# Patient Record
Sex: Female | Born: 1950
Health system: Southern US, Community
[De-identification: ages and names within clinical notes are randomized; demographics above are authoritative.]

## PROBLEM LIST (undated history)

## (undated) DIAGNOSIS — M069 Rheumatoid arthritis, unspecified: Secondary | ICD-10-CM

## (undated) DIAGNOSIS — E7800 Pure hypercholesterolemia, unspecified: Secondary | ICD-10-CM

## (undated) DIAGNOSIS — I639 Cerebral infarction, unspecified: Secondary | ICD-10-CM

## (undated) DIAGNOSIS — H547 Unspecified visual loss: Secondary | ICD-10-CM

## (undated) DIAGNOSIS — I10 Essential (primary) hypertension: Secondary | ICD-10-CM

## (undated) DIAGNOSIS — M797 Fibromyalgia: Secondary | ICD-10-CM

## (undated) DIAGNOSIS — E039 Hypothyroidism, unspecified: Secondary | ICD-10-CM

## (undated) DIAGNOSIS — M353 Polymyalgia rheumatica: Secondary | ICD-10-CM

## (undated) DIAGNOSIS — K219 Gastro-esophageal reflux disease without esophagitis: Secondary | ICD-10-CM

## (undated) DIAGNOSIS — C801 Malignant (primary) neoplasm, unspecified: Secondary | ICD-10-CM

## (undated) DIAGNOSIS — E119 Type 2 diabetes mellitus without complications: Secondary | ICD-10-CM

## (undated) DIAGNOSIS — G459 Transient cerebral ischemic attack, unspecified: Secondary | ICD-10-CM

## (undated) DIAGNOSIS — B029 Zoster without complications: Secondary | ICD-10-CM

## (undated) HISTORY — PX: JOINT REPLACEMENT: SHX530

## (undated) HISTORY — PX: CATARACT EXTRACTION W/ INTRAOCULAR LENS  IMPLANT, BILATERAL: SHX1307

## (undated) HISTORY — PX: OPEN REDUCTION LE FORT I FRACTURE: SHX2103

## (undated) HISTORY — PX: RHINOPLASTY: SUR1284

## (undated) HISTORY — DX: Cerebral infarction, unspecified: I63.9

## (undated) HISTORY — PX: VAGINAL HYSTERECTOMY: SUR661

---

## 1999-05-11 ENCOUNTER — Ambulatory Visit (HOSPITAL_COMMUNITY): Admission: RE | Admit: 1999-05-11 | Discharge: 1999-05-11 | Payer: Self-pay | Admitting: Specialist

## 1999-06-10 ENCOUNTER — Ambulatory Visit (HOSPITAL_COMMUNITY): Admission: RE | Admit: 1999-06-10 | Discharge: 1999-06-10 | Payer: Self-pay | Admitting: Specialist

## 2006-11-16 ENCOUNTER — Encounter: Admission: RE | Admit: 2006-11-16 | Discharge: 2006-11-16 | Payer: Self-pay | Admitting: Family Medicine

## 2011-06-04 ENCOUNTER — Emergency Department (HOSPITAL_COMMUNITY)
Admission: EM | Admit: 2011-06-04 | Discharge: 2011-06-04 | Disposition: A | Discharge status: Home / Self Care | Payer: Self-pay | Attending: Emergency Medicine | Admitting: Emergency Medicine

## 2011-06-04 ENCOUNTER — Inpatient Hospital Stay (INDEPENDENT_AMBULATORY_CARE_PROVIDER_SITE_OTHER)
Admission: RE | Admit: 2011-06-04 | Discharge: 2011-06-04 | Disposition: A | Discharge status: Home / Self Care | Payer: Self-pay | Source: Ambulatory Visit | Attending: Family Medicine | Admitting: Family Medicine

## 2011-06-04 DIAGNOSIS — I10 Essential (primary) hypertension: Secondary | ICD-10-CM

## 2011-06-04 DIAGNOSIS — IMO0001 Reserved for inherently not codable concepts without codable children: Secondary | ICD-10-CM

## 2011-06-04 DIAGNOSIS — M19019 Primary osteoarthritis, unspecified shoulder: Secondary | ICD-10-CM | POA: Insufficient documentation

## 2011-06-04 DIAGNOSIS — M25519 Pain in unspecified shoulder: Secondary | ICD-10-CM | POA: Insufficient documentation

## 2011-06-04 DIAGNOSIS — M25559 Pain in unspecified hip: Secondary | ICD-10-CM | POA: Insufficient documentation

## 2011-06-04 LAB — POCT URINALYSIS DIP (DEVICE)
Glucose, UA: NEGATIVE mg/dL
Ketones, ur: NEGATIVE mg/dL
Specific Gravity, Urine: 1.02 (ref 1.005–1.030)
Urobilinogen, UA: 0.2 mg/dL (ref 0.0–1.0)

## 2011-06-04 LAB — POCT I-STAT, CHEM 8
BUN: 14 mg/dL (ref 6–23)
Chloride: 106 mEq/L (ref 96–112)
Creatinine, Ser: 0.7 mg/dL (ref 0.50–1.10)
Glucose, Bld: 146 mg/dL — ABNORMAL HIGH (ref 70–99)
Potassium: 3.5 mEq/L (ref 3.5–5.1)

## 2011-07-23 ENCOUNTER — Other Ambulatory Visit: Payer: Self-pay | Admitting: Specialist

## 2011-07-23 DIAGNOSIS — H539 Unspecified visual disturbance: Secondary | ICD-10-CM

## 2011-07-28 ENCOUNTER — Other Ambulatory Visit: Payer: Self-pay

## 2011-08-04 ENCOUNTER — Ambulatory Visit
Admission: RE | Admit: 2011-08-04 | Discharge: 2011-08-04 | Disposition: A | Discharge status: Home / Self Care | Payer: No Typology Code available for payment source | Source: Ambulatory Visit | Attending: Specialist | Admitting: Specialist

## 2011-08-04 DIAGNOSIS — H539 Unspecified visual disturbance: Secondary | ICD-10-CM

## 2012-01-12 ENCOUNTER — Emergency Department (HOSPITAL_COMMUNITY)
Admission: EM | Admit: 2012-01-12 | Discharge: 2012-01-13 | Disposition: A | Discharge status: Home / Self Care | Payer: Self-pay | Attending: Emergency Medicine | Admitting: Emergency Medicine

## 2012-01-12 ENCOUNTER — Encounter (HOSPITAL_COMMUNITY): Payer: Self-pay | Admitting: Emergency Medicine

## 2012-01-12 DIAGNOSIS — R0609 Other forms of dyspnea: Secondary | ICD-10-CM | POA: Insufficient documentation

## 2012-01-12 DIAGNOSIS — R0600 Dyspnea, unspecified: Secondary | ICD-10-CM

## 2012-01-12 DIAGNOSIS — I10 Essential (primary) hypertension: Secondary | ICD-10-CM | POA: Insufficient documentation

## 2012-01-12 DIAGNOSIS — R0989 Other specified symptoms and signs involving the circulatory and respiratory systems: Secondary | ICD-10-CM | POA: Insufficient documentation

## 2012-01-12 HISTORY — DX: Zoster without complications: B02.9

## 2012-01-12 HISTORY — DX: Essential (primary) hypertension: I10

## 2012-01-12 NOTE — ED Notes (Signed)
PT. REPORTS SOB THIS EVENING WITH HYPERTENSION = 254/153 , DENIES CHEST PAIN OR NAUSEA.

## 2012-01-13 ENCOUNTER — Emergency Department (HOSPITAL_COMMUNITY): Payer: Self-pay

## 2012-01-13 LAB — CBC
HCT: 44.1 % (ref 36.0–46.0)
Hemoglobin: 14.6 g/dL (ref 12.0–15.0)
MCH: 28.3 pg (ref 26.0–34.0)
MCV: 85.6 fL (ref 78.0–100.0)
RBC: 5.15 MIL/uL — ABNORMAL HIGH (ref 3.87–5.11)
WBC: 15.1 10*3/uL — ABNORMAL HIGH (ref 4.0–10.5)

## 2012-01-13 LAB — BASIC METABOLIC PANEL
BUN: 12 mg/dL (ref 6–23)
CO2: 25 mEq/L (ref 19–32)
Calcium: 9.5 mg/dL (ref 8.4–10.5)
GFR calc non Af Amer: 90 mL/min — ABNORMAL LOW (ref >90)
Glucose, Bld: 210 mg/dL — ABNORMAL HIGH (ref 70–99)

## 2012-01-13 LAB — DIFFERENTIAL
Eosinophils Absolute: 0.1 10*3/uL (ref 0.0–0.7)
Eosinophils Relative: 1 % (ref 0–5)
Lymphocytes Relative: 21 % (ref 12–46)
Lymphs Abs: 3.2 10*3/uL (ref 0.7–4.0)
Monocytes Relative: 5 % (ref 3–12)

## 2012-01-13 MED ORDER — POTASSIUM CHLORIDE CRYS ER 20 MEQ PO TBCR
40.0000 meq | EXTENDED_RELEASE_TABLET | Freq: Once | ORAL | Status: AC
  Administered 2012-01-13: 40 meq via ORAL
  Filled 2012-01-13: qty 2

## 2012-01-13 NOTE — ED Notes (Signed)
Pt. Alert and oriented, respirations even and regular, pt. Discharged to home, NAD noted

## 2012-01-13 NOTE — Discharge Instructions (Signed)
Arterial Hypertension Arterial hypertension (high blood pressure) is a condition of elevated pressure in your blood vessels. Hypertension over a long period of time is a risk factor for strokes, heart attacks, and heart failure. It is also the leading cause of kidney (renal) failure.  CAUSES   In Adults -- Over 90% of all hypertension has no known cause. This is called essential or primary hypertension. In the other 10% of people with hypertension, the increase in blood pressure is caused by another disorder. This is called secondary hypertension. Important causes of secondary hypertension are:   Heavy alcohol use.   Obstructive sleep apnea.   Hyperaldosterosim (Conn's syndrome).   Steroid use.   Chronic kidney failure.   Hyperparathyroidism.   Medications.   Renal artery stenosis.   Pheochromocytoma.   Cushing's disease.   Coarctation of the aorta.   Scleroderma renal crisis.   Licorice (in excessive amounts).   Drugs (cocaine, methamphetamine).  Your caregiver can explain any items above that apply to you.  In Children -- Secondary hypertension is more common and should always be considered.   Pregnancy -- Few women of childbearing age have high blood pressure. However, up to 10% of them develop hypertension of pregnancy. Generally, this will not harm the woman. It may be a sign of 3 complications of pregnancy: preeclampsia, HELLP syndrome, and eclampsia. Follow up and control with medication is necessary.  SYMPTOMS   This condition normally does not produce any noticeable symptoms. It is usually found during a routine exam.   Malignant hypertension is a late problem of high blood pressure. It may have the following symptoms:   Headaches.   Blurred vision.   End-organ damage (this means your kidneys, heart, lungs, and other organs are being damaged).   Stressful situations can increase the blood pressure. If a person with normal blood pressure has their blood  pressure go up while being seen by their caregiver, this is often termed "white coat hypertension." Its importance is not known. It may be related with eventually developing hypertension or complications of hypertension.   Hypertension is often confused with mental tension, stress, and anxiety.  DIAGNOSIS  The diagnosis is made by 3 separate blood pressure measurements. They are taken at least 1 week apart from each other. If there is organ damage from hypertension, the diagnosis may be made without repeat measurements. Hypertension is usually identified by having blood pressure readings:  Above 140/90 mmHg measured in both arms, at 3 separate times, over a couple weeks.   Over 130/80 mmHg should be considered a risk factor and may require treatment in patients with diabetes.  Blood pressure readings over 120/80 mmHg are called "pre-hypertension" even in non-diabetic patients. To get a true blood pressure measurement, use the following guidelines. Be aware of the factors that can alter blood pressure readings.  Take measurements at least 1 hour after caffeine.   Take measurements 30 minutes after smoking and without any stress. This is another reason to quit smoking - it raises your blood pressure.   Use a proper cuff size. Ask your caregiver if you are not sure about your cuff size.   Most home blood pressure cuffs are automatic. They will measure systolic and diastolic pressures. The systolic pressure is the pressure reading at the start of sounds. Diastolic pressure is the pressure at which the sounds disappear. If you are elderly, measure pressures in multiple postures. Try sitting, lying or standing.   Sit at rest for a minimum of   5 minutes before taking measurements.   You should not be on any medications like decongestants. These are found in many cold medications.   Record your blood pressure readings and review them with your caregiver.  If you have hypertension:  Your caregiver  may do tests to be sure you do not have secondary hypertension (see "causes" above).   Your caregiver may also look for signs of metabolic syndrome. This is also called Syndrome X or Insulin Resistance Syndrome. You may have this syndrome if you have type 2 diabetes, abdominal obesity, and abnormal blood lipids in addition to hypertension.   Your caregiver will take your medical and family history and perform a physical exam.   Diagnostic tests may include blood tests (for glucose, cholesterol, potassium, and kidney function), a urinalysis, or an EKG. Other tests may also be necessary depending on your condition.  PREVENTION  There are important lifestyle issues that you can adopt to reduce your chance of developing hypertension:  Maintain a normal weight.   Limit the amount of salt (sodium) in your diet.   Exercise often.   Limit alcohol intake.   Get enough potassium in your diet. Discuss specific advice with your caregiver.   Follow a DASH diet (dietary approaches to stop hypertension). This diet is rich in fruits, vegetables, and low-fat dairy products, and avoids certain fats.  PROGNOSIS  Essential hypertension cannot be cured. Lifestyle changes and medical treatment can lower blood pressure and reduce complications. The prognosis of secondary hypertension depends on the underlying cause. Many people whose hypertension is controlled with medicine or lifestyle changes can live a normal, healthy life.  RISKS AND COMPLICATIONS  While high blood pressure alone is not an illness, it often requires treatment due to its short- and long-term effects on many organs. Hypertension increases your risk for:  CVAs or strokes (cerebrovascular accident).   Heart failure due to chronically high blood pressure (hypertensive cardiomyopathy).   Heart attack (myocardial infarction).   Damage to the retina (hypertensive retinopathy).   Kidney failure (hypertensive nephropathy).  Your caregiver can  explain list items above that apply to you. Treatment of hypertension can significantly reduce the risk of complications. TREATMENT   For overweight patients, weight loss and regular exercise are recommended. Physical fitness lowers blood pressure.   Mild hypertension is usually treated with diet and exercise. A diet rich in fruits and vegetables, fat-free dairy products, and foods low in fat and salt (sodium) can help lower blood pressure. Decreasing salt intake decreases blood pressure in a 1/3 of people.   Stop smoking if you are a smoker.  The steps above are highly effective in reducing blood pressure. While these actions are easy to suggest, they are difficult to achieve. Most patients with moderate or severe hypertension end up requiring medications to bring their blood pressure down to a normal level. There are several classes of medications for treatment. Blood pressure pills (antihypertensives) will lower blood pressure by their different actions. Lowering the blood pressure by 10 mmHg may decrease the risk of complications by as much as 25%. The goal of treatment is effective blood pressure control. This will reduce your risk for complications. Your caregiver will help you determine the best treatment for you according to your lifestyle. What is excellent treatment for one person, may not be for you. HOME CARE INSTRUCTIONS   Do not smoke.   Follow the lifestyle changes outlined in the "Prevention" section.   If you are on medications, follow the directions   carefully. Blood pressure medications must be taken as prescribed. Skipping doses reduces their benefit. It also puts you at risk for problems.   Follow up with your caregiver, as directed.   If you are asked to monitor your blood pressure at home, follow the guidelines in the "Diagnosis" section above.  SEEK MEDICAL CARE IF:   You think you are having medication side effects.   You have recurrent headaches or lightheadedness.     You have swelling in your ankles.   You have trouble with your vision.  SEEK IMMEDIATE MEDICAL CARE IF:   You have sudden onset of chest pain or pressure, difficulty breathing, or other symptoms of a heart attack.   You have a severe headache.   You have symptoms of a stroke (such as sudden weakness, difficulty speaking, difficulty walking).  MAKE SURE YOU:   Understand these instructions.   Will watch your condition.   Will get help right away if you are not doing well or get worse.  Document Released: 07/26/2005 Document Revised: 07/15/2011 Document Reviewed: 02/23/2007 [employer] Patient Information 2012 [employer]. 

## 2012-01-13 NOTE — ED Provider Notes (Addendum)
History     CSN: [id number]  Arrival date & time 01/12/12  2341   First MD Initiated Contact with Patient 01/13/12 0124      Chief Complaint  Patient presents with  . Shortness of Breath    (Consider location/radiation/quality/duration/timing/severity/associated sxs/prior treatment) Patient is a 61 y.o. female presenting with shortness of breath. The history is provided by the patient.  Shortness of Breath  Associated symptoms include shortness of breath.   patient here with shortness of breath and high blood pressure is that this evening. She has been having allergic reaction to Vicodin with pruritus. She took her blood pressure at home at systolic was 250. As her blood pressure decreased she felt better. Denies any chest pain or chest pressure. No diaphoresis. No recent travel history or leg pain. No recent history of fevers. She feels back to her baseline at this time  Past Medical History  Diagnosis Date  . Hypertension   . Shingles     Past Surgical History  Procedure Date  . Abdominal hysterectomy   . Rhinoplasty     No family history on file.  History  Substance Use Topics  . Smoking status: Current Everyday Smoker  . Smokeless tobacco: Not on file  . Alcohol Use: No    OB History    Grav Para Term Preterm Abortions TAB SAB Ect Mult Living                  Review of Systems  Respiratory: Positive for shortness of breath.   All other systems reviewed and are negative.    Allergies  Review of patient's allergies indicates not on file.  Home Medications  No current outpatient prescriptions on file.  BP 194/97  Pulse 100  Temp(Src) 98.4 F (36.9 C) (Oral)  Resp 18  SpO2 97%  Physical Exam  Nursing note and vitals reviewed. Constitutional: She is oriented to person, place, and time. She appears well-developed and well-nourished.  Non-toxic appearance. No distress.  HENT:  Head: Normocephalic and atraumatic.  Eyes: Conjunctivae, EOM and lids are  normal. Pupils are equal, round, and reactive to light.  Neck: Normal range of motion. Neck supple. No tracheal deviation present. No mass present.  Cardiovascular: Normal rate, regular rhythm and normal heart sounds.  Exam reveals no gallop.   No murmur heard. Pulmonary/Chest: Effort normal and breath sounds normal. No stridor. No respiratory distress. She has no decreased breath sounds. She has no wheezes. She has no rhonchi. She has no rales.  Abdominal: Soft. Normal appearance and bowel sounds are normal. She exhibits no distension. There is no tenderness. There is no rebound and no CVA tenderness.  Musculoskeletal: Normal range of motion. She exhibits no edema and no tenderness.  Neurological: She is alert and oriented to person, place, and time. She has normal strength. No cranial nerve deficit or sensory deficit. GCS eye subscore is 4. GCS verbal subscore is 5. GCS motor subscore is 6.  Skin: Skin is warm and dry. No abrasion and no rash noted.  Psychiatric: She has a normal mood and affect. Her speech is normal and behavior is normal.    ED Course  Procedures (including critical care time)  Labs Reviewed  CBC - Abnormal; Notable for the following:    WBC 15.1 (*)    RBC 5.15 (*)    All other components within normal limits  DIFFERENTIAL - Abnormal; Notable for the following:    Neutro Abs 11.0 (*)    All  other components within normal limits  BASIC METABOLIC PANEL - Abnormal; Notable for the following:    Potassium 3.1 (*)    Glucose, Bld 210 (*)    GFR calc non Af Amer 90 (*)    All other components within normal limits  PRO B NATRIURETIC PEPTIDE   Dg Chest 2 View  01/13/2012  *RADIOLOGY REPORT*  Clinical Data: Shortness of breath.  CHEST - 2 VIEW  Comparison: No priors.  Findings: Lungs appear borderline hyperexpanded. No consolidative airspace disease.   Mild pulmonary venous congestion without frank pulmonary edema.  Heart size is within normal limits. Atherosclerotic  calcifications within the arch of the aorta.  No definite pleural effusions.  IMPRESSION: 1.  Mild pulmonary venous congestion without frank pulmonary edema. 2.  Atherosclerosis.  Original Report Authenticated By: Florencia Reasons, M.D.     No diagnosis found.    MDM  Suspect that patient had transient increase in her blood pressure and this was the cause of her shortness of breath which has since resolved. No concern for pulmonary embolism. She is stable for discharged    Date: 04/08/2012  Rate: 102  Rhythm: sinus tachycardia  QRS Axis: normal  Intervals: normal  ST/T Wave abnormalities: nonspecific ST changes  Conduction Disutrbances:none  Narrative Interpretation:   Old EKG Reviewed: unchanged       Toy Baker, MD 01/13/12 1610  Toy Baker, MD 04/08/12 2337

## 2015-08-13 NOTE — H&P (Signed)
TOTAL KNEE ADMISSION H&P  Patient is being admitted for right total knee arthroplasty.  Subjective:  Chief Complaint:   Right knee primary OA / pain  HPI: Misty Morris, 65 y.o. female, has a history of pain and functional disability in the right knee due to arthritis and has failed non-surgical conservative treatments for greater than 12 weeks to includeNSAID's and/or analgesics, corticosteriod injections, viscosupplementation injections, use of assistive devices and activity modification.  Onset of symptoms was gradual, starting 1.5+ years ago with gradually worsening course since that time. The patient noted no past surgery on the right knee(s).  Patient currently rates pain in the right knee(s) at 9 out of 10 with activity. Patient has night pain, worsening of pain with activity and weight bearing, pain that interferes with activities of daily living, pain with passive range of motion, crepitus and joint swelling.  Patient has evidence of periarticular osteophytes and joint space narrowing by imaging studies.  There is no active infection.   Risks, benefits and expectations were discussed with the patient.  Risks including but not limited to the risk of anesthesia, blood clots, nerve damage, blood vessel damage, failure of the prosthesis, infection and up to and including death.  Patient understand the risks, benefits and expectations and wishes to proceed with surgery.   PCP: Donnie Coffin, MD  D/C Plans:      Home with HHPT  (mother's house)  Post-op Meds:       No Rx given  Tranexamic Acid:      To be given - IV   Decadron:      Is to be given  FYI:     ASA  Norco  Dilaudid IV  (ok per the patient)    Past Medical History  Diagnosis Date  . Hypertension   . Shingles     Past Surgical History  Procedure Laterality Date  . Abdominal hysterectomy    . Rhinoplasty      No prescriptions prior to admission   Allergies  Allergen Reactions  . Latex Other (See Comments)   Blistering   . Codeine Hives  . Demerol [Meperidine] Hives  . Morphine And Related Hives  . Sulfa Antibiotics Hives  . Vicodin [Hydrocodone-Acetaminophen] Itching    Elevated blood pressure    Social History  Substance Use Topics  . Smoking status: Current Every Day Smoker  . Smokeless tobacco: Not on file  . Alcohol Use: No       Review of Systems  Constitutional: Positive for malaise/fatigue.  HENT: Negative.   Eyes: Negative.   Respiratory: Negative.   Cardiovascular: Negative.   Gastrointestinal: Negative.   Genitourinary: Negative.   Musculoskeletal: Positive for myalgias and joint pain.  Skin: Negative.   Neurological: Negative.   Endo/Heme/Allergies: Negative.   Psychiatric/Behavioral: Negative.     Objective:  Physical Exam  Constitutional: She is oriented to person, place, and time. She appears well-developed.  HENT:  Head: Normocephalic.  Mouth/Throat: She has dentures.  Eyes: Pupils are equal, round, and reactive to light.  Neck: Neck supple. No JVD present. No tracheal deviation present. No thyromegaly present.  Cardiovascular: Normal rate, regular rhythm, normal heart sounds and intact distal pulses.   Respiratory: Effort normal and breath sounds normal. No stridor. No respiratory distress. She has no wheezes.  GI: Soft. There is no tenderness. There is no guarding.  Musculoskeletal:       Right knee: She exhibits decreased range of motion, swelling and bony tenderness. She exhibits no  ecchymosis, no deformity, no laceration and no erythema. Tenderness found.  Lymphadenopathy:    She has no cervical adenopathy.  Neurological: She is alert and oriented to person, place, and time.  Skin: Skin is warm and dry.  Psychiatric: She has a normal mood and affect.      Imaging Review Plain radiographs demonstrate severe degenerative joint disease of the right knee(s). The overall alignment is mild varus. The bone quality appears to be good for age and  reported activity level.  Assessment/Plan:  End stage arthritis, right knee   The patient history, physical examination, clinical judgment of the provider and imaging studies are consistent with end stage degenerative joint disease of the right knee(s) and total knee arthroplasty is deemed medically necessary. The treatment options including medical management, injection therapy arthroscopy and arthroplasty were discussed at length. The risks and benefits of total knee arthroplasty were presented and reviewed. The risks due to aseptic loosening, infection, stiffness, patella tracking problems, thromboembolic complications and other imponderables were discussed. The patient acknowledged the explanation, agreed to proceed with the plan and consent was signed. Patient is being admitted for inpatient treatment for surgery, pain control, PT, OT, prophylactic antibiotics, VTE prophylaxis, progressive ambulation and ADL's and discharge planning. The patient is planning to be discharged home with home health services.      [place] P[place] Wali Reinheimer   PA-C  08/13/2015, 5:47 PM

## 2015-08-26 NOTE — Patient Instructions (Addendum)
Misty Morris  08/26/2015   Your procedure is scheduled on: Tuesday 09/02/2015  Report to [facility] Main  Entrance take [place]  elevators to 3rd floor to  [place] at  [phone]  AM.  Call this number if you have problems the morning of surgery [phone]   Remember: ONLY 1 PERSON MAY GO WITH YOU TO SHORT STAY TO GET  READY MORNING OF Misty Morris.   Do not eat food or drink liquids :After Midnight.     Take these medicines the morning of surgery with A SIP OF WATER: Prednisone, use eye drops                                 You may not have any metal on your body including hair pins and              piercings  Do not wear jewelry, make-up, lotions, powders or perfumes, deodorant             Do not wear nail polish.  Do not shave  48 hours prior to surgery.              Men may shave face and neck.   Do not bring valuables to the hospital. Misty Morris.  Contacts, dentures or bridgework may not be worn into surgery.  Leave suitcase in the car. After surgery it may be brought to your room.     Patients discharged the day of surgery will not be allowed to drive home.  Name and phone number of your driver:  Special Instructions: N/A              Please read over the following fact sheets you were given: _____________________________________________________________________             [facility] - Preparing for Surgery Before surgery, you can play an important role.  Because skin is not sterile, your skin needs to be as free of germs as possible.  You can reduce the number of germs on your skin by washing with CHG (chlorahexidine gluconate) soap before surgery.  CHG is an antiseptic cleaner which kills germs and bonds with the skin to continue killing germs even after washing. Please DO NOT use if you have an allergy to CHG or antibacterial soaps.  If your skin becomes reddened/irritated stop using the  CHG and inform your nurse when you arrive at Short Stay. Do not shave (including legs and underarms) for at least 48 hours prior to the first CHG shower.  You may shave your face/neck. Please follow these instructions carefully:  1.  Shower with CHG Soap the night before surgery and the  morning of Surgery.  2.  If you choose to wash your hair, wash your hair first as usual with your  normal  shampoo.  3.  After you shampoo, rinse your hair and body thoroughly to remove the  shampoo.                           4.  Use CHG as you would any other liquid soap.  You can apply chg directly  to the skin and wash  Gently with a scrungie or clean washcloth.  5.  Apply the CHG Soap to your body ONLY FROM THE NECK DOWN.   Do not use on face/ open                           Wound or open sores. Avoid contact with eyes, ears mouth and genitals (private parts).                       Wash face,  Genitals (private parts) with your normal soap.             6.  Wash thoroughly, paying special attention to the area where your surgery  will be performed.  7.  Thoroughly rinse your body with warm water from the neck down.  8.  DO NOT shower/wash with your normal soap after using and rinsing off  the CHG Soap.                9.  Pat yourself dry with a clean towel.            10.  Wear clean pajamas.            11.  Place clean sheets on your bed the night of your first shower and do not  sleep with pets. Day of Surgery : Do not apply any lotions/deodorants the morning of surgery.  Please wear clean clothes to the hospital/surgery center.  FAILURE TO FOLLOW THESE INSTRUCTIONS MAY RESULT IN THE CANCELLATION OF YOUR SURGERY PATIENT SIGNATURE_________________________________  NURSE SIGNATURE__________________________________  ________________________________________________________________________   Misty Morris  An incentive spirometer is a tool that can help keep your lungs clear  and active. This tool measures how well you are filling your lungs with each breath. Taking long deep breaths may help reverse or decrease the chance of developing breathing (pulmonary) problems (especially infection) following:  A long period of time when you are unable to move or be active. BEFORE THE PROCEDURE   If the spirometer includes an indicator to show your best effort, your nurse or respiratory therapist will set it to a desired goal.  If possible, sit up straight or lean slightly forward. Try not to slouch.  Hold the incentive spirometer in an upright position. INSTRUCTIONS FOR USE   Sit on the edge of your bed if possible, or sit up as far as you can in bed or on a chair.  Hold the incentive spirometer in an upright position.  Breathe out normally.  Place the mouthpiece in your mouth and seal your lips tightly around it.  Breathe in slowly and as deeply as possible, raising the piston or the ball toward the top of the column.  Hold your breath for 3-5 seconds or for as long as possible. Allow the piston or ball to fall to the bottom of the column.  Remove the mouthpiece from your mouth and breathe out normally.  Rest for a few seconds and repeat Steps 1 through 7 at least 10 times every 1-2 hours when you are awake. Take your time and take a few normal breaths between deep breaths.  The spirometer may include an indicator to show your best effort. Use the indicator as a goal to work toward during each repetition.  After each set of 10 deep breaths, practice coughing to be sure your lungs are clear. If you have an incision (the cut made at the time of surgery),  support your incision when coughing by placing a pillow or rolled up towels firmly against it. Once you are able to get out of bed, walk around indoors and cough well. You may stop using the incentive spirometer when instructed by your caregiver.  RISKS AND COMPLICATIONS  Take your time so you do not get dizzy or  light-headed.  If you are in pain, you may need to take or ask for pain medication before doing incentive spirometry. It is harder to take a deep breath if you are having pain. AFTER USE  Rest and breathe slowly and easily.  It can be helpful to keep track of a log of your progress. Your caregiver can provide you with a simple table to help with this. If you are using the spirometer at home, follow these instructions: [place] IF:   You are having difficultly using the spirometer.  You have trouble using the spirometer as often as instructed.  Your pain medication is not giving enough relief while using the spirometer.  You develop fever of 100.5 F (38.1 C) or higher. SEEK IMMEDIATE MEDICAL CARE IF:   You cough up bloody sputum that had not been present before.  You develop fever of 102 F (38.9 C) or greater.  You develop worsening pain at or near the incision site. MAKE SURE YOU:   Understand these instructions.  Will watch your condition.  Will get help right away if you are not doing well or get worse. Document Released: 12/06/2006 Document Revised: 10/18/2011 Document Reviewed: 02/06/2007 Exit[employer] Patient Information 2014 Exit[employer], [place].   ________________________________________________________________________  WHAT IS A BLOOD TRANSFUSION? Blood Transfusion Information  A transfusion is the replacement of blood or some of its parts. Blood is made up of multiple cells which provide different functions.  Red blood cells carry oxygen and are used for blood loss replacement.  White blood cells fight against infection.  Platelets control bleeding.  Plasma helps clot blood.  Other blood products are available for specialized needs, such as hemophilia or other clotting disorders. BEFORE THE TRANSFUSION  Who gives blood for transfusions?   Healthy volunteers who are fully evaluated to make sure their blood is safe. This is blood bank  blood. Transfusion therapy is the safest it has ever been in the practice of medicine. Before blood is taken from a donor, a complete history is taken to make sure that person has no history of diseases nor engages in risky social behavior (examples are intravenous drug use or sexual activity with multiple partners). The donor's travel history is screened to minimize risk of transmitting infections, such as malaria. The donated blood is tested for signs of infectious diseases, such as HIV and hepatitis. The blood is then tested to be sure it is compatible with you in order to minimize the chance of a transfusion reaction. If you or a relative donates blood, this is often done in anticipation of surgery and is not appropriate for emergency situations. It takes many days to process the donated blood. RISKS AND COMPLICATIONS Although transfusion therapy is very safe and saves many lives, the main dangers of transfusion include:   Getting an infectious disease.  Developing a transfusion reaction. This is an allergic reaction to something in the blood you were given. Every precaution is taken to prevent this. The decision to have a blood transfusion has been considered carefully by your caregiver before blood is given. Blood is not given unless the benefits outweigh the risks. AFTER THE TRANSFUSION  Right after receiving a blood transfusion, you will usually feel much better and more energetic. This is especially true if your red blood cells have gotten low (anemic). The transfusion raises the level of the red blood cells which carry oxygen, and this usually causes an energy increase.  The nurse administering the transfusion will monitor you carefully for complications. HOME CARE INSTRUCTIONS  No special instructions are needed after a transfusion. You may find your energy is better. Speak with your caregiver about any limitations on activity for underlying diseases you may have. SEEK MEDICAL CARE IF:    Your condition is not improving after your transfusion.  You develop redness or irritation at the intravenous (IV) site. SEEK IMMEDIATE MEDICAL CARE IF:  Any of the following symptoms occur over the next 12 hours:  Shaking chills.  You have a temperature by mouth above 102 F (38.9 C), not controlled by medicine.  Chest, back, or muscle pain.  People around you feel you are not acting correctly or are confused.  Shortness of breath or difficulty breathing.  Dizziness and fainting.  You get a rash or develop hives.  You have a decrease in urine output.  Your urine turns a dark color or changes to pink, red, or brown. Any of the following symptoms occur over the next 10 days:  You have a temperature by mouth above 102 F (38.9 C), not controlled by medicine.  Shortness of breath.  Weakness after normal activity.  The white part of the eye turns yellow (jaundice).  You have a decrease in the amount of urine or are urinating less often.  Your urine turns a dark color or changes to pink, red, or brown. Document Released: 07/23/2000 Document Revised: 10/18/2011 Document Reviewed: 03/11/2008 [facility] Patient Information 2014 [place], [place].  _______________________________________________________________________

## 2015-08-27 ENCOUNTER — Encounter (HOSPITAL_COMMUNITY)
Admission: RE | Admit: 2015-08-27 | Discharge: 2015-08-27 | Disposition: A | Discharge status: Home / Self Care | Payer: BLUE CROSS/BLUE SHIELD | Source: Ambulatory Visit | Attending: Orthopedic Surgery | Admitting: Orthopedic Surgery

## 2015-08-27 ENCOUNTER — Encounter (HOSPITAL_COMMUNITY): Payer: Self-pay

## 2015-08-27 DIAGNOSIS — Z0183 Encounter for blood typing: Secondary | ICD-10-CM | POA: Insufficient documentation

## 2015-08-27 DIAGNOSIS — M1711 Unilateral primary osteoarthritis, right knee: Secondary | ICD-10-CM | POA: Insufficient documentation

## 2015-08-27 DIAGNOSIS — Z01812 Encounter for preprocedural laboratory examination: Secondary | ICD-10-CM | POA: Insufficient documentation

## 2015-08-27 HISTORY — DX: Gastro-esophageal reflux disease without esophagitis: K21.9

## 2015-08-27 LAB — URINE MICROSCOPIC-ADD ON

## 2015-08-27 LAB — BASIC METABOLIC PANEL
Anion gap: 9 (ref 5–15)
BUN: 17 mg/dL (ref 6–20)
CO2: 29 mmol/L (ref 22–32)
Calcium: 9.9 mg/dL (ref 8.9–10.3)
Chloride: 106 mmol/L (ref 101–111)
Creatinine, Ser: 0.85 mg/dL (ref 0.44–1.00)
GFR calc Af Amer: >60 mL/min (ref >60)
GLUCOSE: 210 mg/dL — AB (ref 65–99)
POTASSIUM: 4.3 mmol/L (ref 3.5–5.1)
Sodium: 144 mmol/L (ref 135–145)

## 2015-08-27 LAB — CBC
HEMATOCRIT: 41.1 % (ref 36.0–46.0)
Hemoglobin: 13.2 g/dL (ref 12.0–15.0)
MCH: 27.8 pg (ref 26.0–34.0)
MCHC: 32.1 g/dL (ref 30.0–36.0)
MCV: 86.5 fL (ref 78.0–100.0)
Platelets: 269 10*3/uL (ref 150–400)
RBC: 4.75 MIL/uL (ref 3.87–5.11)
RDW: 14.8 % (ref 11.5–15.5)
WBC: 11.4 10*3/uL — ABNORMAL HIGH (ref 4.0–10.5)

## 2015-08-27 LAB — URINALYSIS, ROUTINE W REFLEX MICROSCOPIC
BILIRUBIN URINE: NEGATIVE
Glucose, UA: 100 mg/dL — AB
Hgb urine dipstick: NEGATIVE
Ketones, ur: NEGATIVE mg/dL
Leukocytes, UA: NEGATIVE
NITRITE: NEGATIVE
Protein, ur: 30 mg/dL — AB
SPECIFIC GRAVITY, URINE: 1.037 — AB (ref 1.005–1.030)
pH: 5.5 (ref 5.0–8.0)

## 2015-08-27 LAB — APTT: APTT: 25 s (ref 24–37)

## 2015-08-27 LAB — SURGICAL PCR SCREEN
MRSA, PCR: NEGATIVE
Staphylococcus aureus: NEGATIVE

## 2015-08-27 LAB — PROTIME-INR
INR: 1 (ref 0.00–1.49)
Prothrombin Time: 13.4 seconds (ref 11.6–15.2)

## 2015-08-27 LAB — ABO/RH: ABO/RH(D): O POS

## 2015-08-28 LAB — HEMOGLOBIN A1C
HEMOGLOBIN A1C: 7.7 % — AB (ref 4.8–5.6)
MEAN PLASMA GLUCOSE: 174 mg/dL

## 2015-08-28 NOTE — Progress Notes (Signed)
08-28-15 1225 Note Hgb A1C level viewable in Epic- pt is known Diabetic

## 2015-08-29 NOTE — Progress Notes (Signed)
Patient called and notified change for surgery 09/02/2015. Patient informed to arrive at Short Stay at [facility] on 09/02/2015 . Patient verbalized understanding.

## 2015-08-29 NOTE — Progress Notes (Signed)
Faxed note placed on chart from Dr. Alvan Dame from his office for patient to be on antibiotics  For UTI.

## 2015-09-02 ENCOUNTER — Encounter (HOSPITAL_COMMUNITY): Payer: Self-pay | Admitting: Anesthesiology

## 2015-09-02 ENCOUNTER — Inpatient Hospital Stay (HOSPITAL_COMMUNITY)
Admission: RE | Admit: 2015-09-02 | Payer: BLUE CROSS/BLUE SHIELD | Source: Ambulatory Visit | Admitting: Orthopedic Surgery

## 2015-09-02 ENCOUNTER — Encounter (HOSPITAL_COMMUNITY): Admission: RE | Payer: Self-pay | Source: Ambulatory Visit

## 2015-09-02 LAB — TYPE AND SCREEN
ABO/RH(D): O POS
Antibody Screen: NEGATIVE

## 2015-09-02 SURGERY — ARTHROPLASTY, KNEE, TOTAL
Anesthesia: Spinal | Site: Knee | Laterality: Right

## 2015-09-23 ENCOUNTER — Encounter (HOSPITAL_COMMUNITY): Payer: Self-pay

## 2015-09-23 ENCOUNTER — Encounter (HOSPITAL_COMMUNITY)
Admission: RE | Admit: 2015-09-23 | Discharge: 2015-09-23 | Disposition: A | Discharge status: Home / Self Care | Payer: Medicare Other | Source: Ambulatory Visit | Attending: Orthopedic Surgery | Admitting: Orthopedic Surgery

## 2015-09-23 DIAGNOSIS — Z01812 Encounter for preprocedural laboratory examination: Secondary | ICD-10-CM | POA: Diagnosis not present

## 2015-09-23 DIAGNOSIS — Z01818 Encounter for other preprocedural examination: Secondary | ICD-10-CM | POA: Insufficient documentation

## 2015-09-23 DIAGNOSIS — Z0183 Encounter for blood typing: Secondary | ICD-10-CM | POA: Diagnosis not present

## 2015-09-23 DIAGNOSIS — M1711 Unilateral primary osteoarthritis, right knee: Secondary | ICD-10-CM | POA: Insufficient documentation

## 2015-09-23 DIAGNOSIS — R9431 Abnormal electrocardiogram [ECG] [EKG]: Secondary | ICD-10-CM | POA: Diagnosis not present

## 2015-09-23 LAB — BASIC METABOLIC PANEL
ANION GAP: 10 (ref 5–15)
BUN: 15 mg/dL (ref 6–20)
CALCIUM: 9.6 mg/dL (ref 8.9–10.3)
CO2: 27 mmol/L (ref 22–32)
Chloride: 106 mmol/L (ref 101–111)
Creatinine, Ser: 0.67 mg/dL (ref 0.44–1.00)
GLUCOSE: 173 mg/dL — AB (ref 65–99)
POTASSIUM: 3.6 mmol/L (ref 3.5–5.1)
SODIUM: 143 mmol/L (ref 135–145)

## 2015-09-23 LAB — PROTIME-INR
INR: 0.99 (ref 0.00–1.49)
PROTHROMBIN TIME: 13.3 s (ref 11.6–15.2)

## 2015-09-23 LAB — URINALYSIS, ROUTINE W REFLEX MICROSCOPIC
Bilirubin Urine: NEGATIVE
Glucose, UA: 100 mg/dL — AB
Hgb urine dipstick: NEGATIVE
Ketones, ur: NEGATIVE mg/dL
LEUKOCYTES UA: NEGATIVE
NITRITE: NEGATIVE
Protein, ur: NEGATIVE mg/dL
SPECIFIC GRAVITY, URINE: 1.025 (ref 1.005–1.030)
pH: 6 (ref 5.0–8.0)

## 2015-09-23 LAB — SURGICAL PCR SCREEN
MRSA, PCR: NEGATIVE
STAPHYLOCOCCUS AUREUS: NEGATIVE

## 2015-09-23 LAB — CBC
HEMATOCRIT: 41.7 % (ref 36.0–46.0)
HEMOGLOBIN: 13.8 g/dL (ref 12.0–15.0)
MCH: 28.6 pg (ref 26.0–34.0)
MCHC: 33.1 g/dL (ref 30.0–36.0)
MCV: 86.3 fL (ref 78.0–100.0)
Platelets: 252 10*3/uL (ref 150–400)
RBC: 4.83 MIL/uL (ref 3.87–5.11)
RDW: 14.5 % (ref 11.5–15.5)
WBC: 12.3 10*3/uL — AB (ref 4.0–10.5)

## 2015-09-23 LAB — APTT: APTT: 24 s (ref 24–37)

## 2015-09-23 NOTE — Progress Notes (Addendum)
CBC results done 09/23/15 faxed via EPIC to Dr Alvan Dame. U/A results done 09/23/15 faxed via EPIC to Dr Alvan Dame.

## 2015-09-23 NOTE — Progress Notes (Signed)
Clearance- DR Svad-05/08/15 on chart  Clearance- DR Alroy Dust- 03/03/15- on chart

## 2015-09-23 NOTE — Progress Notes (Signed)
HgA1C done 08/27/15- EPIC

## 2015-09-23 NOTE — Patient Instructions (Signed)
MISHELLE HRONCICH  09/23/2015   Your procedure is scheduled on: 09/30/2015    Report to [facility] Main  Entrance take [place]  elevators to 3rd floor to  [place] at    0700 AM.  Call this number if you have problems the morning of surgery [phone]   Remember: ONLY 1 PERSON MAY GO WITH YOU TO SHORT STAY TO GET  READY MORNING OF [place].  Do not eat food or drink liquids :After Midnight.     Take these medicines the morning of surgery with A SIP OF WATER: Eye drops as usual  DO NOT TAKE ANY DIABETIC MEDICATIONS DAY OF YOUR SURGERY                               You may not have any metal on your body including hair pins and              piercings  Do not wear jewelry, make-up, lotions, powders or perfumes, deodorant             Do not wear nail polish.  Do not shave  48 hours prior to surgery.              Do not bring valuables to the hospital. [place].  Contacts, dentures or bridgework may not be worn into surgery.  Leave suitcase in the car. After surgery it may be brought to your room.        Special Instructions: coughing and deep breathing exercises, leg exercises               Please read over the following fact sheets you were given: _____________________________________________________________________             [facility] - Preparing for Surgery Before surgery, you can play an important role.  Because skin is not sterile, your skin needs to be as free of germs as possible.  You can reduce the number of germs on your skin by washing with CHG (chlorahexidine gluconate) soap before surgery.  CHG is an antiseptic cleaner which kills germs and bonds with the skin to continue killing germs even after washing. Please DO NOT use if you have an allergy to CHG or antibacterial soaps.  If your skin becomes reddened/irritated stop using the CHG and inform your nurse when you arrive at Short  Stay. Do not shave (including legs and underarms) for at least 48 hours prior to the first CHG shower.  You may shave your face/neck. Please follow these instructions carefully:  1.  Shower with CHG Soap the night before surgery and the  morning of Surgery.  2.  If you choose to wash your hair, wash your hair first as usual with your  normal  shampoo.  3.  After you shampoo, rinse your hair and body thoroughly to remove the  shampoo.                           4.  Use CHG as you would any other liquid soap.  You can apply chg directly  to the skin and wash  Gently with a scrungie or clean washcloth.  5.  Apply the CHG Soap to your body ONLY FROM THE NECK DOWN.   Do not use on face/ open                           Wound or open sores. Avoid contact with eyes, ears mouth and genitals (private parts).                       Wash face,  Genitals (private parts) with your normal soap.             6.  Wash thoroughly, paying special attention to the area where your surgery  will be performed.  7.  Thoroughly rinse your body with warm water from the neck down.  8.  DO NOT shower/wash with your normal soap after using and rinsing off  the CHG Soap.                9.  Pat yourself dry with a clean towel.            10.  Wear clean pajamas.            11.  Place clean sheets on your bed the night of your first shower and do not  sleep with pets. Day of Surgery : Do not apply any lotions/deodorants the morning of surgery.  Please wear clean clothes to the hospital/surgery center.  FAILURE TO FOLLOW THESE INSTRUCTIONS MAY RESULT IN THE CANCELLATION OF YOUR SURGERY PATIENT SIGNATURE_________________________________  NURSE SIGNATURE__________________________________  ________________________________________________________________________  WHAT IS A BLOOD TRANSFUSION? Blood Transfusion Information  A transfusion is the replacement of blood or some of its parts. Blood is made up of  multiple cells which provide different functions.  Red blood cells carry oxygen and are used for blood loss replacement.  White blood cells fight against infection.  Platelets control bleeding.  Plasma helps clot blood.  Other blood products are available for specialized needs, such as hemophilia or other clotting disorders. BEFORE THE TRANSFUSION  Who gives blood for transfusions?   Healthy volunteers who are fully evaluated to make sure their blood is safe. This is blood bank blood. Transfusion therapy is the safest it has ever been in the practice of medicine. Before blood is taken from a donor, a complete history is taken to make sure that person has no history of diseases nor engages in risky social behavior (examples are intravenous drug use or sexual activity with multiple partners). The donor's travel history is screened to minimize risk of transmitting infections, such as malaria. The donated blood is tested for signs of infectious diseases, such as HIV and hepatitis. The blood is then tested to be sure it is compatible with you in order to minimize the chance of a transfusion reaction. If you or a relative donates blood, this is often done in anticipation of surgery and is not appropriate for emergency situations. It takes many days to process the donated blood. RISKS AND COMPLICATIONS Although transfusion therapy is very safe and saves many lives, the main dangers of transfusion include:  1. Getting an infectious disease. 2. Developing a transfusion reaction. This is an allergic reaction to something in the blood you were given. Every precaution is taken to prevent this. The decision to have a blood transfusion has been considered carefully by your caregiver before blood is given. Blood is not given unless the benefits outweigh  the risks. AFTER THE TRANSFUSION  Right after receiving a blood transfusion, you will usually feel much better and more energetic. This is especially true if  your red blood cells have gotten low (anemic). The transfusion raises the level of the red blood cells which carry oxygen, and this usually causes an energy increase.  The nurse administering the transfusion will monitor you carefully for complications. HOME CARE INSTRUCTIONS  No special instructions are needed after a transfusion. You may find your energy is better. Speak with your caregiver about any limitations on activity for underlying diseases you may have. SEEK MEDICAL CARE IF:   Your condition is not improving after your transfusion.  You develop redness or irritation at the intravenous (IV) site. SEEK IMMEDIATE MEDICAL CARE IF:  Any of the following symptoms occur over the next 12 hours:  Shaking chills.  You have a temperature by mouth above 102 F (38.9 C), not controlled by medicine.  Chest, back, or muscle pain.  People around you feel you are not acting correctly or are confused.  Shortness of breath or difficulty breathing.  Dizziness and fainting.  You get a rash or develop hives.  You have a decrease in urine output.  Your urine turns a dark color or changes to pink, red, or brown. Any of the following symptoms occur over the next 10 days:  You have a temperature by mouth above 102 F (38.9 C), not controlled by medicine.  Shortness of breath.  Weakness after normal activity.  The white part of the eye turns yellow (jaundice).  You have a decrease in the amount of urine or are urinating less often.  Your urine turns a dark color or changes to pink, red, or brown. Document Released: 07/23/2000 Document Revised: 10/18/2011 Document Reviewed: 03/11/2008 Exit[employer] Patient Information 2014 [place].  _______________________________________________________________________  Incentive Spirometer  An incentive spirometer is a tool that can help keep your lungs clear and active. This tool measures how well you are filling your lungs with each breath.  Taking long deep breaths may help reverse or decrease the chance of developing breathing (pulmonary) problems (especially infection) following:  A long period of time when you are unable to move or be active. BEFORE THE PROCEDURE   If the spirometer includes an indicator to show your best effort, your nurse or respiratory therapist will set it to a desired goal.  If possible, sit up straight or lean slightly forward. Try not to slouch.  Hold the incentive spirometer in an upright position. INSTRUCTIONS FOR USE  3. Sit on the edge of your bed if possible, or sit up as far as you can in bed or on a chair. 4. Hold the incentive spirometer in an upright position. 5. Breathe out normally. 6. Place the mouthpiece in your mouth and seal your lips tightly around it. 7. Breathe in slowly and as deeply as possible, raising the piston or the ball toward the top of the column. 8. Hold your breath for 3-5 seconds or for as long as possible. Allow the piston or ball to fall to the bottom of the column. 9. Remove the mouthpiece from your mouth and breathe out normally. 10. Rest for a few seconds and repeat Steps 1 through 7 at least 10 times every 1-2 hours when you are awake. Take your time and take a few normal breaths between deep breaths. 11. The spirometer may include an indicator to show your best effort. Use the indicator as a goal to work  toward during each repetition. 12. After each set of 10 deep breaths, practice coughing to be sure your lungs are clear. If you have an incision (the cut made at the time of surgery), support your incision when coughing by placing a pillow or rolled up towels firmly against it. Once you are able to get out of bed, walk around indoors and cough well. You may stop using the incentive spirometer when instructed by your caregiver.  RISKS AND COMPLICATIONS  Take your time so you do not get dizzy or light-headed.  If you are in pain, you may need to take or ask for  pain medication before doing incentive spirometry. It is harder to take a deep breath if you are having pain. AFTER USE  Rest and breathe slowly and easily.  It can be helpful to keep track of a log of your progress. Your caregiver can provide you with a simple table to help with this. If you are using the spirometer at home, follow these instructions: [place] IF:   You are having difficultly using the spirometer.  You have trouble using the spirometer as often as instructed.  Your pain medication is not giving enough relief while using the spirometer.  You develop fever of 100.5 F (38.1 C) or higher. SEEK IMMEDIATE MEDICAL CARE IF:   You cough up bloody sputum that had not been present before.  You develop fever of 102 F (38.9 C) or greater.  You develop worsening pain at or near the incision site. MAKE SURE YOU:   Understand these instructions.  Will watch your condition.  Will get help right away if you are not doing well or get worse. Document Released: 12/06/2006 Document Revised: 10/18/2011 Document Reviewed: 02/06/2007 [facility] Patient Information 2014 [place], [place].   ________________________________________________________________________

## 2015-09-26 NOTE — H&P (Signed)
TOTAL KNEE ADMISSION H&P  Patient is being admitted for right total knee arthroplasty.  Subjective:  Chief Complaint:   Right knee primary OA / pain  HPI: Misty Morris, 65 y.o. female, has a history of pain and functional disability in the right knee due to arthritis and has failed non-surgical conservative treatments for greater than 12 weeks to include NSAID's and/or analgesics, corticosteriod injections, viscosupplementation injections, use of assistive devices and activity modification.  Onset of symptoms was gradual, starting 1.5+ years ago with gradually worsening course since that time. The patient noted no past surgery on the right knee(s).  Patient currently rates pain in the right knee(s) at 9 out of 10 with activity. Patient has worsening of pain with activity and weight bearing, pain that interferes with activities of daily living, pain with passive range of motion, crepitus and joint swelling.  Patient has evidence of periarticular osteophytes and joint space narrowing by imaging studies.  There is no active infection.  Risks, benefits and expectations were discussed with the patient.  Risks including but not limited to the risk of anesthesia, blood clots, nerve damage, blood vessel damage, failure of the prosthesis, infection and up to and including death.  Patient understand the risks, benefits and expectations and wishes to proceed with surgery.   PCP: Donnie Coffin, MD  D/C Plans:      Home with HHPT (mother's house)  Post-op Meds:       No Rx given   Tranexamic Acid:      To be given - IV   Decadron:      Is to be given  FYI:     ASA   Norco  Dilaudid  (ok per pt)    Past Medical History  Diagnosis Date  . Hypertension   . Shingles   . Polyarthritis rheumatica (HCC)     RA  . GERD (gastroesophageal reflux disease)   . Diabetes mellitus without complication ([place])     diet controlled    Past Surgical History  Procedure Laterality Date  . Abdominal hysterectomy     . Rhinoplasty    . [place] 1  1980's    No prescriptions prior to admission   Allergies  Allergen Reactions  . Latex Other (See Comments)    Blistering   . Codeine Hives  . Demerol [Meperidine] Hives  . Morphine And Related Hives  . Sulfa Antibiotics Hives  . Tramadol Hives  . Vicodin [Hydrocodone-Acetaminophen] Itching    Elevated blood pressure. Tolerates low doses.    Social History  Substance Use Topics  . Smoking status: Former Smoker    Types: Cigarettes    Quit date: 12/27/2011  . Smokeless tobacco: Never Used  . Alcohol Use: No       Review of Systems  Constitutional: Positive for malaise/fatigue.  HENT: Negative.   Eyes: Negative.   Respiratory: Negative.   Cardiovascular: Negative.   Gastrointestinal: Positive for heartburn.  Genitourinary: Negative.   Musculoskeletal: Positive for myalgias and joint pain.  Skin: Negative.   Neurological: Negative.   Endo/Heme/Allergies: Negative.   Psychiatric/Behavioral: Negative.     Objective:  Physical Exam  Constitutional: She is oriented to person, place, and time. She appears well-developed.  HENT:  Head: Normocephalic.  Eyes: Pupils are equal, round, and reactive to light.  Neck: Neck supple. No JVD present. No tracheal deviation present. No thyromegaly present.  Cardiovascular: Normal rate, regular rhythm, normal heart sounds and intact distal pulses.   Respiratory: Effort normal  and breath sounds normal. No stridor. No respiratory distress. She has no wheezes.  GI: Soft. There is no tenderness. There is no guarding.  Musculoskeletal:       Right knee: She exhibits decreased range of motion, swelling, abnormal alignment and bony tenderness. She exhibits no ecchymosis, no deformity, no laceration and no erythema. Tenderness found.  Lymphadenopathy:    She has no cervical adenopathy.  Neurological: She is alert and oriented to person, place, and time.  Skin: Skin is warm and dry.  Psychiatric: She has  a normal mood and affect.      Labs:  Estimated body mass index is 28.39 kg/(m^2) as calculated from the following:   Height as of 08/27/15: 5\' 3"  (1.6 m).   Weight as of 08/27/15: 72.666 kg (160 lb 3.2 oz).   Imaging Review Plain radiographs demonstrate severe degenerative joint disease of the right knee(s). The overall alignment is mild varus. The bone quality appears to be good for age and reported activity level.  Assessment/Plan:  End stage arthritis, right knee   The patient history, physical examination, clinical judgment of the provider and imaging studies are consistent with end stage degenerative joint disease of the right knee(s) and total knee arthroplasty is deemed medically necessary. The treatment options including medical management, injection therapy arthroscopy and arthroplasty were discussed at length. The risks and benefits of total knee arthroplasty were presented and reviewed. The risks due to aseptic loosening, infection, stiffness, patella tracking problems, thromboembolic complications and other imponderables were discussed. The patient acknowledged the explanation, agreed to proceed with the plan and consent was signed. Patient is being admitted for inpatient treatment for surgery, pain control, PT, OT, prophylactic antibiotics, VTE prophylaxis, progressive ambulation and ADL's and discharge planning. The patient is planning to be discharged home with home health services.     West Pugh Elin Seats   PA-C  09/26/2015, 10:45 AM

## 2015-09-30 ENCOUNTER — Encounter (HOSPITAL_COMMUNITY): Payer: Self-pay | Admitting: *Deleted

## 2015-09-30 ENCOUNTER — Inpatient Hospital Stay (HOSPITAL_COMMUNITY): Payer: Medicare Other | Admitting: Anesthesiology

## 2015-09-30 ENCOUNTER — Inpatient Hospital Stay (HOSPITAL_COMMUNITY)
Admission: RE | Admit: 2015-09-30 | Discharge: 2015-10-02 | DRG: 470 | Disposition: A | Discharge status: Home Health | Payer: Medicare Other | Source: Ambulatory Visit | Attending: Orthopedic Surgery | Admitting: Orthopedic Surgery

## 2015-09-30 ENCOUNTER — Encounter (HOSPITAL_COMMUNITY)
Admission: RE | Disposition: A | Discharge status: Home Health | Payer: Self-pay | Source: Ambulatory Visit | Attending: Orthopedic Surgery

## 2015-09-30 DIAGNOSIS — E119 Type 2 diabetes mellitus without complications: Secondary | ICD-10-CM | POA: Diagnosis present

## 2015-09-30 DIAGNOSIS — M25561 Pain in right knee: Secondary | ICD-10-CM | POA: Diagnosis present

## 2015-09-30 DIAGNOSIS — Z87891 Personal history of nicotine dependence: Secondary | ICD-10-CM

## 2015-09-30 DIAGNOSIS — Z6827 Body mass index (BMI) 27.0-27.9, adult: Secondary | ICD-10-CM | POA: Diagnosis not present

## 2015-09-30 DIAGNOSIS — M1711 Unilateral primary osteoarthritis, right knee: Principal | ICD-10-CM | POA: Diagnosis present

## 2015-09-30 DIAGNOSIS — I10 Essential (primary) hypertension: Secondary | ICD-10-CM | POA: Diagnosis present

## 2015-09-30 DIAGNOSIS — K219 Gastro-esophageal reflux disease without esophagitis: Secondary | ICD-10-CM | POA: Diagnosis present

## 2015-09-30 DIAGNOSIS — Z01812 Encounter for preprocedural laboratory examination: Secondary | ICD-10-CM

## 2015-09-30 DIAGNOSIS — E663 Overweight: Secondary | ICD-10-CM | POA: Diagnosis present

## 2015-09-30 DIAGNOSIS — Z96659 Presence of unspecified artificial knee joint: Secondary | ICD-10-CM

## 2015-09-30 DIAGNOSIS — M659 Synovitis and tenosynovitis, unspecified: Secondary | ICD-10-CM | POA: Diagnosis present

## 2015-09-30 DIAGNOSIS — Z96651 Presence of right artificial knee joint: Secondary | ICD-10-CM

## 2015-09-30 HISTORY — PX: TOTAL KNEE ARTHROPLASTY: SHX125

## 2015-09-30 LAB — TYPE AND SCREEN
ABO/RH(D): O POS
Antibody Screen: NEGATIVE

## 2015-09-30 LAB — GLUCOSE, CAPILLARY: Glucose-Capillary: 149 mg/dL — ABNORMAL HIGH (ref 65–99)

## 2015-09-30 SURGERY — ARTHROPLASTY, KNEE, TOTAL
Anesthesia: General | Site: Knee | Laterality: Right

## 2015-09-30 MED ORDER — MENTHOL 3 MG MT LOZG: 1.0000 | LOZENGE | OROMUCOSAL | Status: DC | PRN

## 2015-09-30 MED ORDER — PROPOFOL 10 MG/ML IV BOLUS
INTRAVENOUS | Status: AC
  Filled 2015-09-30: qty 60

## 2015-09-30 MED ORDER — HYDROMORPHONE HCL 1 MG/ML IJ SOLN
INTRAMUSCULAR | Status: AC
  Filled 2015-09-30: qty 1

## 2015-09-30 MED ORDER — HYDROMORPHONE HCL 1 MG/ML IJ SOLN
0.5000 mg | INTRAMUSCULAR | Status: DC | PRN
  Administered 2015-10-01: 1 mg via INTRAVENOUS
  Filled 2015-09-30: qty 2
  Filled 2015-09-30: qty 1

## 2015-09-30 MED ORDER — PREDNISONE 10 MG PO TABS
10.0000 mg | ORAL_TABLET | Freq: Every day | ORAL | Status: DC
  Administered 2015-09-30 – 2015-10-01 (×2): 10 mg via ORAL
  Filled 2015-09-30 (×3): qty 1

## 2015-09-30 MED ORDER — MIDAZOLAM HCL 2 MG/2ML IJ SOLN
INTRAMUSCULAR | Status: AC
  Filled 2015-09-30: qty 2

## 2015-09-30 MED ORDER — KETOROLAC TROMETHAMINE 30 MG/ML IJ SOLN
INTRAMUSCULAR | Status: AC
  Filled 2015-09-30: qty 1

## 2015-09-30 MED ORDER — PHENYLEPHRINE 40 MCG/ML (10ML) SYRINGE FOR IV PUSH (FOR BLOOD PRESSURE SUPPORT)
PREFILLED_SYRINGE | INTRAVENOUS | Status: AC
  Filled 2015-09-30: qty 10

## 2015-09-30 MED ORDER — BUPIVACAINE IN DEXTROSE 0.75-8.25 % IT SOLN
INTRATHECAL | Status: DC | PRN
  Administered 2015-09-30: 1.8 mL via INTRATHECAL

## 2015-09-30 MED ORDER — DEXTROSE 5 % IV SOLN
500.0000 mg | Freq: Four times a day (QID) | INTRAVENOUS | Status: DC | PRN
  Administered 2015-09-30: 500 mg via INTRAVENOUS
  Filled 2015-09-30 (×2): qty 5

## 2015-09-30 MED ORDER — DEXAMETHASONE SODIUM PHOSPHATE 10 MG/ML IJ SOLN: 10.0000 mg | Freq: Once | INTRAMUSCULAR | Status: DC

## 2015-09-30 MED ORDER — AMLODIPINE BESYLATE 5 MG PO TABS
5.0000 mg | ORAL_TABLET | Freq: Every day | ORAL | Status: DC
  Administered 2015-09-30 – 2015-10-02 (×3): 5 mg via ORAL
  Filled 2015-09-30 (×3): qty 1

## 2015-09-30 MED ORDER — PROPOFOL 500 MG/50ML IV EMUL
INTRAVENOUS | Status: DC | PRN
  Administered 2015-09-30: 75 ug/kg/min via INTRAVENOUS

## 2015-09-30 MED ORDER — BUPIVACAINE-EPINEPHRINE (PF) 0.25% -1:200000 IJ SOLN
INTRAMUSCULAR | Status: DC | PRN
  Administered 2015-09-30: 30 mL

## 2015-09-30 MED ORDER — ASPIRIN EC 325 MG PO TBEC
325.0000 mg | DELAYED_RELEASE_TABLET | Freq: Two times a day (BID) | ORAL | Status: DC
  Administered 2015-10-01 – 2015-10-02 (×3): 325 mg via ORAL
  Filled 2015-09-30 (×5): qty 1

## 2015-09-30 MED ORDER — PROPOFOL 10 MG/ML IV BOLUS
INTRAVENOUS | Status: DC | PRN
  Administered 2015-09-30: 30 mg via INTRAVENOUS

## 2015-09-30 MED ORDER — FENTANYL CITRATE (PF) 100 MCG/2ML IJ SOLN
INTRAMUSCULAR | Status: AC
  Filled 2015-09-30: qty 2

## 2015-09-30 MED ORDER — SODIUM CHLORIDE 0.9 % IV SOLN
INTRAVENOUS | Status: DC
  Administered 2015-09-30 – 2015-10-01 (×2): via INTRAVENOUS
  Filled 2015-09-30 (×4): qty 1000

## 2015-09-30 MED ORDER — DIPHENHYDRAMINE HCL 25 MG PO CAPS: 25.0000 mg | ORAL_CAPSULE | Freq: Four times a day (QID) | ORAL | Status: DC | PRN

## 2015-09-30 MED ORDER — KETOROLAC TROMETHAMINE 30 MG/ML IJ SOLN
INTRAMUSCULAR | Status: DC | PRN
  Administered 2015-09-30: 30 mg

## 2015-09-30 MED ORDER — PHENOL 1.4 % MT LIQD
1.0000 | OROMUCOSAL | Status: DC | PRN
  Filled 2015-09-30: qty 177

## 2015-09-30 MED ORDER — SODIUM CHLORIDE 0.9 % IJ SOLN
INTRAMUSCULAR | Status: DC | PRN
  Administered 2015-09-30: 30 mL

## 2015-09-30 MED ORDER — FERROUS SULFATE 325 (65 FE) MG PO TABS
325.0000 mg | ORAL_TABLET | Freq: Three times a day (TID) | ORAL | Status: DC
Start: 2015-09-30 — End: 2015-10-02
  Administered 2015-09-30 – 2015-10-02 (×5): 325 mg via ORAL
  Filled 2015-09-30 (×8): qty 1

## 2015-09-30 MED ORDER — CEFAZOLIN SODIUM-DEXTROSE 2-3 GM-% IV SOLR
INTRAVENOUS | Status: AC
  Filled 2015-09-30: qty 50

## 2015-09-30 MED ORDER — ONDANSETRON HCL 4 MG PO TABS: 4.0000 mg | ORAL_TABLET | Freq: Four times a day (QID) | ORAL | Status: DC | PRN

## 2015-09-30 MED ORDER — STERILE WATER FOR IRRIGATION IR SOLN
Status: DC | PRN
  Administered 2015-09-30: 2000 mL

## 2015-09-30 MED ORDER — SODIUM CHLORIDE 0.9 % IJ SOLN
INTRAMUSCULAR | Status: AC
  Filled 2015-09-30: qty 50

## 2015-09-30 MED ORDER — METHOCARBAMOL 500 MG PO TABS
500.0000 mg | ORAL_TABLET | Freq: Four times a day (QID) | ORAL | Status: DC | PRN
  Filled 2015-09-30 (×2): qty 1

## 2015-09-30 MED ORDER — HYDROCORTISONE NA SUCCINATE PF 1000 MG IJ SOLR
INTRAMUSCULAR | Status: DC | PRN
  Administered 2015-09-30: 100 mg via INTRAVENOUS

## 2015-09-30 MED ORDER — PROMETHAZINE HCL 25 MG/ML IJ SOLN: 6.2500 mg | INTRAMUSCULAR | Status: DC | PRN

## 2015-09-30 MED ORDER — ALUM & MAG HYDROXIDE-SIMETH 200-200-20 MG/5ML PO SUSP: 30.0000 mL | ORAL | Status: DC | PRN

## 2015-09-30 MED ORDER — DEXAMETHASONE SODIUM PHOSPHATE 10 MG/ML IJ SOLN
10.0000 mg | Freq: Once | INTRAMUSCULAR | Status: AC
  Administered 2015-10-01: 10 mg via INTRAVENOUS
  Filled 2015-09-30: qty 1

## 2015-09-30 MED ORDER — TRANEXAMIC ACID 1000 MG/10ML IV SOLN
1000.0000 mg | Freq: Once | INTRAVENOUS | Status: AC
  Administered 2015-09-30: 1000 mg via INTRAVENOUS
  Filled 2015-09-30: qty 10

## 2015-09-30 MED ORDER — BUPIVACAINE-EPINEPHRINE (PF) 0.25% -1:200000 IJ SOLN
INTRAMUSCULAR | Status: AC
  Filled 2015-09-30: qty 30

## 2015-09-30 MED ORDER — LATANOPROST 0.005 % OP SOLN
1.0000 [drp] | Freq: Every day | OPHTHALMIC | Status: DC
  Administered 2015-09-30: 1 [drp] via OPHTHALMIC
  Filled 2015-09-30: qty 2.5

## 2015-09-30 MED ORDER — MIDAZOLAM HCL 5 MG/5ML IJ SOLN
INTRAMUSCULAR | Status: DC | PRN
  Administered 2015-09-30: 2 mg via INTRAVENOUS

## 2015-09-30 MED ORDER — PROPOFOL 10 MG/ML IV BOLUS
INTRAVENOUS | Status: AC
  Filled 2015-09-30: qty 20

## 2015-09-30 MED ORDER — LACTATED RINGERS IV SOLN
INTRAVENOUS | Status: DC
  Administered 2015-09-30: 1000 mL via INTRAVENOUS
  Administered 2015-09-30 (×2): via INTRAVENOUS

## 2015-09-30 MED ORDER — METOCLOPRAMIDE HCL 5 MG/ML IJ SOLN: 5.0000 mg | Freq: Three times a day (TID) | INTRAMUSCULAR | Status: DC | PRN

## 2015-09-30 MED ORDER — 0.9 % SODIUM CHLORIDE (POUR BTL) OPTIME
TOPICAL | Status: DC | PRN
  Administered 2015-09-30: 1000 mL

## 2015-09-30 MED ORDER — MAGNESIUM CITRATE PO SOLN: 1.0000 | Freq: Once | ORAL | Status: DC | PRN

## 2015-09-30 MED ORDER — SODIUM CHLORIDE 0.9 % IR SOLN
Status: DC | PRN
  Administered 2015-09-30: 1000 mL

## 2015-09-30 MED ORDER — BISACODYL 10 MG RE SUPP: 10.0000 mg | Freq: Every day | RECTAL | Status: DC | PRN

## 2015-09-30 MED ORDER — DOCUSATE SODIUM 100 MG PO CAPS
100.0000 mg | ORAL_CAPSULE | Freq: Two times a day (BID) | ORAL | Status: DC
  Administered 2015-09-30 – 2015-10-02 (×4): 100 mg via ORAL

## 2015-09-30 MED ORDER — POLYETHYLENE GLYCOL 3350 17 G PO PACK
17.0000 g | PACK | Freq: Two times a day (BID) | ORAL | Status: DC
  Administered 2015-09-30 – 2015-10-02 (×4): 17 g via ORAL

## 2015-09-30 MED ORDER — HYDROCODONE-ACETAMINOPHEN 7.5-325 MG PO TABS
ORAL_TABLET | ORAL | Status: AC
  Filled 2015-09-30: qty 1

## 2015-09-30 MED ORDER — CHLORHEXIDINE GLUCONATE 4 % EX LIQD: 60.0000 mL | Freq: Once | CUTANEOUS | Status: DC

## 2015-09-30 MED ORDER — HYDROMORPHONE HCL 1 MG/ML IJ SOLN: 0.2500 mg | INTRAMUSCULAR | Status: DC | PRN

## 2015-09-30 MED ORDER — CEFAZOLIN SODIUM-DEXTROSE 2-3 GM-% IV SOLR
2.0000 g | INTRAVENOUS | Status: AC
  Administered 2015-09-30: 2 g via INTRAVENOUS

## 2015-09-30 MED ORDER — ONDANSETRON HCL 4 MG/2ML IJ SOLN: 4.0000 mg | Freq: Four times a day (QID) | INTRAMUSCULAR | Status: DC | PRN

## 2015-09-30 MED ORDER — HYDROCORTISONE NA SUCCINATE PF 100 MG IJ SOLR
INTRAMUSCULAR | Status: AC
  Filled 2015-09-30: qty 2

## 2015-09-30 MED ORDER — PHENYLEPHRINE HCL 10 MG/ML IJ SOLN
INTRAMUSCULAR | Status: DC | PRN
  Administered 2015-09-30: 80 ug via INTRAVENOUS
  Administered 2015-09-30: 40 ug via INTRAVENOUS

## 2015-09-30 MED ORDER — CEFAZOLIN SODIUM-DEXTROSE 2-3 GM-% IV SOLR
2.0000 g | Freq: Four times a day (QID) | INTRAVENOUS | Status: AC
  Administered 2015-09-30 (×2): 2 g via INTRAVENOUS
  Filled 2015-09-30 (×4): qty 50

## 2015-09-30 MED ORDER — TIMOLOL MALEATE 0.5 % OP SOLN
1.0000 [drp] | Freq: Every day | OPHTHALMIC | Status: DC
  Filled 2015-09-30: qty 5

## 2015-09-30 MED ORDER — PREDNISONE 5 MG PO TABS
7.5000 mg | ORAL_TABLET | Freq: Every day | ORAL | Status: DC
  Administered 2015-10-01 – 2015-10-02 (×2): 7.5 mg via ORAL
  Filled 2015-09-30 (×3): qty 1

## 2015-09-30 MED ORDER — HYDROCODONE-ACETAMINOPHEN 7.5-325 MG PO TABS
1.0000 | ORAL_TABLET | ORAL | Status: DC
  Administered 2015-09-30 – 2015-10-01 (×5): 1 via ORAL
  Filled 2015-09-30: qty 2
  Filled 2015-09-30: qty 1
  Filled 2015-09-30: qty 2
  Filled 2015-09-30 (×2): qty 1

## 2015-09-30 MED ORDER — METOCLOPRAMIDE HCL 10 MG PO TABS: 5.0000 mg | ORAL_TABLET | Freq: Three times a day (TID) | ORAL | Status: DC | PRN

## 2015-09-30 MED ORDER — FENTANYL CITRATE (PF) 100 MCG/2ML IJ SOLN
25.0000 ug | INTRAMUSCULAR | Status: DC | PRN
  Administered 2015-09-30 (×2): 25 ug via INTRAVENOUS
  Administered 2015-09-30 (×2): 50 ug via INTRAVENOUS

## 2015-09-30 SURGICAL SUPPLY — 43 items
BANDAGE ACE 6X5 VEL STRL LF (GAUZE/BANDAGES/DRESSINGS) ×3 IMPLANT
BLADE SAW SGTL 13.0X1.19X90.0M (BLADE) ×3 IMPLANT
BONE CEMENT GENTAMICIN (Cement) ×6 IMPLANT
BOWL SMART MIX CTS (DISPOSABLE) ×3 IMPLANT
CAPT KNEE TOTAL 3 ATTUNE ×2 IMPLANT
CEMENT BONE GENTAMICIN 40 (Cement) IMPLANT
CLOTH BEACON ORANGE TIMEOUT ST (SAFETY) ×3 IMPLANT
CUFF TOURN SGL QUICK 34 (TOURNIQUET CUFF) ×3
CUFF TRNQT CYL 34X4X40X1 (TOURNIQUET CUFF) ×1 IMPLANT
DECANTER SPIKE VIAL GLASS SM (MISCELLANEOUS) ×3 IMPLANT
DRAPE U-SHAPE 47X51 STRL (DRAPES) ×3 IMPLANT
DRSG AQUACEL AG ADV 3.5X10 (GAUZE/BANDAGES/DRESSINGS) ×3 IMPLANT
DURAPREP 26ML APPLICATOR (WOUND CARE) ×6 IMPLANT
ELECT REM PT RETURN 9FT ADLT (ELECTROSURGICAL) ×3
ELECTRODE REM PT RTRN 9FT ADLT (ELECTROSURGICAL) ×1 IMPLANT
GLOVE BIOGEL PI IND STRL 6.5 (GLOVE) IMPLANT
GLOVE BIOGEL PI IND STRL 7.5 (GLOVE) ×1 IMPLANT
GLOVE BIOGEL PI IND STRL 8.5 (GLOVE) ×1 IMPLANT
GLOVE BIOGEL PI INDICATOR 6.5 (GLOVE) ×4
GLOVE BIOGEL PI INDICATOR 7.5 (GLOVE) ×4
GLOVE BIOGEL PI INDICATOR 8.5 (GLOVE) ×2
GLOVE SURG SS PI 6.5 STRL IVOR (GLOVE) ×2 IMPLANT
GLOVE SURG SS PI 7.5 STRL IVOR (GLOVE) ×6 IMPLANT
GLOVE SURG SS PI 8.0 STRL IVOR (GLOVE) ×2 IMPLANT
GOWN STRL REUS W/TWL LRG LVL3 (GOWN DISPOSABLE) ×3 IMPLANT
GOWN STRL REUS W/TWL XL LVL3 (GOWN DISPOSABLE) ×7 IMPLANT
HANDPIECE INTERPULSE COAX TIP (DISPOSABLE) ×3
LIQUID BAND (GAUZE/BANDAGES/DRESSINGS) ×3 IMPLANT
MANIFOLD NEPTUNE II (INSTRUMENTS) ×3 IMPLANT
PACK TOTAL KNEE CUSTOM (KITS) ×3 IMPLANT
POSITIONER SURGICAL ARM (MISCELLANEOUS) ×3 IMPLANT
SET HNDPC FAN SPRY TIP SCT (DISPOSABLE) ×1 IMPLANT
SET PAD KNEE POSITIONER (MISCELLANEOUS) ×3 IMPLANT
SUCTION FRAZIER 12FR DISP (SUCTIONS) ×3 IMPLANT
SUT MNCRL AB 4-0 PS2 18 (SUTURE) ×3 IMPLANT
SUT VIC AB 1 CT1 36 (SUTURE) ×3 IMPLANT
SUT VIC AB 2-0 CT1 27 (SUTURE) ×9
SUT VIC AB 2-0 CT1 TAPERPNT 27 (SUTURE) ×3 IMPLANT
SUT VLOC 180 0 24IN GS25 (SUTURE) ×3 IMPLANT
SYR 50ML LL SCALE MARK (SYRINGE) ×3 IMPLANT
TRAY FOLEY CATH SILVER 16FR LF (SET/KITS/TRAYS/PACK) ×2 IMPLANT
WRAP KNEE MAXI GEL POST OP (GAUZE/BANDAGES/DRESSINGS) ×3 IMPLANT
YANKAUER SUCT BULB TIP 10FT TU (MISCELLANEOUS) ×3 IMPLANT

## 2015-09-30 NOTE — Op Note (Signed)
NAME:  Misty Morris RECORD NO.:  [id number]                             FACILITY:  [facility]      PHYSICIAN:  Pietro Cassis. Alvan Dame, M.D.  DATE OF BIRTH:  November 27, 1950      DATE OF PROCEDURE:  09/30/2015                                     OPERATIVE REPORT         PREOPERATIVE DIAGNOSIS:  Right knee osteoarthritis.      POSTOPERATIVE DIAGNOSIS:  Right knee osteoarthritis.      FINDINGS:  The patient was noted to have complete loss of cartilage and   bone-on-bone arthritis with associated osteophytes in the medial and patellofemoral compartments of   the knee with a significant synovitis and associated effusion.      PROCEDURE:  Right total knee replacement.      COMPONENTS USED:  DePuy Attune rotating platform posterior stabilized knee   system, a size 3 femur, 4 tibia, size 7 mm PS AOX insert, and 32 anatomic patellar   button.      SURGEON:  Pietro Cassis. Alvan Dame, M.D.      ASSISTANT:  Danae Orleans, PA-C.      ANESTHESIA:  Spinal.      SPECIMENS:  None.      COMPLICATION:  None.      DRAINS:  None  EBL: <50cc      TOURNIQUET TIME:   Total Tourniquet Time Documented: Thigh (Right) - 25 minutes Total: Thigh (Right) - 25 minutes  .      The patient was stable to the recovery room.      INDICATION FOR PROCEDURE:  Misty Morris is a 65 y.o. female patient of   mine.  The patient had been seen, evaluated, and treated conservatively in the   office with medication, activity modification, and injections.  The patient had   radiographic changes of bone-on-bone arthritis with endplate sclerosis and osteophytes noted.      The patient failed conservative measures including medication, injections, and activity modification, and at this point was ready for more definitive measures.   Based on the radiographic changes and failed conservative measures, the patient   decided to proceed with total knee replacement.  Risks of infection,   DVT, component  failure, need for revision surgery, postop course, and   expectations were all   discussed and reviewed.  Consent was obtained for benefit of pain   relief.      PROCEDURE IN DETAIL:  The patient was brought to the operative theater.   Once adequate anesthesia, preoperative antibiotics, 2 gm of Ancef, 1 gm of Tranexamic Acid and 10 mg of Decadron administered, the patient was positioned supine with the right thigh tourniquet placed.  The  right lower extremity was prepped and draped in sterile fashion.  A time-   out was performed identifying the patient, planned procedure, and   extremity.      The right lower extremity was placed in the [facility] leg holder.  The leg was   exsanguinated, tourniquet elevated to 250 mmHg.  A midline incision was   made  followed by median parapatellar arthrotomy.  Following initial   exposure, attention was first directed to the patella.  Precut   measurement was noted to be 23-24 mm.  I resected down to 14 mm and used a   32 patellar button to restore patellar height as well as cover the cut   surface.      The lug holes were drilled and a metal shim was placed to protect the   patella from retractors and saw blades.      At this point, attention was now directed to the femur.  The femoral   canal was opened with a drill, irrigated to try to prevent fat emboli.  An   intramedullary rod was passed at 3 degrees valgus, 8 mm of bone was   resected off the distal femur due to slight pre-operative hyperextension.  Following this resection, the tibia was   subluxated anteriorly.  Using the extramedullary guide, 2 mm of bone was resected off   the proximal medial tibia.  We confirmed the gap would be   stable medially and laterally with a size 5 mm insert as well as confirmed   the cut was perpendicular in the coronal plane, checking with an alignment rod.      Once this was done, I sized the femur to be a size 3 in the anterior-   posterior dimension, chose a  standard component based on medial and   lateral dimension.  The size 3 rotation block was then pinned in   position anterior referenced using the C-clamp to set rotation.  The   anterior, posterior, and  chamfer cuts were made without difficulty nor   notching making certain that I was along the anterior cortex to help   with flexion gap stability.      The final box cut was made off the lateral aspect of distal femur.      At this point, the tibia was sized to be a size 4, the size 4 tray was   then pinned in position through the medial third of the tubercle,   drilled, and keel punched.  Trial reduction was now carried with a 3 femur,  4 tibia, a size 6 then size 7 mm insert, and the 32 anatomic patella botton.  The knee was brought to   extension, full extension with good flexion stability with the patella   tracking through the trochlea without application of pressure.  Given   all these findings the femoral lug holes were drilled and then the trial components removed.  Final components were   opened and cement was mixed.  The knee was irrigated with normal saline   solution and pulse lavage.  The synovial lining was   then injected with 30 cc of 0.25% Marcaine with epinephrine and 1 cc of Toradol plus 30 cc of NS for a   total of 61 cc.      The knee was irrigated.  Final implants were then cemented onto clean and   dried cut surfaces of bone with the knee brought to extension with a size 7 mm trial insert.      Once the cement had fully cured, the excess cement was removed   throughout the knee.  I confirmed I was satisfied with the range of   motion and stability, and the final size 7 mm PS AOX insert was chosen.  It was   placed into the knee.      The  tourniquet had been let down at 25 minutes.  No significant   hemostasis required.  The   extensor mechanism was then reapproximated using #1 Vicryl and #0 V-lock sutures with the knee   in flexion.  The   remaining wound was  closed with 2-0 Vicryl and running 4-0 Monocryl.   The knee was cleaned, dried, dressed sterilely using Dermabond and   Aquacel dressing.  The patient was then   brought to recovery room in stable condition, tolerating the procedure   well.   Please note that Physician Assistant, Danae Orleans, PA-C, was present for the entirety of the case, and was utilized for pre-operative positioning, peri-operative retractor management, general facilitation of the procedure.  He was also utilized for primary wound closure at the end of the case.              Pietro Cassis Alvan Dame, M.D.    09/30/2015 11:20 AM

## 2015-09-30 NOTE — Transfer of Care (Signed)
Immediate Anesthesia Transfer of Care Note  Patient: Misty Morris  Procedure(s) Performed: Procedure(s): TOTAL RIGHT KNEE ARTHROPLASTY (Right)  Patient Location: PACU  Anesthesia Type:Spinal  Level of Consciousness:  sedated, patient cooperative and responds to stimulation  Airway & Oxygen Therapy:Patient Spontanous Breathing and Patient connected to face mask oxgen  Post-op Assessment:  Report given to PACU RN and Post -op Vital signs reviewed and stable  Post vital signs:  Reviewed and stable  Last Vitals:  Filed Vitals:   09/30/15 0655  BP: 181/94  Pulse: 94  Temp: 37.1 C  Resp: 18    Complications: No apparent anesthesia complications

## 2015-09-30 NOTE — Anesthesia Procedure Notes (Signed)
Spinal Patient location during procedure: OR Start time: 09/30/2015 9:59 AM End time: 09/30/2015 10:05 AM Reason for block: at surgeon's request Staffing Resident/CRNA: Anne Fu Performed by: resident/CRNA  Preanesthetic Checklist Completed: patient identified, site marked, surgical consent, pre-op evaluation, timeout performed, IV checked, risks and benefits discussed, monitors and equipment checked and at surgeon's request Spinal Block Patient position: sitting Prep: Betadine Approach: right paramedian Location: L3-4 Injection technique: single-shot Needle Needle type: Whitacre  Needle gauge: 25 G Needle length: 9 cm Assessment Sensory level: T6 Additional Notes Expiration date of kit checked and confirmed. Patient tolerated procedure well, without complications. X 1 attempt with noted clear CSF return. Loss of motor and sensory on exam post injection.

## 2015-09-30 NOTE — Anesthesia Postprocedure Evaluation (Signed)
Anesthesia Post Note  Patient: Misty Morris  Procedure(s) Performed: Procedure(s) (LRB): TOTAL RIGHT KNEE ARTHROPLASTY (Right)  Patient location during evaluation: PACU Anesthesia Type: Spinal Level of consciousness: awake Pain management: pain level controlled Respiratory status: spontaneous breathing Cardiovascular status: stable Postop Assessment: spinal receding Anesthetic complications: no    Last Vitals:  Filed Vitals:   09/30/15 1245 09/30/15 1300  BP: 176/92 173/92  Pulse: 66 76  Temp:    Resp: 15 19    Last Pain:  Filed Vitals:   09/30/15 1317  PainSc: 5                  EDWARDS,Abijah Roussel

## 2015-09-30 NOTE — Interval H&P Note (Signed)
History and Physical Interval Note:  09/30/2015 8:55 AM  Misty Morris  has presented today for surgery, with the diagnosis of RIGHT KNEE OSTEOARTHRTIS  The various methods of treatment have been discussed with the patient and family. After consideration of risks, benefits and other options for treatment, the patient has consented to  Procedure(s): TOTAL RIGHT KNEE ARTHROPLASTY (Right) as a surgical intervention .  The patient's history has been reviewed, patient examined, no change in status, stable for surgery.  I have reviewed the patient's chart and labs.  Questions were answered to the patient's satisfaction.     Mauri Pole

## 2015-09-30 NOTE — Discharge Instructions (Signed)

## 2015-09-30 NOTE — Progress Notes (Signed)
Pt had "shingles shot" Feb 2017.

## 2015-09-30 NOTE — Anesthesia Preprocedure Evaluation (Signed)
Anesthesia Evaluation  Patient identified by MRN, date of birth, ID band Patient awake    Reviewed: Allergy & Precautions, NPO status , Patient's Chart, lab work & pertinent test results  Airway Mallampati: II  TM Distance: >3 FB Neck ROM: Full    Dental   Pulmonary former smoker,    breath sounds clear to auscultation       Cardiovascular hypertension,  Rhythm:Regular Rate:Normal     Neuro/Psych    GI/Hepatic Neg liver ROS, GERD  ,  Endo/Other  diabetes  Renal/GU negative Renal ROS     Musculoskeletal   Abdominal   Peds  Hematology   Anesthesia Other Findings   Reproductive/Obstetrics                             Anesthesia Physical Anesthesia Plan  ASA: III  Anesthesia Plan: General and Spinal   Post-op Pain Management:    Induction: Intravenous  Airway Management Planned: Simple Face Mask  Additional Equipment:   Intra-op Plan:   Post-operative Plan:   Informed Consent: I have reviewed the patients History and Physical, chart, labs and discussed the procedure including the risks, benefits and alternatives for the proposed anesthesia with the patient or authorized representative who has indicated his/her understanding and acceptance.   Dental advisory given  Plan Discussed with: CRNA and Anesthesiologist  Anesthesia Plan Comments:         Anesthesia Quick Evaluation

## 2015-09-30 NOTE — Evaluation (Signed)
Physical Therapy Evaluation Patient Details Name: Misty Morris MRN: [id number] DOB: 04-Apr-1951 Today's Date: 09/30/2015   History of Present Illness  R TKR   Clinical Impression  Pt s/p R TKR presents with decreased R LE strength/ROM and post op pain limiting functional mobility.  Pt should progress to dc home with family/friends and HHPT follow up.    Follow Up Recommendations Home health PT    Equipment Recommendations  Rolling walker with 5" wheels    Recommendations for Other Services OT consult     Precautions / Restrictions Precautions Precautions: Knee;Fall Restrictions Weight Bearing Restrictions: No Other Position/Activity Restrictions: WBAT      Mobility  Bed Mobility Overal bed mobility: Needs Assistance Bed Mobility: Supine to Sit     Supine to sit: Min assist     General bed mobility comments: cues for sequence and use of L LE to self assist  Transfers Overall transfer level: Needs assistance Equipment used: Rolling walker (2 wheeled) Transfers: Sit to/from Stand Sit to Stand: Min assist         General transfer comment: cues for LE management and use of UEs to self assist  Ambulation/Gait Ambulation/Gait assistance: Min assist Ambulation Distance (Feet): 30 Feet Assistive device: Rolling walker (2 wheeled) Gait Pattern/deviations: Step-to pattern;Decreased step length - right;Decreased step length - left;Shuffle;Trunk flexed Gait velocity: decr Gait velocity interpretation: Below normal speed for age/gender General Gait Details: cues for sequence, posture and position from [employer]            Wheelchair Mobility    Modified Rankin (Stroke Patients Only)       Balance                                             Pertinent Vitals/Pain Pain Assessment: 0-10 Pain Score: 6  Pain Location: R knee Pain Descriptors / Indicators: Aching;Sore Pain Intervention(s): Limited activity within patient's  tolerance;Monitored during session;Premedicated before session;Ice applied    Home Living Family/patient expects to be discharged to:: Private residence Living Arrangements: Alone Available Help at Discharge: Family;Friend(s) Type of Home: House Home Access: Level entry     Home Layout: One level Home Equipment: Bedside commode Additional Comments: Pt will be staying at mother's house with assistance of friends    Prior Function Level of Independence: Independent               Hand Dominance        Extremity/Trunk Assessment   Upper Extremity Assessment: Overall WFL for tasks assessed           Lower Extremity Assessment: RLE deficits/detail      Cervical / Trunk Assessment: Normal  Communication   Communication: No difficulties  Cognition Arousal/Alertness: Awake/alert Behavior During Therapy: WFL for tasks assessed/performed Overall Cognitive Status: Within Functional Limits for tasks assessed                      General Comments      Exercises        Assessment/Plan    PT Assessment Patient needs continued PT services  PT Diagnosis Difficulty walking   PT Problem List Decreased strength;Decreased range of motion;Decreased activity tolerance;Decreased mobility;Decreased knowledge of use of DME;Pain  PT Treatment Interventions DME instruction;Gait training;Functional mobility training;Therapeutic activities;Therapeutic exercise;Patient/family education   PT Goals (Current goals can be found in the Care  Plan section) Acute Rehab PT Goals Patient Stated Goal: Resume previous lifestyle with decreased pain PT Goal Formulation: With patient Time For Goal Achievement: 10/04/15 Potential to Achieve Goals: Good    Frequency 7X/week   Barriers to discharge        Co-evaluation               End of Session Equipment Utilized During Treatment: Gait belt Activity Tolerance: Patient tolerated treatment well Patient left: in chair;with  call bell/phone within reach;with chair alarm set Nurse Communication: Mobility status         Time: 1706-1730 PT Time Calculation (min) (ACUTE ONLY): 24 min   Charges:   PT Evaluation $PT Eval Low Complexity: 1 Procedure PT Treatments $Gait Training: 8-22 mins   PT G Codes:        Salinda Snedeker 10-22-15, 6:12 PM

## 2015-10-01 ENCOUNTER — Encounter (HOSPITAL_COMMUNITY): Payer: Self-pay | Admitting: Orthopedic Surgery

## 2015-10-01 DIAGNOSIS — E663 Overweight: Secondary | ICD-10-CM | POA: Diagnosis present

## 2015-10-01 LAB — BASIC METABOLIC PANEL
Anion gap: 8 (ref 5–15)
BUN: 12 mg/dL (ref 6–20)
CHLORIDE: 102 mmol/L (ref 101–111)
CO2: 27 mmol/L (ref 22–32)
CREATININE: 0.58 mg/dL (ref 0.44–1.00)
Calcium: 8.3 mg/dL — ABNORMAL LOW (ref 8.9–10.3)
GFR calc non Af Amer: >60 mL/min (ref >60)
GLUCOSE: 193 mg/dL — AB (ref 65–99)
Potassium: 3.5 mmol/L (ref 3.5–5.1)
Sodium: 137 mmol/L (ref 135–145)

## 2015-10-01 LAB — CBC
HEMATOCRIT: 36 % (ref 36.0–46.0)
HEMOGLOBIN: 11.3 g/dL — AB (ref 12.0–15.0)
MCH: 28.4 pg (ref 26.0–34.0)
MCHC: 31.4 g/dL (ref 30.0–36.0)
MCV: 90.5 fL (ref 78.0–100.0)
Platelets: 240 10*3/uL (ref 150–400)
RBC: 3.98 MIL/uL (ref 3.87–5.11)
RDW: 15.2 % (ref 11.5–15.5)
WBC: 12.8 10*3/uL — ABNORMAL HIGH (ref 4.0–10.5)

## 2015-10-01 LAB — GLUCOSE, CAPILLARY: Glucose-Capillary: 217 mg/dL — ABNORMAL HIGH (ref 65–99)

## 2015-10-01 MED ORDER — KETOROLAC TROMETHAMINE 15 MG/ML IJ SOLN
15.0000 mg | Freq: Four times a day (QID) | INTRAMUSCULAR | Status: DC
  Administered 2015-10-01 – 2015-10-02 (×3): 15 mg via INTRAVENOUS
  Filled 2015-10-01 (×7): qty 1

## 2015-10-01 MED ORDER — KETOROLAC TROMETHAMINE 15 MG/ML IJ SOLN: 15.0000 mg | Freq: Four times a day (QID) | INTRAMUSCULAR | Status: DC

## 2015-10-01 MED ORDER — HYDROMORPHONE HCL 2 MG PO TABS
2.0000 mg | ORAL_TABLET | ORAL | Status: DC
  Administered 2015-10-01 – 2015-10-02 (×7): 2 mg via ORAL
  Filled 2015-10-01 (×2): qty 2
  Filled 2015-10-01 (×4): qty 1
  Filled 2015-10-01 (×2): qty 2

## 2015-10-01 MED ORDER — FERROUS SULFATE 325 (65 FE) MG PO TABS: 325.0000 mg | ORAL_TABLET | Freq: Three times a day (TID) | ORAL | Status: DC

## 2015-10-01 MED ORDER — DOCUSATE SODIUM 100 MG PO CAPS: 100.0000 mg | ORAL_CAPSULE | Freq: Two times a day (BID) | ORAL | Status: DC

## 2015-10-01 MED ORDER — ASPIRIN 325 MG PO TBEC: 325.0000 mg | DELAYED_RELEASE_TABLET | Freq: Two times a day (BID) | ORAL | Status: DC

## 2015-10-01 MED ORDER — POLYETHYLENE GLYCOL 3350 17 G PO PACK: 17.0000 g | PACK | Freq: Two times a day (BID) | ORAL | Status: DC

## 2015-10-01 MED ORDER — METHOCARBAMOL 500 MG PO TABS: 500.0000 mg | ORAL_TABLET | Freq: Four times a day (QID) | ORAL | Status: DC | PRN

## 2015-10-01 MED ORDER — HYDROCODONE-ACETAMINOPHEN 7.5-325 MG PO TABS: 1.0000 | ORAL_TABLET | ORAL | Status: DC | PRN

## 2015-10-01 MED ORDER — HYDROMORPHONE HCL 2 MG PO TABS: 2.0000 mg | ORAL_TABLET | ORAL | Status: DC | PRN

## 2015-10-01 NOTE — Progress Notes (Signed)
     Subjective: 1 Day Post-Op Procedure(s) (LRB): TOTAL RIGHT KNEE ARTHROPLASTY (Right)   Seen with Dr. Alvan Dame. Patient reports pain as moderate, pain controlled with medication.  States that she only took one pill this AM, but after they started to move her leg she has requested the second pain pill.  No events throughout the night.  Ready to be discharged home if she does well with PT and pain stays controlled.   Objective:   VITALS:   Filed Vitals:   10/01/15 0236 10/01/15 0626  BP: 149/91 192/89  Pulse: 74 84  Temp: 97.9 F (36.6 C) 98.2 F (36.8 C)  Resp: 16 16    Dorsiflexion/Plantar flexion intact Incision: dressing C/D/I No cellulitis present Compartment soft  LABS  Recent Labs  10/01/15 0428  HGB 11.3*  HCT 36.0  WBC 12.8*  PLT 240     Recent Labs  10/01/15 0428  NA 137  K 3.5  BUN 12  CREATININE 0.58  GLUCOSE 193*     Assessment/Plan: 1 Day Post-Op Procedure(s) (LRB): TOTAL RIGHT KNEE ARTHROPLASTY (Right) Foley cath d/c'ed Advance diet Up with therapy D/C IV fluids Discharge home with home health  Follow up in 2 weeks at [facility]. Follow up with OLIN,Tiarah Shisler D in 2 weeks.  Contact information:  [facility] [address], [place] [id number]    Overweight (BMI 25-29.9) Estimated body mass index is 27.11 kg/(m^2) as calculated from the following:   Height as of this encounter: 5\' 3"  (1.6 m).   Weight as of this encounter: 69.4 kg (153 lb). Patient also counseled that weight may inhibit the healing process Patient counseled that losing weight will help with future health issues        West Pugh. Bhavin Monjaraz   PAC  10/01/2015, 8:09 AM

## 2015-10-01 NOTE — Progress Notes (Signed)
Pt foley d/c per orders. Pt tolerated well. Pt sitting in chair. Due to void by 2:40pm. Pt aware, call light in reach, will continue to monitor.

## 2015-10-01 NOTE — Progress Notes (Signed)
Utilization review completed.  

## 2015-10-01 NOTE — Progress Notes (Signed)
[facility]    [facility] is providing the following services: RW (patient declined commode)  If patient discharges after hours, please call [phone].   Linward Headland 10/01/2015, 12:00 PM

## 2015-10-01 NOTE — Progress Notes (Signed)
PT Cancellation Note  Patient Details Name: Misty Morris MRN: [id number] DOB: 02-Feb-1951   Cancelled Treatment:     PT attempted x 3 but deferred 2* pt c/o increasing pain.  Will follow in pm.   [name] 10/01/2015, 12:24 PM

## 2015-10-01 NOTE — Evaluation (Addendum)
Occupational Therapy Evaluation AND Discharge  Patient Details Name: Misty Morris MRN: [id number] DOB: [birth year] Today's Date: 10/01/2015    History of Present Illness R TKR    Clinical Impression   Patient admitted with above. Patient independent PTA. Patient currently functioning at an overall supervision level.  No additional OT needs identified, D/C from acute OT services and no additional follow-up OT needs at this time. All appropriate education provided to patient. Please re-order OT if needed.      Follow Up Recommendations  No OT follow up;Supervision - Intermittent    Equipment Recommendations  None recommended by OT    Recommendations for Other Services  None at this time   Precautions / Restrictions Precautions Precautions: Knee;Fall Precaution Comments: reviewed knee precautions, including no pillow under knee Restrictions Weight Bearing Restrictions: Yes RLE Weight Bearing: Weight bearing as tolerated    Mobility Bed Mobility General bed mobility comments: Pt found seated in recliner upon OT entering/exiting room   Transfers Overall transfer level: Needs assistance Equipment used: Rolling walker (2 wheeled) Transfers: Sit to/from Stand Sit to Stand: Supervision General transfer comment: Supervision for safety. Cues needed for RLE management during transfers, but no cues needed for hand placement.     Balance Overall balance assessment: Needs assistance Sitting-balance support: No upper extremity supported;Feet supported Sitting balance-Leahy Scale: Good     Standing balance support: Bilateral upper extremity supported;During functional activity Standing balance-Leahy Scale: Good    ADL Overall ADL's : Needs assistance/impaired General ADL Comments: Pt overall supervision with ADLs and functional mobility. Pt states she has a tub transfer bench to use in her tub/shower unit. Pt also states she has a 3-n-1, educated pt on using 3-n-1 over toilet seat.  Pt able to reach BLEs for LB ADLs. Pt's sats greater than 90% entire session while patient on RA. Pt reports she has a reacher and LH sponge for use at home as well.     Pertinent Vitals/Pain Pain Assessment: 0-10 Pain Score: 8  Pain Location: right knee Pain Descriptors / Indicators: Aching;Sore ("just pain") Pain Intervention(s): Monitored during session;Repositioned;Ice applied;Patient requesting pain meds-RN notified;RN gave pain meds during session     Hand Dominance Right   Extremity/Trunk Assessment Upper Extremity Assessment Upper Extremity Assessment: Overall WFL for tasks assessed   Lower Extremity Assessment Lower Extremity Assessment: Defer to PT evaluation   Cervical / Trunk Assessment Cervical / Trunk Assessment: Normal   Communication Communication Communication: No difficulties   Cognition Arousal/Alertness: Awake/alert Behavior During Therapy: WFL for tasks assessed/performed Overall Cognitive Status: Within Functional Limits for tasks assessed              Home Living Family/patient expects to be discharged to:: Private residence Living Arrangements: Alone Available Help at Discharge: Family;Friend(s) Type of Home: House Home Access: Level entry     Home Layout: One level     Bathroom Shower/Tub: Tub/shower unit;Curtain   Biochemist, clinical: Standard     Home Equipment: Bedside commode;Tub bench   Additional Comments: Pt will be staying at mother's house with assistance of friends      Prior Functioning/Environment Level of Independence: Independent      OT Diagnosis: Generalized weakness;Acute pain   OT Problem List:   N/a, no acute OT needs identified at this time     OT Treatment/Interventions:   N/a, no acute OT needs identified at this time     OT Goals(Current goals can be found in the care plan section) Acute Rehab OT Goals  Patient Stated Goal: go home today OT Goal Formulation: All assessment and education complete, DC therapy   OT Frequency:   N/a, no acute OT needs identified at this time     Barriers to D/C:  none known at this time     End of Session Equipment Utilized During Treatment: Rolling walker Nurse Communication: Patient requests pain meds  Activity Tolerance: Patient tolerated treatment well Patient left: in chair;with call bell/phone within reach   Time: 0832-0852 OT Time Calculation (min): 20 min Charges:  OT General Charges $OT Visit: 1 Procedure OT Evaluation $OT Eval Low Complexity: 1 Procedure  [name] , MS, OTR/L, CLT Pager: [phone]  10/01/2015, 9:04 AM

## 2015-10-01 NOTE — Progress Notes (Signed)
Physical Therapy Treatment Patient Details Name: Misty Morris MRN: [id number] DOB: 1950/12/06 Today's Date: 10/01/2015    History of Present Illness R TKR     PT Comments    Pt progressing although increasing pain after PT session; ice to knee and pt reported feeling better, RN aware; HR up to 128 after amb, 110s after seated rest  Follow Up Recommendations  Home health PT     Equipment Recommendations  Rolling walker with 5" wheels    Recommendations for Other Services       Precautions / Restrictions Precautions Precautions: Knee;Fall Precaution Comments: reviewed knee precautions, including no pillow under knee Restrictions Weight Bearing Restrictions: No Other Position/Activity Restrictions: WBAT    Mobility  Bed Mobility               General bed mobility comments: pt in chair  Transfers Overall transfer level: Needs assistance Equipment used: Rolling walker (2 wheeled) Transfers: Sit to/from Stand Sit to Stand: Supervision         General transfer comment: Supervision for safety. Cues needed for RLE management during transfers, but no cues needed for hand placement.   Ambulation/Gait Ambulation/Gait assistance: Supervision;Min guard Ambulation Distance (Feet): 55 Feet (12' more) Assistive device: Rolling walker (2 wheeled) Gait Pattern/deviations: Step-to pattern;Antalgic Gait velocity: decr   General Gait Details: cues for sequence, posture and position from [employer]            Wheelchair Mobility    Modified Rankin (Stroke Patients Only)       Balance           Standing balance support: No upper extremity supported Standing balance-Leahy Scale: Fair                      Cognition Arousal/Alertness: Awake/alert Behavior During Therapy: WFL for tasks assessed/performed Overall Cognitive Status: Within Functional Limits for tasks assessed                      Exercises Total Joint Exercises Ankle  Circles/Pumps: AROM;Both;10 reps Quad Sets: Strengthening;10 reps;Right Heel Slides: AAROM;Right;10 reps Hip ABduction/ADduction: AROM;Strengthening;Right;10 reps Straight Leg Raises: Strengthening;Right;AAROM    General Comments        Pertinent Vitals/Pain Pain Assessment: 0-10 Pain Score: 6  Pain Location: righ tknee Pain Descriptors / Indicators: Sore Pain Intervention(s): Limited activity within patient's tolerance;Monitored during session;Premedicated before session;Repositioned;Ice applied    Home Living                      Prior Function            PT Goals (current goals can now be found in the care plan section) Acute Rehab PT Goals Patient Stated Goal: go home today PT Goal Formulation: With patient Time For Goal Achievement: 10/04/15 Potential to Achieve Goals: Good Progress towards PT goals: Progressing toward goals    Frequency  7X/week    PT Plan Current plan remains appropriate    Co-evaluation             End of Session Equipment Utilized During Treatment: Gait belt Activity Tolerance: Patient tolerated treatment well Patient left: in chair;with call bell/phone within reach;with chair alarm set     Time: [id number] PT Time Calculation (min) (ACUTE ONLY): 31 min  Charges:  $Gait Training: 8-22 mins $Therapeutic Exercise: 8-22 mins  G CodesKenyon Ana 10/01/2015, 2:51 PM

## 2015-10-01 NOTE — Care Management Note (Signed)
Case Management Note  Patient Details  Name: Misty Morris MRN: [id number] Date of Birth: Dec 05, 1950  Subjective/Objective:                  TOTAL RIGHT KNEE ARTHROPLASTY (Right)  Action/Plan: Discharge planning Expected Discharge Date:                  Expected Discharge Plan:  [place]  In-House Referral:     Discharge planning Services  CM Consult  Post Acute Care Choice:  Home Health Choice offered to:     DME Arranged:  Walker rolling DME Agency:  [place] Arranged:  PT [facility] Agency:  [place]  Status of Service:  Completed, signed off  Medicare Important Message Given:    Date Medicare IM Given:    Medicare IM give by:    Date Additional Medicare IM Given:    Additional Medicare Important Message give by:     If discussed at [place] of Stay Meetings, dates discussed:    Additional Comments: CM met with pt in room to offer choice of home health agency.  Pt chooses [facility] to render HHPT.  Referral called to [employer], Tim.  CM called [facility] DME rep, Lecretia to please deliver the rolling walker to room prior to discharge.  No other CM needs were communicated. Dellie Catholic, RN 10/01/2015, 12:09 PM

## 2015-10-02 LAB — CBC
HCT: 33.2 % — ABNORMAL LOW (ref 36.0–46.0)
Hemoglobin: 10.6 g/dL — ABNORMAL LOW (ref 12.0–15.0)
MCH: 28.3 pg (ref 26.0–34.0)
MCHC: 31.9 g/dL (ref 30.0–36.0)
MCV: 88.8 fL (ref 78.0–100.0)
PLATELETS: 246 10*3/uL (ref 150–400)
RBC: 3.74 MIL/uL — AB (ref 3.87–5.11)
RDW: 15.1 % (ref 11.5–15.5)
WBC: 12.6 10*3/uL — AB (ref 4.0–10.5)

## 2015-10-02 LAB — BASIC METABOLIC PANEL
ANION GAP: 10 (ref 5–15)
BUN: 15 mg/dL (ref 6–20)
CALCIUM: 8.8 mg/dL — AB (ref 8.9–10.3)
CO2: 25 mmol/L (ref 22–32)
Chloride: 101 mmol/L (ref 101–111)
Creatinine, Ser: 0.77 mg/dL (ref 0.44–1.00)
GLUCOSE: 283 mg/dL — AB (ref 65–99)
POTASSIUM: 3.7 mmol/L (ref 3.5–5.1)
Sodium: 136 mmol/L (ref 135–145)

## 2015-10-02 NOTE — Progress Notes (Signed)
RN reviewed discharge instructions with patient and friend. All questions answered.   Paperwork and prescriptions given.   NT rolled patient down in wheelchair to family car.

## 2015-10-02 NOTE — Progress Notes (Signed)
Physical Therapy Treatment Patient Details Name: Misty Morris MRN: [id number] DOB: 1951/04/28 Today's Date: October 17, 2015    History of Present Illness R TKR     PT Comments    Pt  Doing very well today, pain much better; pt ready for D/C from our standpoint  Follow Up Recommendations  Home health PT     Equipment Recommendations  Rolling walker with 5" wheels    Recommendations for Other Services       Precautions / Restrictions Precautions Precautions: Knee Precaution Comments: reviewed no pillow under knee Restrictions Weight Bearing Restrictions: No Other Position/Activity Restrictions: WBAT    Mobility  Bed Mobility               General bed mobility comments: pt in chair  Transfers Overall transfer level: Needs assistance Equipment used: Rolling walker (2 wheeled) Transfers: Sit to/from Stand Sit to Stand: Supervision;Modified independent (Device/Increase time)         General transfer comment: pt self corrrects for hand placement  Ambulation/Gait Ambulation/Gait assistance: Supervision Ambulation Distance (Feet): 90 Feet Assistive device: Rolling walker (2 wheeled) Gait Pattern/deviations: Step-to pattern Gait velocity: decr   General Gait Details: cues for sequence, posture and position from RW; pt self corrects end of walk   Stairs            Wheelchair Mobility    Modified Rankin (Stroke Patients Only)       Balance                                    Cognition Arousal/Alertness: Awake/alert Behavior During Therapy: WFL for tasks assessed/performed Overall Cognitive Status: Within Functional Limits for tasks assessed                      Exercises Total Joint Exercises Ankle Circles/Pumps: AROM;Both;10 reps Quad Sets: Strengthening;10 reps;Right Heel Slides: AAROM;Right;10 reps Hip ABduction/ADduction: AROM;Strengthening;Right;10 reps Straight Leg Raises: Strengthening;Right;AAROM;10  reps Goniometric ROM: ~10 to     General Comments        Pertinent Vitals/Pain Pain Assessment: 0-10 Pain Score: 3  Pain Location: right knee Pain Descriptors / Indicators: Tightness;Sore Pain Intervention(s): Limited activity within patient's tolerance;Monitored during session;Premedicated before session;Ice applied    Home Living                      Prior Function            PT Goals (current goals can now be found in the care plan section) Acute Rehab PT Goals PT Goal Formulation: With patient Time For Goal Achievement: 10/04/15 Potential to Achieve Goals: Good Progress towards PT goals: Progressing toward goals    Frequency  7X/week    PT Plan Current plan remains appropriate    Co-evaluation             End of Session Equipment Utilized During Treatment: Gait belt Activity Tolerance: Patient tolerated treatment well Patient left: in chair;with call bell/phone within reach     Time: [id number] PT Time Calculation (min) (ACUTE ONLY): 27 min  Charges:  $Gait Training: 8-22 mins $Therapeutic Exercise: 8-22 mins                    G Codes:      Zainab Crumrine 10-17-2015, 9:29 AM

## 2015-10-02 NOTE — Progress Notes (Signed)
     Subjective: 2 Days Post-Op Procedure(s) (LRB): TOTAL RIGHT KNEE ARTHROPLASTY (Right)   Patient reports pain as mild, pain much better controlled today.  States she notices that her BP is elevated soon after activity, which she says correlates with her increase in pain.  No events otherwise throughout the night.  She states she feels that she is ready to be discharged home.  Objective:   VITALS:   Filed Vitals:   10/02/15 0500 10/02/15 0528  BP: 207/102 154/84  Pulse: 93   Temp: 98.7 F (37.1 C)   Resp: 18     Dorsiflexion/Plantar flexion intact Incision: dressing C/D/I No cellulitis present Compartment soft  LABS  Recent Labs  10/01/15 0428 10/02/15 0418  HGB 11.3* 10.6*  HCT 36.0 33.2*  WBC 12.8* 12.6*  PLT 240 246     Recent Labs  10/01/15 0428 10/02/15 0418  NA 137 136  K 3.5 3.7  BUN 12 15  CREATININE 0.58 0.77  GLUCOSE 193* 283*     Assessment/Plan: 2 Days Post-Op Procedure(s) (LRB): TOTAL RIGHT KNEE ARTHROPLASTY (Right) Up with therapy Discharge home with home health  Follow up in 2 weeks at [facility]. Follow up with OLIN,Jenalee Trevizo D in 2 weeks.  Contact information:  [facility] [address], [place] [id number]    Overweight (BMI 25-29.9) Estimated body mass index is 27.11 kg/(m^2) as calculated from the following:   Height as of this encounter: 5\' 3"  (1.6 m).   Weight as of this encounter: 69.4 kg (153 lb). Patient also counseled that weight may inhibit the healing process Patient counseled that losing weight will help with future health issues         West Pugh. Frida Wahlstrom   PAC  10/02/2015, 8:02 AM

## 2015-10-06 NOTE — Discharge Summary (Signed)
Physician Discharge Summary  Patient ID: Misty Morris MRN: [id number] DOB/AGE: 1950/11/12 65 y.o.  Admit date: 09/30/2015 Discharge date: 10/02/2015   Procedures:  Procedure(s) (LRB): TOTAL RIGHT KNEE ARTHROPLASTY (Right)  Attending Physician:  Dr. Paralee Cancel   Admission Diagnoses:   Right knee primary OA / pain  Discharge Diagnoses:  Principal Problem:   S/P right TKA Active Problems:   Overweight (BMI 25.0-29.9)  Past Medical History  Diagnosis Date  . Hypertension   . Shingles   . Polyarthritis rheumatica (HCC)     RA  . GERD (gastroesophageal reflux disease)   . Diabetes mellitus without complication ([place])     diet controlled    HPI:    Misty Morris, 65 y.o. female, has a history of pain and functional disability in the right knee due to arthritis and has failed non-surgical conservative treatments for greater than 12 weeks to include NSAID's and/or analgesics, corticosteriod injections, viscosupplementation injections, use of assistive devices and activity modification. Onset of symptoms was gradual, starting 1.5+ years ago with gradually worsening course since that time. The patient noted no past surgery on the right knee(s). Patient currently rates pain in the right knee(s) at 9 out of 10 with activity. Patient has worsening of pain with activity and weight bearing, pain that interferes with activities of daily living, pain with passive range of motion, crepitus and joint swelling. Patient has evidence of periarticular osteophytes and joint space narrowing by imaging studies. There is no active infection. Risks, benefits and expectations were discussed with the patient. Risks including but not limited to the risk of anesthesia, blood clots, nerve damage, blood vessel damage, failure of the prosthesis, infection and up to and including death. Patient understand the risks, benefits and expectations and wishes to proceed with surgery.   PCP: Donnie Coffin, MD    Discharged Condition: good  Hospital Course:  Patient underwent the above stated procedure on 09/30/2015. Patient tolerated the procedure well and brought to the recovery room in good condition and subsequently to the floor.  POD #1 BP: 192/89 ; Pulse: 84 ; Temp: 98.2 F (36.8 C) ; Resp: 16 Patient reports pain as moderate, pain controlled with medication. States that she only took one pill this AM, but after they started to move her leg she has requested the second pain pill. No events throughout the night. Dorsiflexion/plantar flexion intact, incision: dressing C/D/I, no cellulitis present and compartment soft.   LABS  Basename    HGB     11.3  HCT     36.0   POD #2  BP: 154/84 ; Pulse: 93 ; Temp: 98.7 F (37.1 C) ; Resp: 18 Patient reports pain as mild, pain much better controlled today. States she notices that her BP is elevated soon after activity, which she says correlates with her increase in pain. No events otherwise throughout the night. She states she feels that she is ready to be discharged home. Dorsiflexion/plantar flexion intact, incision: dressing C/D/I, no cellulitis present and compartment soft.   LABS  Basename    HGB     10.6  HCT     33.2    Discharge Exam: General appearance: alert, cooperative and no distress Extremities: Homans sign is negative, no sign of DVT, no edema, redness or tenderness in the calves or thighs and no ulcers, gangrene or trophic changes  Disposition: Home with follow up in 2 weeks   Follow-up Information    Schedule an appointment as soon as possible  for a visit with Mauri Pole, MD.   Specialty:  Orthopedic Surgery   Contact information:   [address] [place] [address] [phone]       Follow up with [facility].   Why:  home health physical therapy   Contact information:   [place] [address] [phone]       Follow up with [place].   Why:  rolling walker   Contact information:   [address] [place] [address] [phone]       Discharge Instructions    Call MD / Call 911    Complete by:  As directed   If you experience chest pain or shortness of breath, CALL 911 and be transported to the hospital emergency room.  If you develope a fever above 101 F, pus (white drainage) or increased drainage or redness at the wound, or calf pain, call your surgeon's office.     Change dressing    Complete by:  As directed   Maintain surgical dressing until follow up in the clinic. If the edges start to pull up, may reinforce with tape. If the dressing is no longer working, may remove and cover with gauze and tape, but must keep the area dry and clean.  Call with any questions or concerns.     Constipation Prevention    Complete by:  As directed   Drink plenty of fluids.  Prune juice may be helpful.  You may use a stool softener, such as Colace (over the counter) 100 mg twice a day.  Use MiraLax (over the counter) for constipation as needed.     Diet - low sodium heart healthy    Complete by:  As directed      Discharge instructions    Complete by:  As directed   Maintain surgical dressing until follow up in the clinic. If the edges start to pull up, may reinforce with tape. If the dressing is no longer working, may remove and cover with gauze and tape, but must keep the area dry and clean.  Follow up in 2 weeks at [facility]. Call with any questions or concerns.     Increase activity slowly as tolerated    Complete by:  As directed   Weight bearing as tolerated with assist device (walker, cane, etc) as directed, use it as long as suggested by your surgeon or therapist, typically at least 4-6 weeks.     TED hose    Complete by:  As directed   Use stockings (TED hose) for 2 weeks on both leg(s).  You may remove them at night for sleeping.             Medication List    STOP taking these  medications        acetaminophen 500 MG tablet  Commonly known as:  TYLENOL     HYDROcodone-acetaminophen 5-325 MG tablet  Commonly known as:  NORCO/VICODIN     leflunomide 10 MG tablet  Commonly known as:  ARAVA      TAKE these medications        Alpha-Lipoic Acid 200 MG Caps  Take 200 mg by mouth daily.     amLODipine 5 MG tablet  Commonly known as:  NORVASC  Take 5 mg by mouth daily. Patient takes in the pm     aspirin 325 MG EC tablet  Take 1 tablet (325  mg total) by mouth 2 (two) times daily.     B-complex with vitamin C tablet  Take 1 tablet by mouth daily.     BIOTIN PO  Take 1,000 mg by mouth daily.     CALCIUM 500 1250 (500 Ca) MG tablet  Generic drug:  calcium carbonate  Take 1 tablet by mouth daily.     cholecalciferol 1000 units tablet  Commonly known as:  VITAMIN D  Take 1,000 Units by mouth daily.     Chromium Picolinate 200 MCG Caps  Take 200 mcg by mouth daily.     docusate sodium 100 MG capsule  Commonly known as:  COLACE  Take 1 capsule (100 mg total) by mouth 2 (two) times daily.     ferrous sulfate 325 (65 FE) MG tablet  Take 1 tablet (325 mg total) by mouth 3 (three) times daily after meals.     FISH OIL PO  Take 1 capsule by mouth daily.     HYDROmorphone 2 MG tablet  Commonly known as:  DILAUDID  Take 1-2 tablets (2-4 mg total) by mouth every 4 (four) hours as needed for severe pain.     methocarbamol 500 MG tablet  Commonly known as:  ROBAXIN  Take 1 tablet (500 mg total) by mouth every 6 (six) hours as needed for muscle spasms.     polyethylene glycol packet  Commonly known as:  MIRALAX / GLYCOLAX  Take 17 g by mouth 2 (two) times daily.     predniSONE 5 MG tablet  Commonly known as:  DELTASONE  Take 7.5-10 mg by mouth 2 (two) times daily with a meal. Takes 7.5 mg in the AM and 10mg  in the PM.     timolol 0.5 % ophthalmic solution  Commonly known as:  TIMOPTIC  Place 1 drop into both eyes daily.     ZIOPTAN 0.0015 % Soln    Generic drug:  Tafluprost  Place 1 drop into both eyes at bedtime.         Signed: West Pugh. Erland Vivas   PA-C  10/06/2015, 10:13 AM

## 2015-10-16 ENCOUNTER — Ambulatory Visit (HOSPITAL_COMMUNITY)
Admission: RE | Admit: 2015-10-16 | Discharge: 2015-10-16 | Disposition: A | Discharge status: Home / Self Care | Payer: PPO | Source: Ambulatory Visit | Attending: Cardiology | Admitting: Cardiology

## 2015-10-16 ENCOUNTER — Other Ambulatory Visit (HOSPITAL_COMMUNITY): Payer: Self-pay | Admitting: Physician Assistant

## 2015-10-16 DIAGNOSIS — E119 Type 2 diabetes mellitus without complications: Secondary | ICD-10-CM | POA: Diagnosis not present

## 2015-10-16 DIAGNOSIS — M79604 Pain in right leg: Secondary | ICD-10-CM | POA: Insufficient documentation

## 2015-10-16 DIAGNOSIS — I10 Essential (primary) hypertension: Secondary | ICD-10-CM | POA: Insufficient documentation

## 2015-10-22 DIAGNOSIS — M1711 Unilateral primary osteoarthritis, right knee: Secondary | ICD-10-CM | POA: Diagnosis not present

## 2015-10-24 DIAGNOSIS — M1711 Unilateral primary osteoarthritis, right knee: Secondary | ICD-10-CM | POA: Diagnosis not present

## 2015-10-28 DIAGNOSIS — M1711 Unilateral primary osteoarthritis, right knee: Secondary | ICD-10-CM | POA: Diagnosis not present

## 2015-10-30 DIAGNOSIS — M1711 Unilateral primary osteoarthritis, right knee: Secondary | ICD-10-CM | POA: Diagnosis not present

## 2015-11-04 DIAGNOSIS — M1711 Unilateral primary osteoarthritis, right knee: Secondary | ICD-10-CM | POA: Diagnosis not present

## 2015-11-06 DIAGNOSIS — M1711 Unilateral primary osteoarthritis, right knee: Secondary | ICD-10-CM | POA: Diagnosis not present

## 2015-11-11 DIAGNOSIS — M1711 Unilateral primary osteoarthritis, right knee: Secondary | ICD-10-CM | POA: Diagnosis not present

## 2015-11-13 DIAGNOSIS — Z471 Aftercare following joint replacement surgery: Secondary | ICD-10-CM | POA: Diagnosis not present

## 2015-11-13 DIAGNOSIS — M1711 Unilateral primary osteoarthritis, right knee: Secondary | ICD-10-CM | POA: Diagnosis not present

## 2015-11-13 DIAGNOSIS — Z96651 Presence of right artificial knee joint: Secondary | ICD-10-CM | POA: Diagnosis not present

## 2015-11-18 DIAGNOSIS — M1711 Unilateral primary osteoarthritis, right knee: Secondary | ICD-10-CM | POA: Diagnosis not present

## 2015-11-20 DIAGNOSIS — M1711 Unilateral primary osteoarthritis, right knee: Secondary | ICD-10-CM | POA: Diagnosis not present

## 2015-12-16 DIAGNOSIS — I10 Essential (primary) hypertension: Secondary | ICD-10-CM | POA: Diagnosis not present

## 2015-12-16 DIAGNOSIS — R609 Edema, unspecified: Secondary | ICD-10-CM | POA: Diagnosis not present

## 2015-12-17 DIAGNOSIS — M1990 Unspecified osteoarthritis, unspecified site: Secondary | ICD-10-CM | POA: Diagnosis not present

## 2015-12-17 DIAGNOSIS — Z79899 Other long term (current) drug therapy: Secondary | ICD-10-CM | POA: Diagnosis not present

## 2015-12-17 DIAGNOSIS — M25561 Pain in right knee: Secondary | ICD-10-CM | POA: Diagnosis not present

## 2015-12-17 DIAGNOSIS — M353 Polymyalgia rheumatica: Secondary | ICD-10-CM | POA: Diagnosis not present

## 2015-12-23 DIAGNOSIS — D72829 Elevated white blood cell count, unspecified: Secondary | ICD-10-CM | POA: Diagnosis not present

## 2015-12-23 DIAGNOSIS — I10 Essential (primary) hypertension: Secondary | ICD-10-CM | POA: Diagnosis not present

## 2016-01-15 DIAGNOSIS — M1990 Unspecified osteoarthritis, unspecified site: Secondary | ICD-10-CM | POA: Diagnosis not present

## 2016-01-15 DIAGNOSIS — M353 Polymyalgia rheumatica: Secondary | ICD-10-CM | POA: Diagnosis not present

## 2016-01-15 DIAGNOSIS — Z79899 Other long term (current) drug therapy: Secondary | ICD-10-CM | POA: Diagnosis not present

## 2016-01-15 DIAGNOSIS — M25561 Pain in right knee: Secondary | ICD-10-CM | POA: Diagnosis not present

## 2016-02-12 DIAGNOSIS — M353 Polymyalgia rheumatica: Secondary | ICD-10-CM | POA: Diagnosis not present

## 2016-02-12 DIAGNOSIS — M1990 Unspecified osteoarthritis, unspecified site: Secondary | ICD-10-CM | POA: Diagnosis not present

## 2016-02-12 DIAGNOSIS — M25561 Pain in right knee: Secondary | ICD-10-CM | POA: Diagnosis not present

## 2016-02-12 DIAGNOSIS — Z79899 Other long term (current) drug therapy: Secondary | ICD-10-CM | POA: Diagnosis not present

## 2016-03-11 DIAGNOSIS — M25561 Pain in right knee: Secondary | ICD-10-CM | POA: Diagnosis not present

## 2016-03-11 DIAGNOSIS — Z79899 Other long term (current) drug therapy: Secondary | ICD-10-CM | POA: Diagnosis not present

## 2016-03-11 DIAGNOSIS — M1990 Unspecified osteoarthritis, unspecified site: Secondary | ICD-10-CM | POA: Diagnosis not present

## 2016-03-11 DIAGNOSIS — M353 Polymyalgia rheumatica: Secondary | ICD-10-CM | POA: Diagnosis not present

## 2016-03-23 DIAGNOSIS — Z23 Encounter for immunization: Secondary | ICD-10-CM | POA: Diagnosis not present

## 2016-03-23 DIAGNOSIS — E119 Type 2 diabetes mellitus without complications: Secondary | ICD-10-CM | POA: Diagnosis not present

## 2016-03-23 DIAGNOSIS — E782 Mixed hyperlipidemia: Secondary | ICD-10-CM | POA: Diagnosis not present

## 2016-03-23 DIAGNOSIS — M353 Polymyalgia rheumatica: Secondary | ICD-10-CM | POA: Diagnosis not present

## 2016-03-23 DIAGNOSIS — F430 Acute stress reaction: Secondary | ICD-10-CM | POA: Diagnosis not present

## 2016-03-23 DIAGNOSIS — I10 Essential (primary) hypertension: Secondary | ICD-10-CM | POA: Diagnosis not present

## 2016-04-13 DIAGNOSIS — M353 Polymyalgia rheumatica: Secondary | ICD-10-CM | POA: Diagnosis not present

## 2016-04-13 DIAGNOSIS — M25561 Pain in right knee: Secondary | ICD-10-CM | POA: Diagnosis not present

## 2016-04-13 DIAGNOSIS — M1990 Unspecified osteoarthritis, unspecified site: Secondary | ICD-10-CM | POA: Diagnosis not present

## 2016-04-13 DIAGNOSIS — M069 Rheumatoid arthritis, unspecified: Secondary | ICD-10-CM | POA: Diagnosis not present

## 2016-05-11 DIAGNOSIS — Z79899 Other long term (current) drug therapy: Secondary | ICD-10-CM | POA: Diagnosis not present

## 2016-05-11 DIAGNOSIS — M069 Rheumatoid arthritis, unspecified: Secondary | ICD-10-CM | POA: Diagnosis not present

## 2016-05-11 DIAGNOSIS — M353 Polymyalgia rheumatica: Secondary | ICD-10-CM | POA: Diagnosis not present

## 2016-05-11 DIAGNOSIS — M1990 Unspecified osteoarthritis, unspecified site: Secondary | ICD-10-CM | POA: Diagnosis not present

## 2016-06-03 DIAGNOSIS — Z961 Presence of intraocular lens: Secondary | ICD-10-CM | POA: Diagnosis not present

## 2016-06-03 DIAGNOSIS — H524 Presbyopia: Secondary | ICD-10-CM | POA: Diagnosis not present

## 2016-06-03 DIAGNOSIS — E119 Type 2 diabetes mellitus without complications: Secondary | ICD-10-CM | POA: Diagnosis not present

## 2016-06-22 DIAGNOSIS — M069 Rheumatoid arthritis, unspecified: Secondary | ICD-10-CM | POA: Diagnosis not present

## 2016-06-22 DIAGNOSIS — M353 Polymyalgia rheumatica: Secondary | ICD-10-CM | POA: Diagnosis not present

## 2016-06-22 DIAGNOSIS — M1990 Unspecified osteoarthritis, unspecified site: Secondary | ICD-10-CM | POA: Diagnosis not present

## 2016-06-22 DIAGNOSIS — Z79899 Other long term (current) drug therapy: Secondary | ICD-10-CM | POA: Diagnosis not present

## 2016-06-24 DIAGNOSIS — E119 Type 2 diabetes mellitus without complications: Secondary | ICD-10-CM | POA: Diagnosis not present

## 2016-06-24 DIAGNOSIS — I10 Essential (primary) hypertension: Secondary | ICD-10-CM | POA: Diagnosis not present

## 2016-06-24 DIAGNOSIS — M353 Polymyalgia rheumatica: Secondary | ICD-10-CM | POA: Diagnosis not present

## 2016-07-13 DIAGNOSIS — M797 Fibromyalgia: Secondary | ICD-10-CM | POA: Diagnosis not present

## 2016-08-10 DIAGNOSIS — M8580 Other specified disorders of bone density and structure, unspecified site: Secondary | ICD-10-CM | POA: Diagnosis not present

## 2016-08-10 DIAGNOSIS — Z79899 Other long term (current) drug therapy: Secondary | ICD-10-CM | POA: Diagnosis not present

## 2016-08-10 DIAGNOSIS — M353 Polymyalgia rheumatica: Secondary | ICD-10-CM | POA: Diagnosis not present

## 2016-08-10 DIAGNOSIS — M1990 Unspecified osteoarthritis, unspecified site: Secondary | ICD-10-CM | POA: Diagnosis not present

## 2016-08-10 DIAGNOSIS — M797 Fibromyalgia: Secondary | ICD-10-CM | POA: Diagnosis not present

## 2016-08-10 DIAGNOSIS — M79644 Pain in right finger(s): Secondary | ICD-10-CM | POA: Diagnosis not present

## 2016-08-27 DIAGNOSIS — M1712 Unilateral primary osteoarthritis, left knee: Secondary | ICD-10-CM | POA: Diagnosis not present

## 2016-10-27 DIAGNOSIS — M25562 Pain in left knee: Secondary | ICD-10-CM | POA: Diagnosis not present

## 2016-10-27 DIAGNOSIS — Z471 Aftercare following joint replacement surgery: Secondary | ICD-10-CM | POA: Diagnosis not present

## 2016-10-27 DIAGNOSIS — Z96651 Presence of right artificial knee joint: Secondary | ICD-10-CM | POA: Diagnosis not present

## 2016-11-02 DIAGNOSIS — B351 Tinea unguium: Secondary | ICD-10-CM | POA: Diagnosis not present

## 2016-11-02 DIAGNOSIS — E782 Mixed hyperlipidemia: Secondary | ICD-10-CM | POA: Diagnosis not present

## 2016-11-02 DIAGNOSIS — E119 Type 2 diabetes mellitus without complications: Secondary | ICD-10-CM | POA: Diagnosis not present

## 2016-11-02 DIAGNOSIS — M353 Polymyalgia rheumatica: Secondary | ICD-10-CM | POA: Diagnosis not present

## 2016-11-02 DIAGNOSIS — I10 Essential (primary) hypertension: Secondary | ICD-10-CM | POA: Diagnosis not present

## 2016-11-08 DIAGNOSIS — M353 Polymyalgia rheumatica: Secondary | ICD-10-CM | POA: Diagnosis not present

## 2016-11-08 DIAGNOSIS — M1990 Unspecified osteoarthritis, unspecified site: Secondary | ICD-10-CM | POA: Diagnosis not present

## 2016-11-08 DIAGNOSIS — Z79899 Other long term (current) drug therapy: Secondary | ICD-10-CM | POA: Diagnosis not present

## 2016-11-08 DIAGNOSIS — M797 Fibromyalgia: Secondary | ICD-10-CM | POA: Diagnosis not present

## 2016-11-26 DIAGNOSIS — M1811 Unilateral primary osteoarthritis of first carpometacarpal joint, right hand: Secondary | ICD-10-CM | POA: Diagnosis not present

## 2016-11-26 DIAGNOSIS — M1812 Unilateral primary osteoarthritis of first carpometacarpal joint, left hand: Secondary | ICD-10-CM | POA: Diagnosis not present

## 2016-12-02 DIAGNOSIS — H401131 Primary open-angle glaucoma, bilateral, mild stage: Secondary | ICD-10-CM | POA: Diagnosis not present

## 2016-12-24 DIAGNOSIS — M1812 Unilateral primary osteoarthritis of first carpometacarpal joint, left hand: Secondary | ICD-10-CM | POA: Diagnosis not present

## 2016-12-24 DIAGNOSIS — M1811 Unilateral primary osteoarthritis of first carpometacarpal joint, right hand: Secondary | ICD-10-CM | POA: Diagnosis not present

## 2016-12-29 NOTE — H&P (Signed)
UNICOMPARTMENTAL KNEE ADMISSION H&P  Patient is being admitted for left medial unicompartmental knee arthroplasty.  Subjective:  Chief Complaint:  Left knee medial compartmental primary OA /pain    HPI: Misty Morris, 66 y.o. female female, has a history of pain and functional disability in the left and has failed non-surgical conservative treatments for greater than 12 weeks to include NSAID's and/or analgesics, corticosteriod injections and activity modification.  Onset of symptoms was abrupt, starting January 2018 years ago with rapidlly worsening course since that time. The patient noted prior procedures on the knee to include  arthroplasty on the right knee(s).  Patient currently rates pain in the left knee(s) at 10 out of 10 with activity. Patient has worsening of pain with activity and weight bearing, pain that interferes with activities of daily living, pain with passive range of motion, crepitus and joint swelling.  Patient has evidence of periarticular osteophytes and joint space narrowing of the medial compartment by imaging studies.  There is no active infection.  Risks, benefits and expectations were discussed with the patient.  Risks including but not limited to the risk of anesthesia, blood clots, nerve damage, blood vessel damage, failure of the prosthesis, infection and up to and including death.  Patient understand the risks, benefits and expectations and wishes to proceed with surgery.   PCP: Alroy Dust, L.Marlou Sa, MD  D/C Plans:      Home with HHPT  Post-op Meds:       No Rx given  Tranexamic Acid:      To be given - IV   Decadron:    It is to be given  FYI:     ASA  Dilaudid PO & IV  DME: Already has equipment  PT:   No PT   Past Medical History:  Diagnosis Date  . Diabetes mellitus without complication (HCC)    diet controlled  . GERD (gastroesophageal reflux disease)   . Hypertension   . Polyarthritis rheumatica (HCC)    RA  . Shingles      Past  Surgical History:  Procedure Laterality Date  . ABDOMINAL HYSTERECTOMY    . [place] 1  1980's  . RHINOPLASTY    . TOTAL KNEE ARTHROPLASTY Right 09/30/2015   Procedure: TOTAL RIGHT KNEE ARTHROPLASTY;  Surgeon: Paralee Cancel, MD;  Location: [facility];  Service: Orthopedics;  Laterality: Right;    Allergies  Allergen Reactions  . Latex Other (See Comments)    Blistering   . Codeine Hives  . Demerol [Meperidine] Hives  . Morphine And Related Hives  . Sulfa Antibiotics Hives  . Tramadol Hives  . Vicodin [Hydrocodone-Acetaminophen] Itching    Elevated blood pressure. Tolerates low doses.    Social History  Substance Use Topics  . Smoking status: Former Smoker    Types: Cigarettes    Quit date: 12/27/2011  . Smokeless tobacco: Never Used  . Alcohol use No       Review of Systems  Constitutional: Positive for malaise/fatigue.  HENT: Negative.   Eyes: Negative.   Respiratory: Negative.   Cardiovascular: Negative.   Gastrointestinal: Positive for heartburn.  Genitourinary: Negative.   Musculoskeletal: Positive for joint pain and myalgias.  Skin: Negative.   Neurological: Negative.   Endo/Heme/Allergies: Negative.   Psychiatric/Behavioral: Negative.      Objective:   Physical Exam  Constitutional: She is well-developed, well-nourished, and in no distress.  Eyes: Pupils are equal, round, and reactive to light.  Neck: Neck supple. No JVD present. No tracheal deviation  present. No thyromegaly present.  Cardiovascular: Normal rate, regular rhythm and intact distal pulses.   Pulmonary/Chest: Effort normal and breath sounds normal. No respiratory distress. She has no wheezes.  Abdominal: Soft. There is no tenderness. There is no guarding.  Musculoskeletal:       Left knee: She exhibits decreased range of motion, swelling and bony tenderness. She exhibits no ecchymosis, no deformity, no laceration and no erythema. Tenderness found. Medial joint line tenderness noted. No lateral  joint line tenderness noted.  Lymphadenopathy:    She has no cervical adenopathy.  Neurological: She is alert.  Skin: Skin is warm and dry.       Labs:  Estimated body mass index is 27.1 kg/m as calculated from the following:   Height as of 09/30/15: 5\' 3"  (1.6 m).   Weight as of 09/30/15: 69.4 kg (153 lb).   Imaging Review Plain radiographs demonstrate severe degenerative joint disease of the left knee(s) medial compartment. The overall alignment is neutral. The bone quality appears to be good for age and reported activity level.  Assessment/Plan:  End stage arthritis, left knee medial compartment  The patient history, physical examination, clinical judgment of the provider and imaging studies are consistent with end stage degenerative joint disease of the left knee(s) and medial unicompartmental knee arthroplasty is deemed medically necessary. The treatment options including medical management, injection therapy arthroscopy and arthroplasty were discussed at length. The risks and benefits of total knee arthroplasty were presented and reviewed. The risks due to aseptic loosening, infection, stiffness, patella tracking problems, thromboembolic complications and other imponderables were discussed. The patient acknowledged the explanation, agreed to proceed with the plan and consent was signed. Patient is being admitted for outpatient / observation treatment for surgery, pain control, PT, OT, prophylactic antibiotics, VTE prophylaxis, progressive ambulation and ADL's and discharge planning. The patient is planning to be discharged home with home health services.    [place] Pugh Kessler Kopinski   PA-C  12/29/2016, 2:21 PM

## 2017-01-11 NOTE — Patient Instructions (Addendum)
Misty Morris  01/11/2017   Your procedure is scheduled on: 01-17-17  Report to [facility] Main  Entrance Follow Signs to Short Stay on First floor at 5:15 AM    Call this number if you have problems the morning of surgery [phone]   Remember: ONLY 1 PERSON MAY GO WITH YOU TO SHORT STAY TO GET  READY MORNING OF [place].  Do not eat food or drink liquids :After Midnight.     Take these medicines the morning of surgery with A SIP OF WATER: None                                You may not have any metal on your body including hair pins and              piercings  Do not wear jewelry, make-up, lotions, powders or perfumes, deodorant             Do not wear nail polish.  Do not shave  48 hours prior to surgery.                Do not bring valuables to the hospital. [name].  Contacts, dentures or bridgework may not be worn into surgery.  Leave suitcase in the car. After surgery it may be brought to your room.                 Please read over the following fact sheets you were given: _____________________________________________________________________  [facility] - Preparing for Surgery Before surgery, you can play an important role.  Because skin is not sterile, your skin needs to be as free of germs as possible.  You can reduce the number of germs on your skin by washing with CHG (chlorahexidine gluconate) soap before surgery.  CHG is an antiseptic cleaner which kills germs and bonds with the skin to continue killing germs even after washing. Please DO NOT use if you have an allergy to CHG or antibacterial soaps.  If your skin becomes reddened/irritated stop using the CHG and inform your nurse when you arrive at Short Stay. Do not shave (including legs and underarms) for at least 48 hours prior to the first CHG shower.  You may shave your face/neck. Please follow these instructions carefully:  1.   Shower with CHG Soap the night before surgery and the  morning of Surgery.  2.  If you choose to wash your hair, wash your hair first as usual with your  normal  shampoo.  3.  After you shampoo, rinse your hair and body thoroughly to remove the  shampoo.                           4.  Use CHG as you would any other liquid soap.  You can apply chg directly  to the skin and wash                       Gently with a scrungie or clean washcloth.  5.  Apply the CHG Soap to your body ONLY FROM THE NECK DOWN.   Do not use on face/ open  Wound or open sores. Avoid contact with eyes, ears mouth and genitals (private parts).                       Wash face,  Genitals (private parts) with your normal soap.             6.  Wash thoroughly, paying special attention to the area where your surgery  will be performed.  7.  Thoroughly rinse your body with warm water from the neck down.  8.  DO NOT shower/wash with your normal soap after using and rinsing off  the CHG Soap.                9.  Pat yourself dry with a clean towel.            10.  Wear clean pajamas.            11.  Place clean sheets on your bed the night of your first shower and do not  sleep with pets. Day of Surgery : Do not apply any lotions/deodorants the morning of surgery.  Please wear clean clothes to the hospital/surgery center.  FAILURE TO FOLLOW THESE INSTRUCTIONS MAY RESULT IN THE CANCELLATION OF YOUR SURGERY PATIENT SIGNATURE_________________________________  NURSE SIGNATURE__________________________________  ________________________________________________________________________   Misty Morris  An incentive spirometer is a tool that can help keep your lungs clear and active. This tool measures how well you are filling your lungs with each breath. Taking long deep breaths may help reverse or decrease the chance of developing breathing (pulmonary) problems (especially infection) following:  A long  period of time when you are unable to move or be active. BEFORE THE PROCEDURE   If the spirometer includes an indicator to show your best effort, your nurse or respiratory therapist will set it to a desired goal.  If possible, sit up straight or lean slightly forward. Try not to slouch.  Hold the incentive spirometer in an upright position. INSTRUCTIONS FOR USE  1. Sit on the edge of your bed if possible, or sit up as far as you can in bed or on a chair. 2. Hold the incentive spirometer in an upright position. 3. Breathe out normally. 4. Place the mouthpiece in your mouth and seal your lips tightly around it. 5. Breathe in slowly and as deeply as possible, raising the piston or the ball toward the top of the column. 6. Hold your breath for 3-5 seconds or for as long as possible. Allow the piston or ball to fall to the bottom of the column. 7. Remove the mouthpiece from your mouth and breathe out normally. 8. Rest for a few seconds and repeat Steps 1 through 7 at least 10 times every 1-2 hours when you are awake. Take your time and take a few normal breaths between deep breaths. 9. The spirometer may include an indicator to show your best effort. Use the indicator as a goal to work toward during each repetition. 10. After each set of 10 deep breaths, practice coughing to be sure your lungs are clear. If you have an incision (the cut made at the time of surgery), support your incision when coughing by placing a pillow or rolled up towels firmly against it. Once you are able to get out of bed, walk around indoors and cough well. You may stop using the incentive spirometer when instructed by your caregiver.  RISKS AND COMPLICATIONS  Take your time so you do not get  dizzy or light-headed.  If you are in pain, you may need to take or ask for pain medication before doing incentive spirometry. It is harder to take a deep breath if you are having pain. AFTER USE  Rest and breathe slowly and  easily.  It can be helpful to keep track of a log of your progress. Your caregiver can provide you with a simple table to help with this. If you are using the spirometer at home, follow these instructions: [place] IF:   You are having difficultly using the spirometer.  You have trouble using the spirometer as often as instructed.  Your pain medication is not giving enough relief while using the spirometer.  You develop fever of 100.5 F (38.1 C) or higher. SEEK IMMEDIATE MEDICAL CARE IF:   You cough up bloody sputum that had not been present before.  You develop fever of 102 F (38.9 C) or greater.  You develop worsening pain at or near the incision site. MAKE SURE YOU:   Understand these instructions.  Will watch your condition.  Will get help right away if you are not doing well or get worse. Document Released: 12/06/2006 Document Revised: 10/18/2011 Document Reviewed: 02/06/2007 Exit[employer] Patient Information 2014 Exit[employer], [place].   ________________________________________________________________________  WHAT IS A BLOOD TRANSFUSION? Blood Transfusion Information  A transfusion is the replacement of blood or some of its parts. Blood is made up of multiple cells which provide different functions.  Red blood cells carry oxygen and are used for blood loss replacement.  White blood cells fight against infection.  Platelets control bleeding.  Plasma helps clot blood.  Other blood products are available for specialized needs, such as hemophilia or other clotting disorders. BEFORE THE TRANSFUSION  Who gives blood for transfusions?   Healthy volunteers who are fully evaluated to make sure their blood is safe. This is blood bank blood. Transfusion therapy is the safest it has ever been in the practice of medicine. Before blood is taken from a donor, a complete history is taken to make sure that person has no history of diseases nor engages in risky social  behavior (examples are intravenous drug use or sexual activity with multiple partners). The donor's travel history is screened to minimize risk of transmitting infections, such as malaria. The donated blood is tested for signs of infectious diseases, such as HIV and hepatitis. The blood is then tested to be sure it is compatible with you in order to minimize the chance of a transfusion reaction. If you or a relative donates blood, this is often done in anticipation of surgery and is not appropriate for emergency situations. It takes many days to process the donated blood. RISKS AND COMPLICATIONS Although transfusion therapy is very safe and saves many lives, the main dangers of transfusion include:   Getting an infectious disease.  Developing a transfusion reaction. This is an allergic reaction to something in the blood you were given. Every precaution is taken to prevent this. The decision to have a blood transfusion has been considered carefully by your caregiver before blood is given. Blood is not given unless the benefits outweigh the risks. AFTER THE TRANSFUSION  Right after receiving a blood transfusion, you will usually feel much better and more energetic. This is especially true if your red blood cells have gotten low (anemic). The transfusion raises the level of the red blood cells which carry oxygen, and this usually causes an energy increase.  The nurse administering the transfusion will  monitor you carefully for complications. HOME CARE INSTRUCTIONS  No special instructions are needed after a transfusion. You may find your energy is better. Speak with your caregiver about any limitations on activity for underlying diseases you may have. SEEK MEDICAL CARE IF:   Your condition is not improving after your transfusion.  You develop redness or irritation at the intravenous (IV) site. SEEK IMMEDIATE MEDICAL CARE IF:  Any of the following symptoms occur over the next 12 hours:  Shaking  chills.  You have a temperature by mouth above 102 F (38.9 C), not controlled by medicine.  Chest, back, or muscle pain.  People around you feel you are not acting correctly or are confused.  Shortness of breath or difficulty breathing.  Dizziness and fainting.  You get a rash or develop hives.  You have a decrease in urine output.  Your urine turns a dark color or changes to pink, red, or brown. Any of the following symptoms occur over the next 10 days:  You have a temperature by mouth above 102 F (38.9 C), not controlled by medicine.  Shortness of breath.  Weakness after normal activity.  The white part of the eye turns yellow (jaundice).  You have a decrease in the amount of urine or are urinating less often.  Your urine turns a dark color or changes to pink, red, or brown. Document Released: 07/23/2000 Document Revised: 10/18/2011 Document Reviewed: 03/11/2008 [facility] Patient Information 2014 [place], [place].  _______________________________________________________________________

## 2017-01-12 ENCOUNTER — Encounter (HOSPITAL_COMMUNITY)
Admission: RE | Admit: 2017-01-12 | Discharge: 2017-01-12 | Disposition: A | Discharge status: Home / Self Care | Payer: PPO | Source: Ambulatory Visit | Attending: Orthopedic Surgery | Admitting: Orthopedic Surgery

## 2017-01-12 ENCOUNTER — Encounter (HOSPITAL_COMMUNITY): Payer: Self-pay

## 2017-01-12 DIAGNOSIS — I10 Essential (primary) hypertension: Secondary | ICD-10-CM | POA: Diagnosis not present

## 2017-01-12 DIAGNOSIS — E119 Type 2 diabetes mellitus without complications: Secondary | ICD-10-CM | POA: Insufficient documentation

## 2017-01-12 DIAGNOSIS — Z01812 Encounter for preprocedural laboratory examination: Secondary | ICD-10-CM | POA: Diagnosis not present

## 2017-01-12 DIAGNOSIS — Z01810 Encounter for preprocedural cardiovascular examination: Secondary | ICD-10-CM | POA: Insufficient documentation

## 2017-01-12 LAB — SURGICAL PCR SCREEN
MRSA, PCR: NEGATIVE
STAPHYLOCOCCUS AUREUS: NEGATIVE

## 2017-01-12 LAB — BASIC METABOLIC PANEL
Anion gap: 8 (ref 5–15)
BUN: 16 mg/dL (ref 6–20)
CO2: 27 mmol/L (ref 22–32)
CREATININE: 0.74 mg/dL (ref 0.44–1.00)
Calcium: 9.5 mg/dL (ref 8.9–10.3)
Chloride: 106 mmol/L (ref 101–111)
Glucose, Bld: 125 mg/dL — ABNORMAL HIGH (ref 65–99)
POTASSIUM: 3.6 mmol/L (ref 3.5–5.1)
SODIUM: 141 mmol/L (ref 135–145)

## 2017-01-12 LAB — CBC
HCT: 41.5 % (ref 36.0–46.0)
Hemoglobin: 13.7 g/dL (ref 12.0–15.0)
MCH: 27.3 pg (ref 26.0–34.0)
MCHC: 33 g/dL (ref 30.0–36.0)
MCV: 82.8 fL (ref 78.0–100.0)
PLATELETS: 304 10*3/uL (ref 150–400)
RBC: 5.01 MIL/uL (ref 3.87–5.11)
RDW: 15.2 % (ref 11.5–15.5)
WBC: 10.8 10*3/uL — AB (ref 4.0–10.5)

## 2017-01-13 LAB — HEMOGLOBIN A1C
Hgb A1c MFr Bld: 7.9 % — ABNORMAL HIGH (ref 4.8–5.6)
Mean Plasma Glucose: 180 mg/dL

## 2017-01-17 ENCOUNTER — Encounter (HOSPITAL_COMMUNITY)
Admission: RE | Disposition: A | Discharge status: Home / Self Care | Payer: Self-pay | Source: Ambulatory Visit | Attending: Orthopedic Surgery

## 2017-01-17 ENCOUNTER — Ambulatory Visit (HOSPITAL_COMMUNITY): Payer: PPO | Admitting: Certified Registered Nurse Anesthetist

## 2017-01-17 ENCOUNTER — Encounter (HOSPITAL_COMMUNITY): Payer: Self-pay

## 2017-01-17 ENCOUNTER — Observation Stay (HOSPITAL_COMMUNITY)
Admission: RE | Admit: 2017-01-17 | Discharge: 2017-01-18 | Disposition: A | Discharge status: Home / Self Care | Payer: PPO | Source: Ambulatory Visit | Attending: Orthopedic Surgery | Admitting: Orthopedic Surgery

## 2017-01-17 DIAGNOSIS — Z87891 Personal history of nicotine dependence: Secondary | ICD-10-CM | POA: Diagnosis not present

## 2017-01-17 DIAGNOSIS — M25762 Osteophyte, left knee: Secondary | ICD-10-CM | POA: Diagnosis not present

## 2017-01-17 DIAGNOSIS — I10 Essential (primary) hypertension: Secondary | ICD-10-CM | POA: Diagnosis not present

## 2017-01-17 DIAGNOSIS — Z96651 Presence of right artificial knee joint: Secondary | ICD-10-CM | POA: Insufficient documentation

## 2017-01-17 DIAGNOSIS — E119 Type 2 diabetes mellitus without complications: Secondary | ICD-10-CM | POA: Insufficient documentation

## 2017-01-17 DIAGNOSIS — J449 Chronic obstructive pulmonary disease, unspecified: Secondary | ICD-10-CM | POA: Diagnosis not present

## 2017-01-17 DIAGNOSIS — M1712 Unilateral primary osteoarthritis, left knee: Principal | ICD-10-CM | POA: Insufficient documentation

## 2017-01-17 DIAGNOSIS — G8918 Other acute postprocedural pain: Secondary | ICD-10-CM | POA: Diagnosis not present

## 2017-01-17 DIAGNOSIS — Z96652 Presence of left artificial knee joint: Secondary | ICD-10-CM

## 2017-01-17 DIAGNOSIS — K219 Gastro-esophageal reflux disease without esophagitis: Secondary | ICD-10-CM | POA: Diagnosis not present

## 2017-01-17 HISTORY — PX: PARTIAL KNEE ARTHROPLASTY: SHX2174

## 2017-01-17 HISTORY — DX: Fibromyalgia: M79.7

## 2017-01-17 LAB — TYPE AND SCREEN
ABO/RH(D): O POS
ANTIBODY SCREEN: NEGATIVE

## 2017-01-17 LAB — GLUCOSE, CAPILLARY
GLUCOSE-CAPILLARY: 206 mg/dL — AB (ref 65–99)
GLUCOSE-CAPILLARY: 204 mg/dL — AB (ref 65–99)
GLUCOSE-CAPILLARY: 243 mg/dL — AB (ref 65–99)
Glucose-Capillary: 135 mg/dL — ABNORMAL HIGH (ref 65–99)

## 2017-01-17 SURGERY — ARTHROPLASTY, KNEE, UNICOMPARTMENTAL
Anesthesia: Spinal | Site: Knee | Laterality: Left

## 2017-01-17 MED ORDER — PREDNISOLONE 5 MG PO TABS
5.0000 mg | ORAL_TABLET | Freq: Every evening | ORAL | Status: DC
  Administered 2017-01-17: 5 mg via ORAL
  Filled 2017-01-17: qty 1

## 2017-01-17 MED ORDER — SODIUM CHLORIDE 0.9 % IR SOLN
Status: DC | PRN
  Administered 2017-01-17: 1000 mL

## 2017-01-17 MED ORDER — PROPOFOL 500 MG/50ML IV EMUL
INTRAVENOUS | Status: DC | PRN
  Administered 2017-01-17: 75 ug/kg/min via INTRAVENOUS

## 2017-01-17 MED ORDER — CEFAZOLIN SODIUM-DEXTROSE 2-4 GM/100ML-% IV SOLN
2.0000 g | Freq: Four times a day (QID) | INTRAVENOUS | Status: AC
  Administered 2017-01-17 (×2): 2 g via INTRAVENOUS
  Filled 2017-01-17 (×2): qty 100

## 2017-01-17 MED ORDER — HYDRALAZINE HCL 20 MG/ML IJ SOLN
10.0000 mg | Freq: Four times a day (QID) | INTRAMUSCULAR | Status: DC | PRN
  Filled 2017-01-17: qty 0.5

## 2017-01-17 MED ORDER — DIPHENHYDRAMINE HCL 25 MG PO CAPS: 25.0000 mg | ORAL_CAPSULE | Freq: Four times a day (QID) | ORAL | Status: DC | PRN

## 2017-01-17 MED ORDER — HYDROMORPHONE HCL 2 MG PO TABS: 2.0000 mg | ORAL_TABLET | Freq: Four times a day (QID) | ORAL | 0 refills | Status: DC | PRN

## 2017-01-17 MED ORDER — MAGNESIUM CITRATE PO SOLN: 1.0000 | Freq: Once | ORAL | Status: DC | PRN

## 2017-01-17 MED ORDER — ROPIVACAINE HCL 5 MG/ML IJ SOLN
INTRAMUSCULAR | Status: DC | PRN
  Administered 2017-01-17: 30 mL via PERINEURAL

## 2017-01-17 MED ORDER — LACTATED RINGERS IV SOLN
INTRAVENOUS | Status: DC
  Administered 2017-01-17: 09:00:00 via INTRAVENOUS
  Administered 2017-01-17: 1000 mL via INTRAVENOUS

## 2017-01-17 MED ORDER — BUPIVACAINE-EPINEPHRINE 0.25% -1:200000 IJ SOLN
INTRAMUSCULAR | Status: DC | PRN
  Administered 2017-01-17: 30 mL

## 2017-01-17 MED ORDER — FENTANYL CITRATE (PF) 100 MCG/2ML IJ SOLN
25.0000 ug | INTRAMUSCULAR | Status: DC | PRN
  Filled 2017-01-17: qty 2

## 2017-01-17 MED ORDER — METOCLOPRAMIDE HCL 5 MG PO TABS: 5.0000 mg | ORAL_TABLET | Freq: Three times a day (TID) | ORAL | Status: DC | PRN

## 2017-01-17 MED ORDER — TRANEXAMIC ACID 1000 MG/10ML IV SOLN
1000.0000 mg | Freq: Once | INTRAVENOUS | Status: AC
  Administered 2017-01-17: 1000 mg via INTRAVENOUS
  Filled 2017-01-17: qty 1100

## 2017-01-17 MED ORDER — METHOCARBAMOL 500 MG PO TABS: 500.0000 mg | ORAL_TABLET | Freq: Four times a day (QID) | ORAL | 0 refills | Status: DC | PRN

## 2017-01-17 MED ORDER — MIDAZOLAM HCL 2 MG/2ML IJ SOLN
INTRAMUSCULAR | Status: AC
  Filled 2017-01-17: qty 2

## 2017-01-17 MED ORDER — BISACODYL 10 MG RE SUPP: 10.0000 mg | Freq: Every day | RECTAL | Status: DC | PRN

## 2017-01-17 MED ORDER — ONDANSETRON HCL 4 MG/2ML IJ SOLN
INTRAMUSCULAR | Status: DC | PRN
  Administered 2017-01-17: 4 mg via INTRAVENOUS

## 2017-01-17 MED ORDER — SODIUM CHLORIDE 0.9 % IJ SOLN
INTRAMUSCULAR | Status: DC | PRN
  Administered 2017-01-17: 30 mL

## 2017-01-17 MED ORDER — ASPIRIN 81 MG PO CHEW: 81.0000 mg | CHEWABLE_TABLET | Freq: Two times a day (BID) | ORAL | 0 refills | Status: AC

## 2017-01-17 MED ORDER — ASPIRIN EC 325 MG PO TBEC
325.0000 mg | DELAYED_RELEASE_TABLET | Freq: Two times a day (BID) | ORAL | Status: DC
  Administered 2017-01-18: 08:00:00 325 mg via ORAL
  Filled 2017-01-17: qty 1

## 2017-01-17 MED ORDER — PROMETHAZINE HCL 25 MG/ML IJ SOLN: 6.2500 mg | INTRAMUSCULAR | Status: DC | PRN

## 2017-01-17 MED ORDER — POLYETHYLENE GLYCOL 3350 17 G PO PACK
17.0000 g | PACK | Freq: Two times a day (BID) | ORAL | Status: DC
  Administered 2017-01-17: 22:00:00 17 g via ORAL
  Filled 2017-01-17 (×2): qty 1

## 2017-01-17 MED ORDER — DEXAMETHASONE SODIUM PHOSPHATE 10 MG/ML IJ SOLN
10.0000 mg | Freq: Once | INTRAMUSCULAR | Status: AC
  Administered 2017-01-18: 07:00:00 10 mg via INTRAVENOUS
  Filled 2017-01-17: qty 1

## 2017-01-17 MED ORDER — FERROUS SULFATE 325 (65 FE) MG PO TABS: 325.0000 mg | ORAL_TABLET | Freq: Three times a day (TID) | ORAL | Status: DC

## 2017-01-17 MED ORDER — MIDAZOLAM HCL 5 MG/5ML IJ SOLN
INTRAMUSCULAR | Status: DC | PRN
  Administered 2017-01-17: 2 mg via INTRAVENOUS

## 2017-01-17 MED ORDER — FENTANYL CITRATE (PF) 100 MCG/2ML IJ SOLN
INTRAMUSCULAR | Status: AC
  Filled 2017-01-17: qty 2

## 2017-01-17 MED ORDER — ACETAMINOPHEN 10 MG/ML IV SOLN
INTRAVENOUS | Status: AC
  Filled 2017-01-17: qty 100

## 2017-01-17 MED ORDER — CEFAZOLIN SODIUM-DEXTROSE 2-4 GM/100ML-% IV SOLN
2.0000 g | INTRAVENOUS | Status: AC
  Administered 2017-01-17: 2 g via INTRAVENOUS

## 2017-01-17 MED ORDER — ONDANSETRON HCL 4 MG/2ML IJ SOLN: 4.0000 mg | Freq: Four times a day (QID) | INTRAMUSCULAR | Status: DC | PRN

## 2017-01-17 MED ORDER — HYDROMORPHONE HCL 2 MG PO TABS
2.0000 mg | ORAL_TABLET | ORAL | Status: DC
  Administered 2017-01-17: 1 mg via ORAL
  Filled 2017-01-17: qty 1

## 2017-01-17 MED ORDER — DOCUSATE SODIUM 100 MG PO CAPS
100.0000 mg | ORAL_CAPSULE | Freq: Two times a day (BID) | ORAL | Status: DC
  Administered 2017-01-17 – 2017-01-18 (×2): 100 mg via ORAL
  Filled 2017-01-17 (×3): qty 1

## 2017-01-17 MED ORDER — DEXAMETHASONE SODIUM PHOSPHATE 10 MG/ML IJ SOLN
10.0000 mg | Freq: Once | INTRAMUSCULAR | Status: AC
  Administered 2017-01-17: 10 mg via INTRAVENOUS

## 2017-01-17 MED ORDER — BUPIVACAINE-EPINEPHRINE (PF) 0.25% -1:200000 IJ SOLN
INTRAMUSCULAR | Status: AC
  Filled 2017-01-17: qty 30

## 2017-01-17 MED ORDER — POLYETHYLENE GLYCOL 3350 17 G PO PACK: 17.0000 g | PACK | Freq: Two times a day (BID) | ORAL | 0 refills | Status: DC

## 2017-01-17 MED ORDER — PHENOL 1.4 % MT LIQD: 1.0000 | OROMUCOSAL | Status: DC | PRN

## 2017-01-17 MED ORDER — SODIUM CHLORIDE 0.9 % IV SOLN
INTRAVENOUS | Status: DC
  Administered 2017-01-17 – 2017-01-18 (×2): via INTRAVENOUS

## 2017-01-17 MED ORDER — PREDNISONE 1 MG PO TABS
4.0000 mg | ORAL_TABLET | Freq: Every evening | ORAL | Status: DC
  Administered 2017-01-17: 4 mg via ORAL
  Filled 2017-01-17: qty 4

## 2017-01-17 MED ORDER — METOCLOPRAMIDE HCL 5 MG/ML IJ SOLN: 5.0000 mg | Freq: Three times a day (TID) | INTRAMUSCULAR | Status: DC | PRN

## 2017-01-17 MED ORDER — HYDROMORPHONE HCL 1 MG/ML IJ SOLN
INTRAMUSCULAR | Status: AC
  Filled 2017-01-17: qty 1

## 2017-01-17 MED ORDER — METHOCARBAMOL 1000 MG/10ML IJ SOLN
500.0000 mg | Freq: Four times a day (QID) | INTRAVENOUS | Status: DC | PRN
  Filled 2017-01-17: qty 5

## 2017-01-17 MED ORDER — HYDRALAZINE HCL 20 MG/ML IJ SOLN
10.0000 mg | INTRAMUSCULAR | Status: DC
  Filled 2017-01-17: qty 0.5

## 2017-01-17 MED ORDER — AMLODIPINE BESYLATE 5 MG PO TABS
5.0000 mg | ORAL_TABLET | Freq: Every evening | ORAL | Status: DC
Start: 2017-01-17 — End: 2017-01-18
  Administered 2017-01-17: 18:00:00 5 mg via ORAL
  Filled 2017-01-17: qty 1

## 2017-01-17 MED ORDER — MIDAZOLAM HCL 2 MG/2ML IJ SOLN: 0.5000 mg | Freq: Once | INTRAMUSCULAR | Status: DC | PRN

## 2017-01-17 MED ORDER — ONDANSETRON HCL 4 MG PO TABS: 4.0000 mg | ORAL_TABLET | Freq: Four times a day (QID) | ORAL | Status: DC | PRN

## 2017-01-17 MED ORDER — FENTANYL CITRATE (PF) 100 MCG/2ML IJ SOLN
INTRAMUSCULAR | Status: DC | PRN
  Administered 2017-01-17: 100 ug via INTRAVENOUS

## 2017-01-17 MED ORDER — KETOROLAC TROMETHAMINE 30 MG/ML IJ SOLN
INTRAMUSCULAR | Status: DC | PRN
  Administered 2017-01-17: 30 mg

## 2017-01-17 MED ORDER — TRANEXAMIC ACID 1000 MG/10ML IV SOLN
1000.0000 mg | INTRAVENOUS | Status: AC
  Administered 2017-01-17: 1000 mg via INTRAVENOUS
  Filled 2017-01-17: qty 1100

## 2017-01-17 MED ORDER — DOCUSATE SODIUM 100 MG PO CAPS: 100.0000 mg | ORAL_CAPSULE | Freq: Two times a day (BID) | ORAL | 0 refills | Status: DC

## 2017-01-17 MED ORDER — CHLORHEXIDINE GLUCONATE 4 % EX LIQD: 60.0000 mL | Freq: Once | CUTANEOUS | Status: DC

## 2017-01-17 MED ORDER — HYDROMORPHONE HCL 2 MG PO TABS
1.0000 mg | ORAL_TABLET | ORAL | Status: DC
  Administered 2017-01-17 – 2017-01-18 (×6): 1 mg via ORAL
  Filled 2017-01-17 (×7): qty 1

## 2017-01-17 MED ORDER — FERROUS SULFATE 325 (65 FE) MG PO TABS
325.0000 mg | ORAL_TABLET | Freq: Three times a day (TID) | ORAL | Status: DC
  Administered 2017-01-17 – 2017-01-18 (×2): 325 mg via ORAL
  Filled 2017-01-17 (×2): qty 1

## 2017-01-17 MED ORDER — LATANOPROST 0.005 % OP SOLN
1.0000 [drp] | Freq: Every day | OPHTHALMIC | Status: DC
  Filled 2017-01-17: qty 2.5

## 2017-01-17 MED ORDER — HYDROMORPHONE HCL 1 MG/ML IJ SOLN
0.2500 mg | INTRAMUSCULAR | Status: DC | PRN
  Administered 2017-01-17 (×2): 0.5 mg via INTRAVENOUS

## 2017-01-17 MED ORDER — PROPOFOL 10 MG/ML IV BOLUS
INTRAVENOUS | Status: AC
  Filled 2017-01-17: qty 40

## 2017-01-17 MED ORDER — METHOCARBAMOL 500 MG PO TABS: 500.0000 mg | ORAL_TABLET | Freq: Four times a day (QID) | ORAL | Status: DC | PRN

## 2017-01-17 MED ORDER — CEFAZOLIN SODIUM-DEXTROSE 2-4 GM/100ML-% IV SOLN
INTRAVENOUS | Status: AC
  Filled 2017-01-17: qty 100

## 2017-01-17 MED ORDER — ALUM & MAG HYDROXIDE-SIMETH 200-200-20 MG/5ML PO SUSP: 30.0000 mL | ORAL | Status: DC | PRN

## 2017-01-17 MED ORDER — MENTHOL 3 MG MT LOZG: 1.0000 | LOZENGE | OROMUCOSAL | Status: DC | PRN

## 2017-01-17 MED ORDER — KETOROLAC TROMETHAMINE 30 MG/ML IJ SOLN
INTRAMUSCULAR | Status: AC
  Filled 2017-01-17: qty 1

## 2017-01-17 MED ORDER — SODIUM CHLORIDE 0.9 % IJ SOLN
INTRAMUSCULAR | Status: AC
  Filled 2017-01-17: qty 50

## 2017-01-17 SURGICAL SUPPLY — 41 items
ADH SKN CLS APL DERMABOND .7 (GAUZE/BANDAGES/DRESSINGS) ×1
BAG DECANTER FOR FLEXI CONT (MISCELLANEOUS) IMPLANT
BAG SPEC THK2 15X12 ZIP CLS (MISCELLANEOUS)
BAG ZIPLOCK 12X15 (MISCELLANEOUS) IMPLANT
BANDAGE ACE 6X5 VEL STRL LF (GAUZE/BANDAGES/DRESSINGS) ×3 IMPLANT
BLADE SAW RECIPROCATING 77.5 (BLADE) ×3 IMPLANT
BLADE SAW SGTL 11.0X1.19X90.0M (BLADE) ×2 IMPLANT
BLADE SAW SGTL 13.0X1.19X90.0M (BLADE) ×3 IMPLANT
BONE CEMENT GENTAMICIN (Cement) ×3 IMPLANT
BOWL SMART MIX CTS (DISPOSABLE) ×3 IMPLANT
CAPT KNEE PARTIAL 2 ×2 IMPLANT
CEMENT BONE GENTAMICIN 40 (Cement) IMPLANT
CLOTH BEACON ORANGE TIMEOUT ST (SAFETY) ×3 IMPLANT
COVER SURGICAL LIGHT HANDLE (MISCELLANEOUS) ×3 IMPLANT
CUFF TOURN SGL QUICK 34 (TOURNIQUET CUFF) ×3
CUFF TRNQT CYL 34X4X40X1 (TOURNIQUET CUFF) ×1 IMPLANT
DERMABOND ADVANCED (GAUZE/BANDAGES/DRESSINGS) ×2
DERMABOND ADVANCED .7 DNX12 (GAUZE/BANDAGES/DRESSINGS) ×1 IMPLANT
DRAPE U-SHAPE 47X51 STRL (DRAPES) ×3 IMPLANT
DRESSING AQUACEL AG SP 3.5X10 (GAUZE/BANDAGES/DRESSINGS) ×1 IMPLANT
DRSG AQUACEL AG SP 3.5X10 (GAUZE/BANDAGES/DRESSINGS) ×3
DURAPREP 26ML APPLICATOR (WOUND CARE) ×6 IMPLANT
ELECT REM PT RETURN 15FT ADLT (MISCELLANEOUS) ×3 IMPLANT
GLOVE BIOGEL PI IND STRL 7.5 (GLOVE) ×1 IMPLANT
GLOVE BIOGEL PI IND STRL 8.5 (GLOVE) IMPLANT
GLOVE BIOGEL PI INDICATOR 7.5 (GLOVE) ×14
GLOVE BIOGEL PI INDICATOR 8.5 (GLOVE) ×6
GOWN STRL REUS W/TWL LRG LVL3 (GOWN DISPOSABLE) ×3 IMPLANT
GOWN STRL REUS W/TWL XL LVL3 (GOWN DISPOSABLE) ×7 IMPLANT
LEGGING LITHOTOMY PAIR STRL (DRAPES) ×3 IMPLANT
MANIFOLD NEPTUNE II (INSTRUMENTS) ×3 IMPLANT
PACK TOTAL KNEE CUSTOM (KITS) ×3 IMPLANT
SUT MNCRL AB 4-0 PS2 18 (SUTURE) ×3 IMPLANT
SUT STRATAFIX 0 PDS 27 VIOLET (SUTURE) ×3
SUT VIC AB 1 CT1 36 (SUTURE) ×3 IMPLANT
SUT VIC AB 2-0 CT1 27 (SUTURE) ×6
SUT VIC AB 2-0 CT1 TAPERPNT 27 (SUTURE) ×2 IMPLANT
SUTURE STRATFX 0 PDS 27 VIOLET (SUTURE) ×1 IMPLANT
SYR 50ML LL SCALE MARK (SYRINGE) ×1 IMPLANT
TRAY FOLEY BAG SILVER LF 16FR (CATHETERS) ×2 IMPLANT
WRAP KNEE MAXI GEL POST OP (GAUZE/BANDAGES/DRESSINGS) ×2 IMPLANT

## 2017-01-17 NOTE — Transfer of Care (Signed)
Immediate Anesthesia Transfer of Care Note  Patient: Misty Morris  Procedure(s) Performed: Procedure(s): UNICOMPARTMENTAL LEFT KNEE (Left)  Patient Location: PACU  Anesthesia Type:Spinal  Level of Consciousness: sedated  Airway & Oxygen Therapy: Patient Spontanous Breathing and Patient connected to face mask oxygen  Post-op Assessment: Report given to RN and Post -op Vital signs reviewed and stable  Post vital signs: Reviewed and stable  Last Vitals:  Vitals:   01/17/17 0702 01/17/17 0704  BP:    Pulse: 94 95  Resp: 11 17  Temp:      Last Pain:  Vitals:   01/17/17 0546  TempSrc:   PainSc: 3          Complications: No apparent anesthesia complications

## 2017-01-17 NOTE — Evaluation (Signed)
Physical Therapy Evaluation Patient Details Name: Misty Morris MRN: [id number] DOB: [birth year] Today's Date: 01/17/2017   History of Present Illness  L  Uni knee  Clinical Impression  The patient is mobilizing very well. No pain this visit. Plans DC to home with HHPT. Pt admitted with above diagnosis. Pt currently with functional limitations due to the deficits listed below (see PT Problem List).  Pt will benefit from skilled PT to increase their independence and safety with mobility to allow discharge to the venue listed below.         Follow Up Recommendations Home health PT;Supervision - Intermittent    Equipment Recommendations  None recommended by PT    Recommendations for Other Services       Precautions / Restrictions Precautions Precautions: Fall;Knee      Mobility  Bed Mobility Overal bed mobility: Needs Assistance Bed Mobility: Supine to Sit     Supine to sit: Supervision        Transfers Overall transfer level: Needs assistance Equipment used: Rolling walker (2 wheeled) Transfers: Sit to/from Stand Sit to Stand: Min guard         General transfer comment: cues for hand and lt leg position  Ambulation/Gait Ambulation/Gait assistance: Min assist Ambulation Distance (Feet): 150 Feet Assistive device: Rolling walker (2 wheeled) Gait Pattern/deviations: Step-to pattern;Step-through pattern     General Gait Details: cues for safety and sequence  Stairs            Wheelchair Mobility    Modified Rankin (Stroke Patients Only)       Balance                                             Pertinent Vitals/Pain Pain Assessment: 0-10 Pain Score: 2  Pain Location: lt knee Pain Descriptors / Indicators: Discomfort Pain Intervention(s): Premedicated before session;Repositioned;Ice applied    Home Living Family/patient expects to be discharged to:: Private residence Living Arrangements: Alone Available Help at  Discharge: Friend(s);Available PRN/intermittently Type of Home: House Home Access: Stairs to enter Entrance Stairs-Rails: None Entrance Stairs-Number of Steps: 2 Home Layout: One level Home Equipment: Walker - 2 wheels;Cane - single point;Crutches;Bedside commode      Prior Function Level of Independence: Independent               Hand Dominance        Extremity/Trunk Assessment   Upper Extremity Assessment Upper Extremity Assessment: Overall WFL for tasks assessed    Lower Extremity Assessment Lower Extremity Assessment: LLE deficits/detail LLE Deficits / Details: SLR, knee flexion to 50    Cervical / Trunk Assessment Cervical / Trunk Assessment: Normal  Communication   Communication: No difficulties  Cognition Arousal/Alertness: Awake/alert Behavior During Therapy: Impulsive;WFL for tasks assessed/performed Overall Cognitive Status: Within Functional Limits for tasks assessed                                        General Comments      Exercises Total Joint Exercises Ankle Circles/Pumps: AROM;Left;5 reps Quad Sets: AROM;Left;5 reps Straight Leg Raises: AROM;Left;5 reps   Assessment/Plan    PT Assessment Patient needs continued PT services  PT Problem List Decreased strength;Decreased range of motion;Decreased activity tolerance;Decreased mobility;Decreased knowledge of precautions;Decreased safety awareness;Decreased knowledge of use of DME;Pain  PT Treatment Interventions DME instruction;Gait training;Stair training;Functional mobility training;Therapeutic activities;Therapeutic exercise;Patient/family education    PT Goals (Current goals can be found in the Care Plan section)  Acute Rehab PT Goals Patient Stated Goal: to go home PT Goal Formulation: With patient Time For Goal Achievement: 01/19/17 Potential to Achieve Goals: Good    Frequency 7X/week   Barriers to discharge        Co-evaluation                AM-PAC PT "6 Clicks" Daily Activity  Outcome Measure Difficulty turning over in bed (including adjusting bedclothes, sheets and blankets)?: None Difficulty moving from lying on back to sitting on the side of the bed? : None Difficulty sitting down on and standing up from a chair with arms (e.g., wheelchair, bedside commode, etc,.)?: A Little Help needed moving to and from a bed to chair (including a wheelchair)?: A Little Help needed walking in hospital room?: A Little Help needed climbing 3-5 steps with a railing? : A Little 6 Click Score: 20    End of Session   Activity Tolerance: Patient tolerated treatment well Patient left: in chair;with call bell/phone within reach Nurse Communication: Mobility status PT Visit Diagnosis: Other abnormalities of gait and mobility (R26.89) Pain - Right/Left: Left Pain - part of body: Knee    Time: 6203-5597 PT Time Calculation (min) (ACUTE ONLY): 17 min   Charges:   PT Evaluation $PT Eval Low Complexity: 1 Procedure     PT G Codes:   PT G-Codes **NOT FOR INPATIENT CLASS** Functional Assessment Tool Used: Clinical judgement;AM-PAC 6 Clicks Basic Mobility Functional Limitation: Mobility: Walking and moving around Mobility: Walking and Moving Around Current Status (C1638): At least 20 percent but less than 40 percent impaired, limited or restricted Mobility: Walking and Moving Around Goal Status [phone]): At least 1 percent but less than 20 percent impaired, limited or restricted    [facility] PT [phone]   Claretha Cooper 01/17/2017, 4:44 PM

## 2017-01-17 NOTE — Anesthesia Postprocedure Evaluation (Signed)
Anesthesia Post Note  Patient: Misty Morris  Procedure(s) Performed: Procedure(s) (LRB): UNICOMPARTMENTAL LEFT KNEE (Left)     Patient location during evaluation: PACU Anesthesia Type: Spinal Level of consciousness: awake and alert, oriented and patient cooperative Pain management: pain level controlled Vital Signs Assessment: post-procedure vital signs reviewed and stable Respiratory status: spontaneous breathing, nonlabored ventilation, respiratory function stable and patient connected to nasal cannula oxygen Cardiovascular status: blood pressure returned to baseline and stable Postop Assessment: spinal receding and no signs of nausea or vomiting Anesthetic complications: no    Last Vitals:  Vitals:   01/17/17 1010 01/17/17 1013  BP: (!) 178/87   Pulse: 87 85  Resp: 16 14  Temp:      Last Pain:  Vitals:   01/17/17 0950  TempSrc:   PainSc: 2                  [name],E. Jona Zappone

## 2017-01-17 NOTE — Op Note (Addendum)
NAME: Misty Morris    MEDICAL RECORD NO.: [id number]   FACILITY: [facility] BIRTH: 03/10/1951  PHYSICIAN: Pietro Cassis. Alvan Dame, M.D.    DATE OF PROCEDURE: 01/17/2017    OPERATIVE REPORT   PREOPERATIVE DIAGNOSIS: Left knee medial compartment osteoarthritis.   POSTOPERATIVE DIAGNOSIS: Left knee medial compartment osteoarthritis.  PROCEDURE: Left partial knee replacement utilizing Biomet Oxford knee  component, size X-small femur, a left medial size AA tibial tray with a size 3 insert.   SURGEON: Pietro Cassis. Alvan Dame, M.D.   ASSISTANT: Danae Orleans, PAC.  Please note that Mr. Misty Morris was present for the entirety of the case,  utilized for preoperative positioning, perioperative retractor  management, general facilitation of the case and primary wound closure.   ANESTHESIA: Spinal.   SPECIMENS: None.   COMPLICATIONS: None.  DRAINS: None   TOURNIQUET TIME: 34 minutes at 250 mmHg.   INDICATIONS FOR PROCEDURE: The patient is a 66 y.o. patient of mine who presented for evaluation of left knee pain.  They presented with primary complaints of pain on the medial side of their knee. Radiographs revealed advanced medial compartment arthritis with specifically an antero-medial wear pattern.  There was bone on bone changes noted with subchondral sclerosis and osteophytes present. The patient has had progressive problems failing to respond to conservative measures of medications, injections and activity modification. Risks of infection, DVT, component failure, need for future revision surgery were all discussed and reviewed.  Consent was obtained for benefit of pain relief.   PROCEDURE IN DETAIL: The patient was brought to the operative theater.  Once adequate anesthesia, preoperative antibiotics, 2 gm Ancef, 1 gm of Tranexamic Acid, and 10 mg of Decadron administered, the patient was positioned in supine position with a left thigh tourniquet  placed. The left lower extremity was prepped and  draped in sterile  fashion with the leg on the Oxford leg holder.  The leg was allowed to flex to 120 degrees. A time-out  was performed identifying the patient, planned procedure, and extremity.  The leg was exsanguinated, tourniquet elevated to 250 mmHg. A midline  incision was made from the proximal pole of the patella to the tibial tubercle. A  soft tissue plane was created and partial median arthrotomy was then  made to allow for subluxation of the patella. Following initial synovectomy and  debridement, the osteophytes were removed off the medial aspect of the  knee.   Attention was first directed to the tibia. The tibial  extramedullary guide was positioned over the anterior crest of the tibia  and pinned into position, and using a measured resection guide from the  [place] system, a 4 mm resection was made off the proximal tibia. First  the reciprocating saw along the medial aspect of the tibial spines, then the oscillating saw.    At this point, I sized this cut surface seem to be best fit for a size AA tibial tray.  With the retractors out of the wound and the knee held at 90 degrees the 3 feeler gauge had appropriate tension on the medial ligament.   At this point, the femoral canal was opened with a drill and the  intramedullary rod passed. Then using the X-small guide for the posterior resection off  the posterior aspect of the femur was positioned over the mid portion of the medial femoral condyle.  The orientation was set using the guide that mates the femoral guide to the intramedullary rod.  The 2 drill holes were  made into the distal femur.  The posterior guide was then impacted into place and the posterior  femoral cut made.  At this point, I milled the distal femur with a size 4 spigot in place. At this point, we did a trial reduction of the X-small femur, size AA tibial tray and size 3 feeler gauge insert. At 90 degrees of  flexion and at 20 degrees of flexion the knee  had symmetric tension on  the ligaments.   Given these findings, the trial femoral component was removed. Final preparation of tibia was carried out by pinning it in position. Then  using a reciprocating saw I removed bone for the keel. Further bone was  removed with an osteotome.  Trial reduction was now carried out with the X-small femur, the left medial keeled AA tibia, and a size 3 lollipop insert. The balance of the  ligaments appeared to be symmetric at 20 degrees and 90 degrees. Given  all these findings, the trial components were removed.   Cement was mixed. The final components were opened. The knee was irrigated with  normal saline solution. Then final debridements of the  soft tissue was carried out, I also drilled the sclerotic bone with a drill.  The final components were cemented with a single batch of cement in a  two-stage technique with the tibial component cemented first. The knee  was then brought  to 45 degrees of flexion with a size 3 feeler gauge, held with pressure for a minute and half.  After this the femoral component was cemented in place.  The knee was again held at 45 degrees of flexion while the cement fully cured.  Excess cement was removed throughout the knee. Tourniquet was let down  after 34 minutes. After the cement had fully cured and excessive cement  was removed throughout the knee there was no visualized cement present.   The final size 3 left medial tibial tray to match the X-small femur was chosen and snapped into position. We re-irrigated  the knee. The extensor mechanism  was then reapproximated using a #1 Vicryl and #0 Stratafix sutures with the knee in flexion. The  remaining wound was closed with 2-0 Vicryl and a running 4-0 Monocryl.  The knee was cleaned, dried, and dressed sterilely using Dermabond and  Aquacel dressing. The patient  was brought to the recovery room, Ace wrap in place, tolerating the  procedure well. She will be in the  hospital for overnight observation.  We will initiate physical therapy and progress to ambulate.     Pietro Cassis Alvan Dame, M.D.

## 2017-01-17 NOTE — Anesthesia Procedure Notes (Signed)
Spinal  Start time: 01/17/2017 7:33 AM End time: 01/17/2017 7:38 AM Staffing Resident/CRNA: Gean Maidens Performed: resident/CRNA  Preanesthetic Checklist Completed: patient identified, site marked, surgical consent, pre-op evaluation, timeout performed, IV checked, risks and benefits discussed and monitors and equipment checked Spinal Block Patient position: sitting Prep: Betadine Patient monitoring: heart rate, continuous pulse ox and blood pressure Approach: midline Location: L4-5 Injection technique: single-shot Needle Needle type: Quincke  Needle gauge: 22 G Needle length: 9 cm Needle insertion depth: 6 cm Additional Notes Pt sitting position, sterile prep and drape, negative paresthesia, negative heme.

## 2017-01-17 NOTE — Anesthesia Procedure Notes (Signed)
Anesthesia Regional Block: Adductor canal block   Pre-Anesthetic Checklist: ,, timeout performed, Correct Patient, Correct Site, Correct Laterality, Correct Procedure, Correct Position, site marked, Risks and benefits discussed,  Surgical consent,  Pre-op evaluation,  At surgeon's request and post-op pain management  Laterality: Left and Lower  Prep: chloraprep       Needles:   Needle Type: Echogenic Needle     Needle Length: 9cm  Needle Gauge: 21     Additional Needles:   Procedures: ultrasound guided,,,,,,,,  Narrative:  Start time: 01/17/2017 6:51 AM End time: 01/17/2017 7:00 AM Injection made incrementally with aspirations every 5 mL.  Performed by: Personally  Anesthesiologist: Glennon Mac, Janeese Mcgloin  Additional Notes: Pt identified in Holding room.  Monitors applied. Working IV access confirmed. Sterile prep L thigh.  #21 ga ECHOgenic needle into adductor canal with US guidance.  30cc 0.5% Ropivacaine injected incrementally after negative test dose.  Patient asymptomatic, VSS, no heme aspirated, tolerated well.  Jenita Seashore, MD

## 2017-01-17 NOTE — Interval H&P Note (Signed)
History and Physical Interval Note:  01/17/2017 7:16 AM  Misty Morris  has presented today for surgery, with the diagnosis of left knee medial compartment OA  The various methods of treatment have been discussed with the patient and family. After consideration of risks, benefits and other options for treatment, the patient has consented to  Procedure(s): UNICOMPARTMENTAL LEFT KNEE (Left) as a surgical intervention .  The patient's history has been reviewed, patient examined, no change in status, stable for surgery.  I have reviewed the patient's chart and labs.  Questions were answered to the patient's satisfaction.     Mauri Pole

## 2017-01-17 NOTE — Progress Notes (Signed)
Hydralazine 5-10 mg ordered per Dr Alvan Dame for elevated BP on arrival to room after discharge from PACU

## 2017-01-17 NOTE — Progress Notes (Signed)
Assisted Dr. Carswell Jackson with left, ultrasound guided, adductor canal block. Side rails up, monitors on throughout procedure. See vital signs in flow sheet. Tolerated Procedure well.  

## 2017-01-17 NOTE — Progress Notes (Signed)
Dr Glennon Mac aware of BP.  States ok to take to room

## 2017-01-17 NOTE — Discharge Instructions (Signed)

## 2017-01-17 NOTE — Anesthesia Preprocedure Evaluation (Signed)
Anesthesia Evaluation  Patient identified by MRN, date of birth, ID band Patient awake    Reviewed: Allergy & Precautions, NPO status , Patient's Chart, lab work & pertinent test results  History of Anesthesia Complications Negative for: history of anesthetic complications  Airway Mallampati: II  TM Distance: >3 FB Neck ROM: Full    Dental  (+) Missing, Dental Advisory Given, Chipped   Pulmonary COPD, former smoker (quit 2013),    breath sounds clear to auscultation       Cardiovascular hypertension, Pt. on medications (-) angina Rhythm:Regular Rate:Normal     Neuro/Psych negative neurological ROS     GI/Hepatic Neg liver ROS, GERD  Controlled,  Endo/Other  diabetes (diet control, glu 206)  Renal/GU negative Renal ROS     Musculoskeletal  (+) Arthritis  (polyarthritis rheumatica: steroids),   Abdominal   Peds  Hematology negative hematology ROS (+)   Anesthesia Other Findings   Reproductive/Obstetrics                             Anesthesia Physical Anesthesia Plan  ASA: III  Anesthesia Plan: Spinal   Post-op Pain Management:  Regional for Post-op pain   Induction:   PONV Risk Score and Plan: 3 and Ondansetron, Dexamethasone, Propofol and Midazolam  Airway Management Planned: Natural Airway and Simple Face Mask  Additional Equipment:   Intra-op Plan:   Post-operative Plan:   Informed Consent: I have reviewed the patients History and Physical, chart, labs and discussed the procedure including the risks, benefits and alternatives for the proposed anesthesia with the patient or authorized representative who has indicated his/her understanding and acceptance.   Dental advisory given  Plan Discussed with: CRNA and Surgeon  Anesthesia Plan Comments: (Plan routine monitors, SAB with adductor canal block for post op analgesia)        Anesthesia Quick Evaluation

## 2017-01-18 DIAGNOSIS — M1712 Unilateral primary osteoarthritis, left knee: Secondary | ICD-10-CM | POA: Diagnosis not present

## 2017-01-18 LAB — CBC
HCT: 39.1 % (ref 36.0–46.0)
Hemoglobin: 12.7 g/dL (ref 12.0–15.0)
MCH: 27.4 pg (ref 26.0–34.0)
MCHC: 32.5 g/dL (ref 30.0–36.0)
MCV: 84.3 fL (ref 78.0–100.0)
PLATELETS: 290 10*3/uL (ref 150–400)
RBC: 4.64 MIL/uL (ref 3.87–5.11)
RDW: 15.4 % (ref 11.5–15.5)
WBC: 17.1 10*3/uL — AB (ref 4.0–10.5)

## 2017-01-18 LAB — BASIC METABOLIC PANEL
ANION GAP: 9 (ref 5–15)
BUN: 15 mg/dL (ref 6–20)
CALCIUM: 9.2 mg/dL (ref 8.9–10.3)
CO2: 25 mmol/L (ref 22–32)
Chloride: 102 mmol/L (ref 101–111)
Creatinine, Ser: 0.7 mg/dL (ref 0.44–1.00)
GFR calc non Af Amer: >60 mL/min (ref >60)
Glucose, Bld: 230 mg/dL — ABNORMAL HIGH (ref 65–99)
Potassium: 4 mmol/L (ref 3.5–5.1)
Sodium: 136 mmol/L (ref 135–145)

## 2017-01-18 NOTE — Progress Notes (Signed)
Discharge plan:  Pt states MD wants her to have HHPT. [facility] chosen when choice offered for home health services. [facility] rep aware of referral. Pt states she has RW, 3in1 and shower chair at home. Marney Doctor RN,BSN,NCM [phone]

## 2017-01-18 NOTE — Evaluation (Signed)
Occupational Therapy Evaluation Patient Details Name: Misty Morris MRN: [id number] DOB: 1951/04/08 Today's Date: 01/18/2017    History of Present Illness L tka   Clinical Impression   This 66 year old female was admitted for the above sx. She will be returning home alone with intermittent assistance from neighbors. All education reviewed and pt verbalizes understanding. No further OT is needed at this time    Follow Up Recommendations  Supervision - Intermittent    Equipment Recommendations  None recommended by OT    Recommendations for Other Services       Precautions / Restrictions Precautions Precautions: Fall;Knee Restrictions Weight Bearing Restrictions: No      Mobility Bed Mobility               General bed mobility comments: oob  Transfers Overall transfer level: Needs assistance Equipment used: Rolling walker (2 wheeled)   Sit to Stand: Supervision         General transfer comment: for safety    Balance                                           ADL either performed or assessed with clinical judgement   ADL Overall ADL's : Needs assistance/impaired     Grooming: Set up;Standing   Upper Body Bathing: Set up;Sitting   Lower Body Bathing: Sit to/from stand;Set up   Upper Body Dressing : Set up;Sitting   Lower Body Dressing: Minimal assistance;Sit to/from stand (for ted hose)   Toilet Transfer: Supervision/safety;Ambulation;Comfort height toilet;RW   Toileting- Clothing Manipulation and Hygiene: Sit to/from stand;Modified independent         General ADL Comments: reviewed general safety and keeping cell phone with her when she is alone. Pt wazs safe and deliberate when ambulating to bathroom. Reviewe knee precautions     Vision         Perception     Praxis      Pertinent Vitals/Pain Pain Assessment: 0-10 Pain Score: 2  Pain Location: L knee Pain Descriptors / Indicators: Sore Pain Intervention(s):  Limited activity within patient's tolerance;Monitored during session;Premedicated before session;Repositioned (ice just removed)     Hand Dominance     Extremity/Trunk Assessment Upper Extremity Assessment Upper Extremity Assessment: Overall WFL for tasks assessed           Communication Communication Communication: No difficulties   Cognition Arousal/Alertness: Awake/alert Behavior During Therapy: WFL for tasks assessed/performed Overall Cognitive Status: Within Functional Limits for tasks assessed                                     General Comments       Exercises     Shoulder Instructions      Home Living Family/patient expects to be discharged to:: Private residence Living Arrangements: Alone Available Help at Discharge: Friend(s);Available PRN/intermittently               Bathroom Shower/Tub: Teacher, early years/pre: Standard     Home Equipment: Bedside commode;Tub bench (reacher and long sponge)          Prior Functioning/Environment Level of Independence: Independent                 OT Problem List:        OT Treatment/Interventions:  OT Goals(Current goals can be found in the care plan section) Acute Rehab OT Goals Patient Stated Goal: to go home OT Goal Formulation: All assessment and education complete, DC therapy  OT Frequency:     Barriers to D/C:            Co-evaluation              AM-PAC PT "6 Clicks" Daily Activity     Outcome Measure Help from another person eating meals?: None Help from another person taking care of personal grooming?: A Little Help from another person toileting, which includes using toliet, bedpan, or urinal?: A Little Help from another person bathing (including washing, rinsing, drying)?: A Little Help from another person to put on and taking off regular upper body clothing?: A Little Help from another person to put on and taking off regular lower body  clothing?: A Little 6 Click Score: 19   End of Session    Activity Tolerance: Patient tolerated treatment well Patient left: in chair;with call bell/phone within reach  OT Visit Diagnosis: Pain Pain - Right/Left: Left Pain - part of body: Knee                Time: 8115-7262 OT Time Calculation (min): 14 min Charges:  OT General Charges $OT Visit: 1 Procedure OT Evaluation $OT Eval Low Complexity: 1 Procedure G-Codes: OT G-codes **NOT FOR INPATIENT CLASS** Functional Assessment Tool Used: Clinical judgement Functional Limitation: Self care Self Care Current Status (M3559): At least 1 percent but less than 20 percent impaired, limited or restricted Self Care Goal Status (R4163): At least 1 percent but less than 20 percent impaired, limited or restricted Self Care Discharge Status [phone]): At least 1 percent but less than 20 percent impaired, limited or restricted   Misty Morris, OTR/L [phone] 01/18/2017  Misty Morris 01/18/2017, 8:48 AM

## 2017-01-18 NOTE — Progress Notes (Signed)
Nursing Discharge Summary  Patient ID: BLOSSIE RAFFEL MRN: [id number] DOB/AGE: 66-Mar-1952 66 y.o.  Admit date: 01/17/2017 Discharge date: 01/18/2017  Discharged Condition: good  Disposition: 06-Home-Health Care Svc  Follow-up Information    Paralee Cancel, MD. Schedule an appointment as soon as possible for a visit in 2 week(s).   Specialty:  Orthopedic Surgery Contact information: [address] [place] [address] [phone]           Prescriptions Given: Prescriptions for Robaxin, Dilaudid, and aspirin given.  Patient follow up appointments and medications discussed.  Patient verbalized understanding without further questions.    Means of Discharge: patient will be taken downstairs via wheelchair to be discharge home via private vehicle.    Signed: Buel Ream 01/18/2017, 10:33 AM

## 2017-01-18 NOTE — Progress Notes (Signed)
Inpatient Diabetes Program Recommendations  AACE/ADA: New Consensus Statement on Inpatient Glycemic Control (2015)  Target Ranges:  Prepandial:   less than 140 mg/dL      Peak postprandial:   less than 180 mg/dL (1-2 hours)      Critically ill patients:  140 - 180 mg/dL   Results for Misty Morris, Misty Morris (MRN [id number]) as of 01/18/2017 11:00  Ref. Range 01/17/2017 05:38 01/17/2017 09:40 01/17/2017 12:56 01/17/2017 16:46  Glucose-Capillary Latest Ref Range: 65 - 99 mg/dL 206 (H) 135 (H) 204 (H) 243 (H)   Results for Misty Morris, Misty Morris (MRN [id number]) as of 01/18/2017 11:00  Ref. Range 01/18/2017 05:19  Glucose Latest Ref Range: 65 - 99 mg/dL 230 (H)   Review of Glycemic Control  Diabetes history: DM 2 Outpatient Diabetes medications: Diet controlled Current orders for Inpatient glycemic control: None  Inpatient Diabetes Program Recommendations:    Glucose in the 200's patient taking prednisone this admission. Please consider Novolog Sensitive Correction tid + HS scale while inpatient.  Thanks,  Tama Headings RN, MSN, [facility] Inpatient Diabetes Coordinator Team Pager [phone] (8a-5p)

## 2017-01-18 NOTE — Progress Notes (Signed)
Patient ID: Misty Morris, female   DOB: [birth year], 66 y.o.   MRN: [id number] Subjective: 1 Day Post-Op Procedure(s) (LRB): UNICOMPARTMENTAL LEFT KNEE (Left)    Patient reports pain as mild.  Recognizes improvement thus far as compared to total knee on right  Objective:   VITALS:   Vitals:   01/18/17 0525 01/18/17 0530  BP: (!) 176/88 (!) 170/90  Pulse: 65 67  Resp: 16   Temp: 97.8 F (36.6 C)     Neurovascular intact Incision: dressing C/D/I  LABS  Recent Labs  01/18/17 0519  HGB 12.7  HCT 39.1  WBC 17.1*  PLT 290     Recent Labs  01/18/17 0519  NA 136  K 4.0  BUN 15  CREATININE 0.70  GLUCOSE 230*    No results for input(s): LABPT, INR in the last 72 hours.   Assessment/Plan: 1 Day Post-Op Procedure(s) (LRB): UNICOMPARTMENTAL LEFT KNEE (Left)   Advance diet Up with therapy  Home today after therapy PT plans already established RTC in 2 weeks wound check Reviewed goals

## 2017-01-18 NOTE — Progress Notes (Signed)
Physical Therapy Treatment Patient Details Name: Misty Morris MRN: [id number] DOB: April 17, 1951 Today's Date: 01/18/2017    History of Present Illness L uka    PT Comments    Pt progressing well with mobility and has met PT goals, she's ready to DC home from PT standpoint. Stair training completed, pt demonstrates good understanding of HEP, she ambulated 200' with RW. L knee AAROM 5-85*, she is independent with SLR.    Follow Up Recommendations  Home health PT;Supervision - Intermittent     Equipment Recommendations       Recommendations for Other Services       Precautions / Restrictions Precautions Precautions: Fall;Knee Precaution Comments: reviewed no pillow under knee Restrictions Weight Bearing Restrictions: No    Mobility  Bed Mobility               General bed mobility comments: oob  Transfers Overall transfer level: Needs assistance Equipment used: Rolling walker (2 wheeled) Transfers: Sit to/from Stand Sit to Stand: Supervision         General transfer comment: VCs hand placement  Ambulation/Gait Ambulation/Gait assistance: Supervision Ambulation Distance (Feet): 200 Feet Assistive device: Rolling walker (2 wheeled) Gait Pattern/deviations: Step-to pattern;Decreased stride length   Gait velocity interpretation: Below normal speed for age/gender General Gait Details: steady, no LOB, good sequencing   Stairs Stairs: Yes   Stair Management: No rails;One rail Left;Forwards;Backwards;Step to pattern Number of Stairs: 4 General stair comments: pt has a pole on L side she can hold so practiced stairs backwards with RW no rails, then forwards with L rail and no AD, pt prefers backwards with RW. She lives alone so will not have assistance available for stairs.   Wheelchair Mobility    Modified Rankin (Stroke Patients Only)       Balance Overall balance assessment: Modified Independent                                           Cognition Arousal/Alertness: Awake/alert Behavior During Therapy: WFL for tasks assessed/performed Overall Cognitive Status: Within Functional Limits for tasks assessed                                        Exercises Total Joint Exercises Ankle Circles/Pumps: AROM;Left;10 reps;Supine Quad Sets: AROM;Left;5 reps;Supine Short Arc Quad: AROM;Left;10 reps;Supine Heel Slides: AAROM;Left;10 reps;Supine Straight Leg Raises: Left;10 reps;Supine Long Arc Quad: AROM;Left;Seated;10 reps Knee Flexion: AAROM;AROM;Left;10 reps;Seated Goniometric ROM: 5-85* AAROM L knee    General Comments        Pertinent Vitals/Pain Pain Assessment: 0-10 Pain Score: 9  Pain Location: L knee Pain Descriptors / Indicators: Sore Pain Intervention(s): Limited activity within patient's tolerance;Monitored during session;Premedicated before session;Ice applied    Home Living Family/patient expects to be discharged to:: Private residence Living Arrangements: Alone Available Help at Discharge: Friend(s);Available PRN/intermittently         Home Equipment: Bedside commode;Tub bench (reacher and long sponge)      Prior Function Level of Independence: Independent          PT Goals (current goals can now be found in the care plan section) Acute Rehab PT Goals Patient Stated Goal: to go home, work out at gym PT Goal Formulation: With patient Time For Goal Achievement: 01/19/17 Potential to Achieve Goals: Good Progress  towards PT goals: Goals met/education completed, patient discharged from PT    Frequency    7X/week      PT Plan Current plan remains appropriate    Co-evaluation              AM-PAC PT "6 Clicks" Daily Activity  Outcome Measure  Difficulty turning over in bed (including adjusting bedclothes, sheets and blankets)?: None Difficulty moving from lying on back to sitting on the side of the bed? : None Difficulty sitting down on and standing up from  a chair with arms (e.g., wheelchair, bedside commode, etc,.)?: None Help needed moving to and from a bed to chair (including a wheelchair)?: None Help needed walking in hospital room?: None Help needed climbing 3-5 steps with a railing? : None 6 Click Score: 24    End of Session Equipment Utilized During Treatment: Gait belt Activity Tolerance: Patient limited by pain;Patient tolerated treatment well Patient left: in chair;with call bell/phone within reach Nurse Communication: Mobility status PT Visit Diagnosis: Other abnormalities of gait and mobility (R26.89);Pain Pain - Right/Left: Left Pain - part of body: Knee     Time: 5686-1683 PT Time Calculation (min) (ACUTE ONLY): 33 min  Charges:  $Gait Training: 8-22 mins $Therapeutic Exercise: 8-22 mins                    G Codes:          Philomena Doheny 01/18/2017, 9:26 AM [phone]

## 2017-01-20 DIAGNOSIS — Z7982 Long term (current) use of aspirin: Secondary | ICD-10-CM | POA: Diagnosis not present

## 2017-01-20 DIAGNOSIS — Z7952 Long term (current) use of systemic steroids: Secondary | ICD-10-CM | POA: Diagnosis not present

## 2017-01-20 DIAGNOSIS — E119 Type 2 diabetes mellitus without complications: Secondary | ICD-10-CM | POA: Diagnosis not present

## 2017-01-20 DIAGNOSIS — Z471 Aftercare following joint replacement surgery: Secondary | ICD-10-CM | POA: Diagnosis not present

## 2017-01-20 DIAGNOSIS — Z96653 Presence of artificial knee joint, bilateral: Secondary | ICD-10-CM | POA: Diagnosis not present

## 2017-01-20 DIAGNOSIS — Z87891 Personal history of nicotine dependence: Secondary | ICD-10-CM | POA: Diagnosis not present

## 2017-01-20 DIAGNOSIS — I10 Essential (primary) hypertension: Secondary | ICD-10-CM | POA: Diagnosis not present

## 2017-01-20 DIAGNOSIS — E663 Overweight: Secondary | ICD-10-CM | POA: Diagnosis not present

## 2017-01-20 DIAGNOSIS — Z79891 Long term (current) use of opiate analgesic: Secondary | ICD-10-CM | POA: Diagnosis not present

## 2017-01-20 DIAGNOSIS — M069 Rheumatoid arthritis, unspecified: Secondary | ICD-10-CM | POA: Diagnosis not present

## 2017-01-20 DIAGNOSIS — Z6827 Body mass index (BMI) 27.0-27.9, adult: Secondary | ICD-10-CM | POA: Diagnosis not present

## 2017-01-23 NOTE — Discharge Summary (Signed)
Physician Discharge Summary  Patient ID: Misty Morris MRN: [id number] DOB/AGE: [birth year] 66 y.o.  Admit date: 01/17/2017 Discharge date: 01/18/2017   Procedures:  Procedure(s) (LRB): UNICOMPARTMENTAL LEFT KNEE (Left)  Attending Physician:  Dr. Paralee Cancel   Admission Diagnoses:   Left knee medial compartmental primary OA /pain  Discharge Diagnoses:  Principal Problem:   S/P left UKR  Past Medical History:  Diagnosis Date  . Diabetes mellitus without complication (HCC)    diet controlled  . Fibromyalgia   . GERD (gastroesophageal reflux disease)   . Hypertension   . Polyarthritis rheumatica (HCC)    RA  . Shingles     HPI:    Misty Morris, 66 y.o. female female, has a history of pain and functional disability in the left and has failed non-surgical conservative treatments for greater than 12 weeks to include NSAID's and/or analgesics, corticosteriod injections and activity modification.  Onset of symptoms was abrupt, starting January 2018 years ago with rapidlly worsening course since that time. The patient noted prior procedures on the knee to include  arthroplasty on the right knee(s).  Patient currently rates pain in the left knee(s) at 10 out of 10 with activity. Patient has worsening of pain with activity and weight bearing, pain that interferes with activities of daily living, pain with passive range of motion, crepitus and joint swelling.  Patient has evidence of periarticular osteophytes and joint space narrowing of the medial compartment by imaging studies.  There is no active infection.  Risks, benefits and expectations were discussed with the patient.  Risks including but not limited to the risk of anesthesia, blood clots, nerve damage, blood vessel damage, failure of the prosthesis, infection and up to and including death.  Patient understand the risks, benefits and expectations and wishes to proceed with surgery.   PCP: Alroy Dust, L.Marlou Sa, MD   Discharged  Condition: good  Hospital Course:  Patient underwent the above stated procedure on 01/17/2017. Patient tolerated the procedure well and brought to the recovery room in good condition and subsequently to the floor.  POD #1 BP: 170/90 ; Pulse: 67 ; Temp: 97.8 F (36.6 C) ; Resp: 16 Patient reports pain as mild.  Recognizes improvement thus far as compared to total knee on right. Neurovascular intact and incision: dressing C/D/I.  LABS  Basename    HGB     12.7  HCT     39.1    Discharge Exam: General appearance: alert, cooperative and no distress Extremities: Homans sign is negative, no sign of DVT, no edema, redness or tenderness in the calves or thighs and no ulcers, gangrene or trophic changes  Disposition: Home with follow up in 2 weeks   Follow-up Information    Paralee Cancel, MD. Schedule an appointment as soon as possible for a visit in 2 week(s).   Specialty:  Orthopedic Surgery Contact information: [address] [place] [address] [phone]        [facility] up.   Specialty:  [facility] Contact information: [place] [address] [phone]           Discharge Instructions    Call MD / Call 911    Complete by:  As directed    If you experience chest pain or shortness of breath, CALL 911 and be transported to the hospital emergency room.  If you develope a fever above 101 F, pus (white drainage) or increased drainage or redness  at the wound, or calf pain, call your surgeon's office.   Change dressing    Complete by:  As directed    Maintain surgical dressing until follow up in the clinic. If the edges start to pull up, may reinforce with tape. If the dressing is no longer working, may remove and cover with gauze and tape, but must keep the area dry and clean.  Call with any questions or concerns.   Constipation Prevention    Complete by:  As directed    Drink plenty of fluids.  Prune  juice may be helpful.  You may use a stool softener, such as Colace (over the counter) 100 mg twice a day.  Use MiraLax (over the counter) for constipation as needed.   Diet - low sodium heart healthy    Complete by:  As directed    Discharge instructions    Complete by:  As directed    Maintain surgical dressing until follow up in the clinic. If the edges start to pull up, may reinforce with tape. If the dressing is no longer working, may remove and cover with gauze and tape, but must keep the area dry and clean.  Follow up in 2 weeks at [facility]. Call with any questions or concerns.   Increase activity slowly as tolerated    Complete by:  As directed    Weight bearing as tolerated with assist device (walker, cane, etc) as directed, use it as long as suggested by your surgeon or therapist, typically at least 4-6 weeks.   TED hose    Complete by:  As directed    Use stockings (TED hose) for 2 weeks on both leg(s).  You may remove them at night for sleeping.      Allergies as of 01/18/2017      Reactions   Latex Other (See Comments)   Blistering   Codeine Hives   Demerol [meperidine] Hives   Morphine And Related Hives   Sulfa Antibiotics Hives   Tramadol Hives   Vicodin [hydrocodone-acetaminophen] Itching   Elevated blood pressure. Tolerates low doses.      Medication List    STOP taking these medications   traMADol 50 MG tablet Commonly known as:  ULTRAM     TAKE these medications   acetaminophen 650 MG CR tablet Commonly known as:  TYLENOL Take 650 mg by mouth daily as needed for pain. With tramadol   Acetylcarn-Alpha Lipoic Acid 400-200 MG Caps Take 1 capsule by mouth 2 (two) times daily.   amLODipine 5 MG tablet Commonly known as:  NORVASC Take 5 mg by mouth every evening.   aspirin 81 MG chewable tablet Chew 1 tablet (81 mg total) by mouth 2 (two) times daily. Take for 4 weeks.   Biotin 1000 MCG tablet Take 1,000 mcg by mouth every evening.     CALCIUM 1000 + D PO Take 1 tablet by mouth every evening.   Chromium Picolinate 200 MCG Caps Take 200 mcg by mouth daily.   CoQ-10 100 MG Caps Take 100 mg by mouth daily.   diphenhydrAMINE 25 MG tablet Commonly known as:  BENADRYL Take 25 mg by mouth at bedtime.   docusate sodium 100 MG capsule Commonly known as:  COLACE Take 1 capsule (100 mg total) by mouth 2 (two) times daily.   ferrous sulfate 325 (65 FE) MG tablet Commonly known as:  FERROUSUL Take 1 tablet (325 mg total) by mouth 3 (three) times daily with meals.  FISH OIL PO Take 1 capsule by mouth 2 (two) times daily.   HYDROmorphone 2 MG tablet Commonly known as:  DILAUDID Take 1-2 tablets (2-4 mg total) by mouth every 6 (six) hours as needed for severe pain (breakthrough pain).   Lysine 500 MG Caps Take 500 mg by mouth every evening.   methocarbamol 500 MG tablet Commonly known as:  ROBAXIN Take 1 tablet (500 mg total) by mouth every 6 (six) hours as needed for muscle spasms.   polyethylene glycol packet Commonly known as:  MIRALAX / GLYCOLAX Take 17 g by mouth 2 (two) times daily.   prednisoLONE 5 MG Tabs tablet Take 5 mg by mouth every evening. Take with 4 mg prednisone to add up to 9mg s daily   predniSONE 1 MG tablet Commonly known as:  DELTASONE Take 4 mg by mouth every evening. Take with 5mg s, add up to 9mg s daily   Vitamin D 2000 units Caps Take 4,000 Units by mouth daily.   ZIOPTAN 0.0015 % Soln Generic drug:  Tafluprost Place 1 drop into both eyes at bedtime.        Signed: West Pugh. Kohlton Gilpatrick   PA-C  01/23/2017, 12:02 PM

## 2017-01-24 DIAGNOSIS — E119 Type 2 diabetes mellitus without complications: Secondary | ICD-10-CM | POA: Diagnosis not present

## 2017-01-24 DIAGNOSIS — Z7982 Long term (current) use of aspirin: Secondary | ICD-10-CM | POA: Diagnosis not present

## 2017-01-24 DIAGNOSIS — Z96653 Presence of artificial knee joint, bilateral: Secondary | ICD-10-CM | POA: Diagnosis not present

## 2017-01-24 DIAGNOSIS — Z87891 Personal history of nicotine dependence: Secondary | ICD-10-CM | POA: Diagnosis not present

## 2017-01-24 DIAGNOSIS — Z79891 Long term (current) use of opiate analgesic: Secondary | ICD-10-CM | POA: Diagnosis not present

## 2017-01-24 DIAGNOSIS — Z471 Aftercare following joint replacement surgery: Secondary | ICD-10-CM | POA: Diagnosis not present

## 2017-01-24 DIAGNOSIS — M069 Rheumatoid arthritis, unspecified: Secondary | ICD-10-CM | POA: Diagnosis not present

## 2017-01-24 DIAGNOSIS — I10 Essential (primary) hypertension: Secondary | ICD-10-CM | POA: Diagnosis not present

## 2017-01-24 DIAGNOSIS — Z7952 Long term (current) use of systemic steroids: Secondary | ICD-10-CM | POA: Diagnosis not present

## 2017-01-24 DIAGNOSIS — E663 Overweight: Secondary | ICD-10-CM | POA: Diagnosis not present

## 2017-01-24 DIAGNOSIS — Z6827 Body mass index (BMI) 27.0-27.9, adult: Secondary | ICD-10-CM | POA: Diagnosis not present

## 2017-01-31 DIAGNOSIS — E119 Type 2 diabetes mellitus without complications: Secondary | ICD-10-CM | POA: Diagnosis not present

## 2017-01-31 DIAGNOSIS — Z6827 Body mass index (BMI) 27.0-27.9, adult: Secondary | ICD-10-CM | POA: Diagnosis not present

## 2017-01-31 DIAGNOSIS — M069 Rheumatoid arthritis, unspecified: Secondary | ICD-10-CM | POA: Diagnosis not present

## 2017-01-31 DIAGNOSIS — I10 Essential (primary) hypertension: Secondary | ICD-10-CM | POA: Diagnosis not present

## 2017-01-31 DIAGNOSIS — Z7982 Long term (current) use of aspirin: Secondary | ICD-10-CM | POA: Diagnosis not present

## 2017-01-31 DIAGNOSIS — Z79891 Long term (current) use of opiate analgesic: Secondary | ICD-10-CM | POA: Diagnosis not present

## 2017-01-31 DIAGNOSIS — Z87891 Personal history of nicotine dependence: Secondary | ICD-10-CM | POA: Diagnosis not present

## 2017-01-31 DIAGNOSIS — Z471 Aftercare following joint replacement surgery: Secondary | ICD-10-CM | POA: Diagnosis not present

## 2017-01-31 DIAGNOSIS — Z7952 Long term (current) use of systemic steroids: Secondary | ICD-10-CM | POA: Diagnosis not present

## 2017-01-31 DIAGNOSIS — E663 Overweight: Secondary | ICD-10-CM | POA: Diagnosis not present

## 2017-01-31 DIAGNOSIS — Z96653 Presence of artificial knee joint, bilateral: Secondary | ICD-10-CM | POA: Diagnosis not present

## 2017-02-07 DIAGNOSIS — M25662 Stiffness of left knee, not elsewhere classified: Secondary | ICD-10-CM | POA: Diagnosis not present

## 2017-02-10 DIAGNOSIS — M25662 Stiffness of left knee, not elsewhere classified: Secondary | ICD-10-CM | POA: Diagnosis not present

## 2017-02-14 DIAGNOSIS — M25662 Stiffness of left knee, not elsewhere classified: Secondary | ICD-10-CM | POA: Diagnosis not present

## 2017-02-17 DIAGNOSIS — M25662 Stiffness of left knee, not elsewhere classified: Secondary | ICD-10-CM | POA: Diagnosis not present

## 2017-02-21 DIAGNOSIS — M25662 Stiffness of left knee, not elsewhere classified: Secondary | ICD-10-CM | POA: Diagnosis not present

## 2017-02-22 DIAGNOSIS — M797 Fibromyalgia: Secondary | ICD-10-CM | POA: Diagnosis not present

## 2017-02-22 DIAGNOSIS — M8580 Other specified disorders of bone density and structure, unspecified site: Secondary | ICD-10-CM | POA: Diagnosis not present

## 2017-02-22 DIAGNOSIS — M064 Inflammatory polyarthropathy: Secondary | ICD-10-CM | POA: Diagnosis not present

## 2017-02-22 DIAGNOSIS — M79644 Pain in right finger(s): Secondary | ICD-10-CM | POA: Diagnosis not present

## 2017-02-22 DIAGNOSIS — M1990 Unspecified osteoarthritis, unspecified site: Secondary | ICD-10-CM | POA: Diagnosis not present

## 2017-02-22 DIAGNOSIS — Z79899 Other long term (current) drug therapy: Secondary | ICD-10-CM | POA: Diagnosis not present

## 2017-02-22 DIAGNOSIS — M353 Polymyalgia rheumatica: Secondary | ICD-10-CM | POA: Diagnosis not present

## 2017-02-24 DIAGNOSIS — M25662 Stiffness of left knee, not elsewhere classified: Secondary | ICD-10-CM | POA: Diagnosis not present

## 2017-02-28 DIAGNOSIS — M25662 Stiffness of left knee, not elsewhere classified: Secondary | ICD-10-CM | POA: Diagnosis not present

## 2017-03-02 DIAGNOSIS — Z471 Aftercare following joint replacement surgery: Secondary | ICD-10-CM | POA: Diagnosis not present

## 2017-03-02 DIAGNOSIS — Z96652 Presence of left artificial knee joint: Secondary | ICD-10-CM | POA: Diagnosis not present

## 2017-05-06 DIAGNOSIS — I10 Essential (primary) hypertension: Secondary | ICD-10-CM | POA: Diagnosis not present

## 2017-05-06 DIAGNOSIS — Z23 Encounter for immunization: Secondary | ICD-10-CM | POA: Diagnosis not present

## 2017-05-06 DIAGNOSIS — M353 Polymyalgia rheumatica: Secondary | ICD-10-CM | POA: Diagnosis not present

## 2017-05-06 DIAGNOSIS — E1165 Type 2 diabetes mellitus with hyperglycemia: Secondary | ICD-10-CM | POA: Diagnosis not present

## 2017-05-06 DIAGNOSIS — E782 Mixed hyperlipidemia: Secondary | ICD-10-CM | POA: Diagnosis not present

## 2017-05-24 DIAGNOSIS — M797 Fibromyalgia: Secondary | ICD-10-CM | POA: Diagnosis not present

## 2017-05-24 DIAGNOSIS — M1990 Unspecified osteoarthritis, unspecified site: Secondary | ICD-10-CM | POA: Diagnosis not present

## 2017-05-24 DIAGNOSIS — M353 Polymyalgia rheumatica: Secondary | ICD-10-CM | POA: Diagnosis not present

## 2017-05-24 DIAGNOSIS — Z79899 Other long term (current) drug therapy: Secondary | ICD-10-CM | POA: Diagnosis not present

## 2017-05-24 DIAGNOSIS — M8580 Other specified disorders of bone density and structure, unspecified site: Secondary | ICD-10-CM | POA: Diagnosis not present

## 2017-05-24 DIAGNOSIS — M79644 Pain in right finger(s): Secondary | ICD-10-CM | POA: Diagnosis not present

## 2017-06-02 DIAGNOSIS — H4710 Unspecified papilledema: Secondary | ICD-10-CM | POA: Diagnosis not present

## 2017-06-03 DIAGNOSIS — H4710 Unspecified papilledema: Secondary | ICD-10-CM | POA: Diagnosis not present

## 2017-06-06 ENCOUNTER — Other Ambulatory Visit (HOSPITAL_COMMUNITY): Payer: Self-pay | Admitting: Ophthalmology

## 2017-06-06 DIAGNOSIS — H4710 Unspecified papilledema: Secondary | ICD-10-CM

## 2017-06-20 DIAGNOSIS — H4710 Unspecified papilledema: Secondary | ICD-10-CM | POA: Diagnosis not present

## 2017-06-22 ENCOUNTER — Encounter (HOSPITAL_COMMUNITY): Payer: Self-pay | Admitting: *Deleted

## 2017-06-22 ENCOUNTER — Other Ambulatory Visit: Payer: Self-pay

## 2017-06-22 NOTE — Progress Notes (Signed)
H&P okay per Jeani Hawking, Director.

## 2017-06-22 NOTE — Progress Notes (Signed)
Pt denies SOB, chest pain, and being under the care of a cardiologist. Pt stated that an echo was performed > 20 years ago but denies having a stress test and cardiac cath. Pt denies having a chest x ray within the last year. Pt made aware to stop all vitamins an herbal medications such as  Acetylcarn-Alpha Lipoic Acid, Biotin, CO Q 10, Lysine and  Chromium. Pt stated that her diabetes is diet controlled. Pt verbalized understanding of all pre-op instructions.

## 2017-06-23 ENCOUNTER — Ambulatory Visit (HOSPITAL_COMMUNITY): Payer: PPO

## 2017-07-14 NOTE — Pre-Procedure Instructions (Addendum)
    Misty Morris  07/14/2017      [facility] - [address], [place] [address] Phone: [phone] Fax: [phone]    Your procedure is scheduled on December 11  Report to [place] at 0600 A.M.  Call this number if you have problems the morning of surgery:  [phone]   Remember:  Do not eat food or drink liquids after midnight.  Continue all medications as directed by your physician except follow these medication instructions before surgery below   Take these medicines the morning of surgery with A SIP OF WATER  Eye drops   7 days prior to surgery STOP taking any Aspirin(unless otherwise instructed by your surgeon), Aleve, Naproxen, Ibuprofen, Motrin, Advil, Goody's, BC's, all herbal medications, fish oil, and all vitamins    Do not wear jewelry, make-up or nail polish.  Do not wear lotions, powders, or perfumes, or deoderant.  Do not shave 48 hours prior to surgery.    Do not bring valuables to the hospital.  [facility] is not responsible for any belongings or valuables.  Contacts, dentures or bridgework may not be worn into surgery.  Leave your suitcase in the car.  After surgery it may be brought to your room.  For patients admitted to the hospital, discharge time will be determined by your treatment team.  Patients discharged the day of surgery will not be allowed to drive home.    Special instructions:   [place]- Preparing For Surgery  Before surgery, you can play an important role. Because skin is not sterile, your skin needs to be as free of germs as possible. You can reduce the number of germs on your skin by washing with CHG (chlorahexidine gluconate) Soap before surgery.  CHG is an antiseptic cleaner which kills germs and bonds with the skin to continue killing germs even after washing.  Please do not use if you have an  allergy to CHG or antibacterial soaps. If your skin becomes reddened/irritated stop using the CHG.  Do not shave (including legs and underarms) for at least 48 hours prior to first CHG shower. It is OK to shave your face.  Please follow these instructions carefully    Day of Surgery: Do not apply any deodorants/lotions. Please wear clean clothes to the hospital/surgery center.      Please read over the following fact sheets that you were given.

## 2017-07-15 ENCOUNTER — Inpatient Hospital Stay (HOSPITAL_COMMUNITY)
Admission: RE | Admit: 2017-07-15 | Discharge: 2017-07-15 | Disposition: A | Discharge status: Home / Self Care | Payer: PPO | Source: Ambulatory Visit

## 2017-07-19 ENCOUNTER — Ambulatory Visit (HOSPITAL_COMMUNITY): Payer: PPO

## 2017-08-09 DIAGNOSIS — I639 Cerebral infarction, unspecified: Secondary | ICD-10-CM

## 2017-08-09 HISTORY — DX: Cerebral infarction, unspecified: I63.9

## 2017-08-10 ENCOUNTER — Other Ambulatory Visit: Payer: Self-pay

## 2017-08-10 ENCOUNTER — Encounter (HOSPITAL_COMMUNITY): Payer: Self-pay | Admitting: *Deleted

## 2017-08-10 NOTE — Progress Notes (Addendum)
Instructions given to pt. reports her fasting CBG's run 110-190's  Pt instructed to arrive at 530 tomorrow for MRI  Spoke with Dr Donavan Burnet office and pt was seen there Sept 28, 2018. Message left with Dr Prudencio Burly office for updated H&P ( last one in epic is from 06-23-17) informed the office we will need an H&P within 30 days for the MRI to be done. Dr Prudencio Burly office number [phone]

## 2017-08-10 NOTE — Anesthesia Preprocedure Evaluation (Signed)
Anesthesia Evaluation  Patient identified by MRN, date of birth, ID band Patient awake    Reviewed: Allergy & Precautions, NPO status , Patient's Chart, lab work & pertinent test results  History of Anesthesia Complications Negative for: history of anesthetic complications  Airway Mallampati: II  TM Distance: >3 FB Neck ROM: Full    Dental  (+) Missing, Dental Advisory Given, Chipped   Pulmonary COPD, former smoker,    breath sounds clear to auscultation       Cardiovascular hypertension, Pt. on medications (-) angina Rhythm:Regular Rate:Normal     Neuro/Psych  Neuromuscular disease    GI/Hepatic Neg liver ROS, GERD  Controlled,  Endo/Other  diabetes  Renal/GU negative Renal ROS     Musculoskeletal  (+) Arthritis  (polyarthritis rheumatica: steroids), Fibromyalgia -  Abdominal   Peds  Hematology negative hematology ROS (+)   Anesthesia Other Findings   Reproductive/Obstetrics                             Anesthesia Physical  Anesthesia Plan  ASA: III  Anesthesia Plan: General   Post-op Pain Management:    Induction:   PONV Risk Score and Plan: 3 and Ondansetron, Dexamethasone, Midazolam and Treatment may vary due to age or medical condition  Airway Management Planned: Oral ETT  Additional Equipment:   Intra-op Plan:   Post-operative Plan: Extubation in OR  Informed Consent: I have reviewed the patients History and Physical, chart, labs and discussed the procedure including the risks, benefits and alternatives for the proposed anesthesia with the patient or authorized representative who has indicated his/her understanding and acceptance.   Dental advisory given  Plan Discussed with: CRNA  Anesthesia Plan Comments:         Anesthesia Quick Evaluation

## 2017-08-10 NOTE — Progress Notes (Signed)
H&P copy place on chart updated 07-20-17

## 2017-08-11 ENCOUNTER — Ambulatory Visit (HOSPITAL_COMMUNITY)
Admission: RE | Admit: 2017-08-11 | Discharge: 2017-08-11 | Disposition: A | Discharge status: Home / Self Care | Payer: PPO | Source: Ambulatory Visit | Attending: Ophthalmology | Admitting: Ophthalmology

## 2017-08-11 ENCOUNTER — Ambulatory Visit (HOSPITAL_COMMUNITY): Payer: PPO

## 2017-08-11 ENCOUNTER — Ambulatory Visit (HOSPITAL_COMMUNITY): Payer: PPO | Admitting: Anesthesiology

## 2017-08-11 ENCOUNTER — Ambulatory Visit (HOSPITAL_COMMUNITY)
Admission: RE | Admit: 2017-08-11 | Discharge: 2017-08-11 | Disposition: A | Discharge status: Home / Self Care | Payer: PPO | Source: Ambulatory Visit | Attending: Radiology | Admitting: Radiology

## 2017-08-11 ENCOUNTER — Encounter (HOSPITAL_COMMUNITY): Payer: Self-pay | Admitting: Certified Registered"

## 2017-08-11 ENCOUNTER — Encounter (HOSPITAL_COMMUNITY): Admission: RE | Disposition: A | Discharge status: Home / Self Care | Payer: Self-pay | Source: Ambulatory Visit

## 2017-08-11 DIAGNOSIS — H4710 Unspecified papilledema: Secondary | ICD-10-CM

## 2017-08-11 DIAGNOSIS — Z79899 Other long term (current) drug therapy: Secondary | ICD-10-CM | POA: Insufficient documentation

## 2017-08-11 DIAGNOSIS — E785 Hyperlipidemia, unspecified: Secondary | ICD-10-CM | POA: Diagnosis not present

## 2017-08-11 DIAGNOSIS — Z91040 Latex allergy status: Secondary | ICD-10-CM | POA: Diagnosis not present

## 2017-08-11 DIAGNOSIS — Z888 Allergy status to other drugs, medicaments and biological substances status: Secondary | ICD-10-CM | POA: Insufficient documentation

## 2017-08-11 DIAGNOSIS — I10 Essential (primary) hypertension: Secondary | ICD-10-CM | POA: Insufficient documentation

## 2017-08-11 DIAGNOSIS — E119 Type 2 diabetes mellitus without complications: Secondary | ICD-10-CM | POA: Insufficient documentation

## 2017-08-11 HISTORY — PX: RADIOLOGY WITH ANESTHESIA: SHX6223

## 2017-08-11 HISTORY — DX: Unspecified visual loss: H54.7

## 2017-08-11 HISTORY — DX: Pure hypercholesterolemia, unspecified: E78.00

## 2017-08-11 LAB — HEMOGLOBIN A1C
HEMOGLOBIN A1C: 8.7 % — AB (ref 4.8–5.6)
Mean Plasma Glucose: 202.99 mg/dL

## 2017-08-11 LAB — GLUCOSE, CAPILLARY
GLUCOSE-CAPILLARY: 219 mg/dL — AB (ref 65–99)
GLUCOSE-CAPILLARY: 156 mg/dL — AB (ref 65–99)

## 2017-08-11 LAB — BASIC METABOLIC PANEL
Anion gap: 9 (ref 5–15)
BUN: 11 mg/dL (ref 6–20)
CALCIUM: 9.2 mg/dL (ref 8.9–10.3)
CHLORIDE: 103 mmol/L (ref 101–111)
CO2: 24 mmol/L (ref 22–32)
CREATININE: 0.69 mg/dL (ref 0.44–1.00)
GFR calc Af Amer: >60 mL/min (ref >60)
GFR calc non Af Amer: >60 mL/min (ref >60)
Glucose, Bld: 219 mg/dL — ABNORMAL HIGH (ref 65–99)
Potassium: 3.4 mmol/L — ABNORMAL LOW (ref 3.5–5.1)
SODIUM: 136 mmol/L (ref 135–145)

## 2017-08-11 LAB — CBC
HCT: 40.2 % (ref 36.0–46.0)
HEMOGLOBIN: 13.1 g/dL (ref 12.0–15.0)
MCH: 27.3 pg (ref 26.0–34.0)
MCHC: 32.6 g/dL (ref 30.0–36.0)
MCV: 83.8 fL (ref 78.0–100.0)
PLATELETS: 277 10*3/uL (ref 150–400)
RBC: 4.8 MIL/uL (ref 3.87–5.11)
RDW: 16.3 % — ABNORMAL HIGH (ref 11.5–15.5)
WBC: 10.2 10*3/uL (ref 4.0–10.5)

## 2017-08-11 SURGERY — MRI WITH ANESTHESIA
Anesthesia: General

## 2017-08-11 MED ORDER — ONDANSETRON HCL 4 MG/2ML IJ SOLN
INTRAMUSCULAR | Status: DC | PRN
  Administered 2017-08-11: 4 mg via INTRAVENOUS

## 2017-08-11 MED ORDER — LACTATED RINGERS IV SOLN
INTRAVENOUS | Status: DC
  Administered 2017-08-11: 07:00:00 via INTRAVENOUS

## 2017-08-11 MED ORDER — FENTANYL CITRATE (PF) 100 MCG/2ML IJ SOLN
INTRAMUSCULAR | Status: DC | PRN
  Administered 2017-08-11 (×2): 50 ug via INTRAVENOUS

## 2017-08-11 MED ORDER — PROPOFOL 10 MG/ML IV BOLUS
INTRAVENOUS | Status: DC | PRN
  Administered 2017-08-11: 180 mg via INTRAVENOUS

## 2017-08-11 MED ORDER — DEXAMETHASONE SODIUM PHOSPHATE 4 MG/ML IJ SOLN
INTRAMUSCULAR | Status: DC | PRN
  Administered 2017-08-11: 4 mg via INTRAVENOUS

## 2017-08-11 MED ORDER — GADOBENATE DIMEGLUMINE 529 MG/ML IV SOLN
15.0000 mL | Freq: Once | INTRAVENOUS | Status: AC
  Administered 2017-08-11: 15 mL via INTRAVENOUS

## 2017-08-11 MED ORDER — MIDAZOLAM HCL 5 MG/5ML IJ SOLN
INTRAMUSCULAR | Status: DC | PRN
  Administered 2017-08-11: 2 mg via INTRAVENOUS

## 2017-08-11 MED ORDER — LIDOCAINE 2% (20 MG/ML) 5 ML SYRINGE
INTRAMUSCULAR | Status: DC | PRN
  Administered 2017-08-11: 100 mg via INTRAVENOUS

## 2017-08-11 MED ORDER — LACTATED RINGERS IV SOLN
INTRAVENOUS | Status: DC | PRN
  Administered 2017-08-11: 07:00:00 via INTRAVENOUS

## 2017-08-11 NOTE — Transfer of Care (Addendum)
Immediate Anesthesia Transfer of Care Note  Patient: Misty Morris  Procedure(s) Performed: MRI OF BRAIN WITH AND WITHOUT CONSTRAST, AND OF THE ORBIT WITH AND WITHOUT CONTRAST (N/A )  Patient Location: PACU  Anesthesia Type:General  Level of Consciousness: awake and patient cooperative  Airway & Oxygen Therapy: Patient Spontanous Breathing and Patient connected to face mask oxygen  Post-op Assessment: Report given to RN and Post -op Vital signs reviewed and stable  Post vital signs: Reviewed and stable  Last Vitals:  Vitals:   08/11/17 0712 08/11/17 0929  BP: (!) 176/78 (!) 157/76  Pulse: 66 89  Resp:  15  Temp:  36.5 C  SpO2:  99%    Last Pain:  Vitals:   08/11/17 0609  TempSrc: Oral      Patients Stated Pain Goal: 3 (16/83/72 9021)  Complications: No apparent anesthesia complications

## 2017-08-12 ENCOUNTER — Encounter (HOSPITAL_COMMUNITY): Payer: Self-pay | Admitting: Radiology

## 2017-08-12 DIAGNOSIS — H4710 Unspecified papilledema: Secondary | ICD-10-CM | POA: Diagnosis not present

## 2017-08-12 NOTE — Progress Notes (Signed)
H&P Updated No major changes in history. Proceed with MRI. Cadience Bradfield

## 2017-08-12 NOTE — Addendum Note (Signed)
Addendum  created 08/12/17 1053 by Nolon Nations, MD   Sign clinical note

## 2017-08-15 DIAGNOSIS — H4710 Unspecified papilledema: Secondary | ICD-10-CM | POA: Diagnosis not present

## 2017-08-15 NOTE — H&P (Signed)
Anesthesia H&P Update: History and Physical Exam reviewed; patient is OK for planned anesthetic and procedure. ? ?

## 2017-09-04 ENCOUNTER — Inpatient Hospital Stay (HOSPITAL_COMMUNITY)
Admission: EM | Admit: 2017-09-04 | Discharge: 2017-09-07 | DRG: 065 | Disposition: A | Discharge status: Home / Self Care | Payer: PPO | Attending: Internal Medicine | Admitting: Internal Medicine

## 2017-09-04 ENCOUNTER — Encounter (HOSPITAL_COMMUNITY): Payer: Self-pay

## 2017-09-04 ENCOUNTER — Other Ambulatory Visit: Payer: Self-pay

## 2017-09-04 ENCOUNTER — Emergency Department (HOSPITAL_COMMUNITY): Payer: PPO

## 2017-09-04 DIAGNOSIS — R200 Anesthesia of skin: Secondary | ICD-10-CM

## 2017-09-04 DIAGNOSIS — E1139 Type 2 diabetes mellitus with other diabetic ophthalmic complication: Secondary | ICD-10-CM | POA: Diagnosis not present

## 2017-09-04 DIAGNOSIS — H5461 Unqualified visual loss, right eye, normal vision left eye: Secondary | ICD-10-CM | POA: Diagnosis not present

## 2017-09-04 DIAGNOSIS — Z885 Allergy status to narcotic agent status: Secondary | ICD-10-CM

## 2017-09-04 DIAGNOSIS — G8191 Hemiplegia, unspecified affecting right dominant side: Secondary | ICD-10-CM | POA: Diagnosis not present

## 2017-09-04 DIAGNOSIS — I361 Nonrheumatic tricuspid (valve) insufficiency: Secondary | ICD-10-CM | POA: Diagnosis not present

## 2017-09-04 DIAGNOSIS — R402414 Glasgow coma scale score 13-15, 24 hours or more after hospital admission: Secondary | ICD-10-CM | POA: Diagnosis not present

## 2017-09-04 DIAGNOSIS — Z79899 Other long term (current) drug therapy: Secondary | ICD-10-CM | POA: Diagnosis not present

## 2017-09-04 DIAGNOSIS — M353 Polymyalgia rheumatica: Secondary | ICD-10-CM

## 2017-09-04 DIAGNOSIS — I672 Cerebral atherosclerosis: Secondary | ICD-10-CM | POA: Diagnosis not present

## 2017-09-04 DIAGNOSIS — E785 Hyperlipidemia, unspecified: Secondary | ICD-10-CM

## 2017-09-04 DIAGNOSIS — H9209 Otalgia, unspecified ear: Secondary | ICD-10-CM | POA: Diagnosis present

## 2017-09-04 DIAGNOSIS — I63412 Cerebral infarction due to embolism of left middle cerebral artery: Secondary | ICD-10-CM | POA: Diagnosis not present

## 2017-09-04 DIAGNOSIS — Z87891 Personal history of nicotine dependence: Secondary | ICD-10-CM

## 2017-09-04 DIAGNOSIS — H9190 Unspecified hearing loss, unspecified ear: Secondary | ICD-10-CM | POA: Diagnosis not present

## 2017-09-04 DIAGNOSIS — M6289 Other specified disorders of muscle: Secondary | ICD-10-CM | POA: Diagnosis not present

## 2017-09-04 DIAGNOSIS — I639 Cerebral infarction, unspecified: Secondary | ICD-10-CM | POA: Diagnosis not present

## 2017-09-04 DIAGNOSIS — I10 Essential (primary) hypertension: Secondary | ICD-10-CM | POA: Diagnosis not present

## 2017-09-04 DIAGNOSIS — I160 Hypertensive urgency: Secondary | ICD-10-CM | POA: Diagnosis present

## 2017-09-04 DIAGNOSIS — F40240 Claustrophobia: Secondary | ICD-10-CM | POA: Diagnosis present

## 2017-09-04 DIAGNOSIS — R531 Weakness: Secondary | ICD-10-CM | POA: Diagnosis not present

## 2017-09-04 DIAGNOSIS — Z96653 Presence of artificial knee joint, bilateral: Secondary | ICD-10-CM | POA: Diagnosis not present

## 2017-09-04 DIAGNOSIS — Z91040 Latex allergy status: Secondary | ICD-10-CM

## 2017-09-04 DIAGNOSIS — Z7952 Long term (current) use of systemic steroids: Secondary | ICD-10-CM

## 2017-09-04 DIAGNOSIS — M797 Fibromyalgia: Secondary | ICD-10-CM

## 2017-09-04 DIAGNOSIS — J449 Chronic obstructive pulmonary disease, unspecified: Secondary | ICD-10-CM | POA: Diagnosis not present

## 2017-09-04 DIAGNOSIS — R29810 Facial weakness: Secondary | ICD-10-CM | POA: Diagnosis not present

## 2017-09-04 DIAGNOSIS — I63432 Cerebral infarction due to embolism of left posterior cerebral artery: Secondary | ICD-10-CM | POA: Diagnosis not present

## 2017-09-04 DIAGNOSIS — E1165 Type 2 diabetes mellitus with hyperglycemia: Secondary | ICD-10-CM | POA: Diagnosis not present

## 2017-09-04 DIAGNOSIS — E1159 Type 2 diabetes mellitus with other circulatory complications: Secondary | ICD-10-CM

## 2017-09-04 DIAGNOSIS — E876 Hypokalemia: Secondary | ICD-10-CM | POA: Diagnosis not present

## 2017-09-04 DIAGNOSIS — H47011 Ischemic optic neuropathy, right eye: Secondary | ICD-10-CM | POA: Diagnosis not present

## 2017-09-04 DIAGNOSIS — I4510 Unspecified right bundle-branch block: Secondary | ICD-10-CM | POA: Diagnosis not present

## 2017-09-04 DIAGNOSIS — I63532 Cerebral infarction due to unspecified occlusion or stenosis of left posterior cerebral artery: Principal | ICD-10-CM | POA: Diagnosis present

## 2017-09-04 DIAGNOSIS — R29702 NIHSS score 2: Secondary | ICD-10-CM | POA: Diagnosis present

## 2017-09-04 DIAGNOSIS — H538 Other visual disturbances: Secondary | ICD-10-CM | POA: Diagnosis not present

## 2017-09-04 DIAGNOSIS — Z882 Allergy status to sulfonamides status: Secondary | ICD-10-CM

## 2017-09-04 DIAGNOSIS — E119 Type 2 diabetes mellitus without complications: Secondary | ICD-10-CM

## 2017-09-04 DIAGNOSIS — I071 Rheumatic tricuspid insufficiency: Secondary | ICD-10-CM | POA: Diagnosis not present

## 2017-09-04 LAB — APTT: APTT: 28 s (ref 24–36)

## 2017-09-04 LAB — DIFFERENTIAL
BASOS ABS: 0 10*3/uL (ref 0.0–0.1)
Basophils Relative: 0 %
Eosinophils Absolute: 0.1 10*3/uL (ref 0.0–0.7)
Eosinophils Relative: 1 %
LYMPHS ABS: 3 10*3/uL (ref 0.7–4.0)
Lymphocytes Relative: 29 %
MONOS PCT: 6 %
Monocytes Absolute: 0.6 10*3/uL (ref 0.1–1.0)
NEUTROS ABS: 6.6 10*3/uL (ref 1.7–7.7)
NEUTROS PCT: 64 %

## 2017-09-04 LAB — COMPREHENSIVE METABOLIC PANEL
ALBUMIN: 4.2 g/dL (ref 3.5–5.0)
ALT: 19 U/L (ref 14–54)
AST: 18 U/L (ref 15–41)
Alkaline Phosphatase: 84 U/L (ref 38–126)
Anion gap: 14 (ref 5–15)
BILIRUBIN TOTAL: 0.7 mg/dL (ref 0.3–1.2)
BUN: 11 mg/dL (ref 6–20)
CHLORIDE: 102 mmol/L (ref 101–111)
CO2: 22 mmol/L (ref 22–32)
CREATININE: 0.83 mg/dL (ref 0.44–1.00)
Calcium: 9.3 mg/dL (ref 8.9–10.3)
GFR calc Af Amer: >60 mL/min (ref >60)
GLUCOSE: 236 mg/dL — AB (ref 65–99)
POTASSIUM: 3.3 mmol/L — AB (ref 3.5–5.1)
Sodium: 138 mmol/L (ref 135–145)
Total Protein: 7 g/dL (ref 6.5–8.1)

## 2017-09-04 LAB — I-STAT TROPONIN, ED: Troponin i, poc: 0 ng/mL (ref 0.00–0.08)

## 2017-09-04 LAB — CBC
HEMATOCRIT: 44 % (ref 36.0–46.0)
HEMOGLOBIN: 14.5 g/dL (ref 12.0–15.0)
MCH: 27.7 pg (ref 26.0–34.0)
MCHC: 33 g/dL (ref 30.0–36.0)
MCV: 84 fL (ref 78.0–100.0)
Platelets: 308 10*3/uL (ref 150–400)
RBC: 5.24 MIL/uL — AB (ref 3.87–5.11)
RDW: 15.7 % — ABNORMAL HIGH (ref 11.5–15.5)
WBC: 10.3 10*3/uL (ref 4.0–10.5)

## 2017-09-04 LAB — I-STAT CHEM 8, ED
BUN: 12 mg/dL (ref 6–20)
CREATININE: 0.7 mg/dL (ref 0.44–1.00)
Calcium, Ion: 1.14 mmol/L — ABNORMAL LOW (ref 1.15–1.40)
Chloride: 102 mmol/L (ref 101–111)
GLUCOSE: 234 mg/dL — AB (ref 65–99)
HEMATOCRIT: 44 % (ref 36.0–46.0)
Hemoglobin: 15 g/dL (ref 12.0–15.0)
POTASSIUM: 3.3 mmol/L — AB (ref 3.5–5.1)
Sodium: 141 mmol/L (ref 135–145)
TCO2: 25 mmol/L (ref 22–32)

## 2017-09-04 LAB — PROTIME-INR
INR: 0.93
Prothrombin Time: 12.4 seconds (ref 11.4–15.2)

## 2017-09-04 LAB — GLUCOSE, CAPILLARY: Glucose-Capillary: 201 mg/dL — ABNORMAL HIGH (ref 65–99)

## 2017-09-04 MED ORDER — TIMOLOL MALEATE PF 0.5 % OP SOLN: 1.0000 [drp] | Freq: Every day | OPHTHALMIC | Status: DC

## 2017-09-04 MED ORDER — PREDNISONE 1 MG PO TABS: 4.0000 mg | ORAL_TABLET | Freq: Every evening | ORAL | Status: DC

## 2017-09-04 MED ORDER — PREDNISONE 1 MG PO TABS
4.0000 mg | ORAL_TABLET | Freq: Every day | ORAL | Status: DC
  Filled 2017-09-04: qty 4

## 2017-09-04 MED ORDER — STROKE: EARLY STAGES OF RECOVERY BOOK
Freq: Once | Status: AC
  Administered 2017-09-04: 22:00:00
  Filled 2017-09-04: qty 1

## 2017-09-04 MED ORDER — PREDNISOLONE 5 MG PO TABS: 5.0000 mg | ORAL_TABLET | Freq: Every evening | ORAL | Status: DC

## 2017-09-04 MED ORDER — ATORVASTATIN CALCIUM 40 MG PO TABS: 40.0000 mg | ORAL_TABLET | Freq: Every day | ORAL | Status: DC

## 2017-09-04 MED ORDER — DIPHENHYDRAMINE HCL 25 MG PO CAPS
25.0000 mg | ORAL_CAPSULE | Freq: Every day | ORAL | Status: DC
  Administered 2017-09-04 – 2017-09-06 (×3): 25 mg via ORAL
  Filled 2017-09-04 (×3): qty 1

## 2017-09-04 MED ORDER — PREDNISOLONE 5 MG PO TABS
5.0000 mg | ORAL_TABLET | Freq: Every day | ORAL | Status: DC
  Administered 2017-09-04 – 2017-09-05 (×2): 5 mg via ORAL
  Filled 2017-09-04 (×2): qty 1

## 2017-09-04 MED ORDER — TIMOLOL MALEATE 0.5 % OP SOLN
1.0000 [drp] | Freq: Every day | OPHTHALMIC | Status: DC
  Filled 2017-09-04: qty 5

## 2017-09-04 MED ORDER — PREDNISONE 1 MG PO TABS
4.0000 mg | ORAL_TABLET | Freq: Every day | ORAL | Status: DC
  Administered 2017-09-04: 4 mg via ORAL
  Filled 2017-09-04: qty 4

## 2017-09-04 MED ORDER — PREDNISOLONE 5 MG PO TABS
5.0000 mg | ORAL_TABLET | Freq: Every day | ORAL | Status: DC
  Filled 2017-09-04: qty 1

## 2017-09-04 MED ORDER — PREDNISOLONE 5 MG PO TABS
5.0000 mg | ORAL_TABLET | Freq: Every day | ORAL | Status: DC
  Administered 2017-09-04: 5 mg via ORAL
  Filled 2017-09-04: qty 1

## 2017-09-04 MED ORDER — ASPIRIN 325 MG PO TABS
325.0000 mg | ORAL_TABLET | Freq: Every day | ORAL | Status: DC
  Administered 2017-09-04 – 2017-09-07 (×3): 325 mg via ORAL
  Filled 2017-09-04 (×3): qty 1

## 2017-09-04 MED ORDER — ATORVASTATIN CALCIUM 80 MG PO TABS
80.0000 mg | ORAL_TABLET | Freq: Every day | ORAL | Status: DC
  Filled 2017-09-04 (×2): qty 1

## 2017-09-04 MED ORDER — PREDNISONE 1 MG PO TABS
4.0000 mg | ORAL_TABLET | Freq: Every day | ORAL | Status: DC
Start: 2017-09-04 — End: 2017-09-04
  Filled 2017-09-04: qty 4

## 2017-09-04 MED ORDER — LATANOPROST 0.005 % OP SOLN
1.0000 [drp] | Freq: Every day | OPHTHALMIC | Status: DC
  Filled 2017-09-04: qty 2.5

## 2017-09-04 NOTE — ED Provider Notes (Signed)
[facility] EMERGENCY DEPARTMENT Provider Note   CSN: [id number] Arrival date & time: 09/04/17  1046     History   Chief Complaint Chief Complaint  Patient presents with  . Numbness    HPI Misty Morris is a 67 y.o. female.  Lagi.Manning F w/ PMH including T2DM, HTN, HLD, fibromyalgia, RA who p/w R sided numbness.  Patient felt normal when she went to bed last night.  She woke up at 4 AM to use the restroom and states that she did not notice anything abnormal when she walked to the bathroom.  Upon waking later in the morning, she noticed R sided numbness involving R arm and leg that seemed to get worse throughout the morning. She initially denied weakness at triage but notes to me that she has had problems using R hand and walking due to R leg weakness.  She has never had these symptoms before.  She denies any vision changes, fevers, vomiting, or recent illness.  No personal or family history of stroke.   The history is provided by the patient.    Past Medical History:  Diagnosis Date  . Diabetes mellitus without complication (HCC)    diet controlled  . Fibromyalgia   . GERD (gastroesophageal reflux disease)   . Hypercholesterolemia   . Hypertension   . Polyarthritis rheumatica (HCC)    RA  . Shingles   . Vision impairment    right eye    Patient Active Problem List   Diagnosis Date Noted  . S/P left UKR 01/17/2017  . Overweight (BMI 25.0-29.9) 10/01/2015  . S/P right TKA 09/30/2015    Past Surgical History:  Procedure Laterality Date  . ABDOMINAL HYSTERECTOMY    . [place] 1  1980's  . PARTIAL KNEE ARTHROPLASTY Left 01/17/2017   Procedure: UNICOMPARTMENTAL LEFT KNEE;  Surgeon: Paralee Cancel, MD;  Location: [facility];  Service: Orthopedics;  Laterality: Left;  . RADIOLOGY WITH ANESTHESIA N/A 08/11/2017   Procedure: MRI OF BRAIN WITH AND WITHOUT CONSTRAST, AND OF THE ORBIT WITH AND WITHOUT CONTRAST;  Surgeon: Radiologist, Medication, MD;  Location: [facility];   Service: Radiology;  Laterality: N/A;  . RHINOPLASTY    . TOTAL KNEE ARTHROPLASTY Right 09/30/2015   Procedure: TOTAL RIGHT KNEE ARTHROPLASTY;  Surgeon: Paralee Cancel, MD;  Location: [facility];  Service: Orthopedics;  Laterality: Right;    OB History    No data available       Home Medications    Prior to Admission medications   Medication Sig Start Date End Date Taking? Authorizing Provider  Acetylcarn-Alpha Lipoic Acid 400-200 MG CAPS Take 1 capsule by mouth 2 (two) times daily.   Yes [provider]  amLODipine (NORVASC) 5 MG tablet Take 5 mg by mouth every evening.  08/20/15  Yes [provider]  Biotin 1000 MCG tablet Take 1,000 mcg by mouth every evening.   Yes [provider]  Calcium Carb-Cholecalciferol (CALCIUM 1000 + D PO) Take 1 tablet by mouth every evening.   Yes [provider]  Cholecalciferol (VITAMIN D) 2000 units CAPS Take 4,000 Units by mouth daily.   Yes [provider]  Chromium Picolinate 200 MCG CAPS Take 200 mcg by mouth daily.   Yes [provider]  Coenzyme Q10 (COQ-10) 100 MG CAPS Take 100 mg by mouth daily.   Yes [provider]  diphenhydrAMINE (BENADRYL) 25 MG tablet Take 25 mg by mouth at bedtime.   Yes [provider]  Lysine 500 MG CAPS Take 500 mg by mouth every evening.   Yes [provider]  Omega-3 Fatty Acids (FISH OIL) 1200 MG CAPS Take 1,200 mg 2 (two) times daily by mouth.   Yes [provider]  prednisoLONE 5 MG TABS tablet Take 5 mg by mouth every evening. Take with 4 mg prednisone to add up to 9mg s daily   Yes [provider]  predniSONE (DELTASONE) 1 MG tablet Take 4 mg by mouth every evening. Take with 5mg s, add up to 9mg s daily   Yes [provider]  TIMOPTIC OCUDOSE 0.5 % SOLN Place 1 drop into both eyes daily. Preservative free only 08/18/17  Yes [provider]  ZIOPTAN 0.0015 % SOLN Place 1 drop into both eyes at bedtime.  07/23/15  Yes [provider]  docusate sodium (COLACE) 100 MG capsule Take 1 capsule (100 mg total) by mouth 2 (two) times daily. Patient not taking: Reported on 06/16/2017 01/17/17   Danae Orleans, PA-C  ferrous sulfate (FERROUSUL) 325 (65 FE) MG tablet Take 1 tablet (325 mg total) by mouth 3 (three) times daily with meals. Patient not taking: Reported on 06/16/2017 01/17/17   Danae Orleans, PA-C  HYDROmorphone (DILAUDID) 2 MG tablet Take 1-2 tablets (2-4 mg total) by mouth every 6 (six) hours as needed for severe pain (breakthrough pain). Patient not taking: Reported on 06/16/2017 01/17/17   Danae Orleans, PA-C  methocarbamol (ROBAXIN) 500 MG tablet Take 1 tablet (500 mg total) by mouth every 6 (six) hours as needed for muscle spasms. Patient not taking: Reported on 06/16/2017 01/17/17   Danae Orleans, PA-C  polyethylene glycol (MIRALAX / Floria Raveling) packet Take 17 g by mouth 2 (two) times daily. Patient not taking: Reported on 06/16/2017 01/17/17   Danae Orleans, PA-C    Family History Family History  Problem Relation Age of Onset  . Hypertension Mother   . Seizures Mother   . Hypertension Father   . Hypertension Sister   . Pancreatic cancer Other     Social History Social History   Tobacco Use  . Smoking status: Former Smoker    Types: Cigarettes    Last attempt to quit: 12/27/2011    Years since quitting: 5.6  . Smokeless tobacco: Never Used  Substance Use Topics  . Alcohol use: No  . Drug use: No     Allergies   Codeine; Demerol [meperidine]; Latex; Morphine and related; Sulfa antibiotics; Tramadol; and Vicodin [hydrocodone-acetaminophen]   Review of Systems Review of Systems All other systems reviewed and are negative except that which was mentioned in HPI  Physical Exam Updated Vital Signs BP (!) 153/88   Pulse 81   Temp 98.5 F (36.9 C) (Oral)   Resp 16   SpO2 92%   Physical Exam  Constitutional: She is oriented to person, place, and time. She  appears well-developed and well-nourished. No distress.  Awake, alert  HENT:  Head: Normocephalic and atraumatic.  Eyes: Conjunctivae and EOM are normal. Pupils are equal, round, and reactive to light.  Neck: Neck supple.  Cardiovascular: Normal rate, regular rhythm and normal heart sounds.  No murmur heard. Pulmonary/Chest: Effort normal and breath sounds normal. No respiratory distress.  Abdominal: Soft. Bowel sounds are normal. She exhibits no distension. There is no tenderness.  Musculoskeletal: She exhibits no edema.  Neurological: She is alert and oriented to person, place, and time. She has normal reflexes.  Fluent speech, dysmetria R hand finger-to-nose testing, + mild right pronator drift, no clonus  5/5 strength and normal sensation LUE, LLE 4/5 grip strength RUE, 3/5 strength RLE straight leg raise, 4/5 plantar and dorsiflexion Very mild weakness at left corner of mouth compared to right, remainder of CN intact  Skin: Skin is warm and dry.  Psychiatric: She has a normal mood and affect. Judgment and thought content normal.  Nursing note and vitals reviewed.    ED Treatments / Results  Labs (all labs ordered are listed, but only abnormal results are displayed) Labs Reviewed  CBC - Abnormal; Notable for the following components:      Result Value   RBC 5.24 (*)    RDW 15.7 (*)    All other components within normal limits  COMPREHENSIVE METABOLIC PANEL - Abnormal; Notable for the following components:   Potassium 3.3 (*)    Glucose, Bld 236 (*)    All other components within normal limits  I-STAT CHEM 8, ED - Abnormal; Notable for the following components:   Potassium 3.3 (*)    Glucose, Bld 234 (*)    Calcium, Ion 1.14 (*)    All other components within normal limits  PROTIME-INR  APTT  DIFFERENTIAL  I-STAT TROPONIN, ED  CBG MONITORING, ED    EKG  EKG Interpretation  Date/Time:  Sunday September 04 2017 10:48:35 EST Ventricular Rate:  96 PR  Interval:  126 QRS Duration: 114 QT Interval:  362 QTC Calculation: 457 R Axis:   59 Text Interpretation:  Normal sinus rhythm Incomplete right bundle branch block Cannot rule out Inferior infarct , age undetermined Abnormal ECG incomplete RBBB new from previous tracing Confirmed by Theotis Burrow [phone]) on 09/04/2017 11:31:29 AM       Radiology Ct Head Wo Contrast  Result Date: 09/04/2017 CLINICAL DATA:  Numbness of the right arm and leg beginning last night. EXAM: CT HEAD WITHOUT CONTRAST TECHNIQUE: Contiguous axial images were obtained from the base of the skull through the vertex without intravenous contrast. COMPARISON:  MRI 08/11/2017 FINDINGS: Brain: The there chronic small-vessel ischemic changes of the hemispheric white matter. No sign of acute infarction. No cortical or large vessel territory infarction. No mass lesion, hemorrhage, hydrocephalus or extra-axial collection. Vascular: There is atherosclerotic calcification of the major vessels at the base of the brain. Skull: Calvarium is normal. Sinuses/Orbits: Clear/normal. Other: Previous maxillary surgery. IMPRESSION: No acute finding by CT. Chronic small-vessel ischemic changes of the hemispheric white matter. Electronically Signed   By: [name] M.D.   On: 09/04/2017 12:27    Procedures Procedures (including critical care time)  Medications Ordered in ED Medications - No data to display   Initial Impression / Assessment and Plan / ED Course  I have reviewed the triage vital signs and the nursing notes.  Pertinent labs & imaging results that were available during my care of the patient were reviewed by me and considered in my medical decision making (see chart for details).    At time of arrival to ED, pt was last feeling normal 6 hours previously thus outside of tPA window. She had mild R sided weakness leg greater than arm and right sided numbness. Concern for acute stroke esp given risk factors. Labs notable only for  glucose 234. Head CT negative acute. Contacted neurology, discussed w/ Dr. Rory Percy, who will see the patient in consultation and agrees with admission to evaluate for stroke.  Discussed admission with [facility] hospitalist, Dr. Evangeline Gula, appreciate assistance.  Patient admitted for further care.  Final Clinical Impressions(s) / ED Diagnoses   Final  diagnoses:  Acute right-sided weakness  Right sided numbness    ED Discharge Orders    None       Jahni Paul, Wenda Overland, MD 09/04/17 1337

## 2017-09-04 NOTE — ED Triage Notes (Signed)
Pt presents for evaluation of numbness to R leg and arm. Pt LKW around 1700 last night. Pt has no focal weakness. Denies use of blood thinners or hx of CVA. Pt AxO x4.

## 2017-09-04 NOTE — ED Notes (Signed)
Neuro at bedside.

## 2017-09-04 NOTE — ED Notes (Signed)
Medications not verified by pharmacy

## 2017-09-04 NOTE — H&P (Signed)
History and Physical    Misty Morris SAY:[id number] DOB: 1951/03/29 DOA: 09/04/2017  PCP: Alroy Dust, L.Marlou Sa, MD  Patient coming from: Home  I have personally briefly reviewed patient's old medical records in [place]  Chief Complaint: Numbness of right leg and arm  HPI: Misty Morris is a 67 y.o. female with medical history significant of diabetes type 2 diet-controlled, hypertension, hyperlipidemia, fibromyalgia, and polymyalgia rheumatica who presents with complaints of right-sided numbness.  Patient awoke at 4 AM and was normal as best she could tell but she was somewhat drowsy.  When she awoke later in the morning she had some numbness and weakness of her right arm and hand and some right leg weakness.  She had a slight left facial droop on presentation to the emergency department which appears to have resolved.  She denies any noncompliance with medications.  She took her medications this morning.  She is followed primarily by Dr. Donnie Coffin for her diabetes and hypertension and by Dr. Jenetta Downer from rheumatology for her polymyalgia rheumatica and fibromyalgia.  She has not had any changes in her medications.  Presentation to the emergency department her blood pressure is elevated.  Patient reports never having had symptoms like this before.  She denies any vision changes.  Had no fever, vomiting, diarrhea, or recent illness.  She has no personal or family history of stroke.  She does have several risk factors as noted above.  NIH stroke scale based on evaluation is 2.  She is a very good historian.  The patient was seen in the emergency department by Dr. Malen Gauze from neurology who feels that she should be admitted and she will need an MRI with full sedation.  She had an MRI earlier this month and needed to have anesthesia sedation was unable to undergo the MRI even with benzodiazepine treatment.  This was done for visual blindness appears to be improving.  Review of Systems: Review of  Systems  Constitutional: Negative for chills, fever and malaise/fatigue.  HENT: Positive for ear pain and hearing loss. Negative for tinnitus.   Eyes: Positive for blurred vision. Negative for double vision and photophobia.  Respiratory: Negative for cough, hemoptysis and sputum production.   Cardiovascular: Negative for chest pain, palpitations and orthopnea.  Gastrointestinal: Negative for diarrhea, heartburn, nausea and vomiting.  Genitourinary: Negative for dysuria and urgency.  Musculoskeletal: Negative for myalgias and neck pain.  Skin: Negative for itching and rash.  Neurological: Positive for focal weakness and weakness. Negative for dizziness, tingling, sensory change, loss of consciousness and headaches.  Endo/Heme/Allergies: Negative for environmental allergies. Does not bruise/bleed easily.  Psychiatric/Behavioral: Negative for depression and suicidal ideas.    Past Medical History:  Diagnosis Date  . Diabetes mellitus without complication (HCC)    diet controlled  . Fibromyalgia   . GERD (gastroesophageal reflux disease)   . Hypercholesterolemia   . Hypertension   . Polyarthritis rheumatica (HCC)    RA  . Shingles   . Vision impairment    right eye    Past Surgical History:  Procedure Laterality Date  . ABDOMINAL HYSTERECTOMY    . [place] 1  1980's  . PARTIAL KNEE ARTHROPLASTY Left 01/17/2017   Procedure: UNICOMPARTMENTAL LEFT KNEE;  Surgeon: Paralee Cancel, MD;  Location: [facility];  Service: Orthopedics;  Laterality: Left;  . RADIOLOGY WITH ANESTHESIA N/A 08/11/2017   Procedure: MRI OF BRAIN WITH AND WITHOUT CONSTRAST, AND OF THE ORBIT WITH AND WITHOUT CONTRAST;  Surgeon: Radiologist, Medication, MD;  Location: [facility];  Service: Radiology;  Laterality: N/A;  . RHINOPLASTY    . TOTAL KNEE ARTHROPLASTY Right 09/30/2015   Procedure: TOTAL RIGHT KNEE ARTHROPLASTY;  Surgeon: Paralee Cancel, MD;  Location: [facility];  Service: Orthopedics;  Laterality: Right;     reports that  she quit smoking about 5 years ago. Her smoking use included cigarettes. she has never used smokeless tobacco. She reports that she does not drink alcohol or use drugs.  Allergies  Allergen Reactions  . Codeine Hives  . Demerol [Meperidine] Hives  . Latex Dermatitis and Other (See Comments)    Blistering   . Morphine And Related Hives  . Sulfa Antibiotics Hives  . Tramadol Hives  . Vicodin [Hydrocodone-Acetaminophen] Itching    Elevated blood pressure. Tolerates low doses.    Family History  Problem Relation Age of Onset  . Hypertension Mother   . Seizures Mother   . Hypertension Father   . Hypertension Sister   . Pancreatic cancer Other     Prior to Admission medications   Medication Sig Start Date End Date Taking? Authorizing Provider  Acetylcarn-Alpha Lipoic Acid 400-200 MG CAPS Take 1 capsule by mouth 2 (two) times daily.   Yes [provider]  amLODipine (NORVASC) 5 MG tablet Take 5 mg by mouth every evening.  08/20/15  Yes [provider]  Biotin 1000 MCG tablet Take 1,000 mcg by mouth every evening.   Yes [provider]  Calcium Carb-Cholecalciferol (CALCIUM 1000 + D PO) Take 1 tablet by mouth every evening.   Yes [provider]  Cholecalciferol (VITAMIN D) 2000 units CAPS Take 4,000 Units by mouth daily.   Yes [provider]  Chromium Picolinate 200 MCG CAPS Take 200 mcg by mouth daily.   Yes [provider]  Coenzyme Q10 (COQ-10) 100 MG CAPS Take 100 mg by mouth daily.   Yes [provider]  diphenhydrAMINE (BENADRYL) 25 MG tablet Take 25 mg by mouth at bedtime.   Yes [provider]  Lysine 500 MG CAPS Take 500 mg by mouth every evening.   Yes [provider]  Omega-3 Fatty Acids (FISH OIL) 1200 MG CAPS Take 1,200 mg 2 (two) times daily by mouth.   Yes [provider]  prednisoLONE 5 MG TABS tablet Take 5 mg by mouth every evening. Take with 4 mg prednisone to add up to 9mg s  daily   Yes [provider]  predniSONE (DELTASONE) 1 MG tablet Take 4 mg by mouth every evening. Take with 5mg s, add up to 9mg s daily   Yes [provider]  TIMOPTIC OCUDOSE 0.5 % SOLN Place 1 drop into both eyes daily. Preservative free only 08/18/17  Yes [provider]  ZIOPTAN 0.0015 % SOLN Place 1 drop into both eyes at bedtime. 07/23/15  Yes [provider]    Physical Exam: Vitals:   09/04/17 1230 09/04/17 1245 09/04/17 1300 09/04/17 1315  BP: (!) 148/82 (!) 162/88 (!) 150/83 (!) 153/88  Pulse: 76 76 78 81  Resp: 16 14 15 16   Temp:      TempSrc:      SpO2: 100% 96% 93% 92%   .TCS Constitutional: NAD, calm, comfortable Vitals:   09/04/17 1230 09/04/17 1245 09/04/17 1300 09/04/17 1315  BP: (!) 148/82 (!) 162/88 (!) 150/83 (!) 153/88  Pulse: 76 76 78 81  Resp: 16 14 15 16   Temp:      TempSrc:      SpO2: 100% 96% 93%  92%   Eyes: PERRL, lids and conjunctivae normal ENMT: Mucous membranes are moist. Posterior pharynx clear of any exudate or lesions.Normal dentition.  Neck: normal, supple, no masses, no thyromegaly Respiratory: clear to auscultation bilaterally, no wheezing, no crackles. Normal respiratory effort. No accessory muscle use.  Cardiovascular: Regular rate and rhythm, no murmurs / rubs / gallops. No extremity edema. 2+ pedal pulses. No carotid bruits.  Abdomen: no tenderness, no masses palpated. No hepatosplenomegaly. Bowel sounds positive.  Musculoskeletal: no clubbing / cyanosis. No joint deformity upper and lower extremities. Good ROM, no contractures. Normal muscle tone.  Skin: no rashes, lesions, ulcers. No induration Neurologic: CN 2-12 grossly intact. Sensation lightly decreased sensory in the right upper extremity., DTR +2 in the right upper extremity and right lower extremity normal otherwise. Strength 3+/5 in the right upper extremity and right lower extremity with 5 out of 5 on the left side. Psychiatric: Normal judgment  and insight. Alert and oriented x 3. Normal mood.    Labs on Admission: I have personally reviewed following labs and imaging studies  CBC: Recent Labs  Lab 09/04/17 1048 09/04/17 1102  WBC 10.3  --   NEUTROABS 6.6  --   HGB 14.5 15.0  HCT 44.0 44.0  MCV 84.0  --   PLT 308  --    Basic Metabolic Panel: Recent Labs  Lab 09/04/17 1048 09/04/17 1102  NA 138 141  K 3.3* 3.3*  CL 102 102  CO2 22  --   GLUCOSE 236* 234*  BUN 11 12  CREATININE 0.83 0.70  CALCIUM 9.3  --    GFR: CrCl cannot be calculated (Unknown ideal weight.). Liver Function Tests: Recent Labs  Lab 09/04/17 1048  AST 18  ALT 19  ALKPHOS 84  BILITOT 0.7  PROT 7.0  ALBUMIN 4.2   No results for input(s): LIPASE, AMYLASE in the last 168 hours. No results for input(s): AMMONIA in the last 168 hours. Coagulation Profile: Recent Labs  Lab 09/04/17 1048  INR 0.93   Cardiac Enzymes: No results for input(s): CKTOTAL, CKMB, CKMBINDEX, TROPONINI in the last 168 hours. BNP (last 3 results) No results for input(s): PROBNP in the last 8760 hours. HbA1C: No results for input(s): HGBA1C in the last 72 hours. CBG: No results for input(s): GLUCAP in the last 168 hours. Lipid Profile: No results for input(s): CHOL, HDL, LDLCALC, TRIG, CHOLHDL, LDLDIRECT in the last 72 hours. Thyroid Function Tests: No results for input(s): TSH, T4TOTAL, FREET4, T3FREE, THYROIDAB in the last 72 hours. Anemia Panel: No results for input(s): VITAMINB12, FOLATE, FERRITIN, TIBC, IRON, RETICCTPCT in the last 72 hours. Urine analysis:    Component Value Date/Time   COLORURINE YELLOW 09/23/2015 1110   APPEARANCEUR CLEAR 09/23/2015 1110   LABSPEC 1.025 09/23/2015 1110   PHURINE 6.0 09/23/2015 1110   GLUCOSEU 100 (A) 09/23/2015 1110   HGBUR NEGATIVE 09/23/2015 1110   BILIRUBINUR NEGATIVE 09/23/2015 1110   KETONESUR NEGATIVE 09/23/2015 1110   PROTEINUR NEGATIVE 09/23/2015 1110   UROBILINOGEN 0.2 06/04/2011 1034   NITRITE  NEGATIVE 09/23/2015 1110   LEUKOCYTESUR NEGATIVE 09/23/2015 1110    Radiological Exams on Admission: Ct Head Wo Contrast  Result Date: 09/04/2017 CLINICAL DATA:  Numbness of the right arm and leg beginning last night. EXAM: CT HEAD WITHOUT CONTRAST TECHNIQUE: Contiguous axial images were obtained from the base of the skull through the vertex without intravenous contrast. COMPARISON:  MRI 08/11/2017 FINDINGS: Brain: The there chronic small-vessel ischemic changes of the hemispheric white matter. No  sign of acute infarction. No cortical or large vessel territory infarction. No mass lesion, hemorrhage, hydrocephalus or extra-axial collection. Vascular: There is atherosclerotic calcification of the major vessels at the base of the brain. Skull: Calvarium is normal. Sinuses/Orbits: Clear/normal. Other: Previous maxillary surgery. IMPRESSION: No acute finding by CT. Chronic small-vessel ischemic changes of the hemispheric white matter. Electronically Signed   By: [name] M.D.   On: 09/04/2017 12:27    EKG: Independently reviewed.  Normal sinus rhythm with an incomplete right bundle branch block  Assessment/Plan Principal Problem:   Stroke [facility]) Active Problems:   Hypertension complicating diabetes ([place])   Type 2 diabetes mellitus with ophthalmic complication (HCC)   Polymyalgia rheumatica (HCC)   Fibromyalgia   1.  Stroke: Patient will be admitted under the stroke protocol.  We will place her on telemetry.  We will obtain an MRI of the head with full sedation given her difficulty with MRIs in the past.  Appears to be a middle cerebral artery stroke given her signs and symptoms.  Will start aspirin.  As well as a statin.  Appreciate consultation by Dr. Malen Gauze.  2.  Hypertension complicating diabetes: Hold amlodipine permissive hypertension for now.  3.  Type 2 diabetes mellitus with ophthalmologic complication: Patient with elevated blood glucoses did not appear to be on any medication.  We  will start her on sliding scale coverage with some mealtime insulin.  We will give her some low-dose long-acting insulin as well.  Check a hemoglobin A1c patient may require insulin at discharge.  4.  Polymyalgia rheumatica: Continue home medication regimen.  Patient sees Dr. Sharol Given.  5.  Fibromyalgia: Continue home treatment regimen.  Follow-up with Dr. Jenetta Downer.    DVT prophylaxis: Lovenox Code Status: Full Family Communication: Patient's sister present at the time of evaluation Disposition Plan: Likely home in 24 hours Consults called: Dr. Malen Gauze from neurology Admission status: Observation   Lady Deutscher MD [place] Pager [phone]  If 7PM-7AM, please contact night-coverage [email or web address] Password [email or web address]  09/04/2017, 2:09 PM

## 2017-09-04 NOTE — Progress Notes (Signed)
Patient received to room 3W29 from ED with cc of right sided numbness and right facial droop, which has since dissipated.  Patient states has "slight" residual numbness in RUE and RLE laterally, but that it is "much better".  She was introduced to staff and unit routine.  Bed placed in low position and fall precautions re-iterated with patient.  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  She utilizes homeopathic medication regimens for her diabetes (which is poorly controlled) and her HTN.  Patient is refusing Lipitor, stating that she must first speak with the physician before taking any of "those" medications.  Evidenced Based Practise explained to her as a rationale for certain regimens, but she discounts this as 'hear-say'.  Does not adhere to a diabetic dietary regimen.  Has had visual difficulties, especially over the past two or three years.  Refusing to use our eye gtts as they are not preservative free.  Encouraged to bring her own gtts from home.

## 2017-09-04 NOTE — Consult Note (Addendum)
Referring Physician: Sharlett Iles, MD    Chief Complaint: right sided weakness  HPI: Misty Morris is an 67 y.o. female with a past medical history of type 2 diabetes mellitus hypertension, dyslipidemia, fibromyalgia rheumatica who presented to the ER this morning with right arm numbness.  Patient was in her usual state of health yesterday and went to bed around 1030 bulk of all 4 AM this morning went to the bathroom, did not notice any abnormalities when she went to the bathroom.  Upon waking up this morning she noticed right arm and right leg numbness that seemed to get worse throughout the morning with some weakness in the right arm as well as unsteady gait.  She denies ever having such symptoms, personal history of stroke or family history of stroke. Of note patient had what she describes as right eye blindness about 3 weeks ago, per records she had an MRI done under complete anesthesia on January 3, MRI revealed unremarkable orbital imaging but does show mild to moderate chronic small vessel ischemic changes.  Vision is still improving but not back to baseline per patient on assessment patient is awake alert oriented and moves all extremities equally denies impaired sensation in all extremities.  Patient denies any recent headaches, dizziness, syncope, nausea, vomiting or recent fall with head or arm trauma.  LSN: 09/03/2017 around 1030 tPA Given: No: The timeframe  Premorbid modified Rankin scale (mRS): 0   Past Medical History:  Diagnosis Date  . Diabetes mellitus without complication (HCC)    diet controlled  . Fibromyalgia   . GERD (gastroesophageal reflux disease)   . Hypercholesterolemia   . Hypertension   . Polyarthritis rheumatica (HCC)    RA  . Shingles   . Vision impairment    right eye    Past Surgical History:  Procedure Laterality Date  . ABDOMINAL HYSTERECTOMY    . [place] 1  1980's  . PARTIAL KNEE ARTHROPLASTY Left 01/17/2017   Procedure:  UNICOMPARTMENTAL LEFT KNEE;  Surgeon: Paralee Cancel, MD;  Location: [facility];  Service: Orthopedics;  Laterality: Left;  . RADIOLOGY WITH ANESTHESIA N/A 08/11/2017   Procedure: MRI OF BRAIN WITH AND WITHOUT CONSTRAST, AND OF THE ORBIT WITH AND WITHOUT CONTRAST;  Surgeon: Radiologist, Medication, MD;  Location: [facility];  Service: Radiology;  Laterality: N/A;  . RHINOPLASTY    . TOTAL KNEE ARTHROPLASTY Right 09/30/2015   Procedure: TOTAL RIGHT KNEE ARTHROPLASTY;  Surgeon: Paralee Cancel, MD;  Location: [facility];  Service: Orthopedics;  Laterality: Right;    Family History  Problem Relation Age of Onset  . Hypertension Mother   . Seizures Mother   . Hypertension Father   . Hypertension Sister   . Pancreatic cancer Other    Social History:  reports that she quit smoking about 5 years ago. Her smoking use included cigarettes. she has never used smokeless tobacco. She reports that she does not drink alcohol or use drugs.  Allergies:  Allergies  Allergen Reactions  . Codeine Hives  . Demerol [Meperidine] Hives  . Latex Dermatitis and Other (See Comments)    Blistering   . Morphine And Related Hives  . Sulfa Antibiotics Hives  . Tramadol Hives  . Vicodin [Hydrocodone-Acetaminophen] Itching    Elevated blood pressure. Tolerates low doses.    Medications:  Current Facility-Administered Medications:  .   stroke: mapping our early stages of recovery book, , Does not apply, Once, Lady Deutscher, MD .  diphenhydrAMINE (BENADRYL) tablet 25 mg,  25 mg, Oral, QHS, Sheehan, Wyatt Haste, MD .  latanoprost (XALATAN) 0.005 % ophthalmic solution 1 drop, 1 drop, Both Eyes, QHS, Sheehan, Wyatt Haste, MD .  prednisoLONE tablet 5 mg, 5 mg, Oral, QPM, Evangeline Gula, Wyatt Haste, MD .  predniSONE (DELTASONE) tablet 4 mg, 4 mg, Oral, QPM, Evangeline Gula, Wyatt Haste, MD .  Timolol Maleate PF 0.5 % SOLN 1 drop, 1 drop, Both Eyes, Daily, Evangeline Gula, Wyatt Haste, MD  Current Outpatient Medications:  .  Acetylcarn-Alpha Lipoic Acid 400-200  MG CAPS, Take 1 capsule by mouth 2 (two) times daily., Disp: , Rfl:  .  amLODipine (NORVASC) 5 MG tablet, Take 5 mg by mouth every evening. , Disp: , Rfl: 2 .  Biotin 1000 MCG tablet, Take 1,000 mcg by mouth every evening., Disp: , Rfl:  .  Calcium Carb-Cholecalciferol (CALCIUM 1000 + D PO), Take 1 tablet by mouth every evening., Disp: , Rfl:  .  Cholecalciferol (VITAMIN D) 2000 units CAPS, Take 4,000 Units by mouth daily., Disp: , Rfl:  .  Chromium Picolinate 200 MCG CAPS, Take 200 mcg by mouth daily., Disp: , Rfl:  .  Coenzyme Q10 (COQ-10) 100 MG CAPS, Take 100 mg by mouth daily., Disp: , Rfl:  .  diphenhydrAMINE (BENADRYL) 25 MG tablet, Take 25 mg by mouth at bedtime., Disp: , Rfl:  .  Lysine 500 MG CAPS, Take 500 mg by mouth every evening., Disp: , Rfl:  .  Omega-3 Fatty Acids (FISH OIL) 1200 MG CAPS, Take 1,200 mg 2 (two) times daily by mouth., Disp: , Rfl:  .  prednisoLONE 5 MG TABS tablet, Take 5 mg by mouth every evening. Take with 4 mg prednisone to add up to 27ms daily, Disp: , Rfl:  .  predniSONE (DELTASONE) 1 MG tablet, Take 4 mg by mouth every evening. Take with 516m, add up to 61m73mdaily, Disp: , Rfl:  .  TIMOPTIC OCUDOSE 0.5 % SOLN, Place 1 drop into both eyes daily. Preservative free only, Disp: , Rfl: 0 .  ZIOPTAN 0.0015 % SOLN, Place 1 drop into both eyes at bedtime., Disp: , Rfl: 3  ROS: As per history of present illness otherwise 13 point systems reviewed and are negative   Physical Examination: Blood pressure (!) 153/88, pulse 81, temperature 98.5 F (36.9 C), temperature source Oral, resp. rate 16, SpO2 92 %. HEENT-  Normocephalic, no lesions, without obvious abnormality.  Normal external eye and conjunctiva.   Cardiovascular- S1-S2 audible, pulses palpable throughout , noted to be very hypertensive Lungs-no increased work of breathing, saturations within normal limits Abdomen- All 4 quadrants palpated and nontender Extremities- Warm, dry and intact Musculoskeletal-no  joint tenderness, deformity or swelling Skin-warm and dry, no hyperpigmentation, vitiligo, or suspicious lesions  Neurological Examination Mental Status: Alert, oriented, thought content appropriate.  Speech fluent without evidence of aphasia.  Able to follow 3 step commands without difficulty. Cranial Nerves: II: Discs flat bilaterally; Visual fields grossly normal,  III,IV, VI: ptosis not present, extra-ocular motions intact bilaterally pupils equal, round, reactive to light and accommodation V,VII: smile symmetric, facial light touch sensation normal bilaterally VIII: Hearing intact to voice IX,X: uvula rises symmetrically XI: bilateral shoulder shrug XII: midline tongue extension Motor: No arm drift or leg drift Right : Upper extremity   4/5    Left:     Upper extremity   5/5  Lower extremity   5/5     Lower extremity   5/5 Tone and bulk:normal tone throughout; no atrophy noted Sensory: Pinprick  and light touch intact throughout, bilaterally Deep Tendon Reflexes: +1 reflex in left lower extremities and throughout Plantars: Right: downgoing   Left: downgoing Cerebellar: normal finger-to-nose, Gait: Deferred  NIHSS: 1  Results for orders placed or performed during the hospital encounter of 09/04/17 (from the past 48 hour(s))  Protime-INR     Status: None   Collection Time: 09/04/17 10:48 AM  Result Value Ref Range   Prothrombin Time 12.4 11.4 - 15.2 seconds   INR 0.93   APTT     Status: None   Collection Time: 09/04/17 10:48 AM  Result Value Ref Range   aPTT 28 24 - 36 seconds  CBC     Status: Abnormal   Collection Time: 09/04/17 10:48 AM  Result Value Ref Range   WBC 10.3 4.0 - 10.5 K/uL   RBC 5.24 (H) 3.87 - 5.11 MIL/uL   Hemoglobin 14.5 12.0 - 15.0 g/dL   HCT 44.0 36.0 - 46.0 %   MCV 84.0 78.0 - 100.0 fL   MCH 27.7 26.0 - 34.0 pg   MCHC 33.0 30.0 - 36.0 g/dL   RDW 15.7 (H) 11.5 - 15.5 %   Platelets 308 150 - 400 K/uL  Differential     Status: None   Collection  Time: 09/04/17 10:48 AM  Result Value Ref Range   Neutrophils Relative % 64 %   Neutro Abs 6.6 1.7 - 7.7 K/uL   Lymphocytes Relative 29 %   Lymphs Abs 3.0 0.7 - 4.0 K/uL   Monocytes Relative 6 %   Monocytes Absolute 0.6 0.1 - 1.0 K/uL   Eosinophils Relative 1 %   Eosinophils Absolute 0.1 0.0 - 0.7 K/uL   Basophils Relative 0 %   Basophils Absolute 0.0 0.0 - 0.1 K/uL  Comprehensive metabolic panel     Status: Abnormal   Collection Time: 09/04/17 10:48 AM  Result Value Ref Range   Sodium 138 135 - 145 mmol/L   Potassium 3.3 (L) 3.5 - 5.1 mmol/L   Chloride 102 101 - 111 mmol/L   CO2 22 22 - 32 mmol/L   Glucose, Bld 236 (H) 65 - 99 mg/dL   BUN 11 6 - 20 mg/dL   Creatinine, Ser 0.83 0.44 - 1.00 mg/dL   Calcium 9.3 8.9 - 10.3 mg/dL   Total Protein 7.0 6.5 - 8.1 g/dL   Albumin 4.2 3.5 - 5.0 g/dL   AST 18 15 - 41 U/L   ALT 19 14 - 54 U/L   Alkaline Phosphatase 84 38 - 126 U/L   Total Bilirubin 0.7 0.3 - 1.2 mg/dL   GFR calc non Af Amer >60 >60 mL/min   GFR calc Af Amer >60 >60 mL/min    Comment: (NOTE) The eGFR has been calculated using the CKD EPI equation. This calculation has not been validated in all clinical situations. eGFR's persistently <60 mL/min signify possible Chronic Kidney Disease.    Anion gap 14 5 - 15  I-stat troponin, ED     Status: None   Collection Time: 09/04/17 11:01 AM  Result Value Ref Range   Troponin i, poc 0.00 0.00 - 0.08 ng/mL   Comment 3            Comment: Due to the release kinetics of cTnI, a negative result within the first hours of the onset of symptoms does not rule out myocardial infarction with certainty. If myocardial infarction is still suspected, repeat the test at appropriate intervals.   I-Stat Chem 8, ED  Status: Abnormal   Collection Time: 09/04/17 11:02 AM  Result Value Ref Range   Sodium 141 135 - 145 mmol/L   Potassium 3.3 (L) 3.5 - 5.1 mmol/L   Chloride 102 101 - 111 mmol/L   BUN 12 6 - 20 mg/dL   Creatinine, Ser  0.70 0.44 - 1.00 mg/dL   Glucose, Bld 234 (H) 65 - 99 mg/dL   Calcium, Ion 1.14 (L) 1.15 - 1.40 mmol/L   TCO2 25 22 - 32 mmol/L   Hemoglobin 15.0 12.0 - 15.0 g/dL   HCT 44.0 36.0 - 46.0 %   Ct Head Wo Contrast 09/04/2017 IMPRESSION:  No acute finding by CT. Chronic small-vessel ischemic changes of the hemispheric white matter.    Assessment: 67 y.o. female with a past medical history of hypertension, dyslipidemia, diabetes, blood pressure who presents to the ED with complaints of right-sided numbness and weakness. 1. Eval for Stroke/TIA: Patient symptoms are currently improving, but will still continue to pursue a full stroke workup.  Initial CT head done on admission was negative, currently pending MRI, MRA of the head and neck which will have to occur under general anesthesia due to patient being severely claustrophobic.  And if unable to perform MRI/ MRA of the head and neck consider repeat CT of the head without contrast within 24-48 hours to evaluate for resolution and a CT of the head and neck for further evaluation.  Stroke Risk Factors - diabetes mellitus, hyperlipidemia and hypertension 2. Fibromyalgia 3. Hypokalemia 4. Hypertensive Urgency 5. Uncontrolled Diabetes Mellitus  Not a candidate for tPA - OSW Not a candidate for endovascular - exam not suggestive of LVO.  Plan: - HgbA1c, fasting lipid panel pending - Pending MRI, MRA head and neck under anesthesia.  If unable to perform MRI/MRA, consider repeat CT of the head without contrast within 24-48 hours and a CTA of the head and neck for further evaluation  - PT consult, OT consult, Speech consult - Echocardiogram - Prophylactic therapy-Antiplatelet med: Aspirin 325 - Risk factor modification - Telemetry monitoring - Frequent neuro checks -Allow permissive hypertension in the next 24-48 hours, patient's blood pressure currently in the low 200s. -Aggressive glycemic control -Replace electrolytes -Continue current  fibromyalgia med regimen  Patient seen and evaluated with Dr. Alver Fisher DNP Neuro-hospitalist Team [phone] 09/04/2017, 1:58 PM  Attending Neurohospitalist Addendum Patient seen and examined with APP/Resident. Agree with the history and physical as documented above. Agree with the plan as documented, which I helped formulate. I have independently reviewed the chart, obtained history, review of systems and examined the patient.I have personally reviewed pertinent head/neck/spine imaging (CT/MRI). CTH unremarkable for acute process MRI from 1/3 also unremarkable for acute process. Please feel free to call with any questions. --- Amie Portland, MD [facility] Pager: [phone]  If 7pm to 7am, please call on call as listed on AMION.

## 2017-09-05 ENCOUNTER — Other Ambulatory Visit (HOSPITAL_COMMUNITY): Payer: PPO

## 2017-09-05 ENCOUNTER — Other Ambulatory Visit: Payer: Self-pay

## 2017-09-05 DIAGNOSIS — I361 Nonrheumatic tricuspid (valve) insufficiency: Secondary | ICD-10-CM | POA: Diagnosis not present

## 2017-09-05 DIAGNOSIS — R200 Anesthesia of skin: Secondary | ICD-10-CM | POA: Diagnosis present

## 2017-09-05 DIAGNOSIS — R29810 Facial weakness: Secondary | ICD-10-CM | POA: Diagnosis present

## 2017-09-05 DIAGNOSIS — M353 Polymyalgia rheumatica: Secondary | ICD-10-CM | POA: Diagnosis present

## 2017-09-05 DIAGNOSIS — M6289 Other specified disorders of muscle: Secondary | ICD-10-CM

## 2017-09-05 DIAGNOSIS — E1165 Type 2 diabetes mellitus with hyperglycemia: Secondary | ICD-10-CM | POA: Diagnosis present

## 2017-09-05 DIAGNOSIS — H9209 Otalgia, unspecified ear: Secondary | ICD-10-CM | POA: Diagnosis present

## 2017-09-05 DIAGNOSIS — M797 Fibromyalgia: Secondary | ICD-10-CM

## 2017-09-05 DIAGNOSIS — I10 Essential (primary) hypertension: Secondary | ICD-10-CM | POA: Diagnosis present

## 2017-09-05 DIAGNOSIS — Z96653 Presence of artificial knee joint, bilateral: Secondary | ICD-10-CM | POA: Diagnosis present

## 2017-09-05 DIAGNOSIS — I63432 Cerebral infarction due to embolism of left posterior cerebral artery: Secondary | ICD-10-CM | POA: Diagnosis not present

## 2017-09-05 DIAGNOSIS — H47011 Ischemic optic neuropathy, right eye: Secondary | ICD-10-CM | POA: Diagnosis present

## 2017-09-05 DIAGNOSIS — R402414 Glasgow coma scale score 13-15, 24 hours or more after hospital admission: Secondary | ICD-10-CM | POA: Diagnosis not present

## 2017-09-05 DIAGNOSIS — F40240 Claustrophobia: Secondary | ICD-10-CM | POA: Diagnosis present

## 2017-09-05 DIAGNOSIS — E1159 Type 2 diabetes mellitus with other circulatory complications: Secondary | ICD-10-CM | POA: Diagnosis not present

## 2017-09-05 DIAGNOSIS — E1139 Type 2 diabetes mellitus with other diabetic ophthalmic complication: Secondary | ICD-10-CM | POA: Diagnosis present

## 2017-09-05 DIAGNOSIS — I071 Rheumatic tricuspid insufficiency: Secondary | ICD-10-CM | POA: Diagnosis present

## 2017-09-05 DIAGNOSIS — H538 Other visual disturbances: Secondary | ICD-10-CM | POA: Diagnosis present

## 2017-09-05 DIAGNOSIS — H5461 Unqualified visual loss, right eye, normal vision left eye: Secondary | ICD-10-CM | POA: Diagnosis present

## 2017-09-05 DIAGNOSIS — H9190 Unspecified hearing loss, unspecified ear: Secondary | ICD-10-CM | POA: Diagnosis present

## 2017-09-05 DIAGNOSIS — I160 Hypertensive urgency: Secondary | ICD-10-CM | POA: Diagnosis present

## 2017-09-05 DIAGNOSIS — I63532 Cerebral infarction due to unspecified occlusion or stenosis of left posterior cerebral artery: Secondary | ICD-10-CM | POA: Diagnosis present

## 2017-09-05 DIAGNOSIS — E876 Hypokalemia: Secondary | ICD-10-CM | POA: Diagnosis present

## 2017-09-05 DIAGNOSIS — I672 Cerebral atherosclerosis: Secondary | ICD-10-CM | POA: Diagnosis present

## 2017-09-05 DIAGNOSIS — R29702 NIHSS score 2: Secondary | ICD-10-CM | POA: Diagnosis present

## 2017-09-05 DIAGNOSIS — Z79899 Other long term (current) drug therapy: Secondary | ICD-10-CM | POA: Diagnosis not present

## 2017-09-05 DIAGNOSIS — E785 Hyperlipidemia, unspecified: Secondary | ICD-10-CM | POA: Diagnosis present

## 2017-09-05 DIAGNOSIS — G8191 Hemiplegia, unspecified affecting right dominant side: Secondary | ICD-10-CM | POA: Diagnosis present

## 2017-09-05 LAB — RAPID URINE DRUG SCREEN, HOSP PERFORMED
AMPHETAMINES: NOT DETECTED
Barbiturates: NOT DETECTED
Benzodiazepines: NOT DETECTED
Cocaine: NOT DETECTED
OPIATES: NOT DETECTED
Tetrahydrocannabinol: POSITIVE — AB

## 2017-09-05 LAB — GLUCOSE, CAPILLARY
GLUCOSE-CAPILLARY: 210 mg/dL — AB (ref 65–99)
GLUCOSE-CAPILLARY: 161 mg/dL — AB (ref 65–99)
GLUCOSE-CAPILLARY: 232 mg/dL — AB (ref 65–99)
Glucose-Capillary: 122 mg/dL — ABNORMAL HIGH (ref 65–99)

## 2017-09-05 LAB — LIPID PANEL
CHOL/HDL RATIO: 6 ratio
Cholesterol: 251 mg/dL — ABNORMAL HIGH (ref 0–200)
HDL: 42 mg/dL (ref >40)
LDL CALC: 143 mg/dL — AB (ref 0–99)
Triglycerides: 331 mg/dL — ABNORMAL HIGH (ref <150)
VLDL: 66 mg/dL — AB (ref 0–40)

## 2017-09-05 LAB — TSH: TSH: 0.649 u[IU]/mL (ref 0.350–4.500)

## 2017-09-05 MED ORDER — INSULIN ASPART 100 UNIT/ML ~~LOC~~ SOLN: 0.0000 [IU] | Freq: Three times a day (TID) | SUBCUTANEOUS | Status: DC

## 2017-09-05 MED ORDER — PREDNISOLONE 5 MG PO TABS
5.0000 mg | ORAL_TABLET | Freq: Every day | ORAL | Status: DC
  Administered 2017-09-06: 5 mg via ORAL
  Filled 2017-09-05 (×3): qty 1

## 2017-09-05 MED ORDER — PREDNISONE 1 MG PO TABS
4.0000 mg | ORAL_TABLET | Freq: Every day | ORAL | Status: DC
  Administered 2017-09-05 – 2017-09-06 (×2): 4 mg via ORAL
  Filled 2017-09-05 (×3): qty 4

## 2017-09-05 MED ORDER — INSULIN ASPART 100 UNIT/ML ~~LOC~~ SOLN: 0.0000 [IU] | Freq: Every day | SUBCUTANEOUS | Status: DC

## 2017-09-05 NOTE — Evaluation (Signed)
Occupational Therapy Evaluation Patient Details Name: Misty Morris MRN: [id number] DOB: August 19, 1950 Today's Date: 09/05/2017    History of Present Illness 67 y.o.femalewith medical history significant ofdiabetes type 2 diet-controlled, hypertension, hyperlipidemia, fibromyalgia, and polymyalgia rheumatica who presents with complaints of right-sided numbness. CT negative.   Clinical Impression   PTA, pt was living with alone and was independent. Pt currently presenting near baseline function and performing ADLs and functional mobility at supervision level. Pt with right visual field cut (accuring ~3 weeks ago) which impacts her balance and safety. Pt demonstrating poor visual scanning and bumping into objects on R side. Provided pt with vision handout and reviewed education on compensatory techniques including scanning and anchors. Pt would benefit form further acute OT to address visual deficits and safety during ADLs and IADLs. Recommend dc home with follow up to Neuro OP OT to address visual deficits and optimize safety and independence with ADLs, IADLs, and functional mobility.     Follow Up Recommendations  Supervision - Intermittent;Outpatient OT(Continue to follow up with eye MD)    Equipment Recommendations  None recommended by OT    Recommendations for Other Services PT consult     Precautions / Restrictions Precautions Precautions: Fall;Other (comment) Precaution Comments: R vision field cut      Mobility Bed Mobility Overal bed mobility: Independent                Transfers Overall transfer level: Independent                    Balance Overall balance assessment: Needs assistance Sitting-balance support: No upper extremity supported;Feet supported Sitting balance-Leahy Scale: Good     Standing balance support: No upper extremity supported;During functional activity Standing balance-Leahy Scale: Fair Standing balance comment: Pt will LOB after  head turn to R for visual scranning                           ADL either performed or assessed with clinical judgement   ADL Overall ADL's : Needs assistance/impaired                                       General ADL Comments: Pt performing near baseline level. Pt with right visual field cut which impacts her safety and occuaptionla performance. Pt running into objects and walls on right side. Also, loosing balance when performing head turns to R. Provided handout on vision deficits.     Vision Baseline Vision/History: Wears glasses Wears Glasses: Distance only Patient Visual Report: Other (comment)(R visual field cut started 3 weeks ago) Vision Assessment?: Yes Eye Alignment: Within Functional Limits Ocular Range of Motion: Within Functional Limits Alignment/Gaze Preference: Within Defined Limits Additional Comments: Pt reporting R visual field cut begining 3 weeks ago and has followed up with eye MD     Perception     Praxis      Pertinent Vitals/Pain Pain Assessment: No/denies pain     Hand Dominance Right   Extremity/Trunk Assessment Upper Extremity Assessment Upper Extremity Assessment: Overall WFL for tasks assessed;RUE deficits/detail RUE Deficits / Details: Numbness at R shuolder and chest   Lower Extremity Assessment Lower Extremity Assessment: Overall WFL for tasks assessed   Cervical / Trunk Assessment Cervical / Trunk Assessment: Normal   Communication Communication Communication: No difficulties   Cognition Arousal/Alertness: Awake/alert Behavior During Therapy: WFL for tasks  assessed/performed Overall Cognitive Status: Within Functional Limits for tasks assessed                                     General Comments  Provided handout on visual field deficits and safety with ADLs.     Exercises     Shoulder Instructions      Home Living Family/patient expects to be discharged to:: Private  residence Living Arrangements: Alone Available Help at Discharge: Friend(s);Available PRN/intermittently Type of Home: House Home Access: Stairs to enter [employer] of Steps: 2 Entrance Stairs-Rails: Left Home Layout: One level     Bathroom Shower/Tub: Teacher, early years/pre: Standard     Home Equipment: Environmental consultant - 2 wheels;Bedside commode;Cane - single point;Tub bench;Grab bars - tub/shower;Shower seat          Prior Functioning/Environment Level of Independence: Independent        Comments: ADLs, IADLs, and driving        OT Problem List: Impaired balance (sitting and/or standing);Impaired vision/perception;Decreased safety awareness      OT Treatment/Interventions: Self-care/ADL training;Therapeutic exercise;Energy conservation;DME and/or AE instruction;Therapeutic activities;Patient/family education    OT Goals(Current goals can be found in the care plan section) Acute Rehab OT Goals Patient Stated Goal: Go home today OT Goal Formulation: With patient Time For Goal Achievement: 09/19/17 Potential to Achieve Goals: Good  OT Frequency: Min 2X/week   Barriers to D/C: Decreased caregiver support  Lives alone       Co-evaluation              AM-PAC PT "6 Clicks" Daily Activity     Outcome Measure Help from another person eating meals?: None Help from another person taking care of personal grooming?: None Help from another person toileting, which includes using toliet, bedpan, or urinal?: None Help from another person bathing (including washing, rinsing, drying)?: None Help from another person to put on and taking off regular upper body clothing?: None Help from another person to put on and taking off regular lower body clothing?: None 6 Click Score: 24   End of Session Equipment Utilized During Treatment: Gait belt Nurse Communication: Mobility status  Activity Tolerance: Patient tolerated treatment well Patient left: in bed;with  call bell/phone within reach;with family/visitor present  OT Visit Diagnosis: Unsteadiness on feet (R26.81);Low vision, both eyes (H54.2)                Time: 6294-7654 OT Time Calculation (min): 22 min Charges:  OT General Charges $OT Visit: 1 Visit OT Evaluation $OT Eval Moderate Complexity: 1 Mod G-Codes:     [place] MSOT, OTR/L Acute Rehab Pager: [phone] Office: [place] 09/05/2017, 12:41 PM

## 2017-09-05 NOTE — Progress Notes (Signed)
PROGRESS NOTE    Misty Morris  ONG:[id number] DOB: [birth year] DOA: 09/04/2017 PCP: Misty Morris, Misty Sa, MD   Outpatient Specialists:    Brief Narrative:  Misty Morris is a 67 y.o. female with medical history significant of diabetes type 2 diet-controlled, hypertension, hyperlipidemia, fibromyalgia, and polymyalgia rheumatica who presents with complaints of right-sided numbness.  Patient awoke at 4 AM and was normal as best she could tell but she was somewhat drowsy.  When she awoke later in the morning she had some numbness and weakness of her right arm and hand and some right leg weakness.  She had a slight left facial droop on presentation to the emergency department which appears to have resolved.  She denies any noncompliance with medications.  She took her medications this morning.  She is followed primarily by Misty Morris for her diabetes and hypertension and by Misty Morris from rheumatology for her polymyalgia rheumatica and fibromyalgia.  She has not had any changes in her medications.  Presentation to the emergency department her blood pressure is elevated.  Patient reports never having had symptoms like this before.  She denies any vision changes.  Had no fever, vomiting, diarrhea, or recent illness.  She has no personal or family history of stroke.  She does have several risk factors as noted above.  NIH stroke scale based on evaluation is 2.  She is a very good historian.  The patient was seen in the emergency department by Misty Morris from neurology who feels that she should be admitted and she will need an MRI with full sedation.  She had an MRI earlier this month and needed to have anesthesia sedation was unable to undergo the MRI even with benzodiazepine treatment.  This was done for visual blindness appears to be improving.     Assessment & Plan:   Principal Problem:   Stroke [facility]) Active Problems:   Hypertension complicating diabetes ([place])   Type 2 diabetes mellitus  with ophthalmic complication (HCC)   Polymyalgia rheumatica (HCC)   Fibromyalgia   Stroke like symptoms -resolving per patient -telemetry -MRI of the head with full sedation Morris her difficulty with MRIs in the past- unfortunately not made NPO after midnihgt -aspirin/statin started but suspect patient will refuse LDL: 143  Hypertension complicating diabetes -Hold amlodipine permissive hypertension for now.  Type 2 diabetes mellitus with ophthalmologic complication:  -refusing all insulin/PO medications-- states she controls with diet and herbs -long discussion with patient regarding her risk factors -will discuss with PCP metformin -last HgbA1c: 8.7  Polymyalgia rheumatica:  -Continue home medication regimen (combo prednisone/prednisolone?) -Patient sees Misty Morris.  Fibromyalgia:  -Continue home treatment regimen.  Follow-up with Misty Morris.  Hypokalemia -replete and recheck     DVT prophylaxis:  Lovenox   Code Status: Full Code   Family Communication:   Disposition Plan:     Consultants:   neuro     Subjective: Refusing SSI or medications for LDL and Blood sugars  Objective: Vitals:   09/04/17 2218 09/05/17 0143 09/05/17 0500 09/05/17 0958  BP: (!) 161/83 (!) 151/89 (!) 147/87 (!) 148/112  Pulse: 92 88 82 94  Resp: 18 18 17 16   Temp: 98.6 F (37 C) 98.2 F (36.8 C) 98.5 F (36.9 C) 99.2 F (37.3 C)  TempSrc: Oral Oral Oral Oral  SpO2: 97% 98% 98% 96%    Intake/Output Summary (Last 24 hours) at 09/05/2017 1035 Last data filed at 09/04/2017 1800 Gross per 24 hour  Intake 240 ml  Output -  Net 240 ml   There were no vitals filed for this visit.  Examination:  General exam: Appears calm and comfortable  Respiratory system: Clear to auscultation. Respiratory effort normal. Cardiovascular system: S1 & S2 heard, RRR. No JVD, murmurs, rubs, gallops or clicks. No pedal edema. Gastrointestinal system: Abdomen is nondistended, soft  and nontender. No organomegaly or masses felt. Normal bowel sounds heard. Central nervous system: Alert and oriented. No focal neurological deficits. Extremities: Symmetric 5 x 5 power. Skin: No rashes, lesions or ulcers Psychiatry: Judgement and insight appear normal. Mood & affect appropriate.     Data Reviewed: I have personally reviewed following labs and imaging studies  CBC: Recent Labs  Lab 09/04/17 1048 09/04/17 1102  WBC 10.3  --   NEUTROABS 6.6  --   HGB 14.5 15.0  HCT 44.0 44.0  MCV 84.0  --   PLT 308  --    Basic Metabolic Panel: Recent Labs  Lab 09/04/17 1048 09/04/17 1102  NA 138 141  K 3.3* 3.3*  CL 102 102  CO2 22  --   GLUCOSE 236* 234*  BUN 11 12  CREATININE 0.83 0.70  CALCIUM 9.3  --    GFR: CrCl cannot be calculated (Unknown ideal weight.). Liver Function Tests: Recent Labs  Lab 09/04/17 1048  AST 18  ALT 19  ALKPHOS 84  BILITOT 0.7  PROT 7.0  ALBUMIN 4.2   No results for input(s): LIPASE, AMYLASE in the last 168 hours. No results for input(s): AMMONIA in the last 168 hours. Coagulation Profile: Recent Labs  Lab 09/04/17 1048  INR 0.93   Cardiac Enzymes: No results for input(s): CKTOTAL, CKMB, CKMBINDEX, TROPONINI in the last 168 hours. BNP (last 3 results) No results for input(s): PROBNP in the last 8760 hours. HbA1C: No results for input(s): HGBA1C in the last 72 hours. CBG: Recent Labs  Lab 09/04/17 2150 09/05/17 0602  GLUCAP 201* 210*   Lipid Profile: Recent Labs    09/05/17 0458  CHOL 251*  HDL 42  LDLCALC 143*  TRIG 331*  CHOLHDL 6.0   Thyroid Function Tests: No results for input(s): TSH, T4TOTAL, FREET4, T3FREE, THYROIDAB in the last 72 hours. Anemia Panel: No results for input(s): VITAMINB12, FOLATE, FERRITIN, TIBC, IRON, RETICCTPCT in the last 72 hours. Urine analysis:    Component Value Date/Time   COLORURINE YELLOW 09/23/2015 1110   APPEARANCEUR CLEAR 09/23/2015 1110   LABSPEC 1.025 09/23/2015 1110    PHURINE 6.0 09/23/2015 1110   GLUCOSEU 100 (A) 09/23/2015 1110   HGBUR NEGATIVE 09/23/2015 1110   BILIRUBINUR NEGATIVE 09/23/2015 1110   KETONESUR NEGATIVE 09/23/2015 1110   PROTEINUR NEGATIVE 09/23/2015 1110   UROBILINOGEN 0.2 06/04/2011 1034   NITRITE NEGATIVE 09/23/2015 1110   LEUKOCYTESUR NEGATIVE 09/23/2015 1110     )No results found for this or any previous visit (from the past 240 hour(s)).    Anti-infectives (From admission, onward)   None       Radiology Studies: Ct Head Wo Contrast  Result Date: 09/04/2017 CLINICAL DATA:  Numbness of the right arm and leg beginning last night. EXAM: CT HEAD WITHOUT CONTRAST TECHNIQUE: Contiguous axial images were obtained from the base of the skull through the vertex without intravenous contrast. COMPARISON:  MRI 08/11/2017 FINDINGS: Brain: The there chronic small-vessel ischemic changes of the hemispheric white matter. No sign of acute infarction. No cortical or large vessel territory infarction. No mass lesion, hemorrhage, hydrocephalus or extra-axial collection. Vascular: There is atherosclerotic calcification of  the major vessels at the base of the brain. Skull: Calvarium is normal. Sinuses/Orbits: Clear/normal. Other: Previous maxillary surgery. IMPRESSION: No acute finding by CT. Chronic small-vessel ischemic changes of the hemispheric white matter. Electronically Signed   By: [name] M.D.   On: 09/04/2017 12:27        Scheduled Meds: . aspirin  325 mg Oral Daily  . atorvastatin  80 mg Oral q1800  . diphenhydrAMINE  25 mg Oral QHS  . latanoprost  1 drop Both Eyes QHS  . prednisoLONE  5 mg Oral QPC supper  . predniSONE  4 mg Oral Q breakfast  . timolol  1 drop Both Eyes Daily   Continuous Infusions:   LOS: 0 days    Time spent: 35 min    Geradine Girt, DO [facility] Pager [phone]  If 7PM-7AM, please contact night-coverage [email or web address] Password [facility] 09/05/2017, 10:35 AM

## 2017-09-05 NOTE — Progress Notes (Signed)
NEUROHOSPITALISTS STROKE TEAM - DAILY PROGRESS NOTE   ADMISSION HISTORY:  Misty Morris is an 67 y.o. female with a past medical history of type 2 diabetes mellitus hypertension, dyslipidemia, fibromyalgia rheumatica who presented to the ER this morning with right arm numbness.  Patient was in her usual state of health yesterday and went to bed around 1030 bulk of all 4 AM this morning went to the bathroom, did not notice any abnormalities when she went to the bathroom.  Upon waking up this morning she noticed right arm and right leg numbness that seemed to get worse throughout the morning with some weakness in the right arm as well as unsteady gait.  She denies ever having such symptoms, personal history of stroke or family history of stroke.  Of note patient had what she describes as right eye blindness about 3 weeks ago, per records she had an MRI done under complete anesthesia on January 3, MRI revealed unremarkable orbital imaging but does show mild to moderate chronic small vessel ischemic changes.  Vision is still improving but not back to baseline per patient on assessment patient is awake alert oriented and moves all extremities equally denies impaired sensation in all extremities.   Patient denies any recent headaches, dizziness, syncope, nausea, vomiting or recent fall with head or arm trauma.  LSN: 09/03/2017 around 1030 tPA Given: No: The timeframe Premorbid modified Rankin scale (mRS): 0 NIHSS: 1  SUBJECTIVE (INTERVAL HISTORY) No family is at the bedside. Patient is found laying in bed in NAD. Overall she feels her condition is gradually improving. Voices no new complaints. No new events reported overnight. Patient states she had her first episode of Optic Neuritis in her late twenties and has not reoccurred until now.    OBJECTIVE Lab Results: CBC:  Recent Labs  Lab 09/04/17 1048 09/04/17 1102  WBC 10.3  --   HGB 14.5 15.0    HCT 44.0 44.0  MCV 84.0  --   PLT 308  --    BMP: Recent Labs  Lab 09/04/17 1048 09/04/17 1102  NA 138 141  K 3.3* 3.3*  CL 102 102  CO2 22  --   GLUCOSE 236* 234*  BUN 11 12  CREATININE 0.83 0.70  CALCIUM 9.3  --    Liver Function Tests:  Recent Labs  Lab 09/04/17 1048  AST 18  ALT 19  ALKPHOS 84  BILITOT 0.7  PROT 7.0  ALBUMIN 4.2   Thyroid Function Studies:  Recent Labs    09/05/17 0927  TSH 0.649   Coagulation Studies:  Recent Labs    09/04/17 1048  APTT 28  INR 0.93   PHYSICAL EXAM Temp:  [98.2 F (36.8 C)-99.2 F (37.3 C)] 98.9 F (37.2 C) (01/28 1426) Pulse Rate:  [82-109] 88 (01/28 1426) Resp:  [14-18] 18 (01/28 1426) BP: (136-161)/(76-112) 152/77 (01/28 1426) SpO2:  [94 %-99 %] 94 % (01/28 1426) General - Well nourished, well developed, in no apparent distress HEENT-  Normocephalic,  Cardiovascular - Regular rate and rhythm  Respiratory - Lungs clear bilaterally. No wheezing. Abdomen - soft and non-tender, BS normal Extremities- no edema or cyanosis Mental Status: Alert, oriented, thought content appropriate.  Speech fluent without evidence of aphasia.  Able to follow 3 step commands without difficulty. Cranial Nerves: II: Visual field cut on Right  III,IV, VI: ptosis not present, extra-ocular motions intact bilaterally pupils equal, round, reactive to light and accommodation V,VII: smile symmetric, facial light touch sensation normal bilaterally  VIII: Hearing intact to voice IX,X: uvula rises symmetrically XI: bilateral shoulder shrug XII: midline tongue extension Motor: No arm drift or leg drift Right : Upper extremity   4+/5    Left:     Upper extremity   5/5  Lower extremity   5/5     Lower extremity   5/5 Tone and bulk:normal tone throughout; no atrophy noted Sensory: Pinprick and light touch intact throughout, bilaterally Deep Tendon Reflexes: +1 reflex in left lower extremities and throughout Plantars: Right:  downgoing   Left: downgoing Cerebellar: normal finger-to-nose, Gait: Deferred  IMAGING: I have personally reviewed the radiological images below and agree with the radiology interpretations. Ct Head Wo Contrast Result Date: 09/04/2017 IMPRESSION: No acute finding by CT. Chronic small-vessel ischemic changes of the hemispheric white matter. Electronically Signed   By: [name] M.D.   On: 09/04/2017 12:27   Echocardiogram:                                              PENDING MRI/MRA Head                                                  PENDING     IMPRESSION: [name] is a 67 y.o. female with PMH of hypertension, dyslipidemia, diabetes, blood pressure who presents to the ED with complaints of right-sided numbness and weakness. Initial CT head done on admission was negative, pending MRI, MRA of the head and neck which will occur under general anesthesia on Tuesday morning.  Not a candidate for tPA - OSW Not a candidate for endovascular - exam not suggestive of LVO.  Suspected Etiology:  athero vs cardioembolic source  Resultant Symptoms: right-sided numbness and weakness Stroke Risk Factors: diabetes mellitus, hyperlipidemia and hypertension Other Stroke Risk Factors: Advanced age  Outstanding Stroke Work-up Studies:     Echocardiogram:                                                     MRI/MRA Head  09/05/2017 ASSESSMENT:   Neuro exam stable. Continues to complain of Right sided numbness but much improved from yesterday's exam. Continues to complain of Right sided vision loss. MRI/MRA pending for tomorrow morning. Imaging/Labs and POC reviewed with patient  PLAN  09/05/2017: Continue Aspirin/ Statin Frequent neuro checks Telemetry monitoring PT/OT/SLP Consult Case Management /MSW Ongoing aggressive stroke risk factor management Patient counseled to be compliant with her antithrombotic medications Patient counseled on Lifestyle modifications including, Diet,  Exercise, and Stress Follow up with Dr [facility] in 6 weeks   Fibromyalgia Continue current fibromyalgia med regimen  HYPERTENSION: Stable Permissive hypertension (OK if <220/120) for 24-48 hours post stroke and then gradually normalized within 5-7 days. Long term BP goal normotensive. May slowly restart home B/P medications after 48 hours Home Meds: Norvasc  HYPERLIPIDEMIA:    Component Value Date/Time   CHOL 251 (H) 09/05/2017 0458   TRIG 331 (H) 09/05/2017 0458   HDL 42 09/05/2017 0458   CHOLHDL 6.0 09/05/2017 0458   VLDL 66 (  H) 09/05/2017 0458   LDLCALC 143 (H) 09/05/2017 0458  Home Meds:  NONE LDL  goal < 70 Started on  Lipitor to 80 mg daily Continue statin at discharge  DIABETES: Lab Results  Component Value Date   HGBA1C 8.7 (H) 08/11/2017  HgbA1c goal < 7.0 Continue CBG monitoring and SSI to maintain glucose 140-180 mg/dl DM education   Other Active Problems: Principal Problem:   Stroke [facility]) Active Problems:   Hypertension complicating diabetes ([place])   Type 2 diabetes mellitus with ophthalmic complication (HCC)   Polymyalgia rheumatica ([place])   Fibromyalgia    Hospital day # 0 VTE prophylaxis: SCD's Diet : Fall precautions Fall precautions Diet Carb Modified Fluid consistency: Thin; Room service appropriate? Yes Diet NPO time specified   FAMILY UPDATES: No family at bedside  TEAM UPDATES: Geradine Girt, DO STATUS:  DNR  Palliative care DNI/DNR or comfort measures   Prior Home Stroke Medications:  No antithrombotic  Discharge Stroke Meds:  Please discharge patient on aspirin 325 mg daily   Disposition: 01-Home or Self Care Therapy Recs:               PENDING Follow Up:  Follow-up Information    Sater, Nanine Means, MD. Schedule an appointment as soon as possible for a visit in 4 week(s).   Specialty:  Neurology Contact information: [address] [place] [address] [phone]          Alroy Dust, L.Marlou Sa, MD  -PCP Follow up in 1-2 weeks      Assessment & plan discussed with with attending physician and they are in agreement.    Renie Ora Stroke Neurology Team 09/05/2017 2:36 PM  ATTENDING NOTE: I reviewed above note and agree with the assessment and plan. I have made any additions or clarifications directly to the above note. Pt was seen and examined.   [age]-year-old female with history of diabetes, hypertension, hyperlipidemia, polyarthritis rheumatica admitted for right arm and leg numbness as well as mild weakness.    She stated that at her mid 23s she had right sided vision difficulty with patchy vision loss, concerning for multiple sclerosis.  However, over the years, right vision getting improved some and no more recurrence.  About 3 weeks ago, patient had right side vision worsening to near blind, went to see her ophthalmologist, and a fundi exam concerning for papilledema on the right, had MRI brain and orbital under general anesthesia which were both negative.  She was diagnosed with right ischemic optic neuropathy.  This time, MRI/MRA head and neck pending.  2D echo pending, A1c 8.7, and LDL 143.  Put patient on aspirin Lipitor for stroke prevention.  Patient does have several stroke risk factors including uncontrolled diabetes, hyperlipidemia, hypertension.  However, patient right vision decrease in mid 20s and 3 weeks ago, as well as right arm leg numbness along with bilateral white matter T2 hyperintensities, putting multiple sclerosis on differential diagnosis.  Recommend patient follow-up with Dr. Felecia Shelling on discharge for MS evaluation.  Rosalin Hawking, MD PhD Stroke Neurology 09/05/2017 5:49 PM   To contact Stroke Continuity provider, please refer to [email or web address] After hours, contact General Neurology

## 2017-09-05 NOTE — Evaluation (Signed)
Physical Therapy Evaluation and Discharge Patient Details Name: Misty Morris MRN: [id number] DOB: [birth year] Today's Date: 09/05/2017   History of Present Illness  67 y.o.femalewith medical history significant ofdiabetes type 2 diet-controlled, hypertension, hyperlipidemia, fibromyalgia, and polymyalgia rheumatica who presents with complaints of right-sided numbness. CT negative. MRI planned for tomorrow 1/29    Clinical Impression  Pt admitted with above diagnosis. Pt currently with functional limitations due to the deficits listed below (see PT Problem List). PTA, pt was living alone in 1 story home with 2 stairs to enter and independent with all mobility, ADLs and IADLs. Upon evaluation pt presents with very mild weakness that she reports is close to baseline, as well as visual field deficits that have changed over last several weeks that impact her balance during dynamic mobility. Currently supervision level for OOB mobility. Session focused on balance training with walking, pt safely ambulating halls with RW for increased safety. Pt reports no further questions or concerns for PT is agreeable for following up with OP neuro PT once medically cleared for d/c.  Pt will benefit from skilled PT to increase their independence and safety with mobility to allow discharge to the venue listed below.      Follow Up Recommendations Outpatient PT(Neuro PT)    Equipment Recommendations  None recommended by PT    Recommendations for Other Services       Precautions / Restrictions Precautions Precautions: Fall;Other (comment) Precaution Comments: R vision field cut      Mobility  Bed Mobility Overal bed mobility: Independent                Transfers Overall transfer level: Independent                  Ambulation/Gait Ambulation/Gait assistance: Supervision;Min guard Ambulation Distance (Feet): 500 Feet Assistive device: None;Rolling walker (2 wheeled) Gait  Pattern/deviations: WFL(Within Functional Limits);Staggering left Gait velocity: decreased   General Gait Details: patient ambulates with normal mechanics however w/ path deviation to the left at times without LOB. pt more independent with RW, education for sacnning room and identifying objects before hitting in them due to visual field deficts. pt progresed to mod I by end of session with RW.   Stairs            Wheelchair Mobility    Modified Rankin (Stroke Patients Only)       Balance Overall balance assessment: Needs assistance Sitting-balance support: No upper extremity supported;Feet supported Sitting balance-Leahy Scale: Good     Standing balance support: No upper extremity supported;During functional activity Standing balance-Leahy Scale: Fair Standing balance comment: Pt will LOB after head turn to R for visual scranning                             Pertinent Vitals/Pain Pain Assessment: No/denies pain    Home Living Family/patient expects to be discharged to:: Private residence Living Arrangements: Alone Available Help at Discharge: Friend(s);Available PRN/intermittently Type of Home: House Home Access: Stairs to enter Entrance Stairs-Rails: Left Entrance Stairs-Number of Steps: 2 Home Layout: One level Home Equipment: Walker - 2 wheels;Bedside commode;Cane - single point;Tub bench;Grab bars - tub/shower;Shower seat      Prior Function Level of Independence: Independent         Comments: ADLs, IADLs, and driving     Hand Dominance   Dominant Hand: Right    Extremity/Trunk Assessment   Upper Extremity Assessment Upper Extremity Assessment: Overall  WFL for tasks assessed RUE Deficits / Details: Numbness at R shuolder and chest    Lower Extremity Assessment Lower Extremity Assessment: Overall WFL for tasks assessed(strength 4/5 BLE. intact to light touch )    Cervical / Trunk Assessment Cervical / Trunk Assessment: Normal   Communication   Communication: No difficulties  Cognition Arousal/Alertness: Awake/alert Behavior During Therapy: WFL for tasks assessed/performed Overall Cognitive Status: Within Functional Limits for tasks assessed                                        General Comments General comments (skin integrity, edema, etc.): Discussed safety concerns with driving, pt reports she does not feel safe and will refrain for time being.     Exercises     Assessment/Plan    PT Assessment All further PT needs can be met in the next venue of care  PT Problem List Decreased strength;Decreased balance;Impaired sensation;Decreased mobility       PT Treatment Interventions      PT Goals (Current goals can be found in the Care Plan section)  Acute Rehab PT Goals Patient Stated Goal: go home with OP PT PT Goal Formulation: With patient Time For Goal Achievement: 09/08/17 Potential to Achieve Goals: Good    Frequency     Barriers to discharge        Co-evaluation               AM-PAC PT "6 Clicks" Daily Activity  Outcome Measure Difficulty turning over in bed (including adjusting bedclothes, sheets and blankets)?: None Difficulty moving from lying on back to sitting on the side of the bed? : None Difficulty sitting down on and standing up from a chair with arms (e.g., wheelchair, bedside commode, etc,.)?: A Little Help needed moving to and from a bed to chair (including a wheelchair)?: A Little Help needed walking in hospital room?: A Little Help needed climbing 3-5 steps with a railing? : A Little 6 Click Score: 20    End of Session Equipment Utilized During Treatment: Gait belt Activity Tolerance: Patient tolerated treatment well Patient left: in bed Nurse Communication: Mobility status PT Visit Diagnosis: Unsteadiness on feet (R26.81);Other abnormalities of gait and mobility (R26.89);Other symptoms and signs involving the nervous system (R29.898)    Time:  1834-3735 PT Time Calculation (min) (ACUTE ONLY): 32 min   Charges:   PT Evaluation $PT Eval Moderate Complexity: 1 Mod PT Treatments $Gait Training: 8-22 mins   PT G Codes:        Reinaldo Berber, PT, DPT Acute Rehab Services Pager: [phone]    Reinaldo Berber 09/05/2017, 1:26 PM

## 2017-09-05 NOTE — Progress Notes (Signed)
Late entry for 09/04/17 @22 :20. Patient upset because she takes her prednisone and prednisolone at 6pm and as she states, "It is late" While administering medication I had to call and speak to Rhodhiss in pharmacy because the medications were not scanning properly. He made some changes and she took her medication. She refused the eye drops ordered for 22:00, stating, "I'm not taking those until I talk to the dr." As we were talking, she informed me she was going to be  "in bad shape, because I haven't had my medications." While speaking with here, she informed me she doesn't take many prescriptions, but vitamins and homeopathic medications. She told me "I got them when I was at [facility] [employer]." I inquired of the patient if she had informed the pharmacy tech who spoke to her earlier in the day of these medications.. She replied, "I really can't remember"  I asked her if she had a list of medications, and she did not. I gave her paper to write down what she takes. She was frustrated, stating"I should go home" "I will get my own medicine and take it myself" I then explained the policy of home medications. She verbalized understanding. She denied any pain, just a numbness in her right arm between her elbow and shoulder. She stated, "It's much better." Stroke book given to her, along with emotional support. She is steady on her feet. Safety maintained.

## 2017-09-05 NOTE — Care Management Obs Status (Signed)
[address] NOTIFICATION   Patient Details  Name: Misty Morris MRN: [id number] Date of Birth: 17-Jun-1951   Medicare Observation Status Notification Given:  Yes    Pollie Friar, RN 09/05/2017, 4:41 PM

## 2017-09-05 NOTE — Progress Notes (Signed)
SLP Cancellation Note  Patient Details Name: Misty Morris MRN: [id number] DOB: [birth year]   Cancelled treatment:       Reason Eval/Treat Not Completed: SLP screened, no needs identified, will sign off.  Celia B. Quentin Ore [facility], CCC-SLP Speech Language Pathologist [phone]  Misty Morris 09/05/2017, 9:48 AM

## 2017-09-05 NOTE — Progress Notes (Signed)
Received report from [place]. Pt alert and oriented X 4, here with complaints of numbness to right arm and shoulder. 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 She was observed and documented as independent. RN reports to me that patient has been noncompliant with medications, and has been treating her own self with homeopathic medications.. Currently patient sitting in chair. Will monitor.

## 2017-09-05 NOTE — Progress Notes (Signed)
Patient requesting her blood pressure medication. States she takes norvasc 5 mg Q evening. B/P is 151/89. Paged NP Blount and gave her this information.

## 2017-09-05 NOTE — Progress Notes (Signed)
Addendum to progress note 09/04/17 @19 :30. RN giving me report said she "was through with Q 2hr VS and neuro checks, and that she was on a carb modified diet.

## 2017-09-06 ENCOUNTER — Encounter (HOSPITAL_COMMUNITY)
Admission: EM | Disposition: A | Discharge status: Home / Self Care | Payer: Self-pay | Source: Home / Self Care | Attending: Internal Medicine

## 2017-09-06 ENCOUNTER — Inpatient Hospital Stay (HOSPITAL_COMMUNITY): Payer: PPO | Admitting: Certified Registered Nurse Anesthetist

## 2017-09-06 ENCOUNTER — Inpatient Hospital Stay (HOSPITAL_COMMUNITY): Payer: PPO

## 2017-09-06 ENCOUNTER — Other Ambulatory Visit (HOSPITAL_COMMUNITY): Payer: PPO

## 2017-09-06 DIAGNOSIS — E785 Hyperlipidemia, unspecified: Secondary | ICD-10-CM

## 2017-09-06 DIAGNOSIS — H47011 Ischemic optic neuropathy, right eye: Secondary | ICD-10-CM

## 2017-09-06 HISTORY — PX: RADIOLOGY WITH ANESTHESIA: SHX6223

## 2017-09-06 LAB — CBC
HCT: 39.5 % (ref 36.0–46.0)
Hemoglobin: 12.8 g/dL (ref 12.0–15.0)
MCH: 27.3 pg (ref 26.0–34.0)
MCHC: 32.4 g/dL (ref 30.0–36.0)
MCV: 84.2 fL (ref 78.0–100.0)
PLATELETS: 293 10*3/uL (ref 150–400)
RBC: 4.69 MIL/uL (ref 3.87–5.11)
RDW: 15.8 % — AB (ref 11.5–15.5)
WBC: 9.1 10*3/uL (ref 4.0–10.5)

## 2017-09-06 LAB — BASIC METABOLIC PANEL
ANION GAP: 10 (ref 5–15)
BUN: 11 mg/dL (ref 6–20)
CALCIUM: 9.1 mg/dL (ref 8.9–10.3)
CO2: 24 mmol/L (ref 22–32)
Chloride: 106 mmol/L (ref 101–111)
Creatinine, Ser: 0.74 mg/dL (ref 0.44–1.00)
GFR calc Af Amer: >60 mL/min (ref >60)
GLUCOSE: 204 mg/dL — AB (ref 65–99)
POTASSIUM: 3.6 mmol/L (ref 3.5–5.1)
SODIUM: 140 mmol/L (ref 135–145)

## 2017-09-06 LAB — GLUCOSE, CAPILLARY
GLUCOSE-CAPILLARY: 160 mg/dL — AB (ref 65–99)
GLUCOSE-CAPILLARY: 100 mg/dL — AB (ref 65–99)
GLUCOSE-CAPILLARY: 191 mg/dL — AB (ref 65–99)
GLUCOSE-CAPILLARY: 390 mg/dL — AB (ref 65–99)
Glucose-Capillary: 141 mg/dL — ABNORMAL HIGH (ref 65–99)

## 2017-09-06 SURGERY — MRI WITH ANESTHESIA
Anesthesia: General

## 2017-09-06 MED ORDER — EPHEDRINE SULFATE-NACL 50-0.9 MG/10ML-% IV SOSY
PREFILLED_SYRINGE | INTRAVENOUS | Status: DC | PRN
  Administered 2017-09-06 (×2): 5 mg via INTRAVENOUS

## 2017-09-06 MED ORDER — OXYCODONE HCL 5 MG/5ML PO SOLN: 5.0000 mg | Freq: Once | ORAL | Status: DC | PRN

## 2017-09-06 MED ORDER — DEXTROSE 5 % IV SOLN
INTRAVENOUS | Status: DC | PRN
  Administered 2017-09-06: 25 ug/min via INTRAVENOUS

## 2017-09-06 MED ORDER — OXYCODONE HCL 5 MG PO TABS: 5.0000 mg | ORAL_TABLET | Freq: Once | ORAL | Status: DC | PRN

## 2017-09-06 MED ORDER — PROPOFOL 10 MG/ML IV BOLUS
INTRAVENOUS | Status: DC | PRN
  Administered 2017-09-06: 100 mg via INTRAVENOUS
  Administered 2017-09-06: 80 mg via INTRAVENOUS
  Administered 2017-09-06: 120 mg via INTRAVENOUS

## 2017-09-06 MED ORDER — ACETAMINOPHEN 160 MG/5ML PO SOLN: 325.0000 mg | ORAL | Status: DC | PRN

## 2017-09-06 MED ORDER — CLOPIDOGREL BISULFATE 75 MG PO TABS
75.0000 mg | ORAL_TABLET | Freq: Every day | ORAL | Status: DC
  Filled 2017-09-06: qty 1

## 2017-09-06 MED ORDER — PHENYLEPHRINE 40 MCG/ML (10ML) SYRINGE FOR IV PUSH (FOR BLOOD PRESSURE SUPPORT)
PREFILLED_SYRINGE | INTRAVENOUS | Status: DC | PRN
  Administered 2017-09-06 (×2): 80 ug via INTRAVENOUS

## 2017-09-06 MED ORDER — ONDANSETRON HCL 4 MG/2ML IJ SOLN: 4.0000 mg | Freq: Once | INTRAMUSCULAR | Status: DC | PRN

## 2017-09-06 MED ORDER — LIDOCAINE 2% (20 MG/ML) 5 ML SYRINGE
INTRAMUSCULAR | Status: DC | PRN
  Administered 2017-09-06: 100 mg via INTRAVENOUS

## 2017-09-06 MED ORDER — DEXAMETHASONE SODIUM PHOSPHATE 10 MG/ML IJ SOLN
INTRAMUSCULAR | Status: DC | PRN
  Administered 2017-09-06: 5 mg via INTRAVENOUS

## 2017-09-06 MED ORDER — SUCCINYLCHOLINE CHLORIDE 200 MG/10ML IV SOSY
PREFILLED_SYRINGE | INTRAVENOUS | Status: DC | PRN
  Administered 2017-09-06: 100 mg via INTRAVENOUS

## 2017-09-06 MED ORDER — KETOROLAC TROMETHAMINE 30 MG/ML IJ SOLN: 30.0000 mg | Freq: Once | INTRAMUSCULAR | Status: DC | PRN

## 2017-09-06 MED ORDER — GADOBENATE DIMEGLUMINE 529 MG/ML IV SOLN
15.0000 mL | Freq: Once | INTRAVENOUS | Status: AC
  Administered 2017-09-06: 15 mL via INTRAVENOUS

## 2017-09-06 MED ORDER — FENTANYL CITRATE (PF) 100 MCG/2ML IJ SOLN: 25.0000 ug | INTRAMUSCULAR | Status: DC | PRN

## 2017-09-06 MED ORDER — MIDAZOLAM HCL 2 MG/2ML IJ SOLN
INTRAMUSCULAR | Status: DC | PRN
  Administered 2017-09-06: 1 mg via INTRAVENOUS

## 2017-09-06 MED ORDER — LACTATED RINGERS IV SOLN
INTRAVENOUS | Status: DC
  Administered 2017-09-06: 11:00:00 via INTRAVENOUS

## 2017-09-06 MED ORDER — MEPERIDINE HCL 25 MG/ML IJ SOLN: 6.2500 mg | INTRAMUSCULAR | Status: DC | PRN

## 2017-09-06 MED ORDER — ONDANSETRON HCL 4 MG/2ML IJ SOLN
INTRAMUSCULAR | Status: DC | PRN
  Administered 2017-09-06: 4 mg via INTRAVENOUS

## 2017-09-06 MED ORDER — ACETAMINOPHEN 325 MG PO TABS: 325.0000 mg | ORAL_TABLET | ORAL | Status: DC | PRN

## 2017-09-06 NOTE — Progress Notes (Signed)
  Echocardiogram 2D Echocardiogram was attempted but patient was in the OR. Will try again tomorrow.   Jennette Dubin 09/06/2017, 1:12 PM

## 2017-09-06 NOTE — Progress Notes (Signed)
OT Cancellation    09/06/17 1200  OT Visit Information  Last OT Received On 09/06/17  Reason Eval/Treat Not Completed Patient at procedure or test/ unavailable (Will return as schedule allows.)   [place], OTR/L Acute Rehab Pager: [phone] [phone]

## 2017-09-06 NOTE — Progress Notes (Signed)
[facility]UROHOSPITAL[facility] STROKE TEAM - DAILY PROGRESS NOTE   ADMISSION HISTORY:  Misty Morris is an 67 y.o. female with a past medical history of type 2 diabetes mellitus hypertension, dyslipidemia, fibromyalgia rheumatica who presented to the ER this morning with right arm numbness.  Patient was in her usual state of health yesterday and went to bed around 1030 bulk of all 4 AM this morning went to the bathroom, did not notice any abnormalities when she went to the bathroom.  Upon waking up this morning she noticed right arm and right leg numbness that seemed to get worse throughout the morning with some weakness in the right arm as well as unsteady gait.  She denies ever having such symptoms, personal history of stroke or family history of stroke.  Of note patient had what she describes as right eye blindness about 3 weeks ago, per records she had an MRI done under complete anesthesia on January 3, MRI revealed unremarkable orbital imaging but does show mild to moderate chronic small vessel ischemic changes.  Vision is still improving but not back to baseline per patient on assessment patient is awake alert oriented and moves all extremities equally denies impaired sensation in all extremities.   Patient denies any recent headaches, dizziness, syncope, nausea, vomiting or recent fall with head or arm trauma.  LSN: 09/03/2017 around 1030 tPA Given: No: The timeframe Premorbid modified Rankin scale (mRS): 0 NIHSS: 1  SUBJECTIVE (INTERVAL HISTORY) Pt best friend is at the bedside. She is eating dinner at time of rounding. Patient had MRI under general anesthesia. MRI showed left PCA infarcts involving left occipital and left thalamus. Pt dose have right sided numbness and right lower quadrantanopia. MRI also showed small occipital hemosiderin, recommend to repeat CT in am.     OBJECTIVE Lab Results: CBC:  Recent Labs  Lab 09/04/17 1048  09/04/17 1102 09/06/17 0531  WBC 10.3  --  9.1  HGB 14.5 15.0 12.8  HCT 44.0 44.0 39.5  MCV 84.0  --  84.2  PLT 308  --  293   BMP: Recent Labs  Lab 09/04/17 1048 09/04/17 1102 09/06/17 0531  NA 138 141 140  K 3.3* 3.3* 3.6  CL 102 102 106  CO2 22  --  24  GLUCOSE 236* 234* 204*  BUN 11 12 11   CREATININE 0.83 0.70 0.74  CALCIUM 9.3  --  9.1   Liver Function Tests:  Recent Labs  Lab 09/04/17 1048  AST 18  ALT 19  ALKPHOS 84  BILITOT 0.7  PROT 7.0  ALBUMIN 4.2   Thyroid Function Studies:  Recent Labs    09/05/17 0927  TSH 0.649   Coagulation Studies:  Recent Labs    09/04/17 1048  APTT 28  INR 0.93   PHYSICAL EXAM Temp:  [97.9 F (36.6 C)-98.6 F (37 C)] 98.2 F (36.8 C) (01/29 1720) Pulse Rate:  [71-92] 86 (01/29 1720) Resp:  [13-18] 18 (01/29 1720) BP: (140-188)/(78-96) 144/80 (01/29 1720) SpO2:  [94 %-100 %] 98 % (01/29 1720) Weight:  [156 lb (70.8 kg)] 156 lb (70.8 kg) (01/28 2010) General - Well nourished, well developed, in no apparent distress HEENT-  Normocephalic,  Cardiovascular - Regular rate and rhythm  Respiratory - Lungs clear bilaterally. No wheezing. Abdomen - soft and non-tender, BS normal Extremities- no edema or cyanosis Mental Status: Alert, oriented, thought content appropriate.  Speech fluent without evidence of aphasia.  Able to follow 3 step commands without difficulty. Cranial Nerves: II:  Visual field cut on Right  III,IV, VI: ptosis not present, extra-ocular motions intact bilaterally pupils equal, round, reactive to light and accommodation V,VII: smile symmetric, facial light touch sensation normal bilaterally VIII: Hearing intact to voice IX,X: uvula rises symmetrically XI: bilateral shoulder shrug XII: midline tongue extension Motor: No arm drift or leg drift Right : Upper extremity   4+/5    Left:     Upper extremity   5/5  Lower extremity   5/5     Lower extremity   5/5 Tone and bulk:normal tone throughout; no  atrophy noted Sensory: Pinprick and light touch intact throughout, bilaterally Deep Tendon Reflexes: +1 reflex in left lower extremities and throughout Plantars: Right: downgoing   Left: downgoing Cerebellar: normal finger-to-nose, Gait: Deferred  IMAGING: I have personally reviewed the radiological images below and agree with the radiology interpretations. Ct Head Wo Contrast Result Date: 09/04/2017 IMPRESSION: No acute finding by CT. Chronic small-vessel ischemic changes of the hemispheric white matter. Electronically Signed   By: [name] M.D.   On: 09/04/2017 12:27   Mr [name] Neck W Wo Contrast  Addendum Date: 09/06/2017   ADDENDUM REPORT: 09/06/2017 15:40 ADDENDUM: Study discussed by telephone with Dr. [name] on 09/06/2017 at 1528 hours. I advised her that the thin rim of FLAIR hyperintensity along the posterior left occipital lobe (corresponding to the questionable small SDH in #2) might be artifact due to hyper-oxygenation of the patient in the setting of general anesthesia under which this study was performed. Electronically Signed   By: Genevie Ann M.D.   On: 09/06/2017 15:40   Result Date: 09/06/2017 CLINICAL DATA:  67 year old female who presented with acute onset right extremity numbness on 09/04/2017. Recent loss of vision in the right eye, improving since onset, for which MRI evaluation was performed earlier this month. EXAM: MRI HEAD WITHOUT AND WITH CONTRAST MRA HEAD WITHOUT CONTRAST MRA NECK WITHOUT AND WITH CONTRAST TECHNIQUE: Multiplanar, multiecho pulse sequences of the brain and surrounding structures were obtained without and with intravenous contrast. Angiographic images of the Circle of Willis were obtained using MRA technique without intravenous contrast. Angiographic images of the neck were obtained using MRA technique without and with intravenous contrast. Carotid stenosis measurements (when applicable) are obtained utilizing NASCET criteria, using the distal internal carotid  diameter as the denominator. CONTRAST:  6mL MULTIHANCE GADOBENATE DIMEGLUMINE 529 MG/ML IV SOLN COMPARISON:  Brain and orbit MRI without and with contrast 08/11/2017. FINDINGS: MRI HEAD FINDINGS Brain: New since the 08/11/2017 comparison there are scattered mostly small areas of restricted diffusion in the left PCA territory, including the occipital pole, about the calcarine sulcus, in the left thalamus, the left splenium, and also affecting the tail of the left hippocampus (series 3, image 24). Associated T2 and FLAIR hyperintensity in the areas of abnormal diffusion. There is associated petechial hemorrhage in the left occipital lobe. No associated mass effect. Trace associated post ischemic enhancement, primarily at the left occipital pole. Furthermore, there is a small cortically based area of mixed appearing diffusion signal at the junction of the superior left occipital and parietal lobes along the posterior parieto-occipital sulcus as seen on series 3, image 32. This has T2 and FLAIR hyperintensity with new cortical hemosiderin from the recent MRI (series 9, image 67), and questionable trace posterior convexity left subdural blood (series 8, image 17). No definite extra-axial hemorrhage elsewhere. No intraventricular hemorrhage or ventriculomegaly. No contralateral right hemisphere or posterior fossa restricted diffusion. Stable gray and white matter signal elsewhere with  patchy bilateral cerebral white matter T2 and FLAIR hyperintensity. No ventriculomegaly. Normal basilar cisterns. Negative pituitary and cervicomedullary junction. No dural thickening. No other abnormal intracranial enhancement; small developmental venous anomaly in the inferior right basal ganglia redemonstrated (normal variant series 17, image 19). Vascular: Major intracranial vascular flow voids are stable. Mild intracranial artery tortuosity. The distal right vertebral artery appears dominant. Skull and upper cervical spine: Negative  visualized cervical spine. Normal bone marrow signal. Sinuses/Orbits: Stable and negative. Other: Fluid layering in the pharynx. The patient appears to be intubated currently. Visible internal auditory structures appear normal. Mastoids remain clear. Negative scalp soft tissues. MRA NECK FINDINGS Precontrast time-of-flight images reveal antegrade flow in both carotid and vertebral arteries throughout the neck and to the skull base. Post-contrast neck MRA images reveal a 3 vessel arch configuration. No great vessel origin stenosis. Normal right CCA aside from mild tortuosity. There is mild irregularity at the right ICA origin and bulb compatible with atherosclerosis, but no associated stenosis. There is tortuosity of the cervical right ICA just distal to the bulb with a mildly kinked appearance, but otherwise no cervical right ICA stenosis. Negative left CCA aside from tortuosity. There is moderate irregularity at the left carotid bifurcation in keeping with atherosclerosis. Incidental stenosis of the left ECA origin is noted. No significant proximal left ICA stenosis. Tortuosity of the ICA just distal to the bulb with a kinked appearance similar to that on the left. No other cervical left ICA stenosis. Tortuous bilateral vertebral arteries in the neck. The right is mildly dominant. No stenosis at the vertebral artery origins or elsewhere proximal to the skull base. MRA HEAD FINDINGS Tortuous distal vertebral arteries, the right is dominant. Both PICA origins are patent. There is mild to moderate irregularity and stenosis in the right V4 segment, better demonstrated on the post-contrast MRA neck images above (series 160151, image 8). No distal left vertebral artery stenosis. Patent vertebrobasilar junction. Tortuous basilar artery without stenosis. Normal SCA and PCA origins. Posterior communicating arteries are diminutive or absent. There is multifocal severe irregularity and stenosis in the left PCA P1 and P2  segments. However, there is preserved distal left PCA flow signal. There is moderate to severe stenosis in the distal right PCA, P3 segments. Antegrade flow in both ICA siphons. No siphon stenosis. Normal ophthalmic artery origins. Patent carotid termini. Normal MCA and ACA origins. Visible bilateral ACA branches are within normal limits. Bilateral MCA M1 segments and MCA bifurcations appear patent without stenosis. Visible bilateral MCA branches are within normal limits. IMPRESSION: 1. Positive for scattered acute infarcts in the Left PCA territory with petechial hemorrhage. 2. Superimposed small subacute appearing infarct with hemosiderin in the superior left occipital lobe (series 8, image 17) with questionable associated trace left posterior convexity Subdural Hematoma. Recommend repeat noncontrast Head CT with attention to this area. 3. MRA positive for multifocal severe stenosis in the left PCA, but no large vessel occlusion. 4. Neck MRA reveals tortuous cervical carotid and vertebral arteries with atherosclerosis but no stenosis. 5. Head MRA reveals intracranial atherosclerosis with up to moderate stenosis of the dominant distal right vertebral artery, and severe stenosis the right PCA P3 division. 6. Aside from #1 and #2 stable MRI appearance of the brain since 08/11/2017. Electronically Signed: By: Genevie Ann M.D. On: 09/06/2017 15:23   Mr Jeri Cos YF Contrast  Addendum Date: 09/06/2017   ADDENDUM REPORT: 09/06/2017 15:40 ADDENDUM: Study discussed by telephone with Dr. [name] on 09/06/2017 at 1528 hours. I advised her  that the thin rim of FLAIR hyperintensity along the posterior left occipital lobe (corresponding to the questionable small SDH in #2) might be artifact due to hyper-oxygenation of the patient in the setting of general anesthesia under which this study was performed. Electronically Signed   By: Genevie Ann M.D.   On: 09/06/2017 15:40   Result Date: 09/06/2017 CLINICAL DATA:  67 year old female who  presented with acute onset right extremity numbness on 09/04/2017. Recent loss of vision in the right eye, improving since onset, for which MRI evaluation was performed earlier this month. EXAM: MRI HEAD WITHOUT AND WITH CONTRAST MRA HEAD WITHOUT CONTRAST MRA NECK WITHOUT AND WITH CONTRAST TECHNIQUE: Multiplanar, multiecho pulse sequences of the brain and surrounding structures were obtained without and with intravenous contrast. Angiographic images of the Circle of Willis were obtained using MRA technique without intravenous contrast. Angiographic images of the neck were obtained using MRA technique without and with intravenous contrast. Carotid stenosis measurements (when applicable) are obtained utilizing NASCET criteria, using the distal internal carotid diameter as the denominator. CONTRAST:  24mL MULTIHANCE GADOBENATE DIMEGLUMINE 529 MG/ML IV SOLN COMPARISON:  Brain and orbit MRI without and with contrast 08/11/2017. FINDINGS: MRI HEAD FINDINGS Brain: New since the 08/11/2017 comparison there are scattered mostly small areas of restricted diffusion in the left PCA territory, including the occipital pole, about the calcarine sulcus, in the left thalamus, the left splenium, and also affecting the tail of the left hippocampus (series 3, image 24). Associated T2 and FLAIR hyperintensity in the areas of abnormal diffusion. There is associated petechial hemorrhage in the left occipital lobe. No associated mass effect. Trace associated post ischemic enhancement, primarily at the left occipital pole. Furthermore, there is a small cortically based area of mixed appearing diffusion signal at the junction of the superior left occipital and parietal lobes along the posterior parieto-occipital sulcus as seen on series 3, image 32. This has T2 and FLAIR hyperintensity with new cortical hemosiderin from the recent MRI (series 9, image 67), and questionable trace posterior convexity left subdural blood (series 8, image 17).  No definite extra-axial hemorrhage elsewhere. No intraventricular hemorrhage or ventriculomegaly. No contralateral right hemisphere or posterior fossa restricted diffusion. Stable gray and white matter signal elsewhere with patchy bilateral cerebral white matter T2 and FLAIR hyperintensity. No ventriculomegaly. Normal basilar cisterns. Negative pituitary and cervicomedullary junction. No dural thickening. No other abnormal intracranial enhancement; small developmental venous anomaly in the inferior right basal ganglia redemonstrated (normal variant series 17, image 19). Vascular: Major intracranial vascular flow voids are stable. Mild intracranial artery tortuosity. The distal right vertebral artery appears dominant. Skull and upper cervical spine: Negative visualized cervical spine. Normal bone marrow signal. Sinuses/Orbits: Stable and negative. Other: Fluid layering in the pharynx. The patient appears to be intubated currently. Visible internal auditory structures appear normal. Mastoids remain clear. Negative scalp soft tissues. MRA NECK FINDINGS Precontrast time-of-flight images reveal antegrade flow in both carotid and vertebral arteries throughout the neck and to the skull base. Post-contrast neck MRA images reveal a 3 vessel arch configuration. No great vessel origin stenosis. Normal right CCA aside from mild tortuosity. There is mild irregularity at the right ICA origin and bulb compatible with atherosclerosis, but no associated stenosis. There is tortuosity of the cervical right ICA just distal to the bulb with a mildly kinked appearance, but otherwise no cervical right ICA stenosis. Negative left CCA aside from tortuosity. There is moderate irregularity at the left carotid bifurcation in keeping with atherosclerosis. Incidental stenosis of  the left ECA origin is noted. No significant proximal left ICA stenosis. Tortuosity of the ICA just distal to the bulb with a kinked appearance similar to that on the  left. No other cervical left ICA stenosis. Tortuous bilateral vertebral arteries in the neck. The right is mildly dominant. No stenosis at the vertebral artery origins or elsewhere proximal to the skull base. MRA HEAD FINDINGS Tortuous distal vertebral arteries, the right is dominant. Both PICA origins are patent. There is mild to moderate irregularity and stenosis in the right V4 segment, better demonstrated on the post-contrast MRA neck images above (series 160151, image 8). No distal left vertebral artery stenosis. Patent vertebrobasilar junction. Tortuous basilar artery without stenosis. Normal SCA and PCA origins. Posterior communicating arteries are diminutive or absent. There is multifocal severe irregularity and stenosis in the left PCA P1 and P2 segments. However, there is preserved distal left PCA flow signal. There is moderate to severe stenosis in the distal right PCA, P3 segments. Antegrade flow in both ICA siphons. No siphon stenosis. Normal ophthalmic artery origins. Patent carotid termini. Normal MCA and ACA origins. Visible bilateral ACA branches are within normal limits. Bilateral MCA M1 segments and MCA bifurcations appear patent without stenosis. Visible bilateral MCA branches are within normal limits. IMPRESSION: 1. Positive for scattered acute infarcts in the Left PCA territory with petechial hemorrhage. 2. Superimposed small subacute appearing infarct with hemosiderin in the superior left occipital lobe (series 8, image 17) with questionable associated trace left posterior convexity Subdural Hematoma. Recommend repeat noncontrast Head CT with attention to this area. 3. MRA positive for multifocal severe stenosis in the left PCA, but no large vessel occlusion. 4. Neck MRA reveals tortuous cervical carotid and vertebral arteries with atherosclerosis but no stenosis. 5. Head MRA reveals intracranial atherosclerosis with up to moderate stenosis of the dominant distal right vertebral artery, and  severe stenosis the right PCA P3 division. 6. Aside from #1 and #2 stable MRI appearance of the brain since 08/11/2017. Electronically Signed: By: Genevie Ann M.D. On: 09/06/2017 15:23   Mr [name] Head Wo Contrast  Addendum Date: 09/06/2017   ADDENDUM REPORT: 09/06/2017 15:40 ADDENDUM: Study discussed by telephone with Dr. [name] on 09/06/2017 at 1528 hours. I advised her that the thin rim of FLAIR hyperintensity along the posterior left occipital lobe (corresponding to the questionable small SDH in #2) might be artifact due to hyper-oxygenation of the patient in the setting of general anesthesia under which this study was performed. Electronically Signed   By: Genevie Ann M.D.   On: 09/06/2017 15:40   Result Date: 09/06/2017 CLINICAL DATA:  67 year old female who presented with acute onset right extremity numbness on 09/04/2017. Recent loss of vision in the right eye, improving since onset, for which MRI evaluation was performed earlier this month. EXAM: MRI HEAD WITHOUT AND WITH CONTRAST MRA HEAD WITHOUT CONTRAST MRA NECK WITHOUT AND WITH CONTRAST TECHNIQUE: Multiplanar, multiecho pulse sequences of the brain and surrounding structures were obtained without and with intravenous contrast. Angiographic images of the Circle of Willis were obtained using MRA technique without intravenous contrast. Angiographic images of the neck were obtained using MRA technique without and with intravenous contrast. Carotid stenosis measurements (when applicable) are obtained utilizing NASCET criteria, using the distal internal carotid diameter as the denominator. CONTRAST:  88mL MULTIHANCE GADOBENATE DIMEGLUMINE 529 MG/ML IV SOLN COMPARISON:  Brain and orbit MRI without and with contrast 08/11/2017. FINDINGS: MRI HEAD FINDINGS Brain: New since the 08/11/2017 comparison there are scattered mostly small  areas of restricted diffusion in the left PCA territory, including the occipital pole, about the calcarine sulcus, in the left thalamus, the  left splenium, and also affecting the tail of the left hippocampus (series 3, image 24). Associated T2 and FLAIR hyperintensity in the areas of abnormal diffusion. There is associated petechial hemorrhage in the left occipital lobe. No associated mass effect. Trace associated post ischemic enhancement, primarily at the left occipital pole. Furthermore, there is a small cortically based area of mixed appearing diffusion signal at the junction of the superior left occipital and parietal lobes along the posterior parieto-occipital sulcus as seen on series 3, image 32. This has T2 and FLAIR hyperintensity with new cortical hemosiderin from the recent MRI (series 9, image 67), and questionable trace posterior convexity left subdural blood (series 8, image 17). No definite extra-axial hemorrhage elsewhere. No intraventricular hemorrhage or ventriculomegaly. No contralateral right hemisphere or posterior fossa restricted diffusion. Stable gray and white matter signal elsewhere with patchy bilateral cerebral white matter T2 and FLAIR hyperintensity. No ventriculomegaly. Normal basilar cisterns. Negative pituitary and cervicomedullary junction. No dural thickening. No other abnormal intracranial enhancement; small developmental venous anomaly in the inferior right basal ganglia redemonstrated (normal variant series 17, image 19). Vascular: Major intracranial vascular flow voids are stable. Mild intracranial artery tortuosity. The distal right vertebral artery appears dominant. Skull and upper cervical spine: Negative visualized cervical spine. Normal bone marrow signal. Sinuses/Orbits: Stable and negative. Other: Fluid layering in the pharynx. The patient appears to be intubated currently. Visible internal auditory structures appear normal. Mastoids remain clear. Negative scalp soft tissues. MRA NECK FINDINGS Precontrast time-of-flight images reveal antegrade flow in both carotid and vertebral arteries throughout the neck  and to the skull base. Post-contrast neck MRA images reveal a 3 vessel arch configuration. No great vessel origin stenosis. Normal right CCA aside from mild tortuosity. There is mild irregularity at the right ICA origin and bulb compatible with atherosclerosis, but no associated stenosis. There is tortuosity of the cervical right ICA just distal to the bulb with a mildly kinked appearance, but otherwise no cervical right ICA stenosis. Negative left CCA aside from tortuosity. There is moderate irregularity at the left carotid bifurcation in keeping with atherosclerosis. Incidental stenosis of the left ECA origin is noted. No significant proximal left ICA stenosis. Tortuosity of the ICA just distal to the bulb with a kinked appearance similar to that on the left. No other cervical left ICA stenosis. Tortuous bilateral vertebral arteries in the neck. The right is mildly dominant. No stenosis at the vertebral artery origins or elsewhere proximal to the skull base. MRA HEAD FINDINGS Tortuous distal vertebral arteries, the right is dominant. Both PICA origins are patent. There is mild to moderate irregularity and stenosis in the right V4 segment, better demonstrated on the post-contrast MRA neck images above (series 160151, image 8). No distal left vertebral artery stenosis. Patent vertebrobasilar junction. Tortuous basilar artery without stenosis. Normal SCA and PCA origins. Posterior communicating arteries are diminutive or absent. There is multifocal severe irregularity and stenosis in the left PCA P1 and P2 segments. However, there is preserved distal left PCA flow signal. There is moderate to severe stenosis in the distal right PCA, P3 segments. Antegrade flow in both ICA siphons. No siphon stenosis. Normal ophthalmic artery origins. Patent carotid termini. Normal MCA and ACA origins. Visible bilateral ACA branches are within normal limits. Bilateral MCA M1 segments and MCA bifurcations appear patent without  stenosis. Visible bilateral MCA branches are within normal  limits. IMPRESSION: 1. Positive for scattered acute infarcts in the Left PCA territory with petechial hemorrhage. 2. Superimposed small subacute appearing infarct with hemosiderin in the superior left occipital lobe (series 8, image 17) with questionable associated trace left posterior convexity Subdural Hematoma. Recommend repeat noncontrast Head CT with attention to this area. 3. MRA positive for multifocal severe stenosis in the left PCA, but no large vessel occlusion. 4. Neck MRA reveals tortuous cervical carotid and vertebral arteries with atherosclerosis but no stenosis. 5. Head MRA reveals intracranial atherosclerosis with up to moderate stenosis of the dominant distal right vertebral artery, and severe stenosis the right PCA P3 division. 6. Aside from #1 and #2 stable MRI appearance of the brain since 08/11/2017. Electronically Signed: By: Genevie Ann M.D. On: 09/06/2017 15:23    Echocardiogram:                                              PENDING     IMPRESSION: [name] is a 67 y.o. female with PMH of hypertension, dyslipidemia, diabetes, blood pressure who presents to the ED with complaints of right-sided numbness and weakness. Initial CT head done on admission was negative, pending MRI, MRA of the head and neck which will occur under general anesthesia on Tuesday morning.  Not a candidate for tPA - OSW Not a candidate for endovascular - exam not suggestive of LVO.  Suspected Etiology:  athero vs cardioembolic source  Resultant Symptoms: right-sided numbness and weakness Stroke Risk Factors: diabetes mellitus, hyperlipidemia and hypertension Other Stroke Risk Factors: Advanced age  Outstanding Stroke Work-up Studies:     Echocardiogram:                                                     MRI/MRA Head  09/06/2017 ASSESSMENT:   Neuro exam stable. Continues to complain of Right sided numbness but much improved from  yesterday's exam. Continues to complain of Right sided vision loss. MRI/MRA pending for tomorrow morning. Imaging/Labs and POC reviewed with patient  PLAN  09/06/2017: Continue Aspirin/ Statin Frequent neuro checks Telemetry monitoring PT/OT/SLP Consult Case Management /MSW Ongoing aggressive stroke risk factor management Patient counseled to be compliant with her antithrombotic medications Patient counseled on Lifestyle modifications including, Diet, Exercise, and Stress Follow up with Dr [facility] in 6 weeks   Fibromyalgia Continue current fibromyalgia med regimen  HYPERTENSION: Stable Permissive hypertension (OK if <220/120) for 24-48 hours post stroke and then gradually normalized within 5-7 days. Long term BP goal normotensive. May slowly restart home B/P medications after 48 hours Home Meds: Norvasc  HYPERLIPIDEMIA:    Component Value Date/Time   CHOL 251 (H) 09/05/2017 0458   TRIG 331 (H) 09/05/2017 0458   HDL 42 09/05/2017 0458   CHOLHDL 6.0 09/05/2017 0458   VLDL 66 (H) 09/05/2017 0458   LDLCALC 143 (H) 09/05/2017 0458  Home Meds:  NONE LDL  goal < 70 Started on  Lipitor to 80 mg daily Continue statin at discharge  DIABETES: Lab Results  Component Value Date   HGBA1C 8.7 (H) 08/11/2017  HgbA1c goal < 7.0 Continue CBG monitoring and SSI to maintain glucose 140-180 mg/dl DM education   Other Active Problems:  Principal Problem:   Stroke [facility]) Active Problems:   Hypertension complicating diabetes ([place])   Type 2 diabetes mellitus with ophthalmic complication ([facility])   Polymyalgia rheumatica ([place])   Fibromyalgia    Hospital day # 1 VTE prophylaxis: SCD's Diet : Fall precautions Fall precautions Diet heart healthy/carb modified Room service appropriate? Yes; Fluid consistency: Thin   FAMILY UPDATES: No family at bedside  TEAM UPDATES: Geradine Girt, DO STATUS:  DNR  Palliative care DNI/DNR or comfort measures   Prior Home Stroke  Medications:  No antithrombotic  Discharge Stroke Meds:  Please discharge patient on aspirin 325 mg daily   Disposition: 01-Home or Self Care Therapy Recs:               PENDING Follow Up:  Follow-up Information    Sater, Nanine Means, MD. Schedule an appointment as soon as possible for a visit in 4 week(s).   Specialty:  Neurology Contact information: [address] [place] [address] [phone]          Alroy Dust, L.Marlou Sa, MD -PCP Follow up in 1-2 weeks      Assessment & plan discussed with with attending physician and they are in agreement.    Renie Ora Stroke Neurology Team 09/06/2017 7:02 PM  ATTENDING NOTE: I reviewed above note and agree with the assessment and plan. I have made any additions or clarifications directly to the above note. Pt was seen and examined.   67 year old female with history of diabetes, hypertension, hyperlipidemia, polyarthritis rheumatica admitted for right arm and leg numbness as well as mild weakness.    She stated that at her mid 55s she had right sided vision difficulty with patchy vision loss, concerning for multiple sclerosis.  However, over the years, right vision getting improved some and no more recurrence.  About 3 weeks ago, patient had right side vision worsening to near blind, went to see her ophthalmologist, and a fundi exam concerning for papilledema on the right, had MRI brain and orbital under general anesthesia which were both negative.  She was diagnosed with right ischemic optic neuropathy.    This admission, patient had right-sided paresthesia and right lower quadrantanopia. MRI showed left PCA infarcts involving left occipital and left thalamus. MRA showed left PCA multifocal stenosis and right P3 stenosis and right distal VA V gnosis.  2D echo pending, A1c 8.7, and LDL 143.  Patient current stroke and recent ischemic optic neuropathy likely due to uncontrolled risk factors of diabetes and hyperlipidemia as well as  hypertension.  Due to intracranial stenosis, recommend aspirin and Plavix for 3 months and then Plavix alone.  Continue Lipitor for stroke prevention.  Patient will also follow-up with ophthalmology for visual field monitoring.  Neurology will sign off. Please call with questions. Pt will follow up with Cecille Rubin, NP, at [facility] in about 6 weeks. Thanks for the consult.   Rosalin Hawking, MD PhD Stroke Neurology 09/06/2017 7:02 PM   To contact Stroke Continuity provider, please refer to [email or web address] After hours, contact General Neurology

## 2017-09-06 NOTE — Progress Notes (Signed)
PROGRESS NOTE    Misty Morris  UTM:[id number] DOB: Apr 30, 1951 DOA: 09/04/2017 PCP: Alroy Dust, L.Marlou Sa, MD   Outpatient Specialists:    Brief Narrative:  Misty Morris is a 67 y.o. female with medical history significant of diabetes type 2 diet-controlled, hypertension, hyperlipidemia, fibromyalgia, and polymyalgia rheumatica who presents with complaints of right-sided numbness.  Patient awoke at 4 AM and was normal as best she could tell but she was somewhat drowsy.  When she awoke later in the morning she had some numbness and weakness of her right arm and hand and some right leg weakness.  She had a slight left facial droop on presentation to the emergency department which appears to have resolved.  She denies any noncompliance with medications.  She took her medications this morning.  She is followed primarily by Dr. Donnie Coffin for her diabetes and hypertension and by Dr. Jenetta Downer from rheumatology for her polymyalgia rheumatica and fibromyalgia.  She has not had any changes in her medications.  Presentation to the emergency department her blood pressure is elevated.  Patient reports never having had symptoms like this before.  She denies any vision changes.  Had no fever, vomiting, diarrhea, or recent illness.  She has no personal or family history of stroke.  She does have several risk factors as noted above.  NIH stroke scale based on evaluation is 2.  She is a very good historian.  The patient was seen in the emergency department by Dr. Malen Gauze from neurology who feels that she should be admitted and she will need an MRI with full sedation.  She had an MRI earlier this month and needed to have anesthesia sedation was unable to undergo the MRI even with benzodiazepine treatment.  This was done for visual blindness appears to be improving.     Assessment & Plan:   Principal Problem:   Stroke [facility]) Active Problems:   Hypertension complicating diabetes ([place])   Type 2 diabetes mellitus  with ophthalmic complication (H[facility])   Polymyalgia rheumatica (HCC)   Fibromyalgia   Stroke like symptoms -resolving per patient -telemetry -MRI of the head with full sedation:1. Positive for scattered acute infarcts in the Left PCA territory with petechial hemorrhage. 2. Superimposed small subacute appearing infarct with hemosiderin in the superior left occipital lobe (series 8, image 17) with questionable associated trace left posterior convexity Subdural Hematoma. Recommend repeat noncontrast Head CT with attention to this area. 3. MRA positive for multifocal severe stenosis in the left PCA, but no large vessel occlusion. 4. Neck MRA reveals tortuous cervical carotid and vertebral arteries with atherosclerosis but no stenosis. 5. Head MRA reveals intracranial atherosclerosis with up to moderate stenosis of the dominant distal right vertebral artery, and severe stenosis the right PCA P3 division. 6. Aside from #1 and #2 stable MRI appearance of the brain since 08/11/2017. -echo pending -aspirin/statin started but suspect patient will refuse LDL: 143  -HgbA1C: 8.7 (refusing medications) -CT scan in the AM -await evaluation by Dr. Erlinda Hong  Hypertension complicating diabetes -Hold amlodipine permissive hypertension for now.  Type 2 diabetes mellitus with ophthalmologic complication:  -refusing all insulin/PO medications-- states she controls with diet and herbs -long discussion with patient regarding her risk factors -will discuss with PCP metformin -last HgbA1c: 8.7  Polymyalgia rheumatica:  -Continue home medication regimen (combo prednisone/prednisolone?) -Patient sees Dr. Sharol Given.  Fibromyalgia:  -Continue home treatment regimen.  Follow-up with Dr. Jenetta Downer.  Hypokalemia -replete and recheck     DVT prophylaxis:  Lovenox  Code Status: Full Code   Family Communication:   Disposition Plan:     Consultants:   neuro     Subjective: Refusing SSI  or medications for LDL and Blood sugars  Objective: Vitals:   09/06/17 1453 09/06/17 1508 09/06/17 1518 09/06/17 1521  BP: (!) 151/91 (!) 146/87  (!) 145/85  Pulse: 92 83  83  Resp: 13 17  15   Temp: 98.3 F (36.8 C)  98.1 F (36.7 C)   TempSrc:      SpO2: 97% 100%  100%  Weight:      Height:        Intake/Output Summary (Last 24 hours) at 09/06/2017 1612 Last data filed at 09/06/2017 1500 Gross per 24 hour  Intake 38.17 ml  Output -  Net 38.17 ml   Filed Weights   09/05/17 2010  Weight: 70.8 kg (156 lb)    Examination:  General exam: Appears calm and comfortable - at sink washing up Respiratory system: no increase work of breathing Central nervous system: Alert and oriented. No focal neurological deficits. Extremities: moves all 4 ext Skin: No rashes, lesions or ulcers     Data Reviewed: I have personally reviewed following labs and imaging studies  CBC: Recent Labs  Lab 09/04/17 1048 09/04/17 1102 09/06/17 0531  WBC 10.3  --  9.1  NEUTROABS 6.6  --   --   HGB 14.5 15.0 12.8  HCT 44.0 44.0 39.5  MCV 84.0  --  84.2  PLT 308  --  427   Basic Metabolic Panel: Recent Labs  Lab 09/04/17 1048 09/04/17 1102 09/06/17 0531  NA 138 141 140  K 3.3* 3.3* 3.6  CL 102 102 106  CO2 22  --  24  GLUCOSE 236* 234* 204*  BUN 11 12 11   CREATININE 0.83 0.70 0.74  CALCIUM 9.3  --  9.1   GFR: Estimated Creatinine Clearance: 64.5 mL/min (by C-G formula based on SCr of 0.74 mg/dL). Liver Function Tests: Recent Labs  Lab 09/04/17 1048  AST 18  ALT 19  ALKPHOS 84  BILITOT 0.7  PROT 7.0  ALBUMIN 4.2   No results for input(s): LIPASE, AMYLASE in the last 168 hours. No results for input(s): AMMONIA in the last 168 hours. Coagulation Profile: Recent Labs  Lab 09/04/17 1048  INR 0.93   Cardiac Enzymes: No results for input(s): CKTOTAL, CKMB, CKMBINDEX, TROPONINI in the last 168 hours. BNP (last 3 results) No results for input(s): PROBNP in the last 8760  hours. HbA1C: No results for input(s): HGBA1C in the last 72 hours. CBG: Recent Labs  Lab 09/05/17 1632 09/05/17 2130 09/06/17 0636 09/06/17 1101 09/06/17 1503  GLUCAP 161* 232* 160* 100* 141*   Lipid Profile: Recent Labs    09/05/17 0458  CHOL 251*  HDL 42  LDLCALC 143*  TRIG 331*  CHOLHDL 6.0   Thyroid Function Tests: Recent Labs    09/05/17 0927  TSH 0.649   Anemia Panel: No results for input(s): VITAMINB12, FOLATE, FERRITIN, TIBC, IRON, RETICCTPCT in the last 72 hours. Urine analysis:    Component Value Date/Time   COLORURINE YELLOW 09/23/2015 1110   APPEARANCEUR CLEAR 09/23/2015 1110   LABSPEC 1.025 09/23/2015 1110   PHURINE 6.0 09/23/2015 1110   GLUCOSEU 100 (A) 09/23/2015 1110   HGBUR NEGATIVE 09/23/2015 1110   BILIRUBINUR NEGATIVE 09/23/2015 1110   KETONESUR NEGATIVE 09/23/2015 1110   PROTEINUR NEGATIVE 09/23/2015 1110   UROBILINOGEN 0.2 06/04/2011 1034   NITRITE NEGATIVE 09/23/2015 1110  LEUKOCYTESUR NEGATIVE 09/23/2015 1110     )No results found for this or any previous visit (from the past 240 hour(s)).    Anti-infectives (From admission, onward)   None       Radiology Studies: Mr [name] Neck W Wo Contrast  Addendum Date: 09/06/2017   ADDENDUM REPORT: 09/06/2017 15:40 ADDENDUM: Study discussed by telephone with Dr. [name] on 09/06/2017 at 1528 hours. I advised her that the thin rim of FLAIR hyperintensity along the posterior left occipital lobe (corresponding to the questionable small SDH in #2) might be artifact due to hyper-oxygenation of the patient in the setting of general anesthesia under which this study was performed. Electronically Signed   By: Genevie Ann M.D.   On: 09/06/2017 15:40   Result Date: 09/06/2017 CLINICAL DATA:  67 year old female who presented with acute onset right extremity numbness on 09/04/2017. Recent loss of vision in the right eye, improving since onset, for which MRI evaluation was performed earlier this month. EXAM: MRI  HEAD WITHOUT AND WITH CONTRAST MRA HEAD WITHOUT CONTRAST MRA NECK WITHOUT AND WITH CONTRAST TECHNIQUE: Multiplanar, multiecho pulse sequences of the brain and surrounding structures were obtained without and with intravenous contrast. Angiographic images of the Circle of Willis were obtained using MRA technique without intravenous contrast. Angiographic images of the neck were obtained using MRA technique without and with intravenous contrast. Carotid stenosis measurements (when applicable) are obtained utilizing NASCET criteria, using the distal internal carotid diameter as the denominator. CONTRAST:  42mL MULTIHANCE GADOBENATE DIMEGLUMINE 529 MG/ML IV SOLN COMPARISON:  Brain and orbit MRI without and with contrast 08/11/2017. FINDINGS: MRI HEAD FINDINGS Brain: New since the 08/11/2017 comparison there are scattered mostly small areas of restricted diffusion in the left PCA territory, including the occipital pole, about the calcarine sulcus, in the left thalamus, the left splenium, and also affecting the tail of the left hippocampus (series 3, image 24). Associated T2 and FLAIR hyperintensity in the areas of abnormal diffusion. There is associated petechial hemorrhage in the left occipital lobe. No associated mass effect. Trace associated post ischemic enhancement, primarily at the left occipital pole. Furthermore, there is a small cortically based area of mixed appearing diffusion signal at the junction of the superior left occipital and parietal lobes along the posterior parieto-occipital sulcus as seen on series 3, image 32. This has T2 and FLAIR hyperintensity with new cortical hemosiderin from the recent MRI (series 9, image 67), and questionable trace posterior convexity left subdural blood (series 8, image 17). No definite extra-axial hemorrhage elsewhere. No intraventricular hemorrhage or ventriculomegaly. No contralateral right hemisphere or posterior fossa restricted diffusion. Stable gray and white  matter signal elsewhere with patchy bilateral cerebral white matter T2 and FLAIR hyperintensity. No ventriculomegaly. Normal basilar cisterns. Negative pituitary and cervicomedullary junction. No dural thickening. No other abnormal intracranial enhancement; small developmental venous anomaly in the inferior right basal ganglia redemonstrated (normal variant series 17, image 19). Vascular: Major intracranial vascular flow voids are stable. Mild intracranial artery tortuosity. The distal right vertebral artery appears dominant. Skull and upper cervical spine: Negative visualized cervical spine. Normal bone marrow signal. Sinuses/Orbits: Stable and negative. Other: Fluid layering in the pharynx. The patient appears to be intubated currently. Visible internal auditory structures appear normal. Mastoids remain clear. Negative scalp soft tissues. MRA NECK FINDINGS Precontrast time-of-flight images reveal antegrade flow in both carotid and vertebral arteries throughout the neck and to the skull base. Post-contrast neck MRA images reveal a 3 vessel arch configuration. No great vessel origin stenosis.  Normal right CCA aside from mild tortuosity. There is mild irregularity at the right ICA origin and bulb compatible with atherosclerosis, but no associated stenosis. There is tortuosity of the cervical right ICA just distal to the bulb with a mildly kinked appearance, but otherwise no cervical right ICA stenosis. Negative left CCA aside from tortuosity. There is moderate irregularity at the left carotid bifurcation in keeping with atherosclerosis. Incidental stenosis of the left ECA origin is noted. No significant proximal left ICA stenosis. Tortuosity of the ICA just distal to the bulb with a kinked appearance similar to that on the left. No other cervical left ICA stenosis. Tortuous bilateral vertebral arteries in the neck. The right is mildly dominant. No stenosis at the vertebral artery origins or elsewhere proximal to the  skull base. MRA HEAD FINDINGS Tortuous distal vertebral arteries, the right is dominant. Both PICA origins are patent. There is mild to moderate irregularity and stenosis in the right V4 segment, better demonstrated on the post-contrast MRA neck images above (series 160151, image 8). No distal left vertebral artery stenosis. Patent vertebrobasilar junction. Tortuous basilar artery without stenosis. Normal SCA and PCA origins. Posterior communicating arteries are diminutive or absent. There is multifocal severe irregularity and stenosis in the left PCA P1 and P2 segments. However, there is preserved distal left PCA flow signal. There is moderate to severe stenosis in the distal right PCA, P3 segments. Antegrade flow in both ICA siphons. No siphon stenosis. Normal ophthalmic artery origins. Patent carotid termini. Normal MCA and ACA origins. Visible bilateral ACA branches are within normal limits. Bilateral MCA M1 segments and MCA bifurcations appear patent without stenosis. Visible bilateral MCA branches are within normal limits. IMPRESSION: 1. Positive for scattered acute infarcts in the Left PCA territory with petechial hemorrhage. 2. Superimposed small subacute appearing infarct with hemosiderin in the superior left occipital lobe (series 8, image 17) with questionable associated trace left posterior convexity Subdural Hematoma. Recommend repeat noncontrast Head CT with attention to this area. 3. MRA positive for multifocal severe stenosis in the left PCA, but no large vessel occlusion. 4. Neck MRA reveals tortuous cervical carotid and vertebral arteries with atherosclerosis but no stenosis. 5. Head MRA reveals intracranial atherosclerosis with up to moderate stenosis of the dominant distal right vertebral artery, and severe stenosis the right PCA P3 division. 6. Aside from #1 and #2 stable MRI appearance of the brain since 08/11/2017. Electronically Signed: By: Genevie Ann M.D. On: 09/06/2017 15:23   Mr Jeri Cos ZO  Contrast  Addendum Date: 09/06/2017   ADDENDUM REPORT: 09/06/2017 15:40 ADDENDUM: Study discussed by telephone with Dr. [name] on 09/06/2017 at 1528 hours. I advised her that the thin rim of FLAIR hyperintensity along the posterior left occipital lobe (corresponding to the questionable small SDH in #2) might be artifact due to hyper-oxygenation of the patient in the setting of general anesthesia under which this study was performed. Electronically Signed   By: Genevie Ann M.D.   On: 09/06/2017 15:40   Result Date: 09/06/2017 CLINICAL DATA:  67 year old female who presented with acute onset right extremity numbness on 09/04/2017. Recent loss of vision in the right eye, improving since onset, for which MRI evaluation was performed earlier this month. EXAM: MRI HEAD WITHOUT AND WITH CONTRAST MRA HEAD WITHOUT CONTRAST MRA NECK WITHOUT AND WITH CONTRAST TECHNIQUE: Multiplanar, multiecho pulse sequences of the brain and surrounding structures were obtained without and with intravenous contrast. Angiographic images of the Circle of Willis were obtained using MRA technique without  intravenous contrast. Angiographic images of the neck were obtained using MRA technique without and with intravenous contrast. Carotid stenosis measurements (when applicable) are obtained utilizing NASCET criteria, using the distal internal carotid diameter as the denominator. CONTRAST:  43mL MULTIHANCE GADOBENATE DIMEGLUMINE 529 MG/ML IV SOLN COMPARISON:  Brain and orbit MRI without and with contrast 08/11/2017. FINDINGS: MRI HEAD FINDINGS Brain: New since the 08/11/2017 comparison there are scattered mostly small areas of restricted diffusion in the left PCA territory, including the occipital pole, about the calcarine sulcus, in the left thalamus, the left splenium, and also affecting the tail of the left hippocampus (series 3, image 24). Associated T2 and FLAIR hyperintensity in the areas of abnormal diffusion. There is associated petechial  hemorrhage in the left occipital lobe. No associated mass effect. Trace associated post ischemic enhancement, primarily at the left occipital pole. Furthermore, there is a small cortically based area of mixed appearing diffusion signal at the junction of the superior left occipital and parietal lobes along the posterior parieto-occipital sulcus as seen on series 3, image 32. This has T2 and FLAIR hyperintensity with new cortical hemosiderin from the recent MRI (series 9, image 67), and questionable trace posterior convexity left subdural blood (series 8, image 17). No definite extra-axial hemorrhage elsewhere. No intraventricular hemorrhage or ventriculomegaly. No contralateral right hemisphere or posterior fossa restricted diffusion. Stable gray and white matter signal elsewhere with patchy bilateral cerebral white matter T2 and FLAIR hyperintensity. No ventriculomegaly. Normal basilar cisterns. Negative pituitary and cervicomedullary junction. No dural thickening. No other abnormal intracranial enhancement; small developmental venous anomaly in the inferior right basal ganglia redemonstrated (normal variant series 17, image 19). Vascular: Major intracranial vascular flow voids are stable. Mild intracranial artery tortuosity. The distal right vertebral artery appears dominant. Skull and upper cervical spine: Negative visualized cervical spine. Normal bone marrow signal. Sinuses/Orbits: Stable and negative. Other: Fluid layering in the pharynx. The patient appears to be intubated currently. Visible internal auditory structures appear normal. Mastoids remain clear. Negative scalp soft tissues. MRA NECK FINDINGS Precontrast time-of-flight images reveal antegrade flow in both carotid and vertebral arteries throughout the neck and to the skull base. Post-contrast neck MRA images reveal a 3 vessel arch configuration. No great vessel origin stenosis. Normal right CCA aside from mild tortuosity. There is mild irregularity  at the right ICA origin and bulb compatible with atherosclerosis, but no associated stenosis. There is tortuosity of the cervical right ICA just distal to the bulb with a mildly kinked appearance, but otherwise no cervical right ICA stenosis. Negative left CCA aside from tortuosity. There is moderate irregularity at the left carotid bifurcation in keeping with atherosclerosis. Incidental stenosis of the left ECA origin is noted. No significant proximal left ICA stenosis. Tortuosity of the ICA just distal to the bulb with a kinked appearance similar to that on the left. No other cervical left ICA stenosis. Tortuous bilateral vertebral arteries in the neck. The right is mildly dominant. No stenosis at the vertebral artery origins or elsewhere proximal to the skull base. MRA HEAD FINDINGS Tortuous distal vertebral arteries, the right is dominant. Both PICA origins are patent. There is mild to moderate irregularity and stenosis in the right V4 segment, better demonstrated on the post-contrast MRA neck images above (series 160151, image 8). No distal left vertebral artery stenosis. Patent vertebrobasilar junction. Tortuous basilar artery without stenosis. Normal SCA and PCA origins. Posterior communicating arteries are diminutive or absent. There is multifocal severe irregularity and stenosis in the left PCA P1 and  P2 segments. However, there is preserved distal left PCA flow signal. There is moderate to severe stenosis in the distal right PCA, P3 segments. Antegrade flow in both ICA siphons. No siphon stenosis. Normal ophthalmic artery origins. Patent carotid termini. Normal MCA and ACA origins. Visible bilateral ACA branches are within normal limits. Bilateral MCA M1 segments and MCA bifurcations appear patent without stenosis. Visible bilateral MCA branches are within normal limits. IMPRESSION: 1. Positive for scattered acute infarcts in the Left PCA territory with petechial hemorrhage. 2. Superimposed small subacute  appearing infarct with hemosiderin in the superior left occipital lobe (series 8, image 17) with questionable associated trace left posterior convexity Subdural Hematoma. Recommend repeat noncontrast Head CT with attention to this area. 3. MRA positive for multifocal severe stenosis in the left PCA, but no large vessel occlusion. 4. Neck MRA reveals tortuous cervical carotid and vertebral arteries with atherosclerosis but no stenosis. 5. Head MRA reveals intracranial atherosclerosis with up to moderate stenosis of the dominant distal right vertebral artery, and severe stenosis the right PCA P3 division. 6. Aside from #1 and #2 stable MRI appearance of the brain since 08/11/2017. Electronically Signed: By: Genevie Ann M.D. On: 09/06/2017 15:23   Mr [name] Head Wo Contrast  Addendum Date: 09/06/2017   ADDENDUM REPORT: 09/06/2017 15:40 ADDENDUM: Study discussed by telephone with Dr. [name] on 09/06/2017 at 1528 hours. I advised her that the thin rim of FLAIR hyperintensity along the posterior left occipital lobe (corresponding to the questionable small SDH in #2) might be artifact due to hyper-oxygenation of the patient in the setting of general anesthesia under which this study was performed. Electronically Signed   By: Genevie Ann M.D.   On: 09/06/2017 15:40   Result Date: 09/06/2017 CLINICAL DATA:  67 year old female who presented with acute onset right extremity numbness on 09/04/2017. Recent loss of vision in the right eye, improving since onset, for which MRI evaluation was performed earlier this month. EXAM: MRI HEAD WITHOUT AND WITH CONTRAST MRA HEAD WITHOUT CONTRAST MRA NECK WITHOUT AND WITH CONTRAST TECHNIQUE: Multiplanar, multiecho pulse sequences of the brain and surrounding structures were obtained without and with intravenous contrast. Angiographic images of the Circle of Willis were obtained using MRA technique without intravenous contrast. Angiographic images of the neck were obtained using MRA technique  without and with intravenous contrast. Carotid stenosis measurements (when applicable) are obtained utilizing NASCET criteria, using the distal internal carotid diameter as the denominator. CONTRAST:  96mL MULTIHANCE GADOBENATE DIMEGLUMINE 529 MG/ML IV SOLN COMPARISON:  Brain and orbit MRI without and with contrast 08/11/2017. FINDINGS: MRI HEAD FINDINGS Brain: New since the 08/11/2017 comparison there are scattered mostly small areas of restricted diffusion in the left PCA territory, including the occipital pole, about the calcarine sulcus, in the left thalamus, the left splenium, and also affecting the tail of the left hippocampus (series 3, image 24). Associated T2 and FLAIR hyperintensity in the areas of abnormal diffusion. There is associated petechial hemorrhage in the left occipital lobe. No associated mass effect. Trace associated post ischemic enhancement, primarily at the left occipital pole. Furthermore, there is a small cortically based area of mixed appearing diffusion signal at the junction of the superior left occipital and parietal lobes along the posterior parieto-occipital sulcus as seen on series 3, image 32. This has T2 and FLAIR hyperintensity with new cortical hemosiderin from the recent MRI (series 9, image 67), and questionable trace posterior convexity left subdural blood (series 8, image 17). No definite extra-axial hemorrhage elsewhere.  No intraventricular hemorrhage or ventriculomegaly. No contralateral right hemisphere or posterior fossa restricted diffusion. Stable gray and white matter signal elsewhere with patchy bilateral cerebral white matter T2 and FLAIR hyperintensity. No ventriculomegaly. Normal basilar cisterns. Negative pituitary and cervicomedullary junction. No dural thickening. No other abnormal intracranial enhancement; small developmental venous anomaly in the inferior right basal ganglia redemonstrated (normal variant series 17, image 19). Vascular: Major intracranial  vascular flow voids are stable. Mild intracranial artery tortuosity. The distal right vertebral artery appears dominant. Skull and upper cervical spine: Negative visualized cervical spine. Normal bone marrow signal. Sinuses/Orbits: Stable and negative. Other: Fluid layering in the pharynx. The patient appears to be intubated currently. Visible internal auditory structures appear normal. Mastoids remain clear. Negative scalp soft tissues. MRA NECK FINDINGS Precontrast time-of-flight images reveal antegrade flow in both carotid and vertebral arteries throughout the neck and to the skull base. Post-contrast neck MRA images reveal a 3 vessel arch configuration. No great vessel origin stenosis. Normal right CCA aside from mild tortuosity. There is mild irregularity at the right ICA origin and bulb compatible with atherosclerosis, but no associated stenosis. There is tortuosity of the cervical right ICA just distal to the bulb with a mildly kinked appearance, but otherwise no cervical right ICA stenosis. Negative left CCA aside from tortuosity. There is moderate irregularity at the left carotid bifurcation in keeping with atherosclerosis. Incidental stenosis of the left ECA origin is noted. No significant proximal left ICA stenosis. Tortuosity of the ICA just distal to the bulb with a kinked appearance similar to that on the left. No other cervical left ICA stenosis. Tortuous bilateral vertebral arteries in the neck. The right is mildly dominant. No stenosis at the vertebral artery origins or elsewhere proximal to the skull base. MRA HEAD FINDINGS Tortuous distal vertebral arteries, the right is dominant. Both PICA origins are patent. There is mild to moderate irregularity and stenosis in the right V4 segment, better demonstrated on the post-contrast MRA neck images above (series [id number], image 8). No distal left vertebral artery stenosis. Patent vertebrobasilar junction. Tortuous basilar artery without stenosis. Normal  SCA and PCA origins. Posterior communicating arteries are diminutive or absent. There is multifocal severe irregularity and stenosis in the left PCA P1 and P2 segments. However, there is preserved distal left PCA flow signal. There is moderate to severe stenosis in the distal right PCA, P3 segments. Antegrade flow in both ICA siphons. No siphon stenosis. Normal ophthalmic artery origins. Patent carotid termini. Normal MCA and ACA origins. Visible bilateral ACA branches are within normal limits. Bilateral MCA M1 segments and MCA bifurcations appear patent without stenosis. Visible bilateral MCA branches are within normal limits. IMPRESSION: 1. Positive for scattered acute infarcts in the Left PCA territory with petechial hemorrhage. 2. Superimposed small subacute appearing infarct with hemosiderin in the superior left occipital lobe (series 8, image 17) with questionable associated trace left posterior convexity Subdural Hematoma. Recommend repeat noncontrast Head CT with attention to this area. 3. MRA positive for multifocal severe stenosis in the left PCA, but no large vessel occlusion. 4. Neck MRA reveals tortuous cervical carotid and vertebral arteries with atherosclerosis but no stenosis. 5. Head MRA reveals intracranial atherosclerosis with up to moderate stenosis of the dominant distal right vertebral artery, and severe stenosis the right PCA P3 division. 6. Aside from #1 and #2 stable MRI appearance of the brain since 08/11/2017. Electronically Signed: By: Genevie Ann M.D. On: 09/06/2017 15:23        Scheduled Meds: . aspirin  325 mg Oral Daily  . atorvastatin  80 mg Oral q1800  . diphenhydrAMINE  25 mg Oral QHS  . latanoprost  1 drop Both Eyes QHS  . prednisoLONE  5 mg Oral Q supper  . predniSONE  4 mg Oral Q supper  . timolol  1 drop Both Eyes Daily   Continuous Infusions: . lactated ringers 10 mL/hr at 09/06/17 1111     LOS: 1 day    Time spent: 35 min    Geradine Girt, DO [facility] Pager [phone]  If 7PM-7AM, please contact night-coverage [email or web address] Password [facility] 09/06/2017, 4:12 PM

## 2017-09-06 NOTE — Anesthesia Preprocedure Evaluation (Signed)
Anesthesia Evaluation  Patient identified by MRN, date of birth, ID band Patient awake    Reviewed: Allergy & Precautions, NPO status , Patient's Chart, lab work & pertinent test results  History of Anesthesia Complications Negative for: history of anesthetic complications  Airway Mallampati: II  TM Distance: >3 FB Neck ROM: Full    Dental  (+) Missing, Dental Advisory Given, Chipped   Pulmonary COPD, former smoker,    breath sounds clear to auscultation       Cardiovascular hypertension, Pt. on medications (-) angina Rhythm:Regular Rate:Normal     Neuro/Psych  Neuromuscular disease    GI/Hepatic Neg liver ROS, GERD  Controlled,  Endo/Other  diabetes, Type 2  Renal/GU negative Renal ROS     Musculoskeletal  (+) Arthritis  (polyarthritis rheumatica: steroids), Fibromyalgia -  Abdominal   Peds  Hematology negative hematology ROS (+)   Anesthesia Other Findings   Reproductive/Obstetrics                             Anesthesia Physical  Anesthesia Plan  ASA: III  Anesthesia Plan: General   Post-op Pain Management:    Induction:   PONV Risk Score and Plan: 3 and Ondansetron, Dexamethasone, Midazolam and Treatment may vary due to age or medical condition  Airway Management Planned: Oral ETT  Additional Equipment:   Intra-op Plan:   Post-operative Plan: Extubation in OR  Informed Consent: I have reviewed the patients History and Physical, chart, labs and discussed the procedure including the risks, benefits and alternatives for the proposed anesthesia with the patient or authorized representative who has indicated his/her understanding and acceptance.   Dental advisory given  Plan Discussed with: CRNA and Anesthesiologist  Anesthesia Plan Comments:         Anesthesia Quick Evaluation

## 2017-09-06 NOTE — Transfer of Care (Signed)
Immediate Anesthesia Transfer of Care Note  Patient: Misty Morris  Procedure(s) Performed: MRI OF BRAIN (N/A )  Patient Location: PACU  Anesthesia Type:General  Level of Consciousness: awake, alert , oriented and patient cooperative  Airway & Oxygen Therapy: Patient Spontanous Breathing and Patient connected to nasal cannula oxygen  Post-op Assessment: Report given to RN and Post -op Vital signs reviewed and stable  Post vital signs: Reviewed and stable  Last Vitals:  Vitals:   09/06/17 0925 09/06/17 1453  BP: (!) 188/78   Pulse: 72   Resp: 18   Temp: 36.9 C (P) 36.8 C  SpO2: 98%     Last Pain:  Vitals:   09/06/17 0925  TempSrc: Oral  PainSc:          Complications: No apparent anesthesia complications

## 2017-09-06 NOTE — Progress Notes (Signed)
OT Cancellation    09/06/17 1600  OT Visit Information  Last OT Received On 09/06/17  Reason Eval/Treat Not Completed Other (comment) Pt still feels "groggy" from anesthesia and would like to wait till tomorrow for OT session. Will return tomorrow for OT treatment to address vision during ADLs.   Calvin, OTR/L Acute Rehab Pager: [phone] Office: [phone]

## 2017-09-07 ENCOUNTER — Inpatient Hospital Stay (HOSPITAL_COMMUNITY): Payer: PPO

## 2017-09-07 ENCOUNTER — Encounter (HOSPITAL_COMMUNITY): Payer: Self-pay | Admitting: Radiology

## 2017-09-07 DIAGNOSIS — I361 Nonrheumatic tricuspid (valve) insufficiency: Secondary | ICD-10-CM

## 2017-09-07 LAB — GLUCOSE, CAPILLARY
Glucose-Capillary: 211 mg/dL — ABNORMAL HIGH (ref 65–99)
Glucose-Capillary: 156 mg/dL — ABNORMAL HIGH (ref 65–99)

## 2017-09-07 LAB — ECHOCARDIOGRAM COMPLETE
Height: 62.5 in
Weight: 2496 oz

## 2017-09-07 MED ORDER — ATORVASTATIN CALCIUM 80 MG PO TABS: 80.0000 mg | ORAL_TABLET | Freq: Every day | ORAL | 0 refills | Status: DC

## 2017-09-07 MED ORDER — AMLODIPINE BESYLATE 5 MG PO TABS: 5.0000 mg | ORAL_TABLET | Freq: Every evening | ORAL | Status: DC

## 2017-09-07 MED ORDER — ASPIRIN 325 MG PO TABS: 325.0000 mg | ORAL_TABLET | Freq: Every day | ORAL | 0 refills | Status: DC

## 2017-09-07 MED ORDER — CLOPIDOGREL BISULFATE 75 MG PO TABS: 75.0000 mg | ORAL_TABLET | Freq: Every day | ORAL | 0 refills | Status: DC

## 2017-09-07 NOTE — Progress Notes (Signed)
Await repeat CT head and echo to be done and read before patient can be d/c'd. Eulogio Bear DO

## 2017-09-07 NOTE — Consult Note (Signed)
[facility] CM Primary Care Navigator  09/07/2017  Misty Morris 04-02-1951 [id number]   Met with patient at the bedside to identify possible discharge needs. Patient reportshavingright-sided numbness/ weakness and visual impediment thatresultedto this admission.  Patient endorses Dr. Donnie Coffin with [facility] as herprimary care provider.   [facility] [address] [facility] so [facility].   Patient states managing herownmedications at home using "pill box" system filledevery 2 weeks.  According to patient, she was driving prior to admission, however, her friends Tye [place] and Remo Lipps) will providetransportation to herdoctors'appointments after discharge or will use transportation company [employer]) that she had been using previously.  Patient lives at home alone. She states that her next door neighbors (Vicky and Estil Daft) will be able to assist with her needs at [facility] discharge.   Anticipated discharge plan is homewith Outpatient therapy [facility])per patient.  Patientvoiced understanding to call primary care provider's office for a post discharge follow-up appointment within 1-2 weeks or sooner if needs arise.Patient letter (with PCP's contact number) was provided as a reminder.  Explained to patient regarding [facility] CM services available for health management and resources at home butshe verbalized having no currentneeds or concerns at thispoint and states that she is managing herself so far. Patient is knowledgeable with managing her diabetes (patient had been in nursing school). She reports being on prednisone that is causing her blood sugar to be elevated.  Patient is aware to follow-up with provider regarding elevation in blood sugar.   Patient voiced understandingto seek referral from primary care provider to [facility] care management if necessary  and appropriate for services in the near future.  [facility] care management information provided for future needs that may arise.  Patient however, verbally agreed and opted for EMMI stroke calls to follow-upwithrecovery at home.  Referral made forEMMIStrokecalls after discharge.   For additional questions please contact:  Edwena Felty A. Hunt Zajicek, BSN, RN-BC [facility] PRIMARY CARE Navigator Cell: [phone]

## 2017-09-07 NOTE — Progress Notes (Signed)
*  PRELIMINARY RESULTS* Echocardiogram 2D Echocardiogram has been performed.  Leavy Cella 09/07/2017, 9:53 AM

## 2017-09-07 NOTE — Discharge Summary (Signed)
Physician Discharge Summary  Misty Morris OJJ:[id number] DOB: July 30, 1951 DOA: 09/04/2017  PCP: Misty Morris, Misty Sa, MD  Admit date: 09/04/2017 Discharge date: 09/07/2017   Recommendations for Outpatient Follow-Up:   1. Education continuing on benefits of statin and treatment of DM-- refusing medications 2. Referral for 30 day event monitor to r/o a fib 3. Repeat head CT in 3-4 weeks 4. ASA/plavix x 3 months then plavix alone   Discharge Diagnosis:   Principal Problem:   Stroke Misty Morris) Active Problems:   Hypertension complicating diabetes ([place])   Diabetes mellitus ([place])   Polymyalgia rheumatica ([place])   Fibromyalgia   Hyperlipidemia   Ischemic optic neuropathy of right eye   Discharge disposition:  Home.  Discharge Condition: Improved.  Diet recommendation: Low sodium, heart healthy.  Carbohydrate-modified  Wound care: None.   History of Present Illness:   Misty Morris is a 67 y.o. female with medical history significant of diabetes type 2 diet-controlled, hypertension, hyperlipidemia, fibromyalgia, and polymyalgia rheumatica who presents with complaints of right-sided numbness.  Patient awoke at 4 AM and was normal as best she could tell but she was somewhat drowsy.  When she awoke later in the morning she had some numbness and weakness of her right arm and hand and some right leg weakness.  She had a slight left facial droop on presentation to the emergency department which appears to have resolved.  She denies any noncompliance with medications.  She took her medications this morning.  She is followed primarily by Misty Morris for her diabetes and hypertension and by Misty Morris from rheumatology for her polymyalgia rheumatica and fibromyalgia.  She has not had any changes in her medications.  Presentation to the emergency department her blood pressure is elevated.  Patient reports never having had symptoms like this before.  She denies any vision changes.  Had no fever,  vomiting, diarrhea, or recent illness.  She has no personal or family history of stroke.  She does have several risk factors as noted above.  NIH stroke scale based on evaluation is 2.  She is a very good historian.  The patient was seen in the emergency department by Misty Morris from neurology who feels that she should be admitted and she will need an MRI with full sedation.  She had an MRI earlier this month and needed to have anesthesia sedation was unable to undergo the MRI even with benzodiazepine treatment.  This was done for visual blindness appears to be improving.     Morris Course by Problem:     CVA- suspect small vessel disease -resolving per patient -telemetry -MRI of the head with full sedation:1. Positive for scattered acute infarcts in the Left PCA territory with petechial hemorrhage. 2. Superimposed small subacute appearing infarct with hemosiderin in the superior left occipital lobe (series 8, image 17) with questionable associated trace left posterior convexity Subdural Hematoma. Recommend repeat noncontrast Head CT with attention to this area. 3. MRA positive for multifocal severe stenosis in the left PCA, but no large vessel occlusion. 4. Neck MRA reveals tortuous cervical carotid and vertebral arteries with atherosclerosis but no stenosis. 5. Head MRA reveals intracranial atherosclerosis with up to moderate stenosis of the dominant distal right vertebral artery, and severe stenosis the right PCA P3 division. 6. Aside from #1 and #2 stable MRI appearance of the brain since 08/11/2017. -echo:- Left ventricle: The cavity size was normal. There was mild   concentric hypertrophy. Systolic function was vigorous. The   estimated  ejection fraction was in the range of 65% to 70%. Wall   motion was normal; there were no regional wall motion   abnormalities. Left ventricular diastolic function parameters   were normal. Doppler parameters are consistent with high    ventricular filling pressure. - Aortic valve: Trileaflet; mildly thickened, mildly calcified   leaflets. - Tricuspid valve: There was mild regurgitation. 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  Fibromyalgia:  -Continue home treatment regimen. Follow-up with Misty Morris.  Hypokalemia -repleted       Medical Consultants:    neuro   Discharge Exam:   Vitals:   09/07/17 0547 09/07/17 0955  BP: (!) 169/83 (!) 156/83  Pulse: 83 72  Resp: 18 20  Temp: 97.9 F (36.6 C) 98.1 F (36.7 C)  SpO2: 97% 98%   Vitals:   09/06/17 2200 09/07/17 0200 09/07/17 0547 09/07/17 0955  BP: 133/75 (!) 128/55 (!) 169/83 (!) 156/83  Pulse: (!) 108 80 83 72  Resp: 18 18 18 20   Temp: 98.7 F (37.1 C) 98 F (36.7 C) 97.9 F (36.6 C) 98.1 F (36.7 C)  TempSrc: Oral Oral Oral Oral  SpO2: 96% 97% 97% 98%  Weight:      Height:        Gen:  NAD    The results of significant diagnostics from this hospitalization (including imaging, microbiology, ancillary and laboratory) are listed below for reference.     Procedures and Diagnostic Studies:   Ct Head Wo Contrast  Result Date: 09/04/2017 CLINICAL DATA:  Numbness of the right arm and leg beginning last night. EXAM: CT HEAD WITHOUT CONTRAST TECHNIQUE: Contiguous axial images were obtained from the base of the skull through the vertex without intravenous contrast.  COMPARISON:  MRI 08/11/2017 FINDINGS: Brain: The there chronic small-vessel ischemic changes of the hemispheric white matter. No sign of acute infarction. No cortical or large vessel territory infarction. No mass lesion, hemorrhage, hydrocephalus or extra-axial collection. Vascular: There is atherosclerotic calcification of the major vessels at the base of the brain. Skull: Calvarium is normal. Sinuses/Orbits: Clear/normal. Other: Previous maxillary surgery. IMPRESSION: No acute finding by CT. Chronic small-vessel ischemic changes of the hemispheric white matter. Electronically Signed   By: [name] M.D.   On: 09/04/2017 12:27     Labs:   Basic Metabolic Panel: Recent Labs  Lab 09/04/17 1048 09/04/17 1102 09/06/17 0531  NA 138 141 140  K 3.3* 3.3* 3.6  CL 102 102 106  CO2 22  --  24  GLUCOSE 236* 234* 204*  BUN 11 12 11   CREATININE 0.83 0.70 0.74  CALCIUM 9.3  --  9.1   GFR Estimated Creatinine Clearance: 64.5 mL/min (by C-G formula based on SCr of 0.74 mg/dL). Liver Function Tests: Recent Labs  Lab 09/04/17 1048  AST 18  ALT 19  ALKPHOS 84  BILITOT 0.7  PROT 7.0  ALBUMIN 4.2   No results for input(s): LIPASE, AMYLASE in the last 168 hours. No results for input(s): AMMONIA in the last 168 hours. Coagulation profile Recent Labs  Lab 09/04/17 1048  INR 0.93    CBC: Recent Labs  Lab 09/04/17 1048 09/04/17 1102 09/06/17 0531  WBC 10.3  --  9.1  NEUTROABS 6.6  --   --   HGB 14.5 15.0 12.8  HCT 44.0 44.0 39.5  MCV 84.0  --  84.2  PLT 308  --  293   Cardiac Enzymes: No results for input(s): CKTOTAL, CKMB, CKMBINDEX, TROPONINI in the last 168 hours. BNP: Invalid input(s): POCBNP CBG: Recent Labs  Lab 09/06/17 1503 09/06/17 1624 09/06/17 2153 09/07/17 0613 09/07/17 1141  GLUCAP 141* 191* 390* 211* 156*   D-Dimer No results for input(s): DDIMER in the last 72 hours. Hgb A1c No results for input(s): HGBA1C in the last 72 hours. Lipid Profile Recent  Labs    09/05/17 0458  CHOL 251*  HDL 42  LDLCALC 143*  TRIG 331*  CHOLHDL 6.0   Thyroid function studies Recent Labs    09/05/17 0927  TSH 0.649   Anemia work up No results for input(s): VITAMINB12, FOLATE, FERRITIN, TIBC, IRON, RETICCTPCT in the last 72 hours. Microbiology No results found for this or any previous visit (from the past 240 hour(s)).   Discharge Instructions:   Discharge Instructions    Ambulatory referral to Neurology   Complete by:  As directed    Follow up with stroke clinic Cecille Rubin preferred, if not available, then consider Caesar Chestnut, [facility] or Jaynee Eagles whoever is available) at [facility] in about 6-8 weeks. Thanks.   Diet - low sodium heart healthy   Complete by:  As directed    Diet Carb Modified   Complete by:  As directed    Discharge instructions   Complete by:  As directed    Monitor blood sugars-- need to control better to reduce your chances of having another stroke   Increase activity slowly   Complete by:  As directed      Allergies as of 09/07/2017      Reactions   Codeine Hives   Demerol [meperidine] Hives   Latex Dermatitis, Other (See Comments)   Blistering   Morphine And Related Hives   Sulfa Antibiotics Hives   Tramadol Hives   Vicodin [hydrocodone-acetaminophen] Itching   Elevated blood pressure. Tolerates low doses.      Medication List    TAKE these medications   Acetylcarn-Alpha Lipoic Acid 400-200 MG Caps Take 1 capsule by mouth 2 (two) times daily.   amLODipine 5 MG tablet Commonly known as:  NORVASC Take 5 mg by mouth every evening.   aspirin 325 MG tablet Take 1 tablet (325 mg total) by mouth daily. Start taking on:  09/08/2017   atorvastatin 80 MG tablet Commonly known as:  LIPITOR Take 1 tablet (80 mg total) by mouth daily at 6 PM.   Biotin 1000 MCG tablet Take 1,000 mcg by mouth every evening.   CALCIUM 1000 + D PO Take 1 tablet by mouth every evening.   Chromium Picolinate 200 MCG Caps Take 200  mcg by mouth daily.   clopidogrel 75 MG tablet Commonly known as:  PLAVIX Take 1 tablet (75 mg total) by mouth daily. Start taking on:  09/08/2017   CoQ-10 100 MG Caps Take 100 mg by mouth daily.   diphenhydrAMINE 25 MG tablet Commonly known as:  BENADRYL Take 25 mg by mouth at bedtime.   Fish Oil 1200 MG Caps Take 1,200 mg 2 (two) times daily by mouth.   Lysine 500 MG Caps Take 500 mg  by mouth every evening.   prednisoLONE 5 MG Tabs tablet Take 5 mg by mouth every evening. Take with 4 mg prednisone to add up to 9mg s daily   predniSONE 1 MG tablet Commonly known as:  DELTASONE Take 4 mg by mouth every evening. Take with 5mg s, add up to 9mg s daily   TIMOPTIC OCUDOSE 0.5 % Soln Generic drug:  Timolol Maleate PF Place 1 drop into both eyes daily. Preservative free only   Vitamin D 2000 units Caps Take 4,000 Units by mouth daily.   ZIOPTAN 0.0015 % Soln Generic drug:  Tafluprost Place 1 drop into both eyes at bedtime.      Follow-up Information    Dennie Bible, NP. Schedule an appointment as soon as possible for a visit in 6 week(s).   Specialty:  Family Medicine Contact information: [address] [place] [address] [phone]        Misty Morris, Misty Sa, MD Follow up in 1 week(s).   Specialty:  Family Medicine Contact information: [address] [place] [address] [phone]            Time coordinating discharge: 35 min  Signed:  Geradine Girt   [facility] 09/07/2017, 12:59 PM

## 2017-09-07 NOTE — Care Management Note (Signed)
Case Management Note  Patient Details  Name: Misty Morris MRN: [id number] Date of Birth: 15-Mar-1951  Subjective/Objective:                    Action/Plan: Pt discharging home with self care. CM consulted for outpatient rehab. CM met with the patient and she would like to attend [facility] Orders in Epic and information on the AVS.  Pt has transportation home today.   Expected Discharge Date:  09/07/17               Expected Discharge Plan:  OP Rehab  In-House Referral:     Discharge planning Services  CM Consult  Post Acute Care Choice:    Choice offered to:     DME Arranged:    DME Agency:     HH Arranged:    HH Agency:     Status of Service:  Completed, signed off  If discussed at [employer] of Stay Meetings, dates discussed:    Additional Comments:  Pollie Friar, RN 09/07/2017, 1:15 PM

## 2017-09-07 NOTE — Progress Notes (Signed)
Discharge instructions given. Pt verbalized understanding and all questions were answered.  

## 2017-09-14 DIAGNOSIS — E782 Mixed hyperlipidemia: Secondary | ICD-10-CM | POA: Diagnosis not present

## 2017-09-14 DIAGNOSIS — E119 Type 2 diabetes mellitus without complications: Secondary | ICD-10-CM | POA: Diagnosis not present

## 2017-09-14 DIAGNOSIS — I10 Essential (primary) hypertension: Secondary | ICD-10-CM | POA: Diagnosis not present

## 2017-09-14 DIAGNOSIS — I639 Cerebral infarction, unspecified: Secondary | ICD-10-CM | POA: Diagnosis not present

## 2017-09-16 DIAGNOSIS — H401132 Primary open-angle glaucoma, bilateral, moderate stage: Secondary | ICD-10-CM | POA: Diagnosis not present

## 2017-09-26 ENCOUNTER — Other Ambulatory Visit: Payer: Self-pay

## 2017-09-26 ENCOUNTER — Ambulatory Visit: Payer: PPO | Attending: Internal Medicine | Admitting: Rehabilitation

## 2017-09-26 ENCOUNTER — Encounter: Payer: Self-pay | Admitting: Rehabilitation

## 2017-09-26 ENCOUNTER — Encounter: Payer: Self-pay | Admitting: Occupational Therapy

## 2017-09-26 ENCOUNTER — Ambulatory Visit: Payer: PPO | Admitting: Occupational Therapy

## 2017-09-26 DIAGNOSIS — R2689 Other abnormalities of gait and mobility: Secondary | ICD-10-CM | POA: Diagnosis not present

## 2017-09-26 DIAGNOSIS — R2681 Unsteadiness on feet: Secondary | ICD-10-CM

## 2017-09-26 DIAGNOSIS — R41842 Visuospatial deficit: Secondary | ICD-10-CM

## 2017-09-26 NOTE — Therapy (Signed)
[facility] [address] [place], [place], [address] Phone: [phone]   Fax:  [phone]  Physical Therapy Evaluation  Patient Details  Name: SAMYRAH BRUSTER MRN: [id number] Date of Birth: 05-26-1951 Referring Provider: Donnie Coffin, MD   Encounter Date: 09/26/2017  PT End of Session - 09/26/17 2059    Visit Number  1    Number of Visits  5    Date for PT Re-Evaluation  10/26/17    Authorization Type  [employer] [facility]    PT Start Time  7353    PT Stop Time  1530    PT Time Calculation (min)  42 min    Activity Tolerance  Patient tolerated treatment well    Behavior During Therapy  [facility] for tasks assessed/performed       Past Medical History:  Diagnosis Date  . Diabetes mellitus without complication (HCC)    diet controlled  . Fibromyalgia   . GERD (gastroesophageal reflux disease)   . Hypercholesterolemia   . Hypertension   . Polyarthritis rheumatica (HCC)    RA  . Shingles   . Vision impairment    right eye    Past Surgical History:  Procedure Laterality Date  . ABDOMINAL HYSTERECTOMY    . [place] 1  1980's  . PARTIAL KNEE ARTHROPLASTY Left 01/17/2017   Procedure: UNICOMPARTMENTAL LEFT KNEE;  Surgeon: Paralee Cancel, MD;  [facility]: [facility];  Service: Orthopedics;  Laterality: Left;  . RADIOLOGY WITH ANESTHESIA N/A 08/11/2017   Procedure: MRI OF BRAIN WITH AND WITHOUT CONSTRAST, AND OF THE ORBIT WITH AND WITHOUT CONTRAST;  Surgeon: Radiologist, Medication, MD;  Location: [place];  Service: Radiology;  Laterality: N/A;  . RADIOLOGY WITH ANESTHESIA N/A 09/06/2017   Procedure: MRI OF BRAIN;  Surgeon: Radiologist, Medication, MD;  Location: [place];  Service: Radiology;  Laterality: N/A;  . RHINOPLASTY    . TOTAL KNEE ARTHROPLASTY Right 09/30/2015   Procedure: TOTAL RIGHT KNEE ARTHROPLASTY;  Surgeon: Paralee Cancel, MD;  [facility];  Service: Orthopedics;  Laterality: Right;    There were no vitals  filed for this visit.   Subjective Assessment - 09/26/17 1454    Subjective  Pt is s/p CVA on 1/27.  She reports balance is not normal and she can't be as "speedy" as she would like to be.      Limitations  Walking    Patient Stated Goals  "to be more balance"    Currently in Pain?  No/denies         [facility] PT Assessment - 09/26/17 1455      Assessment   Medical Diagnosis  CVA    Referring Provider  Donnie Coffin, MD    Hand Dominance  Right    Prior Therapy  hospitalied 09/04/17-09/07/17      Precautions   Precautions  Fall      Balance Screen   Has the patient fallen in the past 6 months  No    Has the patient had a decrease in activity level because of a fear of falling?   No    Is the patient reluctant to leave their home because of a fear of falling?   No      Home Social worker  Private residence    Living Arrangements  Alone    Available Help at Discharge  Neighbor;Available PRN/intermittently    Type of Home  House    Home Access  Stairs  to enter    Entrance Stairs-Number of Steps  2    Entrance Stairs-Rails  Left    Home Layout  One level    [place] - 2 wheels;Cane - single point;Shower seat      Prior Function   Level of Independence  Independent    Vocation  Retired    Leisure  reading, shopping, going to senior center      [employer]   Overall Cognitive Status  Within Functional Limits for tasks assessed      Sensation   Light Touch  Appears Intact    Additional Comments  numbness R side of neck/shoulder some numbness from previous knee surgeries      Coordination   Gross Motor Movements are Fluid and Coordinated  Yes    Fine Motor Movements are Fluid and Coordinated  Yes      ROM / Strength   AROM / PROM / Strength  Strength      Strength   Overall Strength  Within functional limits for tasks performed    Overall Strength Comments  grossly 4+/5 in BLEs      Transfers   Transfers  Sit to Stand;Stand to Sit     Sit to Stand  7: Independent    Stand to Sit  7: Independent      Ambulation/Gait   Ambulation/Gait  Yes    Ambulation/Gait Assistance  6: Modified independent (Device/Increase time)    Ambulation Distance (Feet)  400 Feet    Assistive device  None    Gait Pattern  Within Functional Limits    Ambulation Surface  Level;Indoor    Gait velocity  3.35 ft/sec     Stairs  Yes    Stairs Assistance  6: Modified independent (Device/Increase time)    Stair Management Technique  One rail Right;Alternating pattern;Forwards    Number of Stairs  4    Height of Stairs  6      Functional Gait  Assessment   Gait assessed   Yes    Gait Level Surface  Walks 20 ft in less than 7 sec but greater than 5.5 sec, uses assistive device, slower speed, mild gait deviations, or deviates 6-10 in outside of the 12 in walkway width.    Change in Gait Speed  Able to smoothly change walking speed without loss of balance or gait deviation. Deviate no more than 6 in outside of the 12 in walkway width.    Gait with Horizontal Head Turns  Performs head turns smoothly with no change in gait. Deviates no more than 6 in outside 12 in walkway width    Gait with Vertical Head Turns  Performs head turns with no change in gait. Deviates no more than 6 in outside 12 in walkway width.    Gait and Pivot Turn  Pivot turns safely within 3 sec and stops quickly with no loss of balance.    Step Over Obstacle  Is able to step over one shoe box (4.5 in total height) but must slow down and adjust steps to clear box safely. May require verbal cueing.    Gait with Narrow Base of Support  Ambulates 4-7 steps.    Gait with Eyes Closed  Walks 20 ft, uses assistive device, slower speed, mild gait deviations, deviates 6-10 in outside 12 in walkway width. Ambulates 20 ft in less than 9 sec but greater than 7 sec.    Ambulating Backwards  Walks 20 ft, uses assistive  device, slower speed, mild gait deviations, deviates 6-10 in outside 12 in walkway  width.    Steps  Alternating feet, must use rail.    Total Score  22    FGA comment:  19-24 = medium risk fall             Objective measurements completed on examination: See above findings.              PT Education - 09/26/17 1533    Education provided  Yes    Education Details  education on evaluation findings, POC, goals, initial HEP    Person(s) Educated  Patient    Methods  Explanation;Demonstration;Handout    Comprehension  Returned demonstration;Verbalized understanding       PT Short Term Goals - 09/26/17 1528      PT SHORT TERM GOAL #1   Title  =LTGs        PT Long Term Goals - 09/26/17 1528      PT LONG TERM GOAL #1   Title  Pt will be independent with HEP in order to indicate decreased fall risk.  (Target Date: 10/26/17)    Time  4    Period  Weeks    Status  New    Target Date  10/26/17      PT LONG TERM GOAL #2   Title  Pt will improve FGA to >/=25/30 in order to indicate decreased fall risk.     Time  4    Period  Weeks    Status  New      PT LONG TERM GOAL #3   Title  Pt will report return to balance exercise class at [facility] in order to indicate safe return to community fitness.     Time  4    Period  Weeks    Status  New      PT LONG TERM GOAL #4   Title  Pt will ambulate over varying outdoor surfaces 1000' at independent level in order to indicate safe return to community and leisure mobility.      Time  4    Period  Weeks    Status  New             Plan - 09/26/17 2100    Clinical Impression Statement  Pt presents s/p multiple small L PCA infarcts as well as occipital lobe infarct with resulting high level balance deficits as well as mild increase in visual deficits.  Note history of DM, HTN, HDL, and polymyalgia rhumatica that could impact progress in therapy.  Upon PT evaluation, note gait speed WFL and strength grossly WFL, however scored 22/30 on FGA, indicative of medium fall risk.  Pt will benefit  from skilled OP neuro PT to address high level balance deficits in anticipation for return to community fitness.     History and Personal Factors relevant to plan of care:  see above    Clinical Presentation  Stable    Clinical Presentation due to:  see above    Clinical Decision Making  Low    Rehab Potential  Excellent    PT Frequency  1x / week    PT Duration  4 weeks    PT Treatment/Interventions  ADLs/Self Care Home Management;Gait training;Stair training;Functional mobility training;Therapeutic activities;Therapeutic exercise;Balance training;Neuromuscular re-education;Patient/family education;Vestibular    PT Next Visit Plan  Check compliance with HEP, add as able, continue high level balance, outdoors gait as able    PT  Home Exercise Plan  see HEP initiated 09/26/17    Consulted and Agree with Plan of Care  Patient       Patient will benefit from skilled therapeutic intervention in order to improve the following deficits and impairments:  Decreased balance, Decreased mobility  Visit Diagnosis: Unsteadiness on feet  Other abnormalities of gait and mobility     Problem List Patient Active Problem List   Diagnosis Date Noted  . Hyperlipidemia   . Ischemic optic neuropathy of right eye   . Stroke ([place]) 09/04/2017  . Hypertension complicating diabetes ([place]) 09/04/2017  . Diabetes mellitus ([place]) 09/04/2017  . Polymyalgia rheumatica ([place]) 09/04/2017  . Fibromyalgia 09/04/2017  . S/P left UKR 01/17/2017  . Overweight (BMI 25.0-29.9) 10/01/2015  . S/P right TKA 09/30/2015    Cameron Sprang, PT, MPT [facility] [address] [place], [place], [address] Phone: [phone]   Fax:  [phone] 09/26/17, 9:05 PM  Name: KAO BERKHEIMER MRN: [id number] Date of Birth: 1951-04-05

## 2017-09-26 NOTE — Patient Instructions (Signed)
Stand in corner with chair in front of you for support.    Feet Together (Compliant Surface) Head Motion - Eyes Open    With eyes open, standing on compliant surface: __pillow______, feet together, move head slowly: up and down x 10 reps, side to side x 10 reps, and diagonally both directions x 10 reps.  Repeat ___1_ times per session. Do 1-2____ sessions per day.  Copyright  VHI. All rights reserved.    Feet Together (Compliant Surface) Arm Motion - Eyes Closed    Stand on compliant surface: ___pillow_____ with feet together. Close eyes and keep arms by your side.  Repeat __3__ times per session for 30 secs each. Do __1-2__ sessions per day.  Copyright  VHI. All rights reserved.

## 2017-09-26 NOTE — Therapy (Signed)
[facility] [address] [place], [place], [address] Phone: [phone]   Fax:  [phone]  Occupational Therapy Evaluation  Patient Details  Name: Misty Morris MRN: [id number] Date of Birth: [birth year] Referring Provider: Donnie Coffin, MD   Encounter Date: 09/26/2017  OT End of Session - 09/26/17 1720    Visit Number  1    Number of Visits  1    Date for OT Re-Evaluation  -- eval only    Authorization Type  [employer]; eval only    OT Start Time  1403    OT Stop Time  1445    OT Time Calculation (min)  42 min    Activity Tolerance  Patient tolerated treatment well    Behavior During Therapy  [facility] for tasks assessed/performed       Past Medical History:  Diagnosis Date  . Diabetes mellitus without complication (HCC)    diet controlled  . Fibromyalgia   . GERD (gastroesophageal reflux disease)   . Hypercholesterolemia   . Hypertension   . Polyarthritis rheumatica (HCC)    RA  . Shingles   . Vision impairment    right eye    Past Surgical History:  Procedure Laterality Date  . ABDOMINAL HYSTERECTOMY    . [place] 1  1980's  . PARTIAL KNEE ARTHROPLASTY Left 01/17/2017   Procedure: UNICOMPARTMENTAL LEFT KNEE;  Surgeon: Paralee Cancel, MD;  [facility];  Service: Orthopedics;  Laterality: Left;  . RADIOLOGY WITH ANESTHESIA N/A 08/11/2017   Procedure: MRI OF BRAIN WITH AND WITHOUT CONSTRAST, AND OF THE ORBIT WITH AND WITHOUT CONTRAST;  Surgeon: Radiologist, Medication, MD;  Location: [place];  Service: Radiology;  Laterality: N/A;  . RADIOLOGY WITH ANESTHESIA N/A 09/06/2017   Procedure: MRI OF BRAIN;  Surgeon: Radiologist, Medication, MD;  Location: [place];  Service: Radiology;  Laterality: N/A;  . RHINOPLASTY    . TOTAL KNEE ARTHROPLASTY Right 09/30/2015   Procedure: TOTAL RIGHT KNEE ARTHROPLASTY;  Surgeon: Paralee Cancel, MD;  [facility];  Service: Orthopedics;  Laterality: Right;    There  were no vitals filed for this visit.  Subjective Assessment - 09/26/17 1408    Subjective   My vision is improving and is a lot better than it was 2 weeks ago    Pertinent History   CVA, s/p TKA, HTN, DM, Fibromyalgia, Hyperlipidemia, optic neuropathy of R eye prior to CVA, Polymyalgia rheumatica, retinitis pigmentosa    Currently in Pain?  No/denies        [facility] OT Assessment - 09/26/17 0001      Assessment   Medical Diagnosis  Stroke    Referring Provider  Dr. Eulogio Bear; Dr. Donavan Burnet (PCP)    Onset Date/Surgical Date  09/04/17    Hand Dominance  Right    Prior Therapy  hospitalied 09/04/17-09/07/17      Precautions   Precautions  -- not driving      Balance Screen   Has the patient fallen in the past 6 months  No      Home  Environment   Family/patient expects to be discharged to:  Private residence    Lives With  [place]  Retired    Leisure  enjoys reading, goes to [employer] 5 days/week      ADL   ADL comments  Performing BADLs mod  I      IADL   Shopping  Assistance for transportation    Light Housekeeping  -- mod I    Meal Prep  -- Mod I    Prior Level of Function Community Mobility  independent     Community Mobility  Relies on family or friends for transportation    Medication Management  Is responsible for taking medication in correct dosages at correct time    Psychiatrist financial matters independently (budgets, writes checks, pays rent, bills goes to bank), collects and keeps track of income      Mobility   Mobility Status  Independent      Written Expression   Dominant Hand  Right    Handwriting  -- mild incr time      Vision - History   Baseline Vision  Wears glasses only for reading glasses for distance (tv, driving only)    Visual History  Other (comment)    Patient Visual Report  -- optic neuropathy R eye; retinitis pigmentosa, cataract sx     Additional Comments  pt reports prior deficits with visual fields, particularly R side and decr night vision prior (no night driving for years), pt reports that ophthalmologist did visual field testing and it was close to prior level      Vision Assessment   Ocular Range of Motion  Within Functional Limits    Tracking/Visual Pursuits  Able to track stimulus in all quads without difficulty    Convergence  Within functional limits    Visual Fields  -- decr bilaterally (hx of deficits prior)    Acuity  -- mild ?blurriness, "difficult to describe" but improving    Diplopia Assessment  -- denies    Comment  Pt reports mild blurriness, hx optic neuropathy of R eye, prior/baseline peripheral vision deficit R eye, hx of retinitis pigmentosa      Cognition   Overall Cognitive Status  Within Functional Limits for tasks assessed mild delayed recall      Sensation   Additional Comments  numbness R side of neck/shoulder      Coordination   9 Hole Peg Test  Right;Left    Right 9 Hole Peg Test  19.88      ROM / Strength   AROM / PROM / Strength  AROM;Strength      AROM   Overall AROM   Within functional limits for tasks performed      Strength   Overall Strength  Within functional limits for tasks performed      Hand Function   Right Hand Grip (lbs)  40    Left Hand Grip (lbs)  40                      OT Education - 09/26/17 1709    Education provided  Yes    Education Details  OT eval results; recommendation to follow up with eye doctor for vision    Person(s) Educated  Patient    Methods  Explanation    Comprehension  Verbalized understanding                 Plan - 09/26/17 1722    Clinical Impression Statement  Pt is a 67 y.o. female s/p CVA 09/04/17.  Pt with PMH that includes:  s/p TKA, HTN, DM, Fibromyalgia, Hyperlipidemia, optic neuropathy of R eye prior to CVA, Polymyalgia rheumatica, retinitis pigmentosa.  Pt presents today with visual impairment and  mild balance changes.  Pt with hx of visual impairments due to optic neuropathy of R eye and retinitis pigmentosa, but reports some additional visual changes (? blurriness) after CVA; however, pt is being followed by ophthalmologist for this and will obtain clearance prior to driving.  Pt reports that she is able to navigate stores and within the home without difficulty now.  Therefore, no further occupational therapy needed at this time.  Pt will continue with PT for balance changes.      Occupational Profile and client history currently impacting functional performance  Pt reports that she is unable to drive due to visual changes, but is able to navigate stores and home environments.  Pt has not yet returned to full community fitness classes/activities.    Occupational performance deficits (Please refer to evaluation for details):  Leisure;Social Participation;IADL's    OT Frequency  1x / week eval only    OT Treatment/Interventions  Self-care/ADL training;Patient/family education    Plan  No further occupational therapy needed at this time.  Pt will follow up with ophthalmologist for visual changes and regarding clearance for return to driving.      Clinical Decision Making  Limited treatment options, no task modification necessary    Recommended Other Services  Physical therapy will see pt for balance changes    Consulted and Agree with Plan of Care  Patient       Patient will benefit from skilled therapeutic intervention in order to improve the following deficits and impairments:     Visit Diagnosis: Visuospatial deficit  Unsteadiness on feet    Problem List Patient Active Problem List   Diagnosis Date Noted  . Hyperlipidemia   . Ischemic optic neuropathy of right eye   . Stroke ([place]) 09/04/2017  . Hypertension complicating diabetes ([place]) 09/04/2017  . Diabetes mellitus ([place]) 09/04/2017  . Polymyalgia rheumatica ([place]) 09/04/2017  . Fibromyalgia 09/04/2017  . S/P left UKR  01/17/2017  . Overweight (BMI 25.0-29.9) 10/01/2015  . S/P right TKA 09/30/2015    [facility] 09/26/2017, 5:35 PM  [facility] [address] [place], [place], [address] Phone: [phone]   Fax:  [phone]  Name: AMENAH TUCCI MRN: [id number] Date of Birth: 1950-10-23   Vianne Bulls, OTR/L [facility] [address] [place], [place]  [address] [phone] phone [phone] 09/26/17 5:35 PM

## 2017-10-07 ENCOUNTER — Encounter: Payer: Self-pay | Admitting: Rehabilitation

## 2017-10-07 ENCOUNTER — Ambulatory Visit: Payer: PPO | Attending: Internal Medicine | Admitting: Rehabilitation

## 2017-10-07 DIAGNOSIS — R2681 Unsteadiness on feet: Secondary | ICD-10-CM | POA: Diagnosis not present

## 2017-10-07 DIAGNOSIS — R41842 Visuospatial deficit: Secondary | ICD-10-CM | POA: Insufficient documentation

## 2017-10-07 DIAGNOSIS — R2689 Other abnormalities of gait and mobility: Secondary | ICD-10-CM

## 2017-10-07 NOTE — Therapy (Signed)
[facility] [address] [place], [place], [address] Phone: [phone]   Fax:  [phone]  Physical Therapy Treatment  Patient Details  Name: Misty Morris MRN: [id number] Date of Birth: 1951-06-29 Referring Provider: Donnie Coffin, MD   Encounter Date: 10/07/2017  PT End of Session - 10/07/17 1540    Visit Number  2    Number of Visits  5    Date for PT Re-Evaluation  10/26/17    Authorization Type  [employer] [facility]    PT Start Time  7371    PT Stop Time  1445    PT Time Calculation (min)  43 min    Activity Tolerance  Patient tolerated treatment well    Behavior During Therapy  [facility] for tasks assessed/performed       Past Medical History:  Diagnosis Date  . Diabetes mellitus without complication (HCC)    diet controlled  . Fibromyalgia   . GERD (gastroesophageal reflux disease)   . Hypercholesterolemia   . Hypertension   . Polyarthritis rheumatica (HCC)    RA  . Shingles   . Vision impairment    right eye    Past Surgical History:  Procedure Laterality Date  . ABDOMINAL HYSTERECTOMY    . [place] 1  1980's  . PARTIAL KNEE ARTHROPLASTY Left 01/17/2017   Procedure: UNICOMPARTMENTAL LEFT KNEE;  Surgeon: Paralee Cancel, MD;  Location: [facility];  Service: Orthopedics;  Laterality: Left;  . RADIOLOGY WITH ANESTHESIA N/A 08/11/2017   Procedure: MRI OF BRAIN WITH AND WITHOUT CONSTRAST, AND OF THE ORBIT WITH AND WITHOUT CONTRAST;  Surgeon: Radiologist, Medication, MD;  Location: [facility];  Service: Radiology;  Laterality: N/A;  . RADIOLOGY WITH ANESTHESIA N/A 09/06/2017   Procedure: MRI OF BRAIN;  Surgeon: Radiologist, Medication, MD;  Location: [facility];  Service: Radiology;  Laterality: N/A;  . RHINOPLASTY    . TOTAL KNEE ARTHROPLASTY Right 09/30/2015   Procedure: TOTAL RIGHT KNEE ARTHROPLASTY;  Surgeon: Paralee Cancel, MD;  Location[facility];  Service: Orthopedics;  Laterality: Right;    There were no vitals  filed for this visit.  Subjective Assessment - 10/07/17 1401    Subjective  Pt reports feeling down this past week but reports she is feeling much better and is ready to continue with therapy and working out.     Limitations  Walking    Patient Stated Goals  "to be more balance"    Currently in Pain?  No/denies                      [facility] Adult PT Treatment/Exercise - 10/07/17 1428      Ambulation/Gait   Stairs  Yes    Stairs Assistance  7: Independent    Stair Management Technique  No rails;Alternating pattern;Forwards    Number of Stairs  4 x 2 reps    Height of Stairs  6      Neuro Re-ed    Neuro Re-ed Details   Reviewed current corner balance HEP. While in corner had pt stand on pillow in tandem stance and maintained balance x 2 sets of 20 secs with light single UE support as needed.  See pt instruction.  While standing on blue therapy mat had pt tap cones alternating LEs x 7 reps (2 sets) progressing to tipping cone over and back alternating LEs x 7 reps (2 sets) for increased time in SLS.  Performed counter top marching fowards/backwards x  2 laps, tandem forwards/backwards x 2 lap, standing on blue mat on ramp with staggered stance maintaining balance with EC x 30 secs each direction.  Marching x 10 reps on each side.              PT Education - 10/07/17 1540    Education provided  Yes    Education Details  additions to [employer]) Educated  Patient    Methods  Explanation;Demonstration;Handout    Comprehension  Verbalized understanding;Returned demonstration       PT Short Term Goals - 09/26/17 1528      PT SHORT TERM GOAL #1   Title  =LTGs        PT Long Term Goals - 09/26/17 1528      PT LONG TERM GOAL #1   Title  Pt will be independent with HEP in order to indicate decreased fall risk.  (Target Date: 10/26/17)    Time  4    Period  Weeks    Status  New    Target Date  10/26/17      PT LONG TERM GOAL #2   Title  Pt will improve FGA to  >/=25/30 in order to indicate decreased fall risk.     Time  4    Period  Weeks    Status  New      PT LONG TERM GOAL #3   Title  Pt will report return to balance exercise class at [facility] in order to indicate safe return to community fitness.     Time  4    Period  Weeks    Status  New      PT LONG TERM GOAL #4   Title  Pt will ambulate over varying outdoor surfaces 1000' at independent level in order to indicate safe return to community and leisure mobility.      Time  4    Period  Weeks    Status  New            Plan - 10/07/17 1540    Clinical Impression Statement  Skilled session focused on re-assessing current HEP and appropriateness.  Corner balance tasks still demo'd moderately difficult, therefore maintained these but did add additional balance exercises.  Continued high level balance on compliant surface with emphasis on SLS>      Rehab Potential  Excellent    PT Frequency  1x / week    PT Duration  4 weeks    PT Treatment/Interventions  ADLs/Self Care Home Management;Gait training;Stair training;Functional mobility training;Therapeutic activities;Therapeutic exercise;Balance training;Neuromuscular re-education;Patient/family education;Vestibular    PT Next Visit Plan  Check compliance with HEP, add as able, continue high level balance, outdoors gait as able    PT Home Exercise Plan  see HEP initiated 09/26/17    Consulted and Agree with Plan of Care  Patient       Patient will benefit from skilled therapeutic intervention in order to improve the following deficits and impairments:  Decreased balance, Decreased mobility  Visit Diagnosis: Unsteadiness on feet  Other abnormalities of gait and mobility     Problem List Patient Active Problem List   Diagnosis Date Noted  . Hyperlipidemia   . Ischemic optic neuropathy of right eye   . Stroke ([place]) 09/04/2017  . Hypertension complicating diabetes ([place]) 09/04/2017  . Diabetes mellitus ([place])  09/04/2017  . Polymyalgia rheumatica ([place]) 09/04/2017  . Fibromyalgia 09/04/2017  . S/P left UKR 01/17/2017  .  Overweight (BMI 25.0-29.9) 10/01/2015  . S/P right TKA 09/30/2015   Cameron Sprang, PT, MPT [facility] [address] [place], [place], [address] Phone: [phone]   Fax:  [phone] 10/07/17, 3:42 PM  Name: Misty Morris MRN: [id number] Date of Birth: November 21, 1950

## 2017-10-07 NOTE — Patient Instructions (Signed)
Stand in corner with chair in front of you for support.    Feet Together (Compliant Surface) Head Motion - Eyes Open    With eyes open, standing on compliant surface: __pillow______, feet together, move head slowly: up and down x 10 reps, side to side x 10 reps, and diagonally both directions x 10 reps.  Repeat ___1_ times per session. Do 1-2____ sessions per day.  Copyright  VHI. All rights reserved.    Feet Together (Compliant Surface) Arm Motion - Eyes Closed    Stand on compliant surface: ___pillow_____ with feet together. Close eyes and keep arms by your side.  Repeat __3__ times per session for 30 secs each. Do __1-2__ sessions per day.  Copyright  VHI. All rights reserved.   Tandem Stance    Stand on pillow for this exercise.  Right foot in front of left, heel touching toe both feet "straight ahead". Stand on Foot Triangle of Support with both feet. Balance in this position _15__ seconds. Do with left foot in front of right.  Do each one x 3 times.   Copyright  VHI. All rights reserved.   "I love a Database administrator    Stand beside counter for support.  March forwards and backwards very slowly to work on standing on one leg.   Do 4 laps forwards/backwards along counter.    http://gt2.exer.us/345   Copyright  VHI. All rights reserved.

## 2017-10-10 DIAGNOSIS — Z79899 Other long term (current) drug therapy: Secondary | ICD-10-CM | POA: Diagnosis not present

## 2017-10-10 DIAGNOSIS — M353 Polymyalgia rheumatica: Secondary | ICD-10-CM | POA: Diagnosis not present

## 2017-10-10 DIAGNOSIS — M79644 Pain in right finger(s): Secondary | ICD-10-CM | POA: Diagnosis not present

## 2017-10-10 DIAGNOSIS — M797 Fibromyalgia: Secondary | ICD-10-CM | POA: Diagnosis not present

## 2017-10-10 DIAGNOSIS — M8580 Other specified disorders of bone density and structure, unspecified site: Secondary | ICD-10-CM | POA: Diagnosis not present

## 2017-10-10 DIAGNOSIS — M1990 Unspecified osteoarthritis, unspecified site: Secondary | ICD-10-CM | POA: Diagnosis not present

## 2017-10-13 ENCOUNTER — Ambulatory Visit: Payer: PPO | Admitting: Rehabilitation

## 2017-10-13 ENCOUNTER — Encounter: Payer: Self-pay | Admitting: Rehabilitation

## 2017-10-13 DIAGNOSIS — R2681 Unsteadiness on feet: Secondary | ICD-10-CM | POA: Diagnosis not present

## 2017-10-13 DIAGNOSIS — R2689 Other abnormalities of gait and mobility: Secondary | ICD-10-CM

## 2017-10-13 NOTE — Therapy (Signed)
[facility] [address] [place], [place], [address] Phone: [phone]   Fax:  [phone]  Physical Therapy Treatment  Patient Details  Name: Misty Morris MRN: [id number] Date of Birth: 1951-04-03 Referring Provider: Donnie Coffin, MD   Encounter Date: 10/13/2017  PT End of Session - 10/13/17 1103    Visit Number  3    Number of Visits  5    Date for PT Re-Evaluation  10/26/17    Authorization Type  [employer] [facility]    PT Start Time  1100    PT Stop Time  1143    PT Time Calculation (min)  43 min    Activity Tolerance  Patient tolerated treatment well    Behavior During Therapy  [facility] for tasks assessed/performed       Past Medical History:  Diagnosis Date  . Diabetes mellitus without complication (HCC)    diet controlled  . Fibromyalgia   . GERD (gastroesophageal reflux disease)   . Hypercholesterolemia   . Hypertension   . Polyarthritis rheumatica (HCC)    RA  . Shingles   . Vision impairment    right eye    Past Surgical History:  Procedure Laterality Date  . ABDOMINAL HYSTERECTOMY    . [place] 1  1980's  . PARTIAL KNEE ARTHROPLASTY Left 01/17/2017   Procedure: UNICOMPARTMENTAL LEFT KNEE;  Surgeon: Paralee Cancel, MD;  [facility];  Service: Orthopedics;  Laterality: Left;  . RADIOLOGY WITH ANESTHESIA N/A 08/11/2017   Procedure: MRI OF BRAIN WITH AND WITHOUT CONSTRAST, AND OF THE ORBIT WITH AND WITHOUT CONTRAST;  Surgeon: Radiologist, Medication, MD;  Location: [place];  Service: Radiology;  Laterality: N/A;  . RADIOLOGY WITH ANESTHESIA N/A 09/06/2017   Procedure: MRI OF BRAIN;  Surgeon: Radiologist, Medication, MD;  Location: [facility];  Service: Radiology;  Laterality: N/A;  . RHINOPLASTY    . TOTAL KNEE ARTHROPLASTY Right 09/30/2015   Procedure: TOTAL RIGHT KNEE ARTHROPLASTY;  Surgeon: Paralee Cancel, MD;  [facility];  Service: Orthopedics;  Laterality: Right;    There were no vitals  filed for this visit.  Subjective Assessment - 10/13/17 1102    Subjective  Pt reports increased shoulder pain today and feels it is due to polymyalgia rhumatica.     Limitations  Walking    Patient Stated Goals  "to be more balance"    Currently in Pain?  No/denies                      [facility] Adult PT Treatment/Exercise - 10/13/17 1124      Ambulation/Gait   Ambulation/Gait  Yes    Ambulation/Gait Assistance  6: Modified independent (Device/Increase time)    Ambulation Distance (Feet)  500 Feet    Assistive device  None    Gait Pattern  Within Functional Limits    Ambulation Surface  Unlevel;Level;Indoor;Outdoor;Paved;Gravel;Grass    Gait Comments  High level gait with ball toss x 115' at distant S level.  No overt LOB.  Also had pt performing gait while handing ball back to PT causing pt to have to turn and look for ball. Again, no overt LOB, some difficulty due to vision issues.         Neuro Re-ed    Neuro Re-ed Details   In corner standing on foam airex pad with feet narrowed EO x 30 secs, EC x 30 secs>feet together EO with head turns up/down side/side x 10  reps, tandem stance x 20 secs (2 sets) each way. Pt needing light UE support during tandem stance, all others without UE support.  Standing with single LE in stance on foam pad with opposite LE lightly placed on yoga block tossing ball to wall x 15 reps with each foot in stance. On small rocker board maintaining balance with feet slightly narrowed x 30 secs>EC x 30 secs (2 sets)>moving board forward/backwards with vertical head motion x 10 reps with intermittent UE support.  Standing on BOSU (black top) maintaining balance x 3 reps of 10-15 secs without support).   Feet apart on BOSU performing mini squats x 5 reps with light UE support and x 5 reps without UE support.               PT Education - 10/13/17 1103    Education provided  Yes    Education Details  purpose of balance exercises    Person(s) Educated   Patient    Methods  Explanation    Comprehension  Verbalized understanding       PT Short Term Goals - 09/26/17 1528      PT SHORT TERM GOAL #1   Title  =LTGs        PT Long Term Goals - 09/26/17 1528      PT LONG TERM GOAL #1   Title  Pt will be independent with HEP in order to indicate decreased fall risk.  (Target Date: 10/26/17)    Time  4    Period  Weeks    Status  New    Target Date  10/26/17      PT LONG TERM GOAL #2   Title  Pt will improve FGA to >/=25/30 in order to indicate decreased fall risk.     Time  4    Period  Weeks    Status  New      PT LONG TERM GOAL #3   Title  Pt will report return to balance exercise class at [facility] in order to indicate safe return to community fitness.     Time  4    Period  Weeks    Status  New      PT LONG TERM GOAL #4   Title  Pt will ambulate over varying outdoor surfaces 1000' at independent level in order to indicate safe return to community and leisure mobility.      Time  4    Period  Weeks    Status  New            Plan - 10/13/17 1103    Clinical Impression Statement  Skilled session continues to focus on high level balance and gait with emphasis on compliant surfaces, SLS, and head motion to challenge vestibular system.  Pt making excellent progress despite saying she is having an "off day" due to shoulder pain.  Pt has still not returned to balance class at [facility]  Encouraged her to do so this week so that she may ask any questions in next PT session.      Rehab Potential  Excellent    PT Frequency  1x / week    PT Duration  4 weeks    PT Treatment/Interventions  ADLs/Self Care Home Management;Gait training;Stair training;Functional mobility training;Therapeutic activities;Therapeutic exercise;Balance training;Neuromuscular re-education;Patient/family education;Vestibular    PT Next Visit Plan  Continue with high level balance and gait, SLS, compliant surfaces    PT Home Exercise  Plan  see HEP initiated 09/26/17    Consulted and Agree with Plan of Care  Patient       Patient will benefit from skilled therapeutic intervention in order to improve the following deficits and impairments:  Decreased balance, Decreased mobility  Visit Diagnosis: Unsteadiness on feet  Other abnormalities of gait and mobility     Problem List Patient Active Problem List   Diagnosis Date Noted  . Hyperlipidemia   . Ischemic optic neuropathy of right eye   . Stroke ([place]) 09/04/2017  . Hypertension complicating diabetes ([place]) 09/04/2017  . Diabetes mellitus ([place]) 09/04/2017  . Polymyalgia rheumatica ([place]) 09/04/2017  . Fibromyalgia 09/04/2017  . S/P left UKR 01/17/2017  . Overweight (BMI 25.0-29.9) 10/01/2015  . S/P right TKA 09/30/2015    Cameron Sprang, PT, MPT [facility] [address] [place], [place], [address] Phone: [phone]   Fax:  [phone] 10/13/17, 12:03 PM  Name: Misty Morris MRN: [id number] Date of Birth: 12-18-1950

## 2017-10-14 DIAGNOSIS — H4710 Unspecified papilledema: Secondary | ICD-10-CM | POA: Diagnosis not present

## 2017-10-14 DIAGNOSIS — H401132 Primary open-angle glaucoma, bilateral, moderate stage: Secondary | ICD-10-CM | POA: Diagnosis not present

## 2017-10-19 ENCOUNTER — Encounter: Payer: Self-pay | Admitting: Adult Health

## 2017-10-19 ENCOUNTER — Ambulatory Visit (INDEPENDENT_AMBULATORY_CARE_PROVIDER_SITE_OTHER): Payer: PPO | Admitting: Adult Health

## 2017-10-19 ENCOUNTER — Ambulatory Visit: Payer: PPO | Admitting: Physical Therapy

## 2017-10-19 ENCOUNTER — Encounter: Payer: Self-pay | Admitting: Physical Therapy

## 2017-10-19 VITALS — BP 161/91 | HR 93 | Ht 62.0 in | Wt 149.2 lb

## 2017-10-19 DIAGNOSIS — I63532 Cerebral infarction due to unspecified occlusion or stenosis of left posterior cerebral artery: Secondary | ICD-10-CM | POA: Diagnosis not present

## 2017-10-19 DIAGNOSIS — R2689 Other abnormalities of gait and mobility: Secondary | ICD-10-CM

## 2017-10-19 DIAGNOSIS — R2681 Unsteadiness on feet: Secondary | ICD-10-CM

## 2017-10-19 DIAGNOSIS — R41842 Visuospatial deficit: Secondary | ICD-10-CM

## 2017-10-19 NOTE — Patient Instructions (Signed)
Continue aspirin 325 mg daily for secondary stroke prevention Continue to follow up with PCP regarding cholesterol, blood pressure and diabetes - A1c and cholesterol levels checked with next appointment - consider possibly starting cholesterol medication at that time if needed Continue eating healthy and exercising continue PT at the neuro rehab center  Maintain strict control of hypertension with blood pressure goal below 130/90, diabetes with hemoglobin A1c goal below 6.5% and cholesterol with LDL cholesterol (bad cholesterol) goal below 70 mg/dL. I also advised the patient to eat a healthy diet with plenty of whole grains, cereals, fruits and vegetables, exercise regularly and maintain ideal body weight.  Followup in the future with me in 6 months or call earlier if needed

## 2017-10-19 NOTE — Progress Notes (Signed)
[name] [address] [place]. [place] [address] [phone]       OFFICE FOLLOW UP NOTE  [name] Date of Birth:  [birth year] Medical Record Number:  [id number]   Reason for Referral:  Hospital stroke follow up  CHIEF COMPLAINT:  Chief Complaint  Patient presents with  . Follow-up    Stroke follow up was seen by Dr. Erlinda Hong in hospital    HPI: Misty Morris is being seen today for initial visit in the office for left PCA infarct on 09/04/17. History obtained from patient and chart review. Reviewed all radiology images and labs personally.  Misty Morris is an 67 y.o. female with a past medical history of type 2 diabetes mellitus hypertension, dyslipidemia, fibromyalgia rheumatica who presented to the ER on 09/04/17 with right arm numbness.  Patient was in her usual state of health yesterday and went to bed around 1030pm and woke up around 4 AM this morning went to the bathroom, did not notice any abnormalities when she went to the bathroom.  Upon waking up the morning of admission, she noticed right arm and right leg numbness that seemed to get worse throughout the morning with some weakness in the right arm as well as unsteady gait.  She denies ever having such symptoms, personal history of stroke or family history of stroke.  CT head reviewed was negative for acute hemorrhage.  MRI reviewed and showed acute infarct in the left PCA territory and superimposed small acute subacute appearing infarct in the superior left occipital lobe.  MRA did show multifocal severe stenosis in the left PCA but negative for large vessel occlusion.  2D echo showed an EF of 65-70%.  LDL 143 and A1c 8.7.  It was recommended the patient be discharged on Lipitor 80 mg as well as he is aspirin and Plavix for 3 months and then continue Plavix alone.  Patient discharged home in stable condition.                Since discharge, patient has been doing well.  She only has slight right  shoulder numbness but denies other numbness or weakness.  Patient states she was unable to tolerate her Plavix as she felt as though her chest was getting "squeezed"shortly after taking the medication.  Patient did continue to take aspirin 325 mg daily without increase in bleeding or bruising.  Did not take Lipitor as she believes they are "bad drug".  She believes is about all statin medications.  She does state that she has started to eat healthy and participate at the gym daily.  She is also been attending PT at the neuro rehabilitation center where she has 1 more visit and they have been mainly helping her for her balance.  She does have a history of retinitis pigmentosa or according to her ophthalmologist this is almost back to her baseline.  Blood pressure at today's appointment was mildly elevated at 161/91.  Patient states that she does check her blood pressure at home her systolics are usually 951O.  Her fasting sugars have been ranging from 90-107.  Denies new or worsening stroke/TIA symptoms at this time.   ROS:   14 system review of systems performed and negative with exception of feeling cold, aching muscles, and numbness  PMH:  Past Medical History:  Diagnosis Date  . Diabetes mellitus without complication (HCC)    diet controlled  . Fibromyalgia   . GERD (gastroesophageal reflux disease)   .  Hypercholesterolemia   . Hypertension   . Polyarthritis rheumatica (HCC)    RA  . Shingles   . Stroke ([place])   . Vision impairment    right eye    PSH:  Past Surgical History:  Procedure Laterality Date  . ABDOMINAL HYSTERECTOMY    . [place] 1  1980's  . PARTIAL KNEE ARTHROPLASTY Left 01/17/2017   Procedure: UNICOMPARTMENTAL LEFT KNEE;  Surgeon: Paralee Cancel, MD;  Location: [facility];  Service: Orthopedics;  Laterality: Left;  . RADIOLOGY WITH ANESTHESIA N/A 08/11/2017   Procedure: MRI OF BRAIN WITH AND WITHOUT CONSTRAST, AND OF THE ORBIT WITH AND WITHOUT CONTRAST;  Surgeon: Radiologist,  Medication, MD;  [facility];  Service: Radiology;  Laterality: N/A;  . RADIOLOGY WITH ANESTHESIA N/A 09/06/2017   Procedure: MRI OF BRAIN;  Surgeon: Radiologist, Medication, MD;  Location: [facility];  Service: Radiology;  Laterality: N/A;  . RHINOPLASTY    . TOTAL KNEE ARTHROPLASTY Right 09/30/2015   Procedure: TOTAL RIGHT KNEE ARTHROPLASTY;  Surgeon: Paralee Cancel, MD;  [facility];  Service: Orthopedics;  Laterality: Right;    Social History:  Social History   Socioeconomic History  . Marital status: Divorced    Spouse name: Not on file  . Number of children: Not on file  . Years of education: Not on file  . Highest education level: Not on file  Social Needs  . Financial resource strain: Not on file  . Food insecurity - worry: Not on file  . Food insecurity - inability: Not on file  . Transportation needs - medical: Not on file  . Transportation needs - non-medical: Not on file  Occupational History  . Not on file  Tobacco Use  . Smoking status: Former Smoker    Types: Cigarettes    Last attempt to quit: 12/27/2011    Years since quitting: 5.8  . Smokeless tobacco: Never Used  Substance and Sexual Activity  . Alcohol use: No  . Drug use: Yes    Types: Marijuana  . Sexual activity: Not on file  Other Topics Concern  . Not on file  Social History Narrative  . Not on file    Family History:  Family History  Problem Relation Age of Onset  . Hypertension Mother   . Seizures Mother   . Hypertension Father   . Hypertension Sister   . Pancreatic cancer Other     Medications:   Current Outpatient Medications on File Prior to Visit  Medication Sig Dispense Refill  . Acetylcarn-Alpha Lipoic Acid 400-200 MG CAPS Take 1 capsule by mouth 2 (two) times daily.    Marland Kitchen amLODipine (NORVASC) 5 MG tablet Take 5 mg by mouth every evening.   2  . aspirin 325 MG tablet Take 1 tablet (325 mg total) by mouth daily. 30 tablet 0  . Biotin 1000 MCG tablet Take 1,000 mcg by mouth every  evening.    . Calcium Carb-Cholecalciferol (CALCIUM 1000 + D PO) Take 1 tablet by mouth every evening.    . Cholecalciferol (VITAMIN D) 2000 units CAPS Take 4,000 Units by mouth daily.    . Chromium Picolinate 200 MCG CAPS Take 200 mcg by mouth daily.    . Coenzyme Q10 (COQ-10) 100 MG CAPS Take 100 mg by mouth daily.    . diphenhydrAMINE (BENADRYL) 25 MG tablet Take 25 mg by mouth at bedtime.    Marland Kitchen Lysine 500 MG CAPS Take 500 mg by mouth every evening.    Marland Kitchen  Omega-3 Fatty Acids (FISH OIL) 1200 MG CAPS Take 1,200 mg 2 (two) times daily by mouth.    . prednisoLONE 5 MG TABS tablet Take 5 mg by mouth every evening. Take with 4 mg prednisone to add up to 9mg s daily    . predniSONE (DELTASONE) 1 MG tablet Take 4 mg by mouth every evening. Take with 5mg s, add up to 9mg s daily    . TIMOPTIC OCUDOSE 0.5 % SOLN Place 1 drop into both eyes daily. Preservative free only  0  . ZIOPTAN 0.0015 % SOLN Place 1 drop into both eyes at bedtime.  3   No current facility-administered medications on file prior to visit.     Allergies:   Allergies  Allergen Reactions  . Codeine Hives  . Demerol [Meperidine] Hives  . Latex Dermatitis and Other (See Comments)    Blistering   . Morphine And Related Hives  . Sulfa Antibiotics Hives  . Tramadol Hives  . Vicodin [Hydrocodone-Acetaminophen] Itching    Elevated blood pressure. Tolerates low doses.     Physical Exam  Vitals:   10/19/17 1347  BP: (!) 161/91  Pulse: 93  Weight: 149 lb 3.2 oz (67.7 kg)  Height: 5\' 2"  (1.575 m)   Body mass index is 27.29 kg/m. No exam data present  General: well developed, elderly Caucasian female, well nourished, seated, in no evident distress Head: head normocephalic and atraumatic.   Neck: supple with no carotid or supraclavicular bruits Cardiovascular: regular rate and rhythm, no murmurs Musculoskeletal: no deformity Skin:  no rash/petichiae Vascular:  Normal pulses all extremities  Neurologic Exam Mental Status:  Awake and fully alert. Oriented to place and time. Recent and remote memory intact. Attention span, concentration and fund of knowledge appropriate. Mood and affect appropriate.  Cranial Nerves: Fundoscopic exam reveals sharp disc margins. Pupils equal, briskly reactive to light. Extraocular movements full without nystagmus. Right retinitis pigmentosa. Hearing intact. Facial sensation intact. Face, tongue, palate moves normally and symmetrically.  Motor: Normal bulk and tone. Normal strength in all tested extremity muscles. Sensory.: intact to touch , pinprick , position and vibratory sensation.  Coordination: Rapid alternating movements normal in all extremities. Finger-to-nose and heel-to-shin performed accurately bilaterally. Gait and Station: Arises from chair without difficulty. Stance is normal. Gait demonstrates normal stride length and balance . Able to heel, toe and tandem walk without difficulty.  Reflexes: 1+ and symmetric. Toes downgoing.    NIHSS  1 Modified Rankin  1    Diagnostic Data (Labs, Imaging, Testing)  CT head 09/04/17 No acute finding. Chronic small-vessel ischemic changes of the hemispheric white matter  MR MRA head and neck 09/06/17 1. Positive for scattered acute infarcts in the Left PCA territory with petechial hemorrhage. 2. Superimposed small subacute appearing infarct with hemosiderin in the superior left occipital lobe (series 8, image 17) with questionable associated trace left posterior convexity Subdural Hematoma. Recommend repeat noncontrast Head CT with attention to this area. 3. MRA positive for multifocal severe stenosis in the left PCA, but no large vessel occlusion. 4. Neck MRA reveals tortuous cervical carotid and vertebral arteries with atherosclerosis but no stenosis. 5. Head MRA reveals intracranial atherosclerosis with up to moderate stenosis of the dominant distal right vertebral artery, and severe stenosis the right PCA P3 division. 6.  Aside from #1 and #2 stable MRI appearance of the brain since 08/11/2017.  TTE 09/07/17 - Left ventricle: The cavity size was normal. There was mild   concentric hypertrophy. Systolic function was vigorous. The  estimated ejection fraction was in the range of 65% to 70%. Wall   motion was normal; there were no regional wall motion   abnormalities. Left ventricular diastolic function parameters   were normal. Doppler parameters are consistent with high   ventricular filling pressure. - Aortic valve: Trileaflet; mildly thickened, mildly calcified   leaflets. - Tricuspid valve: There was mild regurgitation.    ASSESSMENT: Misty Morris is a 67 y.o. year old female here with left PCA infarct on 09/04/17 secondary to small vessel disease. Vascular risk factors are HTN, HLD, and DM.     PLAN: -Continue aspirin 325 mg daily  - patient refusing to continue Plavix - willing to continue with aspirin 325mg . Did not complete 3 month therapy of DAPT. Explained risks of this and patient understands -refuses to use statin therapy for cholesterol management at this time - will have PCP change A1c and lipid levels during appointment in May and possible consider taking statin at that time. Explained risks of not having adequate control over cholesterol level and patient understands -continue outpatient PT -Maintain strict control of hypertension with blood pressure goal below 130/90, diabetes with hemoglobin A1c goal below 6.5% and cholesterol with LDL cholesterol (bad cholesterol) goal below 70 mg/dL. I also advised the patient to eat a healthy diet with plenty of whole grains, cereals, fruits and vegetables, exercise regularly and maintain ideal body weight. -Greater than 50% time during this 25 minute consultation visit was spent on counseling and coordination of care about HLD, DM and HTN (risk factors), discussion about risk benefit of anticoagulation and answering questions. Expalined to patient that  importance of compliance with medications and this will help reduce risk for further stroke. Patient states understanding but strongly refusing to take Plavix and Lipitor at this time.   Follow up 6 months or call earlier if needed   Venancio Poisson, [facility]  [facility] [address] [place], [place] [address]  Phone [phone] Fax [phone]

## 2017-10-19 NOTE — Therapy (Signed)
[facility] [address] [place], [place], [address] Phone: [phone]   Fax:  [phone]  Physical Therapy Treatment  Patient Details  Name: Misty Morris MRN: [id number] Date of Birth: Oct 12, 1950 Referring Provider: Donnie Coffin, MD   Encounter Date: 10/19/2017  PT End of Session - 10/19/17 1515    Visit Number  4    Number of Visits  5    Date for PT Re-Evaluation  10/26/17    Authorization Type  [employer] [facility]    PT Start Time  1146    PT Stop Time  1228    PT Time Calculation (min)  42 min    Equipment Utilized During Treatment  Gait belt    Activity Tolerance  Patient tolerated treatment well    Behavior During Therapy  [facility] for tasks assessed/performed       Past Medical History:  Diagnosis Date  . Diabetes mellitus without complication (HCC)    diet controlled  . Fibromyalgia   . GERD (gastroesophageal reflux disease)   . Hypercholesterolemia   . Hypertension   . Polyarthritis rheumatica (HCC)    RA  . Shingles   . Stroke ([place])   . Vision impairment    right eye    Past Surgical History:  Procedure Laterality Date  . ABDOMINAL HYSTERECTOMY    . [place] 1  1980's  . PARTIAL KNEE ARTHROPLASTY Left 01/17/2017   Procedure: UNICOMPARTMENTAL LEFT KNEE;  Surgeon: Paralee Cancel, MD;  [facility];  Service: Orthopedics;  Laterality: Left;  . RADIOLOGY WITH ANESTHESIA N/A 08/11/2017   Procedure: MRI OF BRAIN WITH AND WITHOUT CONSTRAST, AND OF THE ORBIT WITH AND WITHOUT CONTRAST;  Surgeon: Radiologist, Medication, MD;  Location: [facility];  Service: Radiology;  Laterality: N/A;  . RADIOLOGY WITH ANESTHESIA N/A 09/06/2017   Procedure: MRI OF BRAIN;  Surgeon: Radiologist, Medication, MD;  Location: [facility];  Service: Radiology;  Laterality: N/A;  . RHINOPLASTY    . TOTAL KNEE ARTHROPLASTY Right 09/30/2015   Procedure: TOTAL RIGHT KNEE ARTHROPLASTY;  Surgeon: Paralee Cancel, MD;  [facility];   Service: Orthopedics;  Laterality: Right;    There were no vitals filed for this visit.  Subjective Assessment - 10/19/17 1147    Subjective  Pt reported no falls and no changes in medication today. Pt did state she had less shoulder pain due to polymyalgia and fibromyalgia then the last time she was here, but it was not hindering her at all.     Limitations  Walking    Patient Stated Goals  "to be more balance"    Currently in Pain?  Yes    Pain Score  1     Pain Location  Shoulder    Pain Orientation  Right;Left    Pain Descriptors / Indicators  Constant    Pain Type  Chronic pain    Pain Onset  More than a month ago    Pain Frequency  Constant          [facility] Adult PT Treatment/Exercise - 10/19/17 1701      Transfers   Transfers  Sit to Stand;Stand to Sit    Sit to Stand  7: Independent    Stand to Sit  7: Independent    Comments  x 4 reps during rest breaks between tasks.      Ambulation/Gait   Ambulation/Gait  Yes    Ambulation/Gait Assistance  6: Modified independent (Device/Increase time)  Ambulation Distance (Feet)  70 Feet x 3 reps around clinic.    Assistive device  None    Gait Pattern  Within Functional Limits    Ambulation Surface  Indoor;Level      High Level Balance   High Level Balance Activities  Side stepping;Direction changes;Tandem walking;Marching forwards;Marching backwards    High Level Balance Comments  Placing red and blue mats beside countertop performed x 3 reps of each back and forth: Foward walking, reverse walking, forward tandem walking, reverse tandem walking, side stepping. Pt required initial verbal and demonstration cues only. Pt used single intermediate touch along counter only for tandem walking. For tandem walking, pt required cues for larger steps, controlled speed, and upright posture cues. Followed by, SLS stance x 10 reps bilaterally with holds between 3-10 sec holds, while standing on red mat. Followed by standing and turning head  and trunk to look over shoulder with a weight shift, while standing on red mat x 10 reps bilaterally.            Balance Exercises - 10/19/17 1711      Balance Exercises: Standing   Standing Eyes Closed  Foam/compliant surface;Wide (BOA);Head turns;Narrow base of support (BOS);30 secs;2 reps    Rockerboard  Anterior/posterior;Lateral;30 seconds;EO;EC;Head turns;Intermittent UE support      Balance Exercises: Standing   Standing Eyes Closed Limitations  With pt on airex pad: EC with open stance, EC with narrow BOS, EC with tandem stance, EC with tandem stance and head turns left/right, up/down for 30 sec holds x 2 reps each.     Rebounder Limitations  At parallel bars, using small rockerboard: anterior/posterior, lateral weight shifting, standing with EO and then EC, progressed with EC and head turns up/down, left/down. x 30 sec holds x 2 reps each. Pt used occasional UE two finger touch to regain upright balance.  Supervision to min guard assistance provided. Posterior leaning noted during EC tasks. No loss of balances.           PT Short Term Goals - 09/26/17 1528      PT SHORT TERM GOAL #1   Title  =LTGs        PT Long Term Goals - 10/19/17 1148      PT LONG TERM GOAL #1   Title  Pt will be independent with HEP in order to indicate decreased fall risk.  (Target Date: 10/26/17)    Baseline  10/19/17: Pt verbalized independent with HEP program to decrease fall risk.     Status  Achieved    Target Date  10/26/17      PT LONG TERM GOAL #2   Title  Pt will improve FGA to >/=25/30 in order to indicate decreased fall risk.     Status  On-going      PT LONG TERM GOAL #3   Title  Pt will report return to balance exercise class at [facility] in order to indicate safe return to community fitness.     Status  On-going      PT LONG TERM GOAL #4   Title  Pt will ambulate over varying outdoor surfaces 1000' at independent level in order to indicate safe return to community  and leisure mobility.      Status  On-going        Plan - 10/19/17 1516    Clinical Impression Statement  Today's skilled PT session focused on working on static balance and dynamic balance activities while on uneven surfaces,  and dynamic walking on uneven surfaces for LE strengthening and balance progression. Pt is demonstrating continued progress towards goals. Pt would benefit from continued PT sessions to address unmet PT goals regarding balance and safe community ambulation.     History and Personal Factors relevant to plan of care:  see above    Clinical Presentation  Stable    Clinical Presentation due to:  see above    Clinical Decision Making  Low    PT Frequency  1x / week    PT Duration  4 weeks    PT Treatment/Interventions  ADLs/Self Care Home Management;Gait training;Stair training;Functional mobility training;Therapeutic activities;Therapeutic exercise;Balance training;Neuromuscular re-education;Patient/family education;Vestibular    PT Next Visit Plan  Check LTG's next session due on 10/26/17.     PT Home Exercise Plan  see HEP initiated 09/26/17    Consulted and Agree with Plan of Care  Patient       Patient will benefit from skilled therapeutic intervention in order to improve the following deficits and impairments:     Visit Diagnosis: Unsteadiness on feet  Other abnormalities of gait and mobility  Visuospatial deficit     Problem List Patient Active Problem List   Diagnosis Date Noted  . Hyperlipidemia   . Ischemic optic neuropathy of right eye   . Stroke ([place]) 09/04/2017  . Hypertension complicating diabetes ([place]) 09/04/2017  . Diabetes mellitus ([place]) 09/04/2017  . Polymyalgia rheumatica ([place]) 09/04/2017  . Fibromyalgia 09/04/2017  . S/P left UKR 01/17/2017  . Overweight (BMI 25.0-29.9) 10/01/2015  . S/P right TKA 09/30/2015    Carlena Sax, SPTA 10/19/2017, 5:23 PM  [facility] [address]  [place], [place], [address] Phone: [phone]   Fax:  [phone]  Name: PANG ROBERS MRN: [id number] Date of Birth: February 24, 1951

## 2017-10-20 ENCOUNTER — Ambulatory Visit: Payer: PPO | Admitting: Rehabilitation

## 2017-10-20 NOTE — Progress Notes (Signed)
I have reviewed and agreed above plan. 

## 2017-10-27 ENCOUNTER — Ambulatory Visit: Payer: PPO | Admitting: Rehabilitation

## 2017-10-27 ENCOUNTER — Encounter: Payer: Self-pay | Admitting: Rehabilitation

## 2017-10-27 DIAGNOSIS — R2689 Other abnormalities of gait and mobility: Secondary | ICD-10-CM

## 2017-10-27 DIAGNOSIS — R2681 Unsteadiness on feet: Secondary | ICD-10-CM | POA: Diagnosis not present

## 2017-10-27 NOTE — Therapy (Signed)
[facility] [address] [place], [place], [address] Phone: [phone]   Fax:  [phone]  Physical Therapy Treatment and D/C Summary   Patient Details  Name: Misty Morris MRN: [id number] Date of Birth: [birth year] Referring Provider: Donnie Coffin, MD   Encounter Date: 10/27/2017  PT End of Session - 10/27/17 1106    Visit Number  5    Number of Visits  5    Date for PT Re-Evaluation  10/26/17    Authorization Type  [employer] [facility]    PT Start Time  1100 D/C visit, didn't need whole time    PT Stop Time  1135    PT Time Calculation (min)  35 min    Equipment Utilized During Treatment  Gait belt    Activity Tolerance  Patient tolerated treatment well    Behavior During Therapy  [facility] for tasks assessed/performed       Past Medical History:  Diagnosis Date  . Diabetes mellitus without complication (HCC)    diet controlled  . Fibromyalgia   . GERD (gastroesophageal reflux disease)   . Hypercholesterolemia   . Hypertension   . Polyarthritis rheumatica (HCC)    RA  . Shingles   . Stroke ([place])   . Vision impairment    right eye    Past Surgical History:  Procedure Laterality Date  . ABDOMINAL HYSTERECTOMY    . [place] 1  1980's  . PARTIAL KNEE ARTHROPLASTY Left 01/17/2017   Procedure: UNICOMPARTMENTAL LEFT KNEE;  Surgeon: Paralee Cancel, MD;  Location: [facility];  Service: Orthopedics;  Laterality: Left;  . RADIOLOGY WITH ANESTHESIA N/A 08/11/2017   Procedure: MRI OF BRAIN WITH AND WITHOUT CONSTRAST, AND OF THE ORBIT WITH AND WITHOUT CONTRAST;  Surgeon: Radiologist, Medication, MD;  Location: [facility];  Service: Radiology;  Laterality: N/A;  . RADIOLOGY WITH ANESTHESIA N/A 09/06/2017   Procedure: MRI OF BRAIN;  Surgeon: Radiologist, Medication, MD;  Location: [facility];  Service: Radiology;  Laterality: N/A;  . RHINOPLASTY    . TOTAL KNEE ARTHROPLASTY Right 09/30/2015   Procedure: TOTAL RIGHT KNEE  ARTHROPLASTY;  Surgeon: Paralee Cancel, MD;  Location: [facility];  Service: Orthopedics;  Laterality: Right;    There were no vitals filed for this visit.  Subjective Assessment - 10/27/17 1106    Subjective  Pt reports MD visit went well.      Limitations  Walking    Patient Stated Goals  "to be more balance"    Currently in Pain?  No/denies         [facility] PT Assessment - 10/27/17 1109      Ambulation/Gait   Ambulation/Gait  Yes    Ambulation/Gait Assistance  6: Modified independent (Device/Increase time)    Ambulation Distance (Feet)  1000 Feet    Assistive device  None    Gait Pattern  Within Functional Limits    Ambulation Surface  Level;Unlevel;Indoor;Outdoor;Paved;Grass    Stairs  Yes    Stairs Assistance  7: Independent    Stair Management Technique  No rails;Alternating pattern;Forwards    Number of Stairs  4    Height of Stairs  6      Functional Gait  Assessment   Gait assessed   Yes    Gait Level Surface  Walks 20 ft in less than 7 sec but greater than 5.5 sec, uses assistive device, slower speed, mild gait deviations, or deviates 6-10 in outside of the 12 in walkway width.  Change in Gait Speed  Able to smoothly change walking speed without loss of balance or gait deviation. Deviate no more than 6 in outside of the 12 in walkway width.    Gait with Horizontal Head Turns  Performs head turns smoothly with no change in gait. Deviates no more than 6 in outside 12 in walkway width    Gait with Vertical Head Turns  Performs task with slight change in gait velocity (eg, minor disruption to smooth gait path), deviates 6 - 10 in outside 12 in walkway width or uses assistive device    Gait and Pivot Turn  Pivot turns safely within 3 sec and stops quickly with no loss of balance.    Step Over Obstacle  Is able to step over one shoe box (4.5 in total height) without changing gait speed. No evidence of imbalance.    Gait with Narrow Base of Support  Ambulates 4-7 steps.    Gait with  Eyes Closed  Walks 20 ft, no assistive devices, good speed, no evidence of imbalance, normal gait pattern, deviates no more than 6 in outside 12 in walkway width. Ambulates 20 ft in less than 7 sec.    Ambulating Backwards  Walks 20 ft, uses assistive device, slower speed, mild gait deviations, deviates 6-10 in outside 12 in walkway width.    Steps  Alternating feet, no rail.    Total Score  24    FGA comment:  19-24 = medium risk fall                          PT Education - 10/27/17 1106    Education provided  Yes    Education Details  results of goals, encouragement to begin balance class at gym.     Person(s) Educated  Patient    Methods  Explanation    Comprehension  Verbalized understanding       PT Short Term Goals - 09/26/17 1528      PT SHORT TERM GOAL #1   Title  =LTGs        PT Long Term Goals - 10/27/17 1107      PT LONG TERM GOAL #1   Title  Pt will be independent with HEP in order to indicate decreased fall risk.  (Target Date: 10/26/17)    Baseline  10/19/17: Pt verbalized independent with HEP program to decrease fall risk.     Status  Achieved      PT LONG TERM GOAL #2   Title  Pt will improve FGA to >/=25/30 in order to indicate decreased fall risk.     Baseline  24/30 on 10/27/17 (pt reports increased neck pain today with head turns)    Status  Partially Met      PT LONG TERM GOAL #3   Title  Pt will report return to balance exercise class at [facility] in order to indicate safe return to community fitness.     Baseline  Has plans to return next week, but has not done yet due to being early.  She does go to the Center daily for other exercise.     Status  Partially Met      PT LONG TERM GOAL #4   Title  Pt will ambulate over varying outdoor surfaces 1000' at independent level in order to indicate safe return to community and leisure mobility.      Baseline  met 10/27/17  Status  Achieved            Plan - 10/27/17 1107     Clinical Impression Statement  Pt has met 2/4 LTGs however has progressed with FGA score from 22 to 24/30 and plans to return to balance exercise class next week now that therapy is ending.  Pt verbalized understanding of goal assessment and agrees to D/c.    PT Frequency  1x / week    PT Duration  4 weeks    PT Treatment/Interventions  ADLs/Self Care Home Management;Gait training;Stair training;Functional mobility training;Therapeutic activities;Therapeutic exercise;Balance training;Neuromuscular re-education;Patient/family education;Vestibular    PT Next Visit Plan  --    PT Home Exercise Plan  see HEP initiated 09/26/17    Consulted and Agree with Plan of Care  Patient       Patient will benefit from skilled therapeutic intervention in order to improve the following deficits and impairments:     Visit Diagnosis: Unsteadiness on feet  Other abnormalities of gait and mobility  PHYSICAL THERAPY DISCHARGE SUMMARY  Visits from Start of Care: 5  Current functional level related to goals / functional outcomes: See LTGs above   Remaining deficits: Pt continues to have very high level balance deficits, however has HEP to address and is returning to gym balance class.    Education / Equipment: HEP  Plan: Patient agrees to discharge.  Patient goals were partially met. Patient is being discharged due to meeting the stated rehab goals.  ?????        Problem List Patient Active Problem List   Diagnosis Date Noted  . Hyperlipidemia   . Ischemic optic neuropathy of right eye   . Stroke ([place]) 09/04/2017  . Hypertension complicating diabetes ([place]) 09/04/2017  . Diabetes mellitus ([place]) 09/04/2017  . Polymyalgia rheumatica ([place]) 09/04/2017  . Fibromyalgia 09/04/2017  . S/P left UKR 01/17/2017  . Overweight (BMI 25.0-29.9) 10/01/2015  . S/P right TKA 09/30/2015    Cameron Sprang, PT, MPT [facility] [address] [place], [place],  [address] Phone: [phone]   Fax:  [phone] 10/27/17, 11:49 AM  Name: Misty Morris MRN: [id number] Date of Birth: [birth year]

## 2018-01-10 DIAGNOSIS — Z79899 Other long term (current) drug therapy: Secondary | ICD-10-CM | POA: Diagnosis not present

## 2018-01-10 DIAGNOSIS — M1990 Unspecified osteoarthritis, unspecified site: Secondary | ICD-10-CM | POA: Diagnosis not present

## 2018-01-10 DIAGNOSIS — M79644 Pain in right finger(s): Secondary | ICD-10-CM | POA: Diagnosis not present

## 2018-01-10 DIAGNOSIS — M797 Fibromyalgia: Secondary | ICD-10-CM | POA: Diagnosis not present

## 2018-01-10 DIAGNOSIS — M353 Polymyalgia rheumatica: Secondary | ICD-10-CM | POA: Diagnosis not present

## 2018-01-10 DIAGNOSIS — M0579 Rheumatoid arthritis with rheumatoid factor of multiple sites without organ or systems involvement: Secondary | ICD-10-CM | POA: Diagnosis not present

## 2018-01-10 DIAGNOSIS — M8580 Other specified disorders of bone density and structure, unspecified site: Secondary | ICD-10-CM | POA: Diagnosis not present

## 2018-01-23 DIAGNOSIS — H401132 Primary open-angle glaucoma, bilateral, moderate stage: Secondary | ICD-10-CM | POA: Diagnosis not present

## 2018-01-23 DIAGNOSIS — H4710 Unspecified papilledema: Secondary | ICD-10-CM | POA: Diagnosis not present

## 2018-01-25 DIAGNOSIS — M1990 Unspecified osteoarthritis, unspecified site: Secondary | ICD-10-CM | POA: Diagnosis not present

## 2018-01-25 DIAGNOSIS — E785 Hyperlipidemia, unspecified: Secondary | ICD-10-CM | POA: Diagnosis not present

## 2018-01-25 DIAGNOSIS — I10 Essential (primary) hypertension: Secondary | ICD-10-CM | POA: Diagnosis not present

## 2018-01-25 DIAGNOSIS — M353 Polymyalgia rheumatica: Secondary | ICD-10-CM | POA: Diagnosis not present

## 2018-01-25 DIAGNOSIS — E119 Type 2 diabetes mellitus without complications: Secondary | ICD-10-CM | POA: Diagnosis not present

## 2018-01-27 DIAGNOSIS — E119 Type 2 diabetes mellitus without complications: Secondary | ICD-10-CM | POA: Diagnosis not present

## 2018-01-27 DIAGNOSIS — I10 Essential (primary) hypertension: Secondary | ICD-10-CM | POA: Diagnosis not present

## 2018-01-30 DIAGNOSIS — H3090 Unspecified chorioretinal inflammation, unspecified eye: Secondary | ICD-10-CM | POA: Diagnosis not present

## 2018-01-30 DIAGNOSIS — Z7952 Long term (current) use of systemic steroids: Secondary | ICD-10-CM | POA: Diagnosis not present

## 2018-01-30 DIAGNOSIS — E785 Hyperlipidemia, unspecified: Secondary | ICD-10-CM | POA: Diagnosis not present

## 2018-01-30 DIAGNOSIS — Z8673 Personal history of transient ischemic attack (TIA), and cerebral infarction without residual deficits: Secondary | ICD-10-CM | POA: Diagnosis not present

## 2018-01-30 DIAGNOSIS — E119 Type 2 diabetes mellitus without complications: Secondary | ICD-10-CM | POA: Diagnosis not present

## 2018-01-30 DIAGNOSIS — I10 Essential (primary) hypertension: Secondary | ICD-10-CM | POA: Diagnosis not present

## 2018-01-30 DIAGNOSIS — B351 Tinea unguium: Secondary | ICD-10-CM | POA: Diagnosis not present

## 2018-01-30 DIAGNOSIS — Z0000 Encounter for general adult medical examination without abnormal findings: Secondary | ICD-10-CM | POA: Diagnosis not present

## 2018-02-14 ENCOUNTER — Other Ambulatory Visit: Payer: Self-pay

## 2018-02-14 ENCOUNTER — Emergency Department (HOSPITAL_COMMUNITY): Payer: PPO

## 2018-02-14 ENCOUNTER — Observation Stay (HOSPITAL_COMMUNITY)
Admission: EM | Admit: 2018-02-14 | Discharge: 2018-02-15 | Disposition: A | Discharge status: Home / Self Care | Payer: PPO | Attending: Internal Medicine | Admitting: Internal Medicine

## 2018-02-14 ENCOUNTER — Encounter (HOSPITAL_COMMUNITY): Payer: Self-pay | Admitting: Emergency Medicine

## 2018-02-14 DIAGNOSIS — I10 Essential (primary) hypertension: Secondary | ICD-10-CM | POA: Diagnosis not present

## 2018-02-14 DIAGNOSIS — R202 Paresthesia of skin: Secondary | ICD-10-CM | POA: Insufficient documentation

## 2018-02-14 DIAGNOSIS — E785 Hyperlipidemia, unspecified: Secondary | ICD-10-CM

## 2018-02-14 DIAGNOSIS — R262 Difficulty in walking, not elsewhere classified: Secondary | ICD-10-CM | POA: Insufficient documentation

## 2018-02-14 DIAGNOSIS — R531 Weakness: Secondary | ICD-10-CM | POA: Diagnosis not present

## 2018-02-14 DIAGNOSIS — E1159 Type 2 diabetes mellitus with other circulatory complications: Secondary | ICD-10-CM | POA: Diagnosis not present

## 2018-02-14 DIAGNOSIS — M353 Polymyalgia rheumatica: Secondary | ICD-10-CM | POA: Diagnosis not present

## 2018-02-14 DIAGNOSIS — Z96652 Presence of left artificial knee joint: Secondary | ICD-10-CM | POA: Diagnosis not present

## 2018-02-14 DIAGNOSIS — F1210 Cannabis abuse, uncomplicated: Secondary | ICD-10-CM | POA: Diagnosis not present

## 2018-02-14 DIAGNOSIS — Z87891 Personal history of nicotine dependence: Secondary | ICD-10-CM | POA: Diagnosis not present

## 2018-02-14 DIAGNOSIS — E119 Type 2 diabetes mellitus without complications: Secondary | ICD-10-CM

## 2018-02-14 DIAGNOSIS — H53143 Visual discomfort, bilateral: Secondary | ICD-10-CM | POA: Diagnosis present

## 2018-02-14 DIAGNOSIS — Z79899 Other long term (current) drug therapy: Secondary | ICD-10-CM | POA: Insufficient documentation

## 2018-02-14 DIAGNOSIS — Z91040 Latex allergy status: Secondary | ICD-10-CM | POA: Insufficient documentation

## 2018-02-14 DIAGNOSIS — G459 Transient cerebral ischemic attack, unspecified: Secondary | ICD-10-CM | POA: Diagnosis not present

## 2018-02-14 DIAGNOSIS — Z96651 Presence of right artificial knee joint: Secondary | ICD-10-CM | POA: Diagnosis not present

## 2018-02-14 DIAGNOSIS — Z7982 Long term (current) use of aspirin: Secondary | ICD-10-CM | POA: Diagnosis not present

## 2018-02-14 DIAGNOSIS — Z8673 Personal history of transient ischemic attack (TIA), and cerebral infarction without residual deficits: Secondary | ICD-10-CM | POA: Insufficient documentation

## 2018-02-14 HISTORY — DX: Rheumatoid arthritis, unspecified: M06.9

## 2018-02-14 HISTORY — DX: Type 2 diabetes mellitus without complications: E11.9

## 2018-02-14 HISTORY — DX: Transient cerebral ischemic attack, unspecified: G45.9

## 2018-02-14 HISTORY — DX: Polymyalgia rheumatica: M35.3

## 2018-02-14 LAB — COMPREHENSIVE METABOLIC PANEL
ALBUMIN: 3.9 g/dL (ref 3.5–5.0)
ALT: 13 U/L (ref 0–44)
AST: 16 U/L (ref 15–41)
Alkaline Phosphatase: 84 U/L (ref 38–126)
Anion gap: 11 (ref 5–15)
BUN: 14 mg/dL (ref 8–23)
CHLORIDE: 104 mmol/L (ref 98–111)
CO2: 24 mmol/L (ref 22–32)
Calcium: 9.4 mg/dL (ref 8.9–10.3)
Creatinine, Ser: 0.83 mg/dL (ref 0.44–1.00)
GFR calc Af Amer: >60 mL/min (ref >60)
GFR calc non Af Amer: >60 mL/min (ref >60)
GLUCOSE: 193 mg/dL — AB (ref 70–99)
POTASSIUM: 3.6 mmol/L (ref 3.5–5.1)
SODIUM: 139 mmol/L (ref 135–145)
Total Bilirubin: 0.7 mg/dL (ref 0.3–1.2)
Total Protein: 6.7 g/dL (ref 6.5–8.1)

## 2018-02-14 LAB — DIFFERENTIAL
ABS IMMATURE GRANULOCYTES: 0.1 10*3/uL (ref 0.0–0.1)
BASOS ABS: 0 10*3/uL (ref 0.0–0.1)
BASOS PCT: 0 %
Eosinophils Absolute: 0.1 10*3/uL (ref 0.0–0.7)
Eosinophils Relative: 1 %
IMMATURE GRANULOCYTES: 1 %
Lymphocytes Relative: 22 %
Lymphs Abs: 2.1 10*3/uL (ref 0.7–4.0)
Monocytes Absolute: 0.4 10*3/uL (ref 0.1–1.0)
Monocytes Relative: 4 %
NEUTROS PCT: 72 %
Neutro Abs: 6.8 10*3/uL (ref 1.7–7.7)

## 2018-02-14 LAB — URINALYSIS, ROUTINE W REFLEX MICROSCOPIC
BILIRUBIN URINE: NEGATIVE
Glucose, UA: NEGATIVE mg/dL
Hgb urine dipstick: NEGATIVE
KETONES UR: NEGATIVE mg/dL
Nitrite: NEGATIVE
PROTEIN: NEGATIVE mg/dL
Specific Gravity, Urine: >1.046 — ABNORMAL HIGH (ref 1.005–1.030)
pH: 6 (ref 5.0–8.0)

## 2018-02-14 LAB — GLUCOSE, CAPILLARY: Glucose-Capillary: 228 mg/dL — ABNORMAL HIGH (ref 70–99)

## 2018-02-14 LAB — APTT: APTT: 26 s (ref 24–36)

## 2018-02-14 LAB — CBC
HCT: 45.9 % (ref 36.0–46.0)
HEMOGLOBIN: 14.4 g/dL (ref 12.0–15.0)
MCH: 26.5 pg (ref 26.0–34.0)
MCHC: 31.4 g/dL (ref 30.0–36.0)
MCV: 84.4 fL (ref 78.0–100.0)
Platelets: 357 10*3/uL (ref 150–400)
RBC: 5.44 MIL/uL — AB (ref 3.87–5.11)
RDW: 15.2 % (ref 11.5–15.5)
WBC: 9.5 10*3/uL (ref 4.0–10.5)

## 2018-02-14 LAB — RAPID URINE DRUG SCREEN, HOSP PERFORMED
Amphetamines: NOT DETECTED
Benzodiazepines: NOT DETECTED
COCAINE: NOT DETECTED
Opiates: NOT DETECTED
Tetrahydrocannabinol: POSITIVE — AB

## 2018-02-14 LAB — I-STAT CHEM 8, ED
BUN: 16 mg/dL (ref 8–23)
CHLORIDE: 103 mmol/L (ref 98–111)
Calcium, Ion: 1.2 mmol/L (ref 1.15–1.40)
Creatinine, Ser: 0.7 mg/dL (ref 0.44–1.00)
Glucose, Bld: 197 mg/dL — ABNORMAL HIGH (ref 70–99)
HEMATOCRIT: 44 % (ref 36.0–46.0)
Hemoglobin: 15 g/dL (ref 12.0–15.0)
Potassium: 3.6 mmol/L (ref 3.5–5.1)
Sodium: 140 mmol/L (ref 135–145)
TCO2: 23 mmol/L (ref 22–32)

## 2018-02-14 LAB — I-STAT TROPONIN, ED: Troponin i, poc: 0 ng/mL (ref 0.00–0.08)

## 2018-02-14 LAB — CBG MONITORING, ED
GLUCOSE-CAPILLARY: 170 mg/dL — AB (ref 70–99)
Glucose-Capillary: 120 mg/dL — ABNORMAL HIGH (ref 70–99)

## 2018-02-14 LAB — PROTIME-INR
INR: 0.99
Prothrombin Time: 13 seconds (ref 11.4–15.2)

## 2018-02-14 MED ORDER — ACETAMINOPHEN 325 MG PO TABS: 650.0000 mg | ORAL_TABLET | ORAL | Status: DC | PRN

## 2018-02-14 MED ORDER — ENOXAPARIN SODIUM 40 MG/0.4ML ~~LOC~~ SOLN
40.0000 mg | SUBCUTANEOUS | Status: DC
  Filled 2018-02-14: qty 0.4

## 2018-02-14 MED ORDER — LATANOPROST 0.005 % OP SOLN
1.0000 [drp] | Freq: Every day | OPHTHALMIC | Status: DC
  Filled 2018-02-14: qty 2.5

## 2018-02-14 MED ORDER — ACETAMINOPHEN 650 MG RE SUPP: 650.0000 mg | RECTAL | Status: DC | PRN

## 2018-02-14 MED ORDER — STROKE: EARLY STAGES OF RECOVERY BOOK
Freq: Once | Status: AC
  Administered 2018-02-14: 18:00:00
  Filled 2018-02-14: qty 1

## 2018-02-14 MED ORDER — IOPAMIDOL (ISOVUE-370) INJECTION 76%
50.0000 mL | Freq: Once | INTRAVENOUS | Status: AC | PRN
  Administered 2018-02-14: 50 mL via INTRAVENOUS

## 2018-02-14 MED ORDER — ACETYLCARN-ALPHA LIPOIC ACID 400-200 MG PO CAPS: 1.0000 | ORAL_CAPSULE | Freq: Two times a day (BID) | ORAL | Status: DC

## 2018-02-14 MED ORDER — CLOPIDOGREL BISULFATE 75 MG PO TABS
300.0000 mg | ORAL_TABLET | Freq: Once | ORAL | Status: AC
  Administered 2018-02-14: 300 mg via ORAL
  Filled 2018-02-14: qty 4

## 2018-02-14 MED ORDER — IOPAMIDOL (ISOVUE-370) INJECTION 76%
INTRAVENOUS | Status: AC
  Filled 2018-02-14: qty 50

## 2018-02-14 MED ORDER — AMLODIPINE BESYLATE 5 MG PO TABS
5.0000 mg | ORAL_TABLET | Freq: Every evening | ORAL | Status: DC
  Administered 2018-02-14: 5 mg via ORAL
  Filled 2018-02-14: qty 1

## 2018-02-14 MED ORDER — INSULIN ASPART 100 UNIT/ML ~~LOC~~ SOLN: 0.0000 [IU] | Freq: Three times a day (TID) | SUBCUTANEOUS | Status: DC

## 2018-02-14 MED ORDER — ACETAMINOPHEN 160 MG/5ML PO SOLN: 650.0000 mg | ORAL | Status: DC | PRN

## 2018-02-14 MED ORDER — PREDNISONE 5 MG PO TABS
10.0000 mg | ORAL_TABLET | Freq: Every evening | ORAL | Status: DC
  Administered 2018-02-14: 10 mg via ORAL
  Filled 2018-02-14: qty 2

## 2018-02-14 MED ORDER — TIMOLOL MALEATE 0.5 % OP SOLN
1.0000 [drp] | Freq: Every day | OPHTHALMIC | Status: DC
  Filled 2018-02-14: qty 5

## 2018-02-14 MED ORDER — SENNOSIDES-DOCUSATE SODIUM 8.6-50 MG PO TABS: 1.0000 | ORAL_TABLET | Freq: Every evening | ORAL | Status: DC | PRN

## 2018-02-14 MED ORDER — INSULIN ASPART 100 UNIT/ML ~~LOC~~ SOLN: 0.0000 [IU] | Freq: Every day | SUBCUTANEOUS | Status: DC

## 2018-02-14 MED ORDER — ASPIRIN 325 MG PO TABS
325.0000 mg | ORAL_TABLET | Freq: Every day | ORAL | Status: DC
  Administered 2018-02-15: 325 mg via ORAL
  Filled 2018-02-14: qty 1

## 2018-02-14 MED ORDER — CLOPIDOGREL BISULFATE 75 MG PO TABS
75.0000 mg | ORAL_TABLET | Freq: Every day | ORAL | Status: DC
  Administered 2018-02-15: 75 mg via ORAL
  Filled 2018-02-14: qty 1

## 2018-02-14 MED ORDER — ATORVASTATIN CALCIUM 80 MG PO TABS
80.0000 mg | ORAL_TABLET | Freq: Every day | ORAL | Status: DC
  Filled 2018-02-14: qty 1

## 2018-02-14 NOTE — ED Notes (Signed)
CT staff made aware of IV placement - states she is next on list to be scanned

## 2018-02-14 NOTE — Progress Notes (Addendum)
Patient refuses insulin and all other medications, is refusing bed alarm and has a purse full of medications that I can not verify and will not allow me to take them to the pharmacy per our protocol. I will send attending an AMION text to inform her of the situation.

## 2018-02-14 NOTE — Progress Notes (Signed)
Pt admitted to the unit from ED; report received at 1650 and pt arrived to the unit at 1715. Pt A&O x4; MAE x4; Skin clean dry and intact with no pressure ulcers or opened wounds noted. Pt oriented to the unit and room; fall/safety precaution and prevention education completed with pt and she voices understanding. Pt refused bed alarm; stating "I can't sit still in bed; I need to be able to move around". Pt noted to have two medication boxes filled with meds and after educating pt about hospital policy and the need for medication to be sent to pharmacy for storage or family to take it home pt informed this note writer "You not touching or taking my stuff! I don't have any family here to take it home and am not giving you anything. If not then I will go home". Charge RN Olivia Mackie notified; pt bed alarm off as requested by pt; pt refused her ordered VTE Lovenox. Telemetry applied and verified with CCMD; NT called to second verify with NT. VSS; call light within reach. Will continue to monitor pt closely. Delia Heady RN

## 2018-02-14 NOTE — ED Triage Notes (Signed)
Pt states she went to bed at 11 this pm last night and  Got up  At 730 and noticed she had blurry vision and she did not feel right , tingling in both arms , rt side worse than left and rt leg feels weak. States has hx of stroke , VAN is neg

## 2018-02-14 NOTE — ED Provider Notes (Signed)
[facility] EMERGENCY DEPARTMENT Provider Note   CSN: [id number] Arrival date & time: 02/14/18  7353     History   Chief Complaint Chief Complaint  Patient presents with  . Cerebrovascular Accident    HPI Misty Morris is a 67 y.o. female.  HPI  67 year old female presents with concern for stroke.  She had a stroke in January and now is having similar symptoms.  She has no residual deficits from that prior strokes today for small right shoulder numbness.  When she woke up this morning around 7 AM she noticed that her vision was splotchy and dark.  It was like the lights were turned off despite them being on.  She had trouble walking.  She also noticed some tingling to her right arm and right leg.  Does not feel weak necessarily.  She has an odd sensation in her head but states is not really a headache.  This is very similar to when she had the stroke.  No chest pain or neck pain.  Symptoms are significantly improving but not resolved.  She states she has retinal problems and has chronic left-sided vision problems.  Past Medical History:  Diagnosis Date  . Diabetes mellitus without complication (HCC)    diet controlled  . Fibromyalgia   . GERD (gastroesophageal reflux disease)   . Hypercholesterolemia   . Hypertension   . Polyarthritis rheumatica (HCC)    RA  . Shingles   . Stroke ([place])   . Vision impairment    right eye    Patient Active Problem List   Diagnosis Date Noted  . Hyperlipidemia   . Ischemic optic neuropathy of right eye   . Stroke ([place]) 09/04/2017  . Hypertension complicating diabetes ([place]) 09/04/2017  . Diabetes mellitus ([place]) 09/04/2017  . Polymyalgia rheumatica ([place]) 09/04/2017  . Fibromyalgia 09/04/2017  . S/P left UKR 01/17/2017  . Overweight (BMI 25.0-29.9) 10/01/2015  . S/P right TKA 09/30/2015    Past Surgical History:  Procedure Laterality Date  . ABDOMINAL HYSTERECTOMY    . [place] 1  1980's  . PARTIAL KNEE  ARTHROPLASTY Left 01/17/2017   Procedure: UNICOMPARTMENTAL LEFT KNEE;  Surgeon: Paralee Cancel, MD;  Location: [facility];  Service: Orthopedics;  Laterality: Left;  . RADIOLOGY WITH ANESTHESIA N/A 08/11/2017   Procedure: MRI OF BRAIN WITH AND WITHOUT CONSTRAST, AND OF THE ORBIT WITH AND WITHOUT CONTRAST;  Surgeon: Radiologist, Medication, MD;  Location: [facility];  Service: Radiology;  Laterality: N/A;  . RADIOLOGY WITH ANESTHESIA N/A 09/06/2017   Procedure: MRI OF BRAIN;  Surgeon: Radiologist, Medication, MD;  Location: [facility];  Service: Radiology;  Laterality: N/A;  . RHINOPLASTY    . TOTAL KNEE ARTHROPLASTY Right 09/30/2015   Procedure: TOTAL RIGHT KNEE ARTHROPLASTY;  Surgeon: Paralee Cancel, MD;  Location: [facility];  Service: Orthopedics;  Laterality: Right;     OB History   None      Home Medications    Prior to Admission medications   Medication Sig Start Date End Date Taking? Authorizing Provider  Acetylcarn-Alpha Lipoic Acid 400-200 MG CAPS Take 1 capsule by mouth 2 (two) times daily.   Yes [provider]  amLODipine (NORVASC) 5 MG tablet Take 5 mg by mouth every evening.  08/20/15  Yes [provider]  aspirin 325 MG tablet Take 1 tablet (325 mg total) by mouth daily. 09/08/17  Yes Geradine Girt, DO  Calcium Carb-Cholecalciferol (CALCIUM 1000 + D PO) Take 1 tablet by mouth  every evening.   Yes [provider]  Cholecalciferol (VITAMIN D) 2000 units CAPS Take 4,000 Units by mouth daily.   Yes [provider]  Chromium Picolinate 200 MCG CAPS Take 200 mcg by mouth daily.   Yes [provider]  Coenzyme Q10 (COQ-10) 100 MG CAPS Take 100 mg by mouth daily.   Yes [provider]  diphenhydrAMINE (BENADRYL) 25 MG tablet Take 25 mg by mouth at bedtime.   Yes [provider]  Lysine 500 MG CAPS Take 500 mg by mouth every evening.   Yes [provider]  Omega-3 Fatty Acids (FISH OIL) 1200 MG CAPS Take 1,200 mg 2 (two) times daily by  mouth.   Yes [provider]  predniSONE (DELTASONE) 10 MG tablet Take 10 mg by mouth every evening.    Yes [provider]  TIMOPTIC OCUDOSE 0.5 % SOLN Place 1 drop into both eyes daily. Preservative free only 08/18/17  Yes [provider]  ZIOPTAN 0.0015 % SOLN Place 1 drop into both eyes at bedtime. 07/23/15  Yes [provider]    Family History Family History  Problem Relation Age of Onset  . Hypertension Mother   . Seizures Mother   . Hypertension Father   . Hypertension Sister   . Pancreatic cancer Other     Social History Social History   Tobacco Use  . Smoking status: Former Smoker    Types: Cigarettes    Last attempt to quit: 12/27/2011    Years since quitting: 6.1  . Smokeless tobacco: Never Used  Substance Use Topics  . Alcohol use: No  . Drug use: Yes    Types: Marijuana     Allergies   Codeine; Demerol [meperidine]; Latex; Morphine and related; Sulfa antibiotics; Tramadol; and Vicodin [hydrocodone-acetaminophen]   Review of Systems Review of Systems  Eyes: Positive for visual disturbance.  Cardiovascular: Negative for chest pain.  Gastrointestinal: Negative for vomiting.  Neurological: Positive for weakness and numbness. Negative for headaches.  All other systems reviewed and are negative.    Physical Exam Updated Vital Signs BP (!) 170/90 (BP Location: Right Arm)   Pulse 67   Temp 98.5 F (36.9 C) (Oral)   Resp 14   SpO2 95%   Physical Exam  Constitutional: She is oriented to person, place, and time. She appears well-developed and well-nourished. No distress.  HENT:  Head: Normocephalic and atraumatic.  Right Ear: External ear normal.  Left Ear: External ear normal.  Nose: Nose normal.  Eyes: Pupils are equal, round, and reactive to light. EOM are normal. Right eye exhibits no discharge. Left eye exhibits no discharge.  Left upper outer visual field deficit, chronic per patient  Cardiovascular: Normal  rate, regular rhythm and normal heart sounds.  Pulmonary/Chest: Effort normal and breath sounds normal.  Abdominal: Soft. There is no tenderness.  Neurological: She is alert and oriented to person, place, and time.  CN 3-12 grossly intact. 5/5 strength in all 4 extremities but slightly weaker on RLE. Normal gross sensation save for mild tingling induced by light touch to lateral right arm  Skin: Skin is warm and dry. She is not diaphoretic.  Nursing note and vitals reviewed.    ED Treatments / Results  Labs (all labs ordered are listed, but only abnormal results are displayed) Labs Reviewed  CBC - Abnormal; Notable for the following components:      Result Value   RBC 5.44 (*)    All other components within normal  limits  COMPREHENSIVE METABOLIC PANEL - Abnormal; Notable for the following components:   Glucose, Bld 193 (*)    All other components within normal limits  URINALYSIS, ROUTINE W REFLEX MICROSCOPIC - Abnormal; Notable for the following components:   Specific Gravity, Urine >1.046 (*)    Leukocytes, UA SMALL (*)    Bacteria, UA RARE (*)    All other components within normal limits  RAPID URINE DRUG SCREEN, HOSP PERFORMED - Abnormal; Notable for the following components:   Tetrahydrocannabinol POSITIVE (*)    Barbiturates   (*)    Value: Result not available. Reagent lot number recalled by manufacturer.   All other components within normal limits  CBG MONITORING, ED - Abnormal; Notable for the following components:   Glucose-Capillary 170 (*)    All other components within normal limits  I-STAT CHEM 8, ED - Abnormal; Notable for the following components:   Glucose, Bld 197 (*)    All other components within normal limits  PROTIME-INR  APTT  DIFFERENTIAL  I-STAT TROPONIN, ED    EKG EKG Interpretation  Date/Time:  Tuesday February 14 2018 10:46:39 EDT Ventricular Rate:  65 PR Interval:  128 QRS Duration: 129 QT Interval:  404 QTC Calculation: 420 R  Axis:   60 Text Interpretation:  Sinus rhythm Right bundle branch block no significant change from prior Confirmed by Sherwood Gambler [phone]) on 02/14/2018 2:15:03 PM   Radiology Ct Angio Head W Or Wo Contrast  Result Date: 02/14/2018 CLINICAL DATA:  Blurred vision and tingling in both arms upon wakening this morning. EXAM: CT ANGIOGRAPHY HEAD AND NECK TECHNIQUE: Multidetector CT imaging of the head and neck was performed using the standard protocol during bolus administration of intravenous contrast. Multiplanar CT image reconstructions and MIPs were obtained to evaluate the vascular anatomy. Carotid stenosis measurements (when applicable) are obtained utilizing NASCET criteria, using the distal internal carotid diameter as the denominator. CONTRAST:  76mL ISOVUE-370 IOPAMIDOL (ISOVUE-370) INJECTION 76% COMPARISON:  CT 09/07/2017.  MRI 09/06/2017. FINDINGS: CT HEAD FINDINGS Brain: Brainstem and cerebellum appear within normal limits. There is old infarction in the left PCA territory affecting the left occipital lobe and left thalamus. No sign of acute infarction, mass lesion, hemorrhage, hydrocephalus or extra-axial collection. Vascular: There is atherosclerotic calcification of the major vessels at the base of the brain. Skull: Negative Sinuses: Clear/normal.  Previous maxillary sinus reconstruction. Orbits: Negative Review of the MIP images confirms the above findings CTA NECK FINDINGS Aortic arch: Atherosclerosis of the aortic arch. No aneurysm or dissection. Branching pattern of the brachiocephalic vessels from the arch is normal. Right carotid system: Common carotid artery is widely patent to the bifurcation region. There is calcified plaque at the carotid bifurcation but no stenosis. Cervical ICA is widely patent to the skull base. Left carotid system: Common carotid artery is tortuous but widely patent to the bifurcation. At the carotid bifurcation, there is calcified plaque but no stenosis or  irregularity. Cervical ICA is tortuous but patent beyond that. Vertebral arteries: Right vertebral artery is dominant. Both vertebral artery origins are widely patent. Both vertebral arteries are widely patent through the cervical region to the foramen magnum. Skeleton: Ordinary mid cervical spondylosis. Other neck: There is a calcified thyroid mass on the left with multiple enlarged and partially calcified lymph nodes in the left neck levels 2, 3 and 4. There is probably also some involvement in the superior mediastinum. Pattern is quite likely to represent metastatic thyroid carcinoma. Upper chest: Upper lungs are clear. Review  of the MIP images confirms the above findings CTA HEAD FINDINGS Anterior circulation: Both internal carotid arteries are patent through the skull base and siphon regions. There is mild atherosclerotic calcification in the carotid siphon regions but no stenosis. The anterior and middle cerebral vessels are patent without proximal stenosis, aneurysm or vascular malformation. More distal branch vessels show atherosclerotic irregularity. Posterior circulation: Both vertebral arteries are patent at the foramen magnum level. The vertebrobasilar vessels are quite tortuous and arm affected by atherosclerotic disease. The V4 segment on the right shows atherosclerotic change in narrowing of 30-40%. The V4 segment on the left shows atherosclerotic irregularity but no focal stenosis. The basilar artery shows atherosclerotic plaque but no stenosis. The superior cerebellar and posterior cerebral arteries are patent proximally. More distal PCA branches show marked stenosis and irregularity on each side, similar to the MR angiogram of earlier this year. Venous sinuses: Patent and normal. Anatomic variants: None significant Delayed phase: No abnormal enhancement Review of the MIP images confirms the above findings IMPRESSION: Head CT does not show any acute infarction. Old ischemic changes seen in the left  PCA territory. CT angiography of the neck vessels shows atherosclerosis but no stenosis at either carotid bifurcation. No significant posterior circulation finding in the neck. Intracranial CT angiography shows intracranial atherosclerotic change, most pronounced affecting the posterior cerebral artery branches but also present within the anterior and middle cerebral artery branches. The appearance is similar to an MR angiogram done earlier this year. There is tortuosity and atherosclerotic change of the distal vertebral arteries in the proximal basilar artery. Maximal stenosis of the distal right vertebral is 30-40%. Calcified left thyroid mass with multiple enlarged lymph nodes in the left side of the neck with calcification, the pattern likely to indicate metastatic thyroid carcinoma. Electronically Signed   By: [name] M.D.   On: 02/14/2018 14:33   Ct Angio Neck W Or Wo Contrast  Result Date: 02/14/2018 CLINICAL DATA:  Blurred vision and tingling in both arms upon wakening this morning. EXAM: CT ANGIOGRAPHY HEAD AND NECK TECHNIQUE: Multidetector CT imaging of the head and neck was performed using the standard protocol during bolus administration of intravenous contrast. Multiplanar CT image reconstructions and MIPs were obtained to evaluate the vascular anatomy. Carotid stenosis measurements (when applicable) are obtained utilizing NASCET criteria, using the distal internal carotid diameter as the denominator. CONTRAST:  65mL ISOVUE-370 IOPAMIDOL (ISOVUE-370) INJECTION 76% COMPARISON:  CT 09/07/2017.  MRI 09/06/2017. FINDINGS: CT HEAD FINDINGS Brain: Brainstem and cerebellum appear within normal limits. There is old infarction in the left PCA territory affecting the left occipital lobe and left thalamus. No sign of acute infarction, mass lesion, hemorrhage, hydrocephalus or extra-axial collection. Vascular: There is atherosclerotic calcification of the major vessels at the base of the brain. Skull:  Negative Sinuses: Clear/normal.  Previous maxillary sinus reconstruction. Orbits: Negative Review of the MIP images confirms the above findings CTA NECK FINDINGS Aortic arch: Atherosclerosis of the aortic arch. No aneurysm or dissection. Branching pattern of the brachiocephalic vessels from the arch is normal. Right carotid system: Common carotid artery is widely patent to the bifurcation region. There is calcified plaque at the carotid bifurcation but no stenosis. Cervical ICA is widely patent to the skull base. Left carotid system: Common carotid artery is tortuous but widely patent to the bifurcation. At the carotid bifurcation, there is calcified plaque but no stenosis or irregularity. Cervical ICA is tortuous but patent beyond that. Vertebral arteries: Right vertebral artery is dominant. Both vertebral artery  origins are widely patent. Both vertebral arteries are widely patent through the cervical region to the foramen magnum. Skeleton: Ordinary mid cervical spondylosis. Other neck: There is a calcified thyroid mass on the left with multiple enlarged and partially calcified lymph nodes in the left neck levels 2, 3 and 4. There is probably also some involvement in the superior mediastinum. Pattern is quite likely to represent metastatic thyroid carcinoma. Upper chest: Upper lungs are clear. Review of the MIP images confirms the above findings CTA HEAD FINDINGS Anterior circulation: Both internal carotid arteries are patent through the skull base and siphon regions. There is mild atherosclerotic calcification in the carotid siphon regions but no stenosis. The anterior and middle cerebral vessels are patent without proximal stenosis, aneurysm or vascular malformation. More distal branch vessels show atherosclerotic irregularity. Posterior circulation: Both vertebral arteries are patent at the foramen magnum level. The vertebrobasilar vessels are quite tortuous and arm affected by atherosclerotic disease. The V4  segment on the right shows atherosclerotic change in narrowing of 30-40%. The V4 segment on the left shows atherosclerotic irregularity but no focal stenosis. The basilar artery shows atherosclerotic plaque but no stenosis. The superior cerebellar and posterior cerebral arteries are patent proximally. More distal PCA branches show marked stenosis and irregularity on each side, similar to the MR angiogram of earlier this year. Venous sinuses: Patent and normal. Anatomic variants: None significant Delayed phase: No abnormal enhancement Review of the MIP images confirms the above findings IMPRESSION: Head CT does not show any acute infarction. Old ischemic changes seen in the left PCA territory. CT angiography of the neck vessels shows atherosclerosis but no stenosis at either carotid bifurcation. No significant posterior circulation finding in the neck. Intracranial CT angiography shows intracranial atherosclerotic change, most pronounced affecting the posterior cerebral artery branches but also present within the anterior and middle cerebral artery branches. The appearance is similar to an MR angiogram done earlier this year. There is tortuosity and atherosclerotic change of the distal vertebral arteries in the proximal basilar artery. Maximal stenosis of the distal right vertebral is 30-40%. Calcified left thyroid mass with multiple enlarged lymph nodes in the left side of the neck with calcification, the pattern likely to indicate metastatic thyroid carcinoma. Electronically Signed   By: [name] M.D.   On: 02/14/2018 14:33    Procedures Procedures (including critical care time)  Medications Ordered in ED Medications  iopamidol (ISOVUE-370) 76 % injection (has no administration in time range)  iopamidol (ISOVUE-370) 76 % injection 50 mL (50 mLs Intravenous Contrast Given 02/14/18 1237)     Initial Impression / Assessment and Plan / ED Course  I have reviewed the triage vital signs and the nursing  notes.  Pertinent labs & imaging results that were available during my care of the patient were reviewed by me and considered in my medical decision making (see chart for details).  Clinical Course as of Feb 14 1521  Tue Feb 14, 2018  1004 Discussed with Dr. Leonel Ramsay.  While she is having some symptoms these have significantly improved.  Given no obvious LVO on last presentation and with improving and mild symptoms now, will not call code stroke but instead get CT and CT angiogram of head and neck.  Neurology will consult.   [SG]    Clinical Course User Index [SG] Sherwood Gambler, MD    Patient symptoms have essentially resolved.  Neurology will treat as a TIA.  Statin will be added.  She will need admission to the  hospital for further stroke/TIA work-up.  Dr. Tamala Julian to admit.  Final Clinical Impressions(s) / ED Diagnoses   Final diagnoses:  TIA (transient ischemic attack)    ED Discharge Orders    None       Sherwood Gambler, MD 02/14/18 1523

## 2018-02-14 NOTE — ED Notes (Signed)
Up to restroom to obtain urine specimen - no difficulties ambulating

## 2018-02-14 NOTE — H&P (Signed)
History and Physical    Misty Morris WCH:[id number] DOB: 1950/09/02 DOA: 02/14/2018  Referring MD/NP/PA: Dr. Sherwood Gambler palate PCP: Alroy Dust, L.Marlou Sa, MD  Patient coming from: Home via EMS  Chief Complaint: Decreased vision  I have personally briefly reviewed patient's old medical records in [place]   HPI: Misty Morris is a 67 y.o. female with medical history significant of HTN, HLD, DM, retin pigmentation, CVA, PMR, and GERD; who presents with complaints of decreased vision upon waking up this morning around 7:30 AM.  At baseline she reports having decreased vision out of her right eye.  She describes patient mostly out of the left eye being more dark and blurry than usual.  Associated symptoms include complaints of right-sided numbness tingling sensation that felt similar to her previous stroke which was in January.  She reports taking full dose aspirin daily as prescribed, but had not been placed on any statin it seeems at the patient request.  Patient has made dietary changes including decreasing fats since her last stroke.  She admits to smoking marijuana, but denies any other illicit drug use.  ED Course: Patient presented as a code stroke and was evaluated by neurology.  Upon admission into the emergency department patient was seen to be afebrile, blood pressure 170/90, and all other vital signs maintained.  Labs revealed glucose of 193, but otherwise are unremarkable.  UDS positive for marijuana.  CT angiogram of the head and neck did not show any acute abnormalities.  TRH called to admit to complete stroke work-up.  No intervention was recommended as patient reported improvement of symptoms starting at around 11:30 AM.     Review of Systems  Constitutional: Negative for chills, fever and malaise/fatigue.  HENT: Negative for ear pain.   Eyes: Positive for blurred vision. Negative for photophobia.       Positive for vision changes  Respiratory: Negative for shortness  of breath and stridor.   Cardiovascular: Negative for chest pain and orthopnea.  Gastrointestinal: Negative for nausea and vomiting.  Genitourinary: Negative for dysuria and hematuria.  Musculoskeletal: Positive for joint pain. Negative for falls.  Neurological: Positive for sensory change. Negative for weakness.  Endo/Heme/Allergies: Negative for polydipsia.  Psychiatric/Behavioral: Positive for substance abuse. Negative for hallucinations.    Past Medical History:  Diagnosis Date  . Diabetes mellitus without complication (HCC)    diet controlled  . Fibromyalgia   . GERD (gastroesophageal reflux disease)   . Hypercholesterolemia   . Hypertension   . Polyarthritis rheumatica (HCC)    RA  . Shingles   . Stroke ([place])   . Vision impairment    right eye    Past Surgical History:  Procedure Laterality Date  . ABDOMINAL HYSTERECTOMY    . [place] 1  1980's  . PARTIAL KNEE ARTHROPLASTY Left 01/17/2017   Procedure: UNICOMPARTMENTAL LEFT KNEE;  Surgeon: Paralee Cancel, MD;  Location: [facility];  Service: Orthopedics;  Laterality: Left;  . RADIOLOGY WITH ANESTHESIA N/A 08/11/2017   Procedure: MRI OF BRAIN WITH AND WITHOUT CONSTRAST, AND OF THE ORBIT WITH AND WITHOUT CONTRAST;  Surgeon: Radiologist, Medication, MD;  Location: [facility];  Service: Radiology;  Laterality: N/A;  . RADIOLOGY WITH ANESTHESIA N/A 09/06/2017   Procedure: MRI OF BRAIN;  Surgeon: Radiologist, Medication, MD;  Location: [facility];  Service: Radiology;  Laterality: N/A;  . RHINOPLASTY    . TOTAL KNEE ARTHROPLASTY Right 09/30/2015   Procedure: TOTAL RIGHT KNEE ARTHROPLASTY;  Surgeon: Paralee Cancel, MD;  Location:  [facility];  Service: Orthopedics;  Laterality: Right;     reports that she quit smoking about 6 years ago. Her smoking use included cigarettes. She has never used smokeless tobacco. She reports that she has current or past drug history. Drug: Marijuana. She reports that she does not drink alcohol.  Allergies  Allergen  Reactions  . Codeine Hives  . Demerol [Meperidine] Hives  . Latex Dermatitis and Other (See Comments)    Blistering   . Morphine And Related Hives  . Sulfa Antibiotics Hives  . Tramadol Hives  . Vicodin [Hydrocodone-Acetaminophen] Itching    Elevated blood pressure. Tolerates low doses.    Family History  Problem Relation Age of Onset  . Hypertension Mother   . Seizures Mother   . Hypertension Father   . Hypertension Sister   . Pancreatic cancer Other     Prior to Admission medications   Medication Sig Start Date End Date Taking? Authorizing Provider  Acetylcarn-Alpha Lipoic Acid 400-200 MG CAPS Take 1 capsule by mouth 2 (two) times daily.   Yes [provider]  amLODipine (NORVASC) 5 MG tablet Take 5 mg by mouth every evening.  08/20/15  Yes [provider]  aspirin 325 MG tablet Take 1 tablet (325 mg total) by mouth daily. 09/08/17  Yes Geradine Girt, DO  Calcium Carb-Cholecalciferol (CALCIUM 1000 + D PO) Take 1 tablet by mouth every evening.   Yes [provider]  Cholecalciferol (VITAMIN D) 2000 units CAPS Take 4,000 Units by mouth daily.   Yes [provider]  Chromium Picolinate 200 MCG CAPS Take 200 mcg by mouth daily.   Yes [provider]  Coenzyme Q10 (COQ-10) 100 MG CAPS Take 100 mg by mouth daily.   Yes [provider]  diphenhydrAMINE (BENADRYL) 25 MG tablet Take 25 mg by mouth at bedtime.   Yes [provider]  Lysine 500 MG CAPS Take 500 mg by mouth every evening.   Yes [provider]  Omega-3 Fatty Acids (FISH OIL) 1200 MG CAPS Take 1,200 mg 2 (two) times daily by mouth.   Yes [provider]  predniSONE (DELTASONE) 10 MG tablet Take 10 mg by mouth every evening.    Yes [provider]  TIMOPTIC OCUDOSE 0.5 % SOLN Place 1 drop into both eyes daily. Preservative free only 08/18/17  Yes [provider]  ZIOPTAN 0.0015 % SOLN Place 1 drop into both eyes at bedtime.  07/23/15  Yes [provider]    Physical Exam:  Constitutional: NAD, calm, comfortable Vitals:   02/14/18 0957  BP: (!) 170/90  Pulse: 67  Resp: 14  Temp: 98.5 F (36.9 C)  TempSrc: Oral  SpO2: 95%   Eyes: PERRL, lids and conjunctivae normal ENMT: Mucous membranes are moist. Posterior pharynx clear of any exudate or lesions. Neck: normal, supple, no masses, no thyromegaly Respiratory: clear to auscultation bilaterally, no wheezing, no crackles. Normal respiratory effort. No accessory muscle use.  Cardiovascular: Regular rate and rhythm, no murmurs / rubs / gallops. No extremity edema. 2+ pedal pulses. No carotid bruits.  Abdomen: no tenderness, no masses palpated. No hepatosplenomegaly. Bowel sounds positive.  Musculoskeletal: no clubbing / cyanosis. No joint deformity upper and lower extremities. Good ROM, no contractures. Normal muscle tone.  Skin: no rashes, lesions, ulcers. No induration Neurologic: CN 2-12 grossly intact. Sensation intact, DTR normal. Strength 5/5 in all 4.  Psychiatric: Normal judgment and insight. Alert and oriented x 3. Normal mood.  Labs on Admission: I have personally reviewed following labs and imaging studies  CBC: Recent Labs  Lab 02/14/18 0938 02/14/18 0947  WBC 9.5  --   NEUTROABS 6.8  --   HGB 14.4 15.0  HCT 45.9 44.0  MCV 84.4  --   PLT 357  --    Basic Metabolic Panel: Recent Labs  Lab 02/14/18 0938 02/14/18 0947  NA 139 140  K 3.6 3.6  CL 104 103  CO2 24  --   GLUCOSE 193* 197*  BUN 14 16  CREATININE 0.83 0.70  CALCIUM 9.4  --    GFR: CrCl cannot be calculated (Unknown ideal weight.). Liver Function Tests: Recent Labs  Lab 02/14/18 0938  AST 16  ALT 13  ALKPHOS 84  BILITOT 0.7  PROT 6.7  ALBUMIN 3.9   No results for input(s): LIPASE, AMYLASE in the last 168 hours. No results for input(s): AMMONIA in the last 168 hours. Coagulation Profile: Recent Labs  Lab 02/14/18 0938  INR 0.99   Cardiac  Enzymes: No results for input(s): CKTOTAL, CKMB, CKMBINDEX, TROPONINI in the last 168 hours. BNP (last 3 results) No results for input(s): PROBNP in the last 8760 hours. HbA1C: No results for input(s): HGBA1C in the last 72 hours. CBG: Recent Labs  Lab 02/14/18 1006  GLUCAP 170*   Lipid Profile: No results for input(s): CHOL, HDL, LDLCALC, TRIG, CHOLHDL, LDLDIRECT in the last 72 hours. Thyroid Function Tests: No results for input(s): TSH, T4TOTAL, FREET4, T3FREE, THYROIDAB in the last 72 hours. Anemia Panel: No results for input(s): VITAMINB12, FOLATE, FERRITIN, TIBC, IRON, RETICCTPCT in the last 72 hours. Urine analysis:    Component Value Date/Time   COLORURINE YELLOW 02/14/2018 1324   APPEARANCEUR CLEAR 02/14/2018 1324   LABSPEC >1.046 (H) 02/14/2018 1324   PHURINE 6.0 02/14/2018 1324   GLUCOSEU NEGATIVE 02/14/2018 1324   HGBUR NEGATIVE 02/14/2018 1324   BILIRUBINUR NEGATIVE 02/14/2018 1324   KETONESUR NEGATIVE 02/14/2018 1324   PROTEINUR NEGATIVE 02/14/2018 1324   UROBILINOGEN 0.2 06/04/2011 1034   NITRITE NEGATIVE 02/14/2018 1324   LEUKOCYTESUR SMALL (A) 02/14/2018 1324   Sepsis Labs: No results found for this or any previous visit (from the past 240 hour(s)).   Radiological Exams on Admission: Ct Angio Head W Or Wo Contrast  Result Date: 02/14/2018 CLINICAL DATA:  Blurred vision and tingling in both arms upon wakening this morning. EXAM: CT ANGIOGRAPHY HEAD AND NECK TECHNIQUE: Multidetector CT imaging of the head and neck was performed using the standard protocol during bolus administration of intravenous contrast. Multiplanar CT image reconstructions and MIPs were obtained to evaluate the vascular anatomy. Carotid stenosis measurements (when applicable) are obtained utilizing NASCET criteria, using the distal internal carotid diameter as the denominator. CONTRAST:  8mL ISOVUE-370 IOPAMIDOL (ISOVUE-370) INJECTION 76% COMPARISON:  CT 09/07/2017.  MRI 09/06/2017. FINDINGS:  CT HEAD FINDINGS Brain: Brainstem and cerebellum appear within normal limits. There is old infarction in the left PCA territory affecting the left occipital lobe and left thalamus. No sign of acute infarction, mass lesion, hemorrhage, hydrocephalus or extra-axial collection. Vascular: There is atherosclerotic calcification of the major vessels at the base of the brain. Skull: Negative Sinuses: Clear/normal.  Previous maxillary sinus reconstruction. Orbits: Negative Review of the MIP images confirms the above findings CTA NECK FINDINGS Aortic arch: Atherosclerosis of the aortic arch. No aneurysm or dissection. Branching pattern of the brachiocephalic vessels from the arch is normal. Right carotid system: Common carotid artery is widely patent to the bifurcation region.  There is calcified plaque at the carotid bifurcation but no stenosis. Cervical ICA is widely patent to the skull base. Left carotid system: Common carotid artery is tortuous but widely patent to the bifurcation. At the carotid bifurcation, there is calcified plaque but no stenosis or irregularity. Cervical ICA is tortuous but patent beyond that. Vertebral arteries: Right vertebral artery is dominant. Both vertebral artery origins are widely patent. Both vertebral arteries are widely patent through the cervical region to the foramen magnum. Skeleton: Ordinary mid cervical spondylosis. Other neck: There is a calcified thyroid mass on the left with multiple enlarged and partially calcified lymph nodes in the left neck levels 2, 3 and 4. There is probably also some involvement in the superior mediastinum. Pattern is quite likely to represent metastatic thyroid carcinoma. Upper chest: Upper lungs are clear. Review of the MIP images confirms the above findings CTA HEAD FINDINGS Anterior circulation: Both internal carotid arteries are patent through the skull base and siphon regions. There is mild atherosclerotic calcification in the carotid siphon regions  but no stenosis. The anterior and middle cerebral vessels are patent without proximal stenosis, aneurysm or vascular malformation. More distal branch vessels show atherosclerotic irregularity. Posterior circulation: Both vertebral arteries are patent at the foramen magnum level. The vertebrobasilar vessels are quite tortuous and arm affected by atherosclerotic disease. The V4 segment on the right shows atherosclerotic change in narrowing of 30-40%. The V4 segment on the left shows atherosclerotic irregularity but no focal stenosis. The basilar artery shows atherosclerotic plaque but no stenosis. The superior cerebellar and posterior cerebral arteries are patent proximally. More distal PCA branches show marked stenosis and irregularity on each side, similar to the MR angiogram of earlier this year. Venous sinuses: Patent and normal. Anatomic variants: None significant Delayed phase: No abnormal enhancement Review of the MIP images confirms the above findings IMPRESSION: Head CT does not show any acute infarction. Old ischemic changes seen in the left PCA territory. CT angiography of the neck vessels shows atherosclerosis but no stenosis at either carotid bifurcation. No significant posterior circulation finding in the neck. Intracranial CT angiography shows intracranial atherosclerotic change, most pronounced affecting the posterior cerebral artery branches but also present within the anterior and middle cerebral artery branches. The appearance is similar to an MR angiogram done earlier this year. There is tortuosity and atherosclerotic change of the distal vertebral arteries in the proximal basilar artery. Maximal stenosis of the distal right vertebral is 30-40%. Calcified left thyroid mass with multiple enlarged lymph nodes in the left side of the neck with calcification, the pattern likely to indicate metastatic thyroid carcinoma. Electronically Signed   By: [name] M.D.   On: 02/14/2018 14:33   Ct Angio  Neck W Or Wo Contrast  Result Date: 02/14/2018 CLINICAL DATA:  Blurred vision and tingling in both arms upon wakening this morning. EXAM: CT ANGIOGRAPHY HEAD AND NECK TECHNIQUE: Multidetector CT imaging of the head and neck was performed using the standard protocol during bolus administration of intravenous contrast. Multiplanar CT image reconstructions and MIPs were obtained to evaluate the vascular anatomy. Carotid stenosis measurements (when applicable) are obtained utilizing NASCET criteria, using the distal internal carotid diameter as the denominator. CONTRAST:  7mL ISOVUE-370 IOPAMIDOL (ISOVUE-370) INJECTION 76% COMPARISON:  CT 09/07/2017.  MRI 09/06/2017. FINDINGS: CT HEAD FINDINGS Brain: Brainstem and cerebellum appear within normal limits. There is old infarction in the left PCA territory affecting the left occipital lobe and left thalamus. No sign of acute infarction, mass lesion, hemorrhage,  hydrocephalus or extra-axial collection. Vascular: There is atherosclerotic calcification of the major vessels at the base of the brain. Skull: Negative Sinuses: Clear/normal.  Previous maxillary sinus reconstruction. Orbits: Negative Review of the MIP images confirms the above findings CTA NECK FINDINGS Aortic arch: Atherosclerosis of the aortic arch. No aneurysm or dissection. Branching pattern of the brachiocephalic vessels from the arch is normal. Right carotid system: Common carotid artery is widely patent to the bifurcation region. There is calcified plaque at the carotid bifurcation but no stenosis. Cervical ICA is widely patent to the skull base. Left carotid system: Common carotid artery is tortuous but widely patent to the bifurcation. At the carotid bifurcation, there is calcified plaque but no stenosis or irregularity. Cervical ICA is tortuous but patent beyond that. Vertebral arteries: Right vertebral artery is dominant. Both vertebral artery origins are widely patent. Both vertebral arteries are  widely patent through the cervical region to the foramen magnum. Skeleton: Ordinary mid cervical spondylosis. Other neck: There is a calcified thyroid mass on the left with multiple enlarged and partially calcified lymph nodes in the left neck levels 2, 3 and 4. There is probably also some involvement in the superior mediastinum. Pattern is quite likely to represent metastatic thyroid carcinoma. Upper chest: Upper lungs are clear. Review of the MIP images confirms the above findings CTA HEAD FINDINGS Anterior circulation: Both internal carotid arteries are patent through the skull base and siphon regions. There is mild atherosclerotic calcification in the carotid siphon regions but no stenosis. The anterior and middle cerebral vessels are patent without proximal stenosis, aneurysm or vascular malformation. More distal branch vessels show atherosclerotic irregularity. Posterior circulation: Both vertebral arteries are patent at the foramen magnum level. The vertebrobasilar vessels are quite tortuous and arm affected by atherosclerotic disease. The V4 segment on the right shows atherosclerotic change in narrowing of 30-40%. The V4 segment on the left shows atherosclerotic irregularity but no focal stenosis. The basilar artery shows atherosclerotic plaque but no stenosis. The superior cerebellar and posterior cerebral arteries are patent proximally. More distal PCA branches show marked stenosis and irregularity on each side, similar to the MR angiogram of earlier this year. Venous sinuses: Patent and normal. Anatomic variants: None significant Delayed phase: No abnormal enhancement Review of the MIP images confirms the above findings IMPRESSION: Head CT does not show any acute infarction. Old ischemic changes seen in the left PCA territory. CT angiography of the neck vessels shows atherosclerosis but no stenosis at either carotid bifurcation. No significant posterior circulation finding in the neck. Intracranial CT  angiography shows intracranial atherosclerotic change, most pronounced affecting the posterior cerebral artery branches but also present within the anterior and middle cerebral artery branches. The appearance is similar to an MR angiogram done earlier this year. There is tortuosity and atherosclerotic change of the distal vertebral arteries in the proximal basilar artery. Maximal stenosis of the distal right vertebral is 30-40%. Calcified left thyroid mass with multiple enlarged lymph nodes in the left side of the neck with calcification, the pattern likely to indicate metastatic thyroid carcinoma. Electronically Signed   By: [name] M.D.   On: 02/14/2018 14:33    EKG: Independently reviewed.  Sinus rhythm at 65 bpm  Assessment/Plan TIA: Acute. Patient presents with complaints of visual changes with numbness/tingling sensation of the right side of her body similar to previous CVA.  CT angiogram of the brain showed no acute abnormalities.  Neurology evaluated the patient and did not recommend any further imaging studies at  this time. It was recommended to optimize medical management.  - Admit to telemetry bed - Stroke order set initiated - Neuro checks - Check echocardiogram  - PT/OT/speech to eval and treat - Check Hemoglobin A1c and lipid panel in a.m. - Continue ASA and start Plavix per neurology recommendations - Atorvastatin - Appreciate neurology consultative services, will follow-up for further recommendations  Diabetes mellitus type 2: Patient reports being diet controlled, but on admission blood glucose 197 with last hemoglobin A1c noted to be 8.7 in 08/2017.  Patient currently not on any oral medication or insulin. - Hypoglycemic protocols  - CBGs q. before meals and at bedtime with sensitive SSI  History of CVA: Patient report no significant residual deficits.  Essential hypertension  - Continue amlodipine  Hyperlipidemia - Atorvastatin 80 mg daily  Polymyalgia  rheumatica - Continue prednisone  Marijuana use - Counseled on need to cessation of marijuana use   DVT prophylaxis: lovenox Code Status: Full Family Communication: No family present at bedside Disposition Plan: Likely discharge home once work-up completed Consults called: Neurology Admission status: Observation  Norval Morton MD [facility] Pager [phone]   If 7PM-7AM, please contact night-coverage [email or web address] Password [email or web address]  02/14/2018, 3:18 PM

## 2018-02-14 NOTE — ED Notes (Signed)
Report called to [place], RN on 3W - ready to accept pt

## 2018-02-14 NOTE — Progress Notes (Signed)
PHARMACIST - PHYSICIAN ORDER COMMUNICATION  CONCERNING: P&T Medication Policy on Herbal Medications  DESCRIPTION:  This patient's order for:  Acetylcarn Alpha Lipoic Acid has been noted.  This product(s) is classified as an "herbal" or natural product. Due to a lack of definitive safety studies or FDA approval, nonstandard manufacturing practices, plus the potential risk of unknown drug-drug interactions while on inpatient medications, the Pharmacy and Therapeutics Committee does not permit the use of "herbal" or natural products of this type within [facility].   ACTION TAKEN: The pharmacy department is unable to verify this order at this time. Please reevaluate patient's clinical condition at discharge and address if the herbal or natural product(s) should be resumed at that time.

## 2018-02-14 NOTE — Consult Note (Addendum)
Requesting Physician: Dr. Morton Amy    Chief Complaint: CVA/TIA  History obtained from:  Patient     HPI:                                                                                                                                         Misty Morris is an 67 y.o. 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 States she awoke at 0800 hours and noted her left visual field was blurry and right arm and leg had decreased sensation much like her previous stroke. She takes ASA 325 mg daily and cannot take Plavix due to stomach issues. Currently her vision has returned but right arm and leg still have decreased sensation. CT head is pending.   Date last known well: Date: 02/13/2018 Time last known well: Time: 23:00 tPA Given: No: minimal symptoms and resolving Modified Rankin: Rankin Score=0 NIHSS 1  Past Medical History:  Diagnosis Date  . Diabetes mellitus without complication (HCC)    diet controlled  . Fibromyalgia   . GERD (gastroesophageal reflux disease)   . Hypercholesterolemia   . Hypertension   . Polyarthritis rheumatica (HCC)    RA  . Shingles   . Stroke ([place])   . Vision impairment    right eye    Past Surgical History:  Procedure Laterality Date  . ABDOMINAL HYSTERECTOMY    . [place] 1  1980's  . PARTIAL KNEE ARTHROPLASTY Left 01/17/2017   Procedure: UNICOMPARTMENTAL LEFT KNEE;  Surgeon: Paralee Cancel, MD;  Location: [facility];  Service: Orthopedics;  Laterality: Left;  . RADIOLOGY WITH ANESTHESIA N/A 08/11/2017   Procedure: MRI OF BRAIN WITH AND WITHOUT CONSTRAST, AND OF THE ORBIT WITH AND WITHOUT CONTRAST;  Surgeon: Radiologist, Medication, MD;  Location: [facility];  Service: Radiology;  Laterality: N/A;  . RADIOLOGY WITH ANESTHESIA N/A 09/06/2017   Procedure: MRI OF BRAIN;  Surgeon: Radiologist, Medication, MD;  Location: [facility];  Service: Radiology;  Laterality: N/A;  . RHINOPLASTY    . TOTAL KNEE ARTHROPLASTY  Right 09/30/2015   Procedure: TOTAL RIGHT KNEE ARTHROPLASTY;  Surgeon: Paralee Cancel, MD;  Location: [facility];  Service: Orthopedics;  Laterality: Right;    Family History  Problem Relation Age of Onset  . Hypertension Mother   . Seizures Mother   . Hypertension Father   . Hypertension Sister   . Pancreatic cancer Other    Social History:  reports that she quit smoking about 6 years ago. Her smoking use included cigarettes. She has never used smokeless tobacco. She reports that she has current or past drug history. Drug: Marijuana. She reports that she does not drink alcohol.  Allergies:  Allergies  Allergen Reactions  . Codeine Hives  . Demerol [Meperidine] Hives  . Latex Dermatitis and Other (See Comments)    Blistering   . Morphine And Related  Hives  . Sulfa Antibiotics Hives  . Tramadol Hives  . Vicodin [Hydrocodone-Acetaminophen] Itching    Elevated blood pressure. Tolerates low doses.    Medications:                                                                                                                           No current facility-administered medications for this encounter.    Current Outpatient Medications  Medication Sig Dispense Refill  . Acetylcarn-Alpha Lipoic Acid 400-200 MG CAPS Take 1 capsule by mouth 2 (two) times daily.    Marland Kitchen amLODipine (NORVASC) 5 MG tablet Take 5 mg by mouth every evening.   2  . aspirin 325 MG tablet Take 1 tablet (325 mg total) by mouth daily. 30 tablet 0  . Biotin 1000 MCG tablet Take 1,000 mcg by mouth every evening.    . Calcium Carb-Cholecalciferol (CALCIUM 1000 + D PO) Take 1 tablet by mouth every evening.    . Cholecalciferol (VITAMIN D) 2000 units CAPS Take 4,000 Units by mouth daily.    . Chromium Picolinate 200 MCG CAPS Take 200 mcg by mouth daily.    . Coenzyme Q10 (COQ-10) 100 MG CAPS Take 100 mg by mouth daily.    . diphenhydrAMINE (BENADRYL) 25 MG tablet Take 25 mg by mouth at bedtime.    Marland Kitchen Lysine 500 MG CAPS Take 500  mg by mouth every evening.    . Omega-3 Fatty Acids (FISH OIL) 1200 MG CAPS Take 1,200 mg 2 (two) times daily by mouth.    . prednisoLONE 5 MG TABS tablet Take 5 mg by mouth every evening. Take with 4 mg prednisone to add up to 9mg s daily    . predniSONE (DELTASONE) 1 MG tablet Take 4 mg by mouth every evening. Take with 5mg s, add up to 9mg s daily    . TIMOPTIC OCUDOSE 0.5 % SOLN Place 1 drop into both eyes daily. Preservative free only  0  . ZIOPTAN 0.0015 % SOLN Place 1 drop into both eyes at bedtime.  3     ROS:                                                                                                                                       History obtained from the patient  General ROS: negative for - chills, fatigue, fever, night sweats, weight gain or weight  loss Psychological ROS: negative for - , hallucinations, memory difficulties, mood swings or  Ophthalmic ROS: positive for - blurry vision,  ENT ROS: negative for - epistaxis, nasal discharge, oral lesions, sore throat, tinnitus or vertigo Respiratory ROS: negative for - cough,  shortness of breath or wheezing Cardiovascular ROS: negative for - chest pain, dyspnea on exertion,  Gastrointestinal ROS: negative for - abdominal pain, diarrhea,  nausea/vomiting or stool incontinence Genito-Urinary ROS: negative for - dysuria, hematuria, incontinence or urinary frequency/urgency Musculoskeletal ROS: negative for - joint swelling or muscular weakness Neurological ROS: as noted in HPI   General Examination:                                                                                                      Blood pressure (!) 170/90, pulse 67, temperature 98.5 F (36.9 C), temperature source Oral, resp. rate 14, SpO2 95 %.  HEENT-  Normocephalic, no lesions, without obvious abnormality.  Normal external eye and conjunctiva.   Extremities- Warm, dry and intact Musculoskeletal-no joint tenderness, deformity or swelling Skin-warm and  dry, no hyperpigmentation, vitiligo, or suspicious lesions  Neurological Examination Mental Status: Alert, oriented, thought content appropriate.  Speech fluent without evidence of aphasia.  Able to follow 3 step commands without difficulty. Cranial Nerves: II: ; Visual fields grossly normal,  III,IV, VI: ptosis not present, extra-ocular motions intact bilaterally, pupils equal, round, reactive to light and accommodation V,VII: smile symmetric, facial light touch sensation normal bilaterally VIII: hearing normal bilaterally IX,X: uvula rises symmetrically XI: bilateral shoulder shrug XII: midline tongue extension Motor: Right : Upper extremity   5/5    Left:     Upper extremity   5/5  Lower extremity   5/5     Lower extremity   5/5 Tone and bulk:normal tone throughout; no atrophy noted Sensory:decreased sensation on right arm and leg Deep Tendon Reflexes: 2+ and symmetric throughout UE, no KJ (Knee replacement) and 2+ ankle jerk Plantars: Right: downgoing   Left: downgoing Cerebellar: normal finger-to-nose,and normal heel-to-shin test Gait: not tested   Lab Results: Basic Metabolic Panel: Recent Labs  Lab 02/14/18 0938 02/14/18 0947  NA 139 140  K 3.6 3.6  CL 104 103  CO2 24  --   GLUCOSE 193* 197*  BUN 14 16  CREATININE 0.83 0.70  CALCIUM 9.4  --     CBC: Recent Labs  Lab 02/14/18 0938 02/14/18 0947  WBC 9.5  --   NEUTROABS 6.8  --   HGB 14.4 15.0  HCT 45.9 44.0  MCV 84.4  --   PLT 357  --     Lipid Panel: No results for input(s): CHOL, TRIG, HDL, CHOLHDL, VLDL, LDLCALC in the last 168 hours.  CBG: Recent Labs  Lab 02/14/18 1006  GLUCAP 170*    Imaging: No results found.  Assessment and plan discussed with with attending physician and they are in agreement.    Etta Quill PA-C [facility] [phone]  02/14/2018, 11:56 AM   Assessment: 67 y.o. female with symptoms of right sided paresthesia and blurred vision. I suspect  TIA and  would favor working it up as such. She has not been taking a statin since her previous stroke and I would start this.   She requires general anesthesia to get an MRI, and I do not think it would change management enough to justify this risk, so can hold off on this.   Stroke Risk Factors - diabetes mellitus, hyperlipidemia and hypertension  Recommend --HgbA1c, fasting lipid panel --PT consult, OT consult, Speech consult --Echocardiogram --80 mg of Atorvistatin --Prophylactic therapy-Antiplatelet med:ASA + plavix x 3 weeks, then monotherapy.  --Telemetry monitoring --Frequent neuro checks --NPO until passes stroke swallow screen --please page stroke NP  Or  PA  Or MD from 8am -4 pm  as this patient from this time will be  followed by the stroke.   You can look them up on [email or web address]  Password [email or web address]   Roland Rack, MD [facility] [phone]  If 7pm- 7am, please page neurology on call as listed in [place].

## 2018-02-15 ENCOUNTER — Observation Stay (HOSPITAL_BASED_OUTPATIENT_CLINIC_OR_DEPARTMENT_OTHER): Payer: PPO

## 2018-02-15 DIAGNOSIS — I5030 Unspecified diastolic (congestive) heart failure: Secondary | ICD-10-CM | POA: Diagnosis not present

## 2018-02-15 DIAGNOSIS — E1159 Type 2 diabetes mellitus with other circulatory complications: Secondary | ICD-10-CM | POA: Diagnosis not present

## 2018-02-15 DIAGNOSIS — G459 Transient cerebral ischemic attack, unspecified: Secondary | ICD-10-CM | POA: Diagnosis not present

## 2018-02-15 DIAGNOSIS — M353 Polymyalgia rheumatica: Secondary | ICD-10-CM | POA: Diagnosis not present

## 2018-02-15 DIAGNOSIS — I10 Essential (primary) hypertension: Secondary | ICD-10-CM | POA: Diagnosis not present

## 2018-02-15 LAB — BASIC METABOLIC PANEL
Anion gap: 11 (ref 5–15)
BUN: 12 mg/dL (ref 8–23)
CHLORIDE: 101 mmol/L (ref 98–111)
CO2: 28 mmol/L (ref 22–32)
CREATININE: 0.8 mg/dL (ref 0.44–1.00)
Calcium: 9.5 mg/dL (ref 8.9–10.3)
GFR calc non Af Amer: >60 mL/min (ref >60)
Glucose, Bld: 146 mg/dL — ABNORMAL HIGH (ref 70–99)
POTASSIUM: 3.4 mmol/L — AB (ref 3.5–5.1)
SODIUM: 140 mmol/L (ref 135–145)

## 2018-02-15 LAB — CBC
HCT: 48.6 % — ABNORMAL HIGH (ref 36.0–46.0)
Hemoglobin: 15.1 g/dL — ABNORMAL HIGH (ref 12.0–15.0)
MCH: 26.4 pg (ref 26.0–34.0)
MCHC: 31.1 g/dL (ref 30.0–36.0)
MCV: 85 fL (ref 78.0–100.0)
PLATELETS: 372 10*3/uL (ref 150–400)
RBC: 5.72 MIL/uL — AB (ref 3.87–5.11)
RDW: 15.5 % (ref 11.5–15.5)
WBC: 11.6 10*3/uL — ABNORMAL HIGH (ref 4.0–10.5)

## 2018-02-15 LAB — GLUCOSE, CAPILLARY: GLUCOSE-CAPILLARY: 144 mg/dL — AB (ref 70–99)

## 2018-02-15 LAB — LIPID PANEL
CHOL/HDL RATIO: 6.1 ratio
CHOLESTEROL: 291 mg/dL — AB (ref 0–200)
HDL: 48 mg/dL (ref >40)
LDL Cholesterol: 176 mg/dL — ABNORMAL HIGH (ref 0–99)
Triglycerides: 334 mg/dL — ABNORMAL HIGH (ref <150)
VLDL: 67 mg/dL — ABNORMAL HIGH (ref 0–40)

## 2018-02-15 LAB — ECHOCARDIOGRAM COMPLETE
HEIGHTINCHES: 62 in
Weight: 2342.17 oz

## 2018-02-15 LAB — HEMOGLOBIN A1C
Hgb A1c MFr Bld: 7.2 % — ABNORMAL HIGH (ref 4.8–5.6)
Mean Plasma Glucose: 160 mg/dL

## 2018-02-15 MED ORDER — POTASSIUM CHLORIDE CRYS ER 20 MEQ PO TBCR
40.0000 meq | EXTENDED_RELEASE_TABLET | Freq: Once | ORAL | Status: AC
  Administered 2018-02-15: 40 meq via ORAL
  Filled 2018-02-15: qty 2

## 2018-02-15 MED ORDER — CLOPIDOGREL BISULFATE 75 MG PO TABS: 75.0000 mg | ORAL_TABLET | Freq: Every day | ORAL | 3 refills | Status: AC

## 2018-02-15 MED ORDER — ROSUVASTATIN CALCIUM 20 MG PO TABS: 20.0000 mg | ORAL_TABLET | Freq: Every day | ORAL | Status: DC

## 2018-02-15 MED ORDER — ROSUVASTATIN CALCIUM 20 MG PO TABS: 20.0000 mg | ORAL_TABLET | Freq: Every day | ORAL | 3 refills | Status: AC

## 2018-02-15 MED ORDER — ASPIRIN EC 81 MG PO TBEC: 81.0000 mg | DELAYED_RELEASE_TABLET | Freq: Every day | ORAL | 2 refills | Status: AC

## 2018-02-15 NOTE — Progress Notes (Addendum)
OT Cancellation Note and Discharge  Patient Details Name: Misty Morris MRN: [id number] DOB: 04-06-1951   Cancelled Treatment:    Reason Eval/Treat Not Completed: OT screened, no needs identified, will sign off   Did speak with Pt about visual deficits. Pt feels as though they have resolved. Pt reports still driving despite previous visual deficits.   [place] 02/15/2018, 2:35 PM  Hulda Humphrey OTR/L [phone]

## 2018-02-15 NOTE — Progress Notes (Signed)
OT Cancellation Note  Patient Details Name: Misty Morris MRN: [id number] DOB: April 02, 1951   Cancelled Treatment:    Reason Eval/Treat Not Completed: Patient at procedure or test/ unavailable ([place])  [place] 02/15/2018, 8:54 AM  Hulda Humphrey OTR/L [phone]

## 2018-02-15 NOTE — Progress Notes (Addendum)
STROKE TEAM PROGRESS NOTE  HPI ( Dr Leonel Ramsay ): Misty Morris is an 67 y.o. 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 States she awoke at 0800 hours and noted her left visual field was blurry and right arm and leg had decreased sensation much like her previous stroke. She takes ASA 325 mg daily and cannot take Plavix due to stomach issues. Currently her vision has returned but right arm and leg still have decreased sensation. CT head is pending.   Date last known well: Date: 02/13/2018 Time last known well: Time: 23:00 tPA Given: No: minimal symptoms and resolving Modified Rankin: Rankin Score=0 NIHSS 1   INTERVAL HISTORY No family is at the bedside.  She woke with spotty and dim vision. She went to fix her breakfast and did not "feel right" R side tingly. Sx resolved later in the night and have not recurred Folllowing her last stroke in jan 2019which waswhich was left PCA infarct felt to be related to diffuse intracranial atherosclerosis, she did not take the plavix because she thought it made her feel bad. She now thinks, maybe the stroke made her feel bad but not the plavix. She is willing to try how. She also did not take her statin as she has heard "many bad things" about them.  She had a friend on Crestor, so she is willing to try that one.   Vitals:   02/15/18 0050 02/15/18 0100 02/15/18 0431 02/15/18 0817  BP: (!) 161/79  (!) 155/82 (!) 164/99  Pulse: 81  68 89  Resp: 16  18 17   Temp: 98.7 F (37.1 C)  98 F (36.7 C) 98.6 F (37 C)  TempSrc: Oral  Oral Oral  SpO2: 96%  97% 98%  Weight:  66.4 kg (146 lb 6.2 oz)    Height:  5\' 2"  (1.575 m)      CBC:  Recent Labs  Lab 02/14/18 0938 02/14/18 0947 02/15/18 0540  WBC 9.5  --  11.6*  NEUTROABS 6.8  --   --   HGB 14.4 15.0 15.1*  HCT 45.9 44.0 48.6*  MCV 84.4  --  85.0  PLT 357  --  676    Basic Metabolic Panel:  Recent Labs  Lab 02/14/18 0938  02/14/18 0947 02/15/18 0540  NA 139 140 140  K 3.6 3.6 3.4*  CL 104 103 101  CO2 24  --  28  GLUCOSE 193* 197* 146*  BUN 14 16 12   CREATININE 0.83 0.70 0.80  CALCIUM 9.4  --  9.5   Lipid Panel:     Component Value Date/Time   CHOL 291 (H) 02/15/2018 0540   TRIG 334 (H) 02/15/2018 0540   HDL 48 02/15/2018 0540   CHOLHDL 6.1 02/15/2018 0540   VLDL 67 (H) 02/15/2018 0540   LDLCALC 176 (H) 02/15/2018 0540   HgbA1c:  Lab Results  Component Value Date   HGBA1C 8.7 (H) 08/11/2017   Urine Drug Screen:     Component Value Date/Time   LABOPIA NONE DETECTED 02/14/2018 1324   COCAINSCRNUR NONE DETECTED 02/14/2018 1324   LABBENZ NONE DETECTED 02/14/2018 1324   AMPHETMU NONE DETECTED 02/14/2018 1324   THCU POSITIVE (A) 02/14/2018 1324   LABBARB (A) 02/14/2018 1324    Result not available. Reagent lot number recalled by manufacturer.    Alcohol Level No results found for: [facility]  IMAGING Ct Angio Head W Or Wo Contrast  Result Date: 02/14/2018 CLINICAL DATA:  Blurred vision and tingling in both arms upon wakening this morning. EXAM: CT ANGIOGRAPHY HEAD AND NECK TECHNIQUE: Multidetector CT imaging of the head and neck was performed using the standard protocol during bolus administration of intravenous contrast. Multiplanar CT image reconstructions and MIPs were obtained to evaluate the vascular anatomy. Carotid stenosis measurements (when applicable) are obtained utilizing NASCET criteria, using the distal internal carotid diameter as the denominator. CONTRAST:  11mL ISOVUE-370 IOPAMIDOL (ISOVUE-370) INJECTION 76% COMPARISON:  CT 09/07/2017.  MRI 09/06/2017. FINDINGS: CT HEAD FINDINGS Brain: Brainstem and cerebellum appear within normal limits. There is old infarction in the left PCA territory affecting the left occipital lobe and left thalamus. No sign of acute infarction, mass lesion, hemorrhage, hydrocephalus or extra-axial collection. Vascular: There is atherosclerotic calcification of the  major vessels at the base of the brain. Skull: Negative Sinuses: Clear/normal.  Previous maxillary sinus reconstruction. Orbits: Negative Review of the MIP images confirms the above findings CTA NECK FINDINGS Aortic arch: Atherosclerosis of the aortic arch. No aneurysm or dissection. Branching pattern of the brachiocephalic vessels from the arch is normal. Right carotid system: Common carotid artery is widely patent to the bifurcation region. There is calcified plaque at the carotid bifurcation but no stenosis. Cervical ICA is widely patent to the skull base. Left carotid system: Common carotid artery is tortuous but widely patent to the bifurcation. At the carotid bifurcation, there is calcified plaque but no stenosis or irregularity. Cervical ICA is tortuous but patent beyond that. Vertebral arteries: Right vertebral artery is dominant. Both vertebral artery origins are widely patent. Both vertebral arteries are widely patent through the cervical region to the foramen magnum. Skeleton: Ordinary mid cervical spondylosis. Other neck: There is a calcified thyroid mass on the left with multiple enlarged and partially calcified lymph nodes in the left neck levels 2, 3 and 4. There is probably also some involvement in the superior mediastinum. Pattern is quite likely to represent metastatic thyroid carcinoma. Upper chest: Upper lungs are clear. Review of the MIP images confirms the above findings CTA HEAD FINDINGS Anterior circulation: Both internal carotid arteries are patent through the skull base and siphon regions. There is mild atherosclerotic calcification in the carotid siphon regions but no stenosis. The anterior and middle cerebral vessels are patent without proximal stenosis, aneurysm or vascular malformation. More distal branch vessels show atherosclerotic irregularity. Posterior circulation: Both vertebral arteries are patent at the foramen magnum level. The vertebrobasilar vessels are quite tortuous and arm  affected by atherosclerotic disease. The V4 segment on the right shows atherosclerotic change in narrowing of 30-40%. The V4 segment on the left shows atherosclerotic irregularity but no focal stenosis. The basilar artery shows atherosclerotic plaque but no stenosis. The superior cerebellar and posterior cerebral arteries are patent proximally. More distal PCA branches show marked stenosis and irregularity on each side, similar to the MR angiogram of earlier this year. Venous sinuses: Patent and normal. Anatomic variants: None significant Delayed phase: No abnormal enhancement Review of the MIP images confirms the above findings IMPRESSION: Head CT does not show any acute infarction. Old ischemic changes seen in the left PCA territory. CT angiography of the neck vessels shows atherosclerosis but no stenosis at either carotid bifurcation. No significant posterior circulation finding in the neck. Intracranial CT angiography shows intracranial atherosclerotic change, most pronounced affecting the posterior cerebral artery branches but also present within the anterior and middle cerebral artery branches. The appearance is similar to an MR angiogram done earlier this year. There is tortuosity and  atherosclerotic change of the distal vertebral arteries in the proximal basilar artery. Maximal stenosis of the distal right vertebral is 30-40%. Calcified left thyroid mass with multiple enlarged lymph nodes in the left side of the neck with calcification, the pattern likely to indicate metastatic thyroid carcinoma. Electronically Signed   By: [name] M.D.   On: 02/14/2018 14:33   Ct Angio Neck W Or Wo Contrast  Result Date: 02/14/2018 CLINICAL DATA:  Blurred vision and tingling in both arms upon wakening this morning. EXAM: CT ANGIOGRAPHY HEAD AND NECK TECHNIQUE: Multidetector CT imaging of the head and neck was performed using the standard protocol during bolus administration of intravenous contrast. Multiplanar CT  image reconstructions and MIPs were obtained to evaluate the vascular anatomy. Carotid stenosis measurements (when applicable) are obtained utilizing NASCET criteria, using the distal internal carotid diameter as the denominator. CONTRAST:  102mL ISOVUE-370 IOPAMIDOL (ISOVUE-370) INJECTION 76% COMPARISON:  CT 09/07/2017.  MRI 09/06/2017. FINDINGS: CT HEAD FINDINGS Brain: Brainstem and cerebellum appear within normal limits. There is old infarction in the left PCA territory affecting the left occipital lobe and left thalamus. No sign of acute infarction, mass lesion, hemorrhage, hydrocephalus or extra-axial collection. Vascular: There is atherosclerotic calcification of the major vessels at the base of the brain. Skull: Negative Sinuses: Clear/normal.  Previous maxillary sinus reconstruction. Orbits: Negative Review of the MIP images confirms the above findings CTA NECK FINDINGS Aortic arch: Atherosclerosis of the aortic arch. No aneurysm or dissection. Branching pattern of the brachiocephalic vessels from the arch is normal. Right carotid system: Common carotid artery is widely patent to the bifurcation region. There is calcified plaque at the carotid bifurcation but no stenosis. Cervical ICA is widely patent to the skull base. Left carotid system: Common carotid artery is tortuous but widely patent to the bifurcation. At the carotid bifurcation, there is calcified plaque but no stenosis or irregularity. Cervical ICA is tortuous but patent beyond that. Vertebral arteries: Right vertebral artery is dominant. Both vertebral artery origins are widely patent. Both vertebral arteries are widely patent through the cervical region to the foramen magnum. Skeleton: Ordinary mid cervical spondylosis. Other neck: There is a calcified thyroid mass on the left with multiple enlarged and partially calcified lymph nodes in the left neck levels 2, 3 and 4. There is probably also some involvement in the superior mediastinum. Pattern  is quite likely to represent metastatic thyroid carcinoma. Upper chest: Upper lungs are clear. Review of the MIP images confirms the above findings CTA HEAD FINDINGS Anterior circulation: Both internal carotid arteries are patent through the skull base and siphon regions. There is mild atherosclerotic calcification in the carotid siphon regions but no stenosis. The anterior and middle cerebral vessels are patent without proximal stenosis, aneurysm or vascular malformation. More distal branch vessels show atherosclerotic irregularity. Posterior circulation: Both vertebral arteries are patent at the foramen magnum level. The vertebrobasilar vessels are quite tortuous and arm affected by atherosclerotic disease. The V4 segment on the right shows atherosclerotic change in narrowing of 30-40%. The V4 segment on the left shows atherosclerotic irregularity but no focal stenosis. The basilar artery shows atherosclerotic plaque but no stenosis. The superior cerebellar and posterior cerebral arteries are patent proximally. More distal PCA branches show marked stenosis and irregularity on each side, similar to the MR angiogram of earlier this year. Venous sinuses: Patent and normal. Anatomic variants: None significant Delayed phase: No abnormal enhancement Review of the MIP images confirms the above findings IMPRESSION: Head CT does not show  any acute infarction. Old ischemic changes seen in the left PCA territory. CT angiography of the neck vessels shows atherosclerosis but no stenosis at either carotid bifurcation. No significant posterior circulation finding in the neck. Intracranial CT angiography shows intracranial atherosclerotic change, most pronounced affecting the posterior cerebral artery branches but also present within the anterior and middle cerebral artery branches. The appearance is similar to an MR angiogram done earlier this year. There is tortuosity and atherosclerotic change of the distal vertebral arteries  in the proximal basilar artery. Maximal stenosis of the distal right vertebral is 30-40%. Calcified left thyroid mass with multiple enlarged lymph nodes in the left side of the neck with calcification, the pattern likely to indicate metastatic thyroid carcinoma. Electronically Signed   By: [name] M.D.   On: 02/14/2018 14:33   2D echocardiogram - Left ventricle: The cavity size was normal. Wall thickness was increased in a pattern of mild LVH. There was moderate focal basal hypertrophy of the septum. Systolic function was normal. The estimated ejection fraction was in the range of 55% to 60%. Wall motion was normal; there were no regional wall motion abnormalities. Doppler parameters are consistent with abnormal left ventricular relaxation (grade 1 diastolic dysfunction). - Mitral valve: Calcified annulus. - Atrial septum: No defect or patent foramen ovale was identified by color doppler. Impressions:  Normal LV systolic function with mild to moderate LVH and diastolic dysfunction.   PHYSICAL EXAM Pleasant middle-aged lady currently not in distress. . Afebrile. Head is nontraumatic. Neck is supple without bruit.    Cardiac exam no murmur or gallop. Lungs are clear to auscultation. Distal pulses are well felt. Neurological Exam ;  Awake  Alert oriented x 3. Normal speech and language.eye movements full without nystagmus.fundi were not visualized. Vision acuity stimulation the right eye but are to test as patient has baseline retinitis pigmentosa and has spotty vision.and fields also difficult to test due to spotty vision but no gross hemianopsia notedl. Hearing is normal. Palatal movements are normal. Face symmetric. Tongue midline. Normal strength, tone, reflexes and coordination. Normal sensation. Gait deferred.  ASSESSMENT/PLAN Misty Morris is a 67 y.o. female with history of stroke in Jan, HTN, HLD, DB, OD retinal pigmentosis, polyarthritis, shinges presenting with L blurry vision and  R arm and leg altered sensation. .   Likely TIA, stroke workup underway  Needs sedation for MRI and just had in Jan. Results of an MRI will not change treatment course  CTA head no acute infarct.  Old left PCA ischemic changes.  Atherosclerosis.  Vessel tortuosity.  Calcified left thyroid mass with multiple enlarged lymph nodes in the neck  with calcification pattern likely indicates metastatic thyroid carcinoma.  CTA  neck atherosclerosis.  MRI pending (required sedation during last hospitalization.  Likely will not perform as it will not add any value to current treatment plan)  2D Echo  EF 55-60%. No source of embolus   LDL 176  HgbA1c pending  Lovenox 40 mg sq daily for VTE prophylaxis  aspirin 325 mg daily prior to admission, now on aspirin 325 mg daily and clopidogrel 75 mg daily following plavix load. Given large vessel intracranial atherosclerosis, patient should be treated with aspirin 81 mg and clopidogrel 75 mg orally every day x 3 months for secondary stroke prevention. After 3 months, change to single agent alone. Long-term dual antiplatelets are contraindicated due to risk for intracerebral hemorrhage.  Therapy recommendations:  No PT  Disposition:  Return home  BE SURE  TO STAY HYDRATED  Keep follow up appt with [facility] NP Misty Morris in September. No need for earlier followup.  Okay for discharge from stroke standpoint  Hypertension  Stable . BP goal normotensive  Hyperlipidemia  Home meds:  Prescribed statin but pt did not take, fish oil on her list  Placed on lipitor 20 in hospital. She would really like to be on crestor  LDL 176, goal < 70  Changed Lipitor to crestor  Continue statin at discharge  Diabetes type II  HgbA1c pending, goal < 7.0  Glucoses elevated  Uncontrolled  Other Stroke Risk Factors  Advanced age  Former Cigarette smoker  THC use, UDS postive this admission  Hx stroke/TIA  08/2017 -left PCA infarct felt to be secondary to  large vessel disease started on dual antiplatelets and statin  Other Active Problems  Thyroid mass, ? Metastatic cancer. For OP ENT followup ASAP  PMR  Hx shingles  OD retinal pigmentation  Hospital day # 0  Burnetta Sabin, MSN, APRN, ANVP-BC, AGPCNP-BC Advanced Practice Stroke Nurse [place] for Schedule & Pager information 02/15/2018 10:55 AM  I have personally examined this patient, reviewed notes, independently viewed imaging studies, participated in medical decision making and plan of care.ROS completed by me personally and pertinent positives fully documented  I have made any additions or clarifications directly to the above note. Agree with note above. She presented with transient right-sided paresthesias and vision difficulties of unclear etiology possibly TIA. She has previously had a left PCA infarct with diffuse intracranial atherosclerosis and had been noncompliant with her treatment regimen. Have counseled the patient to be compliant with her medications and maintain aggressive risk factor modification. Recommend dual antiplatelet therapy for 3 months followed by single agent alone. No need to repeat an MRI scan has patient required general anesthesia in the last admission for the same. She also has thyroid abnormality on CT angiography neck which needs follow-up with ENT for evaluation possible malignancy. Discussed with patient and Dr. [place] Pink. Greater than 50% time during this 35 minute visit was spent on counseling and coordination of care about her TIA and prior stroke and need for medication compliance and aggressive risk factor modification.  Antony Contras, MD Medical Director [facility] Pager: [phone] 02/15/2018 2:12 PM  To contact Stroke Continuity provider, please refer to [email or web address] After hours, contact General Neurology

## 2018-02-15 NOTE — Progress Notes (Signed)
  Echocardiogram 2D Echocardiogram has been performed.  Misty Morris 02/15/2018, 9:43 AM

## 2018-02-15 NOTE — Evaluation (Signed)
Physical Therapy Evaluation Patient Details Name: Misty Morris MRN: [id number] DOB: [birth year] Today's Date: 02/15/2018   History of Present Illness  Misty Morris is an 67 y.o. female with history of HTN, hyperlipidemia, CVA (Jan 2019), DM, Retinal pigmentation in right eye with decreased vision. Presented with blurry left visual field was and right arm and leg decreased sensation. Currently her vision has returned but right arm and leg still have decreased sensation. CT revealed hyperdense hemorrhage over the posterior junction of the left parietal and occipital lobes.  Clinical Impression  Patient presents with mobility close to baseline.  Demonstrates low risk for falls on DGI.  Did review fall prevention information and stroke warning signs and tx.  Has baseline vision deficits and compensates evidently to continue to drive.  Feel no follow up PT at this time.  Will sign off.    Follow Up Recommendations No PT follow up    Equipment Recommendations  None recommended by PT    Recommendations for Other Services       Precautions / Restrictions Precautions Precautions: Fall Precaution Comments: history of visual loss R eye and L eye superior L quadrant      Mobility  Bed Mobility Overal bed mobility: Independent                Transfers Overall transfer level: Independent                  Ambulation/Gait Ambulation/Gait assistance: Independent Gait Distance (Feet): 200 Feet Assistive device: None Gait Pattern/deviations: Step-through pattern;Decreased stride length;Staggering right     General Gait Details: one LOB initially after balance testing staggering R with self recovery  Stairs Stairs: Yes Stairs assistance: Independent Stair Management: No rails;Alternating pattern Number of Stairs: 2    Wheelchair Mobility    Modified Rankin (Stroke Patients Only) Modified Rankin (Stroke Patients Only) Pre-Morbid Rankin Score: No significant  disability Modified Rankin: No significant disability     Balance Overall balance assessment: Needs assistance   Sitting balance-Leahy Scale: Normal       Standing balance-Leahy Scale: Normal                 High Level Balance Comments: feet together 30 sec, eyes closed 10 seconds, picking up item from floor independent Standardized Balance Assessment Standardized Balance Assessment : Dynamic Gait Index   Dynamic Gait Index Level Surface: Mild Impairment Change in Gait Speed: Normal Gait with Horizontal Head Turns: Normal Gait with Vertical Head Turns: Normal Gait and Pivot Turn: Normal Step Over Obstacle: Normal Step Around Obstacles: Normal Steps: Normal Total Score: 23       Pertinent Vitals/Pain Pain Assessment: No/denies pain    Home Living Family/patient expects to be discharged to:: Private residence Living Arrangements: Alone Available Help at Discharge: Friend(s);Available PRN/intermittently Type of Home: House Home Access: Stairs to enter   Entrance Stairs-Number of Steps: 1 Home Layout: One level Home Equipment: Walker - 2 wheels;Bedside commode;Cane - single point;Tub bench;Grab bars - tub/shower;Shower seat      Prior Function Level of Independence: Independent         Comments: drives locally only     Hand Dominance   Dominant Hand: Right    Extremity/Trunk Assessment   Upper Extremity Assessment Upper Extremity Assessment: Overall WFL for tasks assessed    Lower Extremity Assessment Lower Extremity Assessment: Overall WFL for tasks assessed       Communication   Communication: No difficulties  Cognition Arousal/Alertness: Awake/alert Behavior During  Therapy: WFL for tasks assessed/performed Overall Cognitive Status: Within Functional Limits for tasks assessed                                        General Comments General comments (skin integrity, edema, etc.): reviewed fall prevention and importance  of seeking assist if has symptoms of tingling, numbness, vision loss or weakness/imbalance.    Exercises     Assessment/Plan    PT Assessment Patent does not need any further PT services  PT Problem List         PT Treatment Interventions      PT Goals (Current goals can be found in the Care Plan section)  Acute Rehab PT Goals PT Goal Formulation: All assessment and education complete, DC therapy    Frequency     Barriers to discharge        Co-evaluation               AM-PAC PT "6 Clicks" Daily Activity  Outcome Measure Difficulty turning over in bed (including adjusting bedclothes, sheets and blankets)?: None Difficulty moving from lying on back to sitting on the side of the bed? : None Difficulty sitting down on and standing up from a chair with arms (e.g., wheelchair, bedside commode, etc,.)?: None Help needed moving to and from a bed to chair (including a wheelchair)?: None Help needed walking in hospital room?: None Help needed climbing 3-5 steps with a railing? : None 6 Click Score: 24    End of Session Equipment Utilized During Treatment: Gait belt Activity Tolerance: Patient tolerated treatment well Patient left: in bed   PT Visit Diagnosis: Other abnormalities of gait and mobility (R26.89)    Time: 0940-1008 PT Time Calculation (min) (ACUTE ONLY): 28 min   Charges:   PT Evaluation $PT Eval Low Complexity: 1 Low PT Treatments $Self Care/Home Management: 8-22   PT G CodesMagda Kiel, [place] 02/15/2018   Reginia Naas 02/15/2018, 10:20 AM

## 2018-02-15 NOTE — Care Management Note (Signed)
Case Management Note  Patient Details  Name: Misty Morris MRN: [id number] Date of Birth: Mar 16, 1951  Subjective/Objective:           Pt in with TIA. She is from home alone.      PCP:   [place] [facility]: [facility]   Action/Plan: No f/u per PT and no DME needs. Pt has transportation home.   Expected Discharge Date:  02/15/18               Expected Discharge Plan:  Home/Self Care  In-House Referral:     Discharge planning Services     Post Acute Care Choice:    Choice offered to:     DME Arranged:    DME Agency:     HH Arranged:    HH Agency:     Status of Service:  Completed, signed off  If discussed at [employer] of Stay Meetings, dates discussed:    Additional Comments:  Pollie Friar, RN 02/15/2018, 2:52 PM

## 2018-02-15 NOTE — Discharge Summary (Signed)
[facility]  Physician Discharge Summary   Patient ID: Misty Morris MRN: [id number] DOB/AGE: [birth year] 67 y.o.  Admit date: 02/14/2018 Discharge date: 02/15/2018  PCP: [facility]  DISCHARGE DIAGNOSES:  Transient ischemic attack Left thyroid mass  RECOMMENDATIONS FOR OUTPATIENT FOLLOW UP: 1. Patient told to call ENT office to schedule appointment within 1 week.   DISCHARGE CONDITION: fair  Diet recommendation: Before  Filed Weights   02/15/18 0100  Weight: 66.4 kg (146 lb 6.2 oz)    INITIAL HISTORY: 67 y.o. female with medical history significant of HTN, HLD, DM, retin pigmentation, CVA, PMR, and GERD; who presents with complaints of decreased vision upon waking up this morning around 7:30 AM.  At baseline she reports having decreased vision out of her right eye.  She describes patient mostly out of the left eye being more dark and blurry than usual.  Associated symptoms include complaints of right-sided numbness tingling sensation that felt similar to her previous stroke which was in January.  She reports taking full dose aspirin daily as prescribed, but had not been placed on any statin it seeems at the patient request.  Patient has made dietary changes including decreasing fats since her last stroke.  She admits to smoking marijuana, but denies any other illicit drug use.  ED Course: Patient presented as a code stroke and was evaluated by neurology.  Upon admission into the emergency department patient was seen to be afebrile, blood pressure 170/90, and all other vital signs maintained.  Labs revealed glucose of 193, but otherwise are unremarkable.  UDS positive for marijuana.  CT angiogram of the head and neck did not show any acute abnormalities.  [facility] called to admit to complete stroke work-up.  No intervention was recommended as patient reported improvement of symptoms starting at around 11:30  AM.  Consultations:  Neurology  Procedures:  Transthoracic echocardiogram Study Conclusions  - Left ventricle: The cavity size was normal. Wall thickness was   increased in a pattern of mild LVH. There was moderate focal   basal hypertrophy of the septum. Systolic function was normal.   The estimated ejection fraction was in the range of 55% to 60%.   Wall motion was normal; there were no regional wall motion   abnormalities. Doppler parameters are consistent with abnormal   left ventricular relaxation (grade 1 diastolic dysfunction). - Mitral valve: Calcified annulus. - Atrial septum: No defect or patent foramen ovale was identified   by color doppler.  Impressions:  - Normal LV systolic function with mild to moderate LVH and   diastolic dysfunction.   HOSPITAL COURSE:   TIA Patient presented with complaints of visual changes with numbness/tingling sensation of the right side of her body similar to previous CVA.  CT angiogram of the head and neck showed no acute abnormalities.  Neurology evaluated the patient and did not recommend any further imaging studies at this time. It was recommended to optimize medical management.  She apparently had not been compliant with her statin medication.  She was taking aspirin.  Discussed with neurology today.  They recommend aspirin and Plavix for 3 months followed by Plavix alone.  Statin has been prescribed.  Seen by physical therapy.  Does not need any follow-up.  Left thyroid mass Incidentally noted on CT scan.  Calcifications noted.  Enlarged lymph nodes are seen.  Findings raise concern for possible cancer.  Discussed with the patient.  Also discussed with the on-call ENT Dr. Benjamine Mola.  He recommends outpatient follow-up  with him in his office in the next few days.  Patient will need biopsy.  Patient knows that she has to call his office for appointment.  Diabetes mellitus type 2 Patient reports being diet controlled, but on admission  blood glucose 197 with last hemoglobin A1c noted to be 8.7 in 08/2017. Patient currently not on any oral medication or insulin.  Needs outpatient follow-up with PCP  Essential hypertension  Continue home medications  Hyperlipidemia Patient started on Crestor.  LDL 176.  Polymyalgia rheumatica Continue prednisone  Marijuana use Counseled on need to cessation of marijuana use  Overall stable.  Okay for discharge home today.   PERTINENT LABS:  The results of significant diagnostics from this hospitalization (including imaging, microbiology, ancillary and laboratory) are listed below for reference.      Labs: Basic Metabolic Panel: Recent Labs  Lab 02/14/18 0938 02/14/18 0947 02/15/18 0540  NA 139 140 140  K 3.6 3.6 3.4*  CL 104 103 101  CO2 24  --  28  GLUCOSE 193* 197* 146*  BUN 14 16 12   CREATININE 0.83 0.70 0.80  CALCIUM 9.4  --  9.5   Liver Function Tests: Recent Labs  Lab 02/14/18 0938  AST 16  ALT 13  ALKPHOS 84  BILITOT 0.7  PROT 6.7  ALBUMIN 3.9   CBC: Recent Labs  Lab 02/14/18 0938 02/14/18 0947 02/15/18 0540  WBC 9.5  --  11.6*  NEUTROABS 6.8  --   --   HGB 14.4 15.0 15.1*  HCT 45.9 44.0 48.6*  MCV 84.4  --  85.0  PLT 357  --  372   CBG: Recent Labs  Lab 02/14/18 1006 02/14/18 1655 02/14/18 2120 02/15/18 0624  GLUCAP 170* 120* 228* 144*     IMAGING STUDIES Ct Angio Head W Or Wo Contrast  Result Date: 02/14/2018 CLINICAL DATA:  Blurred vision and tingling in both arms upon wakening this morning. EXAM: CT ANGIOGRAPHY HEAD AND NECK TECHNIQUE: Multidetector CT imaging of the head and neck was performed using the standard protocol during bolus administration of intravenous contrast. Multiplanar CT image reconstructions and MIPs were obtained to evaluate the vascular anatomy. Carotid stenosis measurements (when applicable) are obtained utilizing NASCET criteria, using the distal internal carotid diameter as the denominator. CONTRAST:   8mL ISOVUE-370 IOPAMIDOL (ISOVUE-370) INJECTION 76% COMPARISON:  CT 09/07/2017.  MRI 09/06/2017. FINDINGS: CT HEAD FINDINGS Brain: Brainstem and cerebellum appear within normal limits. There is old infarction in the left PCA territory affecting the left occipital lobe and left thalamus. No sign of acute infarction, mass lesion, hemorrhage, hydrocephalus or extra-axial collection. Vascular: There is atherosclerotic calcification of the major vessels at the base of the brain. Skull: Negative Sinuses: Clear/normal.  Previous maxillary sinus reconstruction. Orbits: Negative Review of the MIP images confirms the above findings CTA NECK FINDINGS Aortic arch: Atherosclerosis of the aortic arch. No aneurysm or dissection. Branching pattern of the brachiocephalic vessels from the arch is normal. Right carotid system: Common carotid artery is widely patent to the bifurcation region. There is calcified plaque at the carotid bifurcation but no stenosis. Cervical ICA is widely patent to the skull base. Left carotid system: Common carotid artery is tortuous but widely patent to the bifurcation. At the carotid bifurcation, there is calcified plaque but no stenosis or irregularity. Cervical ICA is tortuous but patent beyond that. Vertebral arteries: Right vertebral artery is dominant. Both vertebral artery origins are widely patent. Both vertebral arteries are widely patent through the cervical region to  the foramen magnum. Skeleton: Ordinary mid cervical spondylosis. Other neck: There is a calcified thyroid mass on the left with multiple enlarged and partially calcified lymph nodes in the left neck levels 2, 3 and 4. There is probably also some involvement in the superior mediastinum. Pattern is quite likely to represent metastatic thyroid carcinoma. Upper chest: Upper lungs are clear. Review of the MIP images confirms the above findings CTA HEAD FINDINGS Anterior circulation: Both internal carotid arteries are patent through the  skull base and siphon regions. There is mild atherosclerotic calcification in the carotid siphon regions but no stenosis. The anterior and middle cerebral vessels are patent without proximal stenosis, aneurysm or vascular malformation. More distal branch vessels show atherosclerotic irregularity. Posterior circulation: Both vertebral arteries are patent at the foramen magnum level. The vertebrobasilar vessels are quite tortuous and arm affected by atherosclerotic disease. The V4 segment on the right shows atherosclerotic change in narrowing of 30-40%. The V4 segment on the left shows atherosclerotic irregularity but no focal stenosis. The basilar artery shows atherosclerotic plaque but no stenosis. The superior cerebellar and posterior cerebral arteries are patent proximally. More distal PCA branches show marked stenosis and irregularity on each side, similar to the MR angiogram of earlier this year. Venous sinuses: Patent and normal. Anatomic variants: None significant Delayed phase: No abnormal enhancement Review of the MIP images confirms the above findings IMPRESSION: Head CT does not show any acute infarction. Old ischemic changes seen in the left PCA territory. CT angiography of the neck vessels shows atherosclerosis but no stenosis at either carotid bifurcation. No significant posterior circulation finding in the neck. Intracranial CT angiography shows intracranial atherosclerotic change, most pronounced affecting the posterior cerebral artery branches but also present within the anterior and middle cerebral artery branches. The appearance is similar to an MR angiogram done earlier this year. There is tortuosity and atherosclerotic change of the distal vertebral arteries in the proximal basilar artery. Maximal stenosis of the distal right vertebral is 30-40%. Calcified left thyroid mass with multiple enlarged lymph nodes in the left side of the neck with calcification, the pattern likely to indicate  metastatic thyroid carcinoma. Electronically Signed   By: [name] M.D.   On: 02/14/2018 14:33   Ct Angio Neck W Or Wo Contrast  Result Date: 02/14/2018 CLINICAL DATA:  Blurred vision and tingling in both arms upon wakening this morning. EXAM: CT ANGIOGRAPHY HEAD AND NECK TECHNIQUE: Multidetector CT imaging of the head and neck was performed using the standard protocol during bolus administration of intravenous contrast. Multiplanar CT image reconstructions and MIPs were obtained to evaluate the vascular anatomy. Carotid stenosis measurements (when applicable) are obtained utilizing NASCET criteria, using the distal internal carotid diameter as the denominator. CONTRAST:  73mL ISOVUE-370 IOPAMIDOL (ISOVUE-370) INJECTION 76% COMPARISON:  CT 09/07/2017.  MRI 09/06/2017. FINDINGS: CT HEAD FINDINGS Brain: Brainstem and cerebellum appear within normal limits. There is old infarction in the left PCA territory affecting the left occipital lobe and left thalamus. No sign of acute infarction, mass lesion, hemorrhage, hydrocephalus or extra-axial collection. Vascular: There is atherosclerotic calcification of the major vessels at the base of the brain. Skull: Negative Sinuses: Clear/normal.  Previous maxillary sinus reconstruction. Orbits: Negative Review of the MIP images confirms the above findings CTA NECK FINDINGS Aortic arch: Atherosclerosis of the aortic arch. No aneurysm or dissection. Branching pattern of the brachiocephalic vessels from the arch is normal. Right carotid system: Common carotid artery is widely patent to the bifurcation region. There is calcified  plaque at the carotid bifurcation but no stenosis. Cervical ICA is widely patent to the skull base. Left carotid system: Common carotid artery is tortuous but widely patent to the bifurcation. At the carotid bifurcation, there is calcified plaque but no stenosis or irregularity. Cervical ICA is tortuous but patent beyond that. Vertebral arteries: Right  vertebral artery is dominant. Both vertebral artery origins are widely patent. Both vertebral arteries are widely patent through the cervical region to the foramen magnum. Skeleton: Ordinary mid cervical spondylosis. Other neck: There is a calcified thyroid mass on the left with multiple enlarged and partially calcified lymph nodes in the left neck levels 2, 3 and 4. There is probably also some involvement in the superior mediastinum. Pattern is quite likely to represent metastatic thyroid carcinoma. Upper chest: Upper lungs are clear. Review of the MIP images confirms the above findings CTA HEAD FINDINGS Anterior circulation: Both internal carotid arteries are patent through the skull base and siphon regions. There is mild atherosclerotic calcification in the carotid siphon regions but no stenosis. The anterior and middle cerebral vessels are patent without proximal stenosis, aneurysm or vascular malformation. More distal branch vessels show atherosclerotic irregularity. Posterior circulation: Both vertebral arteries are patent at the foramen magnum level. The vertebrobasilar vessels are quite tortuous and arm affected by atherosclerotic disease. The V4 segment on the right shows atherosclerotic change in narrowing of 30-40%. The V4 segment on the left shows atherosclerotic irregularity but no focal stenosis. The basilar artery shows atherosclerotic plaque but no stenosis. The superior cerebellar and posterior cerebral arteries are patent proximally. More distal PCA branches show marked stenosis and irregularity on each side, similar to the MR angiogram of earlier this year. Venous sinuses: Patent and normal. Anatomic variants: None significant Delayed phase: No abnormal enhancement Review of the MIP images confirms the above findings IMPRESSION: Head CT does not show any acute infarction. Old ischemic changes seen in the left PCA territory. CT angiography of the neck vessels shows atherosclerosis but no stenosis  at either carotid bifurcation. No significant posterior circulation finding in the neck. Intracranial CT angiography shows intracranial atherosclerotic change, most pronounced affecting the posterior cerebral artery branches but also present within the anterior and middle cerebral artery branches. The appearance is similar to an MR angiogram done earlier this year. There is tortuosity and atherosclerotic change of the distal vertebral arteries in the proximal basilar artery. Maximal stenosis of the distal right vertebral is 30-40%. Calcified left thyroid mass with multiple enlarged lymph nodes in the left side of the neck with calcification, the pattern likely to indicate metastatic thyroid carcinoma. Electronically Signed   By: [name] M.D.   On: 02/14/2018 14:33    DISCHARGE EXAMINATION: Vitals:   02/15/18 0100 02/15/18 0431 02/15/18 0817 02/15/18 1213  BP:  (!) 155/82 (!) 164/99 134/78  Pulse:  68 89 75  Resp:  18 17 17   Temp:  98 F (36.7 C) 98.6 F (37 C) 98.9 F (37.2 C)  TempSrc:  Oral Oral Oral  SpO2:  97% 98% 98%  Weight: 66.4 kg (146 lb 6.2 oz)     Height: 5\' 2"  (1.575 m)      General appearance: alert, cooperative, appears stated age and no distress Cardio: regular rate and rhythm, S1, S2 normal, no murmur, click, rub or gallop GI: soft, non-tender; bowel sounds normal; no masses,  no organomegaly Extremities: extremities normal, atraumatic, no cyanosis or edema  DISPOSITION: Home  Discharge Instructions    Call MD for:  difficulty breathing, headache or visual disturbances   Complete by:  As directed    Call MD for:  extreme fatigue   Complete by:  As directed    Call MD for:  persistant dizziness or light-headedness   Complete by:  As directed    Call MD for:  persistant nausea and vomiting   Complete by:  As directed    Call MD for:  severe uncontrolled pain   Complete by:  As directed    Call MD for:  temperature >100.4   Complete by:  As directed    Diet -  low sodium heart healthy   Complete by:  As directed    Discharge instructions   Complete by:  As directed    Take your medications as prescribed.  Take aspirin only for 3 months.  Plavix to be taken indefinitely.  Please be sure to follow-up with the ENT doctor (Dr. Benjamine Mola) whose contact information is on your discharge paperwork for further evaluation of the thyroid mass.  You need to have this evaluated as cancer needs to be ruled out.  You were cared for by a hospitalist during your hospital stay. If you have any questions about your discharge medications or the care you received while you were in the hospital after you are discharged, you can call the unit and asked to speak with the hospitalist on call if the hospitalist that took care of you is not available. Once you are discharged, your primary care physician will handle any further medical issues. Please note that NO REFILLS for any discharge medications will be authorized once you are discharged, as it is imperative that you return to your primary care physician (or establish a relationship with a primary care physician if you do not have one) for your aftercare needs so that they can reassess your need for medications and monitor your lab values. If you do not have a primary care physician, you can call [phone] for a physician referral.   Increase activity slowly   Complete by:  As directed         Allergies as of 02/15/2018      Reactions   Codeine Hives   Demerol [meperidine] Hives   Latex Dermatitis, Other (See Comments)   Blistering   Morphine And Related Hives   Sulfa Antibiotics Hives   Tramadol Hives   Vicodin [hydrocodone-acetaminophen] Itching   Elevated blood pressure. Tolerates low doses.      Medication List    STOP taking these medications   aspirin 325 MG tablet Replaced by:  aspirin EC 81 MG tablet     TAKE these medications   Acetylcarn-Alpha Lipoic Acid 400-200 MG Caps Take 1 capsule by mouth 2 (two)  times daily.   amLODipine 5 MG tablet Commonly known as:  NORVASC Take 5 mg by mouth every evening.   aspirin EC 81 MG tablet Take 1 tablet (81 mg total) by mouth daily. For 3 months only. Replaces:  aspirin 325 MG tablet   CALCIUM 1000 + D PO Take 1 tablet by mouth every evening.   Chromium Picolinate 200 MCG Caps Take 200 mcg by mouth daily.   clopidogrel 75 MG tablet Commonly known as:  PLAVIX Take 1 tablet (75 mg total) by mouth daily. Start taking on:  02/16/2018   CoQ-10 100 MG Caps Take 100 mg by mouth daily.   diphenhydrAMINE 25 MG tablet Commonly known as:  BENADRYL Take 25 mg by mouth at bedtime.  Fish Oil 1200 MG Caps Take 1,200 mg 2 (two) times daily by mouth.   Lysine 500 MG Caps Take 500 mg by mouth every evening.   predniSONE 10 MG tablet Commonly known as:  DELTASONE Take 10 mg by mouth every evening.   rosuvastatin 20 MG tablet Commonly known as:  CRESTOR Take 1 tablet (20 mg total) by mouth daily at 6 PM.   TIMOPTIC OCUDOSE 0.5 % Soln Generic drug:  Timolol Maleate PF Place 1 drop into both eyes daily. Preservative free only   Vitamin D 2000 units Caps Take 4,000 Units by mouth daily.   ZIOPTAN 0.0015 % Soln Generic drug:  Tafluprost Place 1 drop into both eyes at bedtime.        Follow-up Information    Leta Baptist, MD. Schedule an appointment as soon as possible for a visit in 1 week(s).   Specialty:  Otolaryngology Why:  for further work of thyroid mass Contact information: [address] [address] [phone]        Venancio Poisson, NP Follow up on 04/21/2018.   Specialty:  Nurse Practitioner Why:  at 11:00 Contact information: [address] [place] [address] [phone]           TOTAL DISCHARGE TIME: 35 mins  Bonnielee Haff  [facility] Pager [phone]  02/15/2018, 3:06 PM

## 2018-02-15 NOTE — Evaluation (Signed)
Speech Language Pathology Evaluation Patient Details Name: Misty Morris MRN: [id number] DOB: 02-07-1951 Today's Date: 02/15/2018 Time: 5009-3818 SLP Time Calculation (min) (ACUTE ONLY): 11 min  Problem List:  Patient Active Problem List   Diagnosis Date Noted  . TIA (transient ischemic attack) 02/14/2018  . Hyperlipidemia   . Ischemic optic neuropathy of right eye   . Stroke ([place]) 09/04/2017  . HTN (hypertension) 09/04/2017  . Diabetes mellitus ([place]) 09/04/2017  . Polymyalgia rheumatica ([place]) 09/04/2017  . Fibromyalgia 09/04/2017  . S/P left UKR 01/17/2017  . Overweight (BMI 25.0-29.9) 10/01/2015  . S/P right TKA 09/30/2015   Past Medical History:  Past Medical History:  Diagnosis Date  . Fibromyalgia   . GERD (gastroesophageal reflux disease)   . Hypercholesterolemia   . Hypertension   . Polymyalgia rheumatica ([place])   . Rheumatoid arthritis ([place])    "atypical/MD 08/2017" (02/14/2018)  . Shingles   . Stroke ([place]) 08/2017   "no residual" (02/14/2018)  . TIA (transient ischemic attack) 02/14/2018  . Type 2 diabetes, diet controlled ([place])   . Vision impairment    right eye   Past Surgical History:  Past Surgical History:  Procedure Laterality Date  . CATARACT EXTRACTION W/ INTRAOCULAR LENS  IMPLANT, BILATERAL    . JOINT REPLACEMENT    . OPEN REDUCTION LE FORT I FRACTURE  1980s  . PARTIAL KNEE ARTHROPLASTY Left 01/17/2017   Procedure: UNICOMPARTMENTAL LEFT KNEE;  Surgeon: Paralee Cancel, MD;  Location: [facility];  Service: Orthopedics;  Laterality: Left;  . RADIOLOGY WITH ANESTHESIA N/A 08/11/2017   Procedure: MRI OF BRAIN WITH AND WITHOUT CONSTRAST, AND OF THE ORBIT WITH AND WITHOUT CONTRAST;  Surgeon: Radiologist, Medication, MD;  Location: [place] [facility];  Service: Radiology;  Laterality: N/A;  . RADIOLOGY WITH ANESTHESIA N/A 09/06/2017   Procedure: MRI OF BRAIN;  Surgeon: Radiologist, Medication, MD;  Location: [facility];  Service: Radiology;  Laterality: N/A;  . RHINOPLASTY    . TOTAL  KNEE ARTHROPLASTY Right 09/30/2015   Procedure: TOTAL RIGHT KNEE ARTHROPLASTY;  Surgeon: Paralee Cancel, MD;  Location: [facility];  Service: Orthopedics;  Laterality: Right;  Marland Kitchen VAGINAL HYSTERECTOMY     HPI:  67 y.o. female with history of HTN, hyperlipidemia, CVA (Jan 2019), DM, Retinal pigmentation in right eye with decreased vision. Presented with blurry left visual field was and right arm and leg decreased sensation. Currently her vision has returned but right arm and leg still have decreased sensation. CT revealed hyperdense hemorrhage over the posterior junction of the left parietal and occipital lobes   Assessment / Plan / Recommendation Clinical Impression  Pt presents with normal expressive/receptive language, fluent and clear speech, no cognitive changes.  No SLP needs identified.  Our service will sign off.     SLP Assessment  SLP Recommendation/Assessment: Patient does not need any further Speech Lanaguage Pathology Services SLP Visit Diagnosis: Cognitive communication deficit (R41.841)    Follow Up Recommendations  None    Frequency and Duration           SLP Evaluation Cognition  Overall Cognitive Status: Within Functional Limits for tasks assessed       Comprehension  Auditory Comprehension Overall Auditory Comprehension: Appears within functional limits for tasks assessed Yes/No Questions: Within Functional Limits Commands: Within Functional Limits Conversation: Complex Visual Recognition/Discrimination Discrimination: Within Function Limits Reading Comprehension Reading Status: Within funtional limits    Expression Expression Primary Mode of Expression: Verbal Verbal Expression Overall Verbal Expression: Appears within functional limits for tasks assessed Initiation:  No impairment Level of Generative/Spontaneous Verbalization: Conversation Repetition: No impairment Naming: No impairment Pragmatics: No impairment Written Expression Dominant Hand: Right   Oral  / Motor  Oral Motor/Sensory Function Overall Oral Motor/Sensory Function: Within functional limits Motor Speech Overall Motor Speech: Appears within functional limits for tasks assessed   GO                    Juan Quam Laurice 02/15/2018, 2:12 PM

## 2018-02-21 ENCOUNTER — Other Ambulatory Visit (INDEPENDENT_AMBULATORY_CARE_PROVIDER_SITE_OTHER): Payer: Self-pay | Admitting: Otolaryngology

## 2018-02-21 DIAGNOSIS — R221 Localized swelling, mass and lump, neck: Secondary | ICD-10-CM | POA: Diagnosis not present

## 2018-02-21 DIAGNOSIS — E079 Disorder of thyroid, unspecified: Secondary | ICD-10-CM

## 2018-02-21 DIAGNOSIS — D440 Neoplasm of uncertain behavior of thyroid gland: Secondary | ICD-10-CM | POA: Diagnosis not present

## 2018-02-23 ENCOUNTER — Ambulatory Visit (HOSPITAL_COMMUNITY)
Admission: RE | Admit: 2018-02-23 | Discharge: 2018-02-23 | Disposition: A | Discharge status: Home / Self Care | Payer: PPO | Source: Ambulatory Visit | Attending: Otolaryngology | Admitting: Otolaryngology

## 2018-02-23 DIAGNOSIS — R590 Localized enlarged lymph nodes: Secondary | ICD-10-CM | POA: Diagnosis not present

## 2018-02-23 DIAGNOSIS — E042 Nontoxic multinodular goiter: Secondary | ICD-10-CM | POA: Diagnosis not present

## 2018-02-23 DIAGNOSIS — E079 Disorder of thyroid, unspecified: Secondary | ICD-10-CM

## 2018-02-24 ENCOUNTER — Other Ambulatory Visit (INDEPENDENT_AMBULATORY_CARE_PROVIDER_SITE_OTHER): Payer: Self-pay | Admitting: Otolaryngology

## 2018-02-24 DIAGNOSIS — E041 Nontoxic single thyroid nodule: Secondary | ICD-10-CM

## 2018-03-06 ENCOUNTER — Telehealth: Payer: Self-pay | Admitting: Adult Health

## 2018-03-06 NOTE — Telephone Encounter (Signed)
Pt is calling to inform that she is unable to take clopidogrel (PLAVIX) 75 MG tablet.  Pt states she feels a tightening around her chest when she takes clopidogrel (PLAVIX) 75 MG tablet.  Pt is asking for a call re: what else she can take.  Please call

## 2018-03-06 NOTE — Telephone Encounter (Signed)
Please notify patient that if she feels as though this side effect is related to Plavix, she can try to stop to see if chest pain resolves. It chest pain continues, she needs to follow with PCP and restart plavix after 2 days.

## 2018-03-06 NOTE — Telephone Encounter (Signed)
Rn call patient about her not be able to take plavix. PT stop the plavix last month,and is only taking aspirin. Patient stated she has tighten around the chest, not really chest pain. She has this years ago while taking plavix. Pt had a cardiac work up and everything was negative. Rn stated per Janett Billow NP if she stops Plavix and does not want to take it, there is risk for another stroke. PT stated she will start back taking it,and give [place] a call if she has any problems. RN stated plavix 75mg  is the normal dosage for her. Pt verbalized understanding.

## 2018-03-16 DIAGNOSIS — H5340 Unspecified visual field defects: Secondary | ICD-10-CM | POA: Diagnosis not present

## 2018-03-16 DIAGNOSIS — H5310 Unspecified subjective visual disturbances: Secondary | ICD-10-CM | POA: Diagnosis not present

## 2018-03-20 DIAGNOSIS — H5310 Unspecified subjective visual disturbances: Secondary | ICD-10-CM | POA: Diagnosis not present

## 2018-03-27 DIAGNOSIS — M79644 Pain in right finger(s): Secondary | ICD-10-CM | POA: Diagnosis not present

## 2018-03-27 DIAGNOSIS — M353 Polymyalgia rheumatica: Secondary | ICD-10-CM | POA: Diagnosis not present

## 2018-03-27 DIAGNOSIS — M8580 Other specified disorders of bone density and structure, unspecified site: Secondary | ICD-10-CM | POA: Diagnosis not present

## 2018-03-27 DIAGNOSIS — M1990 Unspecified osteoarthritis, unspecified site: Secondary | ICD-10-CM | POA: Diagnosis not present

## 2018-03-27 DIAGNOSIS — Z79899 Other long term (current) drug therapy: Secondary | ICD-10-CM | POA: Diagnosis not present

## 2018-03-27 DIAGNOSIS — M25519 Pain in unspecified shoulder: Secondary | ICD-10-CM | POA: Diagnosis not present

## 2018-03-27 DIAGNOSIS — M0579 Rheumatoid arthritis with rheumatoid factor of multiple sites without organ or systems involvement: Secondary | ICD-10-CM | POA: Diagnosis not present

## 2018-03-27 DIAGNOSIS — M797 Fibromyalgia: Secondary | ICD-10-CM | POA: Diagnosis not present

## 2018-04-06 DIAGNOSIS — M0579 Rheumatoid arthritis with rheumatoid factor of multiple sites without organ or systems involvement: Secondary | ICD-10-CM | POA: Diagnosis not present

## 2018-04-20 DIAGNOSIS — M0579 Rheumatoid arthritis with rheumatoid factor of multiple sites without organ or systems involvement: Secondary | ICD-10-CM | POA: Diagnosis not present

## 2018-04-21 ENCOUNTER — Ambulatory Visit (INDEPENDENT_AMBULATORY_CARE_PROVIDER_SITE_OTHER): Payer: PPO | Admitting: Adult Health

## 2018-04-21 ENCOUNTER — Encounter: Payer: Self-pay | Admitting: Adult Health

## 2018-04-21 VITALS — BP 163/82 | HR 62 | Ht 62.0 in | Wt 144.0 lb

## 2018-04-21 DIAGNOSIS — E785 Hyperlipidemia, unspecified: Secondary | ICD-10-CM | POA: Diagnosis not present

## 2018-04-21 DIAGNOSIS — Z8673 Personal history of transient ischemic attack (TIA), and cerebral infarction without residual deficits: Secondary | ICD-10-CM

## 2018-04-21 DIAGNOSIS — I10 Essential (primary) hypertension: Secondary | ICD-10-CM

## 2018-04-21 DIAGNOSIS — G459 Transient cerebral ischemic attack, unspecified: Secondary | ICD-10-CM

## 2018-04-21 DIAGNOSIS — E119 Type 2 diabetes mellitus without complications: Secondary | ICD-10-CM | POA: Diagnosis not present

## 2018-04-21 NOTE — Progress Notes (Signed)
[name] [address] [place]. [place] [address] [phone]       OFFICE FOLLOW UP NOTE  [name] Date of Birth:  Jan 21, 1951 Medical Record Number:  [id number]   Reason for visit:  F/u visit with recent TIA  CHIEF COMPLAINT:  Chief Complaint  Patient presents with  . Follow-up    TIA follow up from hospital in July 2019 pt alone    HPI: BEDIE DOMINEY is being seen today for initial visit in the office for left PCA infarct on 09/04/17. History obtained from patient and chart review. Reviewed all radiology images and labs personally.  ALYNN ELLITHORPE is an 67 y.o. female with a past medical history of type 2 diabetes mellitus hypertension, dyslipidemia, fibromyalgia rheumatica who presented to the ER on 09/04/17 with right arm numbness.  Patient was in her usual state of health yesterday and went to bed around 1030pm and woke up around 4 AM this morning went to the bathroom, did not notice any abnormalities when she went to the bathroom.  Upon waking up the morning of admission, she noticed right arm and right leg numbness that seemed to get worse throughout the morning with some weakness in the right arm as well as unsteady gait.  She denies ever having such symptoms, personal history of stroke or family history of stroke.  CT head reviewed was negative for acute hemorrhage.  MRI reviewed and showed acute infarct in the left PCA territory and superimposed small acute subacute appearing infarct in the superior left occipital lobe.  MRA did show multifocal severe stenosis in the left PCA but negative for large vessel occlusion.  2D echo showed an EF of 65-70%.  LDL 143 and A1c 8.7.  It was recommended the patient be discharged on Lipitor 80 mg as well as he is aspirin and Plavix for 3 months and then continue Plavix alone.  Patient discharged home in stable condition.  10/19/2017 visit: Patient has been doing well.  She only has slight right shoulder numbness but  denies other numbness or weakness.  Patient states she was unable to tolerate her Plavix as she felt as though her chest was getting "squeezed"shortly after taking the medication.  Patient did continue to take aspirin 325 mg daily without increase in bleeding or bruising.  Did not take Lipitor as she believes they are "bad drug".  She believes is about all statin medications.  She does state that she has started to eat healthy and participate at the gym daily.  She is also been attending PT at the neuro rehabilitation center where she has 1 more visit and they have been mainly helping her for her balance.  She does have a history of retinitis pigmentosa or according to her ophthalmologist this is almost back to her baseline.  Blood pressure at today's appointment was mildly elevated at 161/91.  Patient states that she does check her blood pressure at home her systolics are usually 427C.  Her fasting sugars have been ranging from 90-107.  Denies new or worsening stroke/TIA symptoms at this time.  Interval history 04/21/2018: Patient returns today for follow-up visit.  Since prior visit, patient was seen in the ED on 02/15/2018 with left blurry vision and right arm and leg altered sensation.  CT head reviewed and was negative for acute infarct but did show calcified left thyroid mass with multiple enlarged lymph nodes in the neck with calcification pattern likely indicates metastatic thyroid carcinoma.  CTA of  the neck showed arthrosclerosis.  CTA of head showed intracranial arthrosclerotic change affecting posterior cerebral artery branches also anterior and middle cerebral artery branches.  MRI was not performed as she requires sedation and decided to not obtain as result will not change treatment plan.  2D echo showed an EF of 55 to 60% without cardiac source of embolus.  LDL 176 and as though it was recommended to start Lipitor patient requested to be on Crestor.  Patient was previously on aspirin 325 mg daily and  recommended DAPT with aspirin and Plavix for 3 months and then single agent alone.  Recommended follow-up with ENT as outpatient for evaluation of possible thyroid malignancy.  Patient was discharged home in stable condition.   Patient has been doing well overall without residual deficits or symptoms.  She states she has been compliant with all medications.  Patient did call into office on 03/06/2018 with concerns of Plavix causing chest pain.  She states she did stop the Plavix but continued to have chest pain.  Recommended to restart Plavix and to be followed with her primary care.  She states after restarting Plavix she did not experience increasing chest pain and this has since resolved.  She continues to take both aspirin 81 mg and Plavix with bruising but denies bleeding.  Continues to take Crestor without side effects of myalgias.  Patient will be having blood work on Wednesday where she will have a lipid panel and A1c checked along with other routine labs.  Blood pressure today elevated at 163/82 and his patient does monitor this at home she states it is typically lower but she is currently experiencing pain in her bilateral thumbs due to arthritis.  She did have follow-up thyroid ultrasound on 02/23/2018 and a follow-up biopsy was recommended to rule out possible carcinoma.  She was advised by ENT to follow-up in October/November as that will be 3 months post TIA event as she will need to stop Plavix prior to biopsy.  She has returned to all prior activities.  She is currently driving as she was released by her ophthalmologist as her vision has been improving.  She has been working out at [employer] daily without complication.  She states she understands importance of compliance with medication recommendations for secondary stroke prevention.  Denies new or worsening stroke/TIA symptoms.    ROS:   14 system review of systems performed and negative with exception of no complaints  PMH:  Past Medical  History:  Diagnosis Date  . Fibromyalgia   . GERD (gastroesophageal reflux disease)   . Hypercholesterolemia   . Hypertension   . Polymyalgia rheumatica ([place])   . Rheumatoid arthritis ([place])    "atypical/MD 08/2017" (02/14/2018)  . Shingles   . Stroke ([place]) 08/2017   "no residual" (02/14/2018)  . TIA (transient ischemic attack) 02/14/2018  . Type 2 diabetes, diet controlled ([place])   . Vision impairment    right eye    PSH:  Past Surgical History:  Procedure Laterality Date  . CATARACT EXTRACTION W/ INTRAOCULAR LENS  IMPLANT, BILATERAL    . JOINT REPLACEMENT    . OPEN REDUCTION LE FORT I FRACTURE  1980s  . PARTIAL KNEE ARTHROPLASTY Left 01/17/2017   Procedure: UNICOMPARTMENTAL LEFT KNEE;  Surgeon: Paralee Cancel, MD;  Location: [facility];  Service: Orthopedics;  Laterality: Left;  . RADIOLOGY WITH ANESTHESIA N/A 08/11/2017   Procedure: MRI OF BRAIN WITH AND WITHOUT CONSTRAST, AND OF THE ORBIT WITH AND WITHOUT CONTRAST;  Surgeon: Radiologist,  Medication, MD;  Location: [facility];  Service: Radiology;  Laterality: N/A;  . RADIOLOGY WITH ANESTHESIA N/A 09/06/2017   Procedure: MRI OF BRAIN;  Surgeon: Radiologist, Medication, MD;  Location: [facility];  Service: Radiology;  Laterality: N/A;  . RHINOPLASTY    . TOTAL KNEE ARTHROPLASTY Right 09/30/2015   Procedure: TOTAL RIGHT KNEE ARTHROPLASTY;  Surgeon: Paralee Cancel, MD;  Location: [facility];  Service: Orthopedics;  Laterality: Right;  Marland Kitchen VAGINAL HYSTERECTOMY      Social History:  Social History   Socioeconomic History  . Marital status: Divorced    Spouse name: Not on file  . Number of children: Not on file  . Years of education: Not on file  . Highest education level: Not on file  Occupational History  . Not on file  Social Needs  . Financial resource strain: Not on file  . Food insecurity:    Worry: Not on file    Inability: Not on file  . Transportation needs:    Medical: Not on file    Non-medical: Not on file  Tobacco Use  . Smoking  status: Former Smoker    Packs/day: 1.00    Years: 25.00    Pack years: 25.00    Types: Cigarettes    Last attempt to quit: 12/27/2011    Years since quitting: 6.3  . Smokeless tobacco: Never Used  Substance and Sexual Activity  . Alcohol use: Never    Frequency: Never  . Drug use: Yes    Types: Marijuana    Comment: 02/14/2018 "2-3 times/wk"  . Sexual activity: Not Currently  Lifestyle  . Physical activity:    Days per week: Not on file    Minutes per session: Not on file  . Stress: Not on file  Relationships  . Social connections:    Talks on phone: Not on file    Gets together: Not on file    Attends religious service: Not on file    Active member of club or organization: Not on file    Attends meetings of clubs or organizations: Not on file    Relationship status: Not on file  . Intimate partner violence:    Fear of current or ex partner: Not on file    Emotionally abused: Not on file    Physically abused: Not on file    Forced sexual activity: Not on file  Other Topics Concern  . Not on file  Social History Narrative  . Not on file    Family History:  Family History  Problem Relation Age of Onset  . Hypertension Mother   . Seizures Mother   . Hypertension Father   . Hypertension Sister   . Pancreatic cancer Other     Medications:   Current Outpatient Medications on File Prior to Visit  Medication Sig Dispense Refill  . Abatacept (ORENCIA IV) Inject into the vein every 30 (thirty) days.    Eldridge Abrahams Lipoic Acid 400-200 MG CAPS Take 1 capsule by mouth 2 (two) times daily.    Marland Kitchen amLODipine (NORVASC) 5 MG tablet Take 5 mg by mouth every evening.   2  . aspirin EC 81 MG tablet Take 1 tablet (81 mg total) by mouth daily. For 3 months only. 30 tablet 2  . Calcium Carb-Cholecalciferol (CALCIUM 1000 + D PO) Take 1 tablet by mouth every evening.    . Cholecalciferol (VITAMIN D) 2000 units CAPS Take 4,000 Units by mouth daily.    . Chromium Picolinate 200  MCG  CAPS Take 200 mcg by mouth daily.    . clopidogrel (PLAVIX) 75 MG tablet Take 1 tablet (75 mg total) by mouth daily. 30 tablet 3  . Coenzyme Q10 (COQ-10) 100 MG CAPS Take 100 mg by mouth daily.    . diphenhydrAMINE (BENADRYL) 25 MG tablet Take 25 mg by mouth at bedtime.    Marland Kitchen Lysine 500 MG CAPS Take 500 mg by mouth every evening.    . Omega-3 Fatty Acids (FISH OIL) 1200 MG CAPS Take 1,200 mg 2 (two) times daily by mouth.    . predniSONE (DELTASONE) 1 MG tablet TK 4 TS PO QD WITH A 5 MG T FOR A TOTAL DOSE OF 9 MG.  0  . predniSONE (DELTASONE) 10 MG tablet Take 10 mg by mouth every evening.     . rosuvastatin (CRESTOR) 20 MG tablet Take 1 tablet (20 mg total) by mouth daily at 6 PM. 30 tablet 3  . TIMOPTIC OCUDOSE 0.5 % SOLN Place 1 drop into both eyes daily. Preservative free only  0  . ZIOPTAN 0.0015 % SOLN Place 1 drop into both eyes at bedtime.  3   No current facility-administered medications on file prior to visit.     Allergies:   Allergies  Allergen Reactions  . Codeine Hives  . Demerol [Meperidine] Hives  . Latex Dermatitis and Other (See Comments)    Blistering   . Morphine And Related Hives  . Sulfa Antibiotics Hives  . Tramadol Hives  . Vicodin [Hydrocodone-Acetaminophen] Itching    Elevated blood pressure. Tolerates low doses.     Physical Exam  Vitals:   04/21/18 1057  BP: (!) 163/82  Pulse: 62  Weight: 144 lb (65.3 kg)  Height: 5\' 2"  (1.575 m)   Body mass index is 26.34 kg/m. No exam data present  General: well developed, pleasant elderly Caucasian female, well nourished, seated, in no evident distress Head: head normocephalic and atraumatic.   Neck: supple with no carotid or supraclavicular bruits Cardiovascular: regular rate and rhythm, no murmurs Musculoskeletal: no deformity Skin:  no rash/petichiae Vascular:  Normal pulses all extremities  Neurologic Exam Mental Status: Awake and fully alert. Oriented to place and time. Recent and remote memory  intact. Attention span, concentration and fund of knowledge appropriate. Mood and affect appropriate.  Cranial Nerves: Fundoscopic exam reveals sharp disc margins. Pupils equal, briskly reactive to light. Extraocular movements full without nystagmus. Hearing intact. Facial sensation intact. Face, tongue, palate moves normally and symmetrically.  Motor: Normal bulk and tone. Normal strength in all tested extremity muscles. Sensory.: intact to touch , pinprick , position and vibratory sensation.  Coordination: Rapid alternating movements normal in all extremities. Finger-to-nose and heel-to-shin performed accurately bilaterally. Gait and Station: Arises from chair without difficulty. Stance is normal. Gait demonstrates normal stride length and balance . Able to heel, toe and tandem walk without difficulty.  Reflexes: 1+ and symmetric. Toes downgoing.    NIHSS  0 Modified Rankin  0    Diagnostic Data (Labs, Imaging, Testing)  CT HEAD WO CONTRAST CT ANGIO NECK W OR WO CONTRAST CT ANGIO HEAD W OR WO CONTRAST 02/14/18 IMPRESSION: Head CT does not show any acute infarction. Old ischemic changes seen in the left PCA territory. CT angiography of the neck vessels shows atherosclerosis but no stenosis at either carotid bifurcation. No significant posterior circulation finding in the neck. Intracranial CT angiography shows intracranial atherosclerotic change, most pronounced affecting the posterior cerebral artery branches but also present within  the anterior and middle cerebral artery branches. The appearance is similar to an MR angiogram done earlier this year. There is tortuosity and atherosclerotic change of the distal vertebral arteries in the proximal basilar artery. Maximal stenosis of the distal right vertebral is 30-40%. Calcified left thyroid mass with multiple enlarged lymph nodes in the left side of the neck with calcification, the pattern likely to indicate metastatic thyroid  carcinoma.  ECHOCARDIOGRAM 02/15/2018 Study Conclusions - Left ventricle: The cavity size was normal. Wall thickness was   increased in a pattern of mild LVH. There was moderate focal   basal hypertrophy of the septum. Systolic function was normal.   The estimated ejection fraction was in the range of 55% to 60%.   Wall motion was normal; there were no regional wall motion   abnormalities. Doppler parameters are consistent with abnormal   left ventricular relaxation (grade 1 diastolic dysfunction). - Mitral valve: Calcified annulus. - Atrial septum: No defect or patent foramen ovale was identified   by color doppler.   ASSESSMENT: VERNETTA DIZDAREVIC is a 67 y.o. year old female here with left PCA infarct on 09/04/17 secondary to small vessel disease. Vascular risk factors are HTN, HLD, and DM.  Patient had recent TIA on 02/14/2018 possibly secondary to large vessel arthrosclerosis and noncompliance of recommended treatment plan for stroke prevention.  Patient states overall she has been doing well without residual deficits and states full compliance with treatment regimen.    PLAN: -Continue aspirin 81 mg and Plavix 75 mg along with Crestor for secondary stroke prevention -Advised to stop aspirin 81 mg and continue Plavix only after 05/18/2018 as this will be 3 months DAPT -Obtain lab work 04/26/2018 and request results be sent to our office -f/u with PCP regarding HTN, HLD and DM management -f/u with ENT as scheduled for discussion regarding possible biopsy in October/November -Advised to continue to stay active and maintain a healthy diet -Educated patient on importance of compliance with treatment regimen and medication therapy along with notifying us or her PCP if she is unable to tolerate a certain medication prior to stopping -patient verbalizes understanding  -Continue to monitor blood pressure at home -Maintain strict control of hypertension with blood pressure goal below 130/90,  diabetes with hemoglobin A1c goal below 6.5% and cholesterol with LDL cholesterol (bad cholesterol) goal below 70 mg/dL. I also advised the patient to eat a healthy diet with plenty of whole grains, cereals, fruits and vegetables, exercise regularly and maintain ideal body weight.  Follow-up in 3 months or call earlier if needed  -Greater than 50% time during this 25 minute consultation visit was spent on counseling and coordination of care, education regarding diagnosis of prior stroke and TIA, importance of medication compliance, and review of risk factors including HLD, DM and HTN (risk factors), discussion about risk benefit of anticoagulation and answering questions.   Follow up 6 months or call earlier if needed   Venancio Poisson, [facility]  [facility] [address] [place], [place] [address]  Phone [phone] Fax [phone]

## 2018-04-21 NOTE — Patient Instructions (Addendum)
Continue aspirin 81 mg daily and clopidogrel 75 mg daily  and Crestor  for secondary stroke prevention  Stop aspirin 81mg  after 05/18/18 and continue on plavix only  Obtain lab work Wednesday and have results sent to our office  Continue to follow up with PCP regarding cholesterol, diabetes and blood pressure management   Continue to follow up with ENT for possible biopsy in October/November   Continue to stay active and maintain a healthy diet  Continue to monitor blood pressure at home  Maintain strict control of hypertension with blood pressure goal below 130/90, diabetes with hemoglobin A1c goal below 6.5% and cholesterol with LDL cholesterol (bad cholesterol) goal below 70 mg/dL. I also advised the patient to eat a healthy diet with plenty of whole grains, cereals, fruits and vegetables, exercise regularly and maintain ideal body weight.  Followup in the future with me in 3 months or call earlier if needed       Thank you for coming to see [place] at [facility]. I hope we have been able to provide you high quality care today.  You may receive a patient satisfaction survey over the next few weeks. We would appreciate your feedback and comments so that we may continue to improve ourselves and the health of our patients.

## 2018-04-23 NOTE — Progress Notes (Signed)
I agree with the above plan 

## 2018-04-26 DIAGNOSIS — E119 Type 2 diabetes mellitus without complications: Secondary | ICD-10-CM | POA: Diagnosis not present

## 2018-04-26 DIAGNOSIS — E785 Hyperlipidemia, unspecified: Secondary | ICD-10-CM | POA: Diagnosis not present

## 2018-05-04 DIAGNOSIS — M0579 Rheumatoid arthritis with rheumatoid factor of multiple sites without organ or systems involvement: Secondary | ICD-10-CM | POA: Diagnosis not present

## 2018-05-08 DIAGNOSIS — M542 Cervicalgia: Secondary | ICD-10-CM | POA: Diagnosis not present

## 2018-05-08 DIAGNOSIS — Z23 Encounter for immunization: Secondary | ICD-10-CM | POA: Diagnosis not present

## 2018-05-23 ENCOUNTER — Other Ambulatory Visit (INDEPENDENT_AMBULATORY_CARE_PROVIDER_SITE_OTHER): Payer: Self-pay | Admitting: Otolaryngology

## 2018-05-23 DIAGNOSIS — E079 Disorder of thyroid, unspecified: Secondary | ICD-10-CM

## 2018-05-23 DIAGNOSIS — R599 Enlarged lymph nodes, unspecified: Secondary | ICD-10-CM

## 2018-05-29 DIAGNOSIS — M0579 Rheumatoid arthritis with rheumatoid factor of multiple sites without organ or systems involvement: Secondary | ICD-10-CM | POA: Diagnosis not present

## 2018-05-29 DIAGNOSIS — M79644 Pain in right finger(s): Secondary | ICD-10-CM | POA: Diagnosis not present

## 2018-05-29 DIAGNOSIS — M353 Polymyalgia rheumatica: Secondary | ICD-10-CM | POA: Diagnosis not present

## 2018-05-29 DIAGNOSIS — M8580 Other specified disorders of bone density and structure, unspecified site: Secondary | ICD-10-CM | POA: Diagnosis not present

## 2018-05-29 DIAGNOSIS — M797 Fibromyalgia: Secondary | ICD-10-CM | POA: Diagnosis not present

## 2018-05-29 DIAGNOSIS — M25519 Pain in unspecified shoulder: Secondary | ICD-10-CM | POA: Diagnosis not present

## 2018-05-29 DIAGNOSIS — M1990 Unspecified osteoarthritis, unspecified site: Secondary | ICD-10-CM | POA: Diagnosis not present

## 2018-05-29 DIAGNOSIS — Z79899 Other long term (current) drug therapy: Secondary | ICD-10-CM | POA: Diagnosis not present

## 2018-06-01 DIAGNOSIS — M0579 Rheumatoid arthritis with rheumatoid factor of multiple sites without organ or systems involvement: Secondary | ICD-10-CM | POA: Diagnosis not present

## 2018-06-05 ENCOUNTER — Other Ambulatory Visit: Payer: Self-pay | Admitting: Student

## 2018-06-05 ENCOUNTER — Encounter (HOSPITAL_COMMUNITY): Payer: Self-pay

## 2018-06-05 ENCOUNTER — Ambulatory Visit (HOSPITAL_COMMUNITY)
Admission: RE | Admit: 2018-06-05 | Discharge: 2018-06-05 | Disposition: A | Discharge status: Home / Self Care | Payer: PPO | Source: Ambulatory Visit | Attending: Otolaryngology | Admitting: Otolaryngology

## 2018-06-26 DIAGNOSIS — E119 Type 2 diabetes mellitus without complications: Secondary | ICD-10-CM | POA: Diagnosis not present

## 2018-06-26 DIAGNOSIS — H26493 Other secondary cataract, bilateral: Secondary | ICD-10-CM | POA: Diagnosis not present

## 2018-06-26 DIAGNOSIS — H524 Presbyopia: Secondary | ICD-10-CM | POA: Diagnosis not present

## 2018-06-26 DIAGNOSIS — H401132 Primary open-angle glaucoma, bilateral, moderate stage: Secondary | ICD-10-CM | POA: Diagnosis not present

## 2018-06-29 DIAGNOSIS — M0579 Rheumatoid arthritis with rheumatoid factor of multiple sites without organ or systems involvement: Secondary | ICD-10-CM | POA: Diagnosis not present

## 2018-07-04 DIAGNOSIS — D440 Neoplasm of uncertain behavior of thyroid gland: Secondary | ICD-10-CM | POA: Diagnosis not present

## 2018-07-19 ENCOUNTER — Other Ambulatory Visit: Payer: Self-pay | Admitting: Radiology

## 2018-07-20 ENCOUNTER — Other Ambulatory Visit: Payer: Self-pay

## 2018-07-20 ENCOUNTER — Other Ambulatory Visit (INDEPENDENT_AMBULATORY_CARE_PROVIDER_SITE_OTHER): Payer: Self-pay | Admitting: Otolaryngology

## 2018-07-20 ENCOUNTER — Ambulatory Visit (HOSPITAL_COMMUNITY)
Admission: RE | Admit: 2018-07-20 | Discharge: 2018-07-20 | Disposition: A | Discharge status: Home / Self Care | Payer: PPO | Source: Ambulatory Visit | Attending: Otolaryngology | Admitting: Otolaryngology

## 2018-07-20 ENCOUNTER — Encounter (HOSPITAL_COMMUNITY): Payer: Self-pay

## 2018-07-20 DIAGNOSIS — C73 Malignant neoplasm of thyroid gland: Secondary | ICD-10-CM | POA: Diagnosis not present

## 2018-07-20 DIAGNOSIS — Z8673 Personal history of transient ischemic attack (TIA), and cerebral infarction without residual deficits: Secondary | ICD-10-CM | POA: Diagnosis not present

## 2018-07-20 DIAGNOSIS — E785 Hyperlipidemia, unspecified: Secondary | ICD-10-CM | POA: Diagnosis not present

## 2018-07-20 DIAGNOSIS — Z79899 Other long term (current) drug therapy: Secondary | ICD-10-CM | POA: Insufficient documentation

## 2018-07-20 DIAGNOSIS — K219 Gastro-esophageal reflux disease without esophagitis: Secondary | ICD-10-CM | POA: Insufficient documentation

## 2018-07-20 DIAGNOSIS — M353 Polymyalgia rheumatica: Secondary | ICD-10-CM | POA: Insufficient documentation

## 2018-07-20 DIAGNOSIS — R590 Localized enlarged lymph nodes: Secondary | ICD-10-CM | POA: Diagnosis not present

## 2018-07-20 DIAGNOSIS — E119 Type 2 diabetes mellitus without complications: Secondary | ICD-10-CM | POA: Insufficient documentation

## 2018-07-20 DIAGNOSIS — I10 Essential (primary) hypertension: Secondary | ICD-10-CM | POA: Insufficient documentation

## 2018-07-20 DIAGNOSIS — Z8249 Family history of ischemic heart disease and other diseases of the circulatory system: Secondary | ICD-10-CM | POA: Diagnosis not present

## 2018-07-20 DIAGNOSIS — C770 Secondary and unspecified malignant neoplasm of lymph nodes of head, face and neck: Secondary | ICD-10-CM | POA: Diagnosis not present

## 2018-07-20 DIAGNOSIS — M069 Rheumatoid arthritis, unspecified: Secondary | ICD-10-CM | POA: Insufficient documentation

## 2018-07-20 DIAGNOSIS — E042 Nontoxic multinodular goiter: Secondary | ICD-10-CM | POA: Diagnosis present

## 2018-07-20 DIAGNOSIS — E079 Disorder of thyroid, unspecified: Secondary | ICD-10-CM

## 2018-07-20 DIAGNOSIS — R599 Enlarged lymph nodes, unspecified: Secondary | ICD-10-CM

## 2018-07-20 DIAGNOSIS — C779 Secondary and unspecified malignant neoplasm of lymph node, unspecified: Secondary | ICD-10-CM | POA: Diagnosis not present

## 2018-07-20 DIAGNOSIS — C801 Malignant (primary) neoplasm, unspecified: Secondary | ICD-10-CM | POA: Diagnosis not present

## 2018-07-20 DIAGNOSIS — E041 Nontoxic single thyroid nodule: Secondary | ICD-10-CM | POA: Diagnosis not present

## 2018-07-20 MED ORDER — FENTANYL CITRATE (PF) 100 MCG/2ML IJ SOLN
INTRAMUSCULAR | Status: AC
  Filled 2018-07-20: qty 4

## 2018-07-20 MED ORDER — MIDAZOLAM HCL 2 MG/2ML IJ SOLN
INTRAMUSCULAR | Status: AC | PRN
  Administered 2018-07-20 (×3): 1 mg via INTRAVENOUS

## 2018-07-20 MED ORDER — FENTANYL CITRATE (PF) 100 MCG/2ML IJ SOLN
INTRAMUSCULAR | Status: AC | PRN
  Administered 2018-07-20 (×3): 50 ug via INTRAVENOUS

## 2018-07-20 MED ORDER — LIDOCAINE HCL (PF) 1 % IJ SOLN
INTRAMUSCULAR | Status: AC
  Filled 2018-07-20: qty 30

## 2018-07-20 MED ORDER — SODIUM CHLORIDE 0.9 % IV SOLN
INTRAVENOUS | Status: DC
  Administered 2018-07-20: 14:00:00 via INTRAVENOUS

## 2018-07-20 MED ORDER — MIDAZOLAM HCL 2 MG/2ML IJ SOLN
INTRAMUSCULAR | Status: AC
  Filled 2018-07-20: qty 4

## 2018-07-20 NOTE — Discharge Instructions (Signed)
Needle Biopsy, Care After °These instructions give you information about caring for yourself after your procedure. Your doctor may also give you more specific instructions. Call your doctor if you have any problems or questions after your procedure. °Follow these instructions at home: °· Rest as told by your doctor. °· Take medicines only as told by your doctor. °· There are many different ways to close and cover the biopsy site, including stitches (sutures), skin glue, and adhesive strips. Follow instructions from your doctor about: °? How to take care of your biopsy site. °? When and how you should change your bandage (dressing). °? When you should remove your dressing. °? Removing whatever was used to close your biopsy site. °· Check your biopsy site every day for signs of infection. Watch for: °? Redness, swelling, or pain. °? Fluid, blood, or pus. °Contact a doctor if: °· You have a fever. °· You have redness, swelling, or pain at the biopsy site, and it lasts longer than a few days. °· You have fluid, blood, or pus coming from the biopsy site. °· You feel sick to your stomach (nauseous). °· You throw up (vomit). °Get help right away if: °· You are short of breath. °· You have trouble breathing. °· Your chest hurts. °· You feel dizzy or you pass out (faint). °· You have bleeding that does not stop with pressure or a bandage. °· You cough up blood. °· Your belly (abdomen) hurts. °This information is not intended to replace advice given to you by your health care provider. Make sure you discuss any questions you have with your health care provider. °Document Released: 07/08/2008 Document Revised: 01/01/2016 Document Reviewed: 07/22/2014 °Elsevier Interactive Patient Education © 2018 [employer] ° °

## 2018-07-20 NOTE — Sedation Documentation (Signed)
Patient states has some anxiety to nurse and doctor regarding medication and procedure.

## 2018-07-20 NOTE — Sedation Documentation (Signed)
Patient denies pain and is resting comfortably.  

## 2018-07-20 NOTE — Procedures (Signed)
Interventional Radiology Procedure:   Indications: Suspicious thyroid nodules and cervical nodes  Procedure: [place] FNA of left thyroid nodule and 2 separate cervical nodes  Findings: Irregular calcified nodule in left thyroid.  Enlarged left cervical lymph nodes  Complications: None     EBL: None  Plan: discharge to home in 1 hour.    Aasir Daigler R. Anselm Pancoast, MD  Pager: [phone]

## 2018-07-20 NOTE — H&P (Signed)
Chief Complaint: Patient was seen in consultation today for left thyroid lesion biopsy/left cervical lymph node biopsy.  Referring Physician(s): Teoh,Su  Supervising Physician: Markus Daft  Patient Status: [facility] - Out-pt  History of Present Illness: Misty Morris is a 67 y.o. female with a past medical history significant for GERD, HLD, HTN, rheumatoid arthritis, polymyalgia rheumatic, fibromyalgia, TIA and DM II who presents today for biopsy of left thyroid lesion and biopsy of left cervical lymph node. Patient presented to [facility] ED on 02/14/18 with complaints of blurred vision and tingling in her arms - she underwent CTA head/neck which did not show any acute infarction or stenosis but did show a calcified left thyroid mass with multiple enlarged lymph nodes in the left side of the neck, suspicious for metastatic thyroid carcinoma. Thyroid US was performed on 02/23/18 for follow up which showed a highly suspicious 3.6 cm inferior left nodule and regional adenopathy, with recommendations for biopsy of both.   Patient reports she feels well overall, denies any acute complaints today aside from being hungry. 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 She is looking forward to going home later today to relax and eat.   Past Medical History:  Diagnosis Date  . Fibromyalgia   . GERD (gastroesophageal reflux disease)   . Hypercholesterolemia   . Hypertension   . Polymyalgia rheumatica ([place])   . Rheumatoid arthritis ([place])    "atypical/MD 08/2017" (02/14/2018)  . Shingles   . Stroke ([place]) 08/2017   "no residual" (02/14/2018)  . TIA (transient ischemic attack) 02/14/2018  . Type 2 diabetes, diet controlled ([place])   . Vision impairment    right eye    Past Surgical History:  Procedure Laterality Date  . CATARACT EXTRACTION W/ INTRAOCULAR LENS  IMPLANT, BILATERAL    . JOINT REPLACEMENT    . OPEN REDUCTION LE FORT I FRACTURE  1980s  . PARTIAL KNEE ARTHROPLASTY Left  01/17/2017   Procedure: UNICOMPARTMENTAL LEFT KNEE;  Surgeon: Paralee Cancel, MD;  Location: [facility];  Service: Orthopedics;  Laterality: Left;  . RADIOLOGY WITH ANESTHESIA N/A 08/11/2017   Procedure: MRI OF BRAIN WITH AND WITHOUT CONSTRAST, AND OF THE ORBIT WITH AND WITHOUT CONTRAST;  Surgeon: Radiologist, Medication, MD;  Location: [place];  Service: Radiology;  Laterality: N/A;  . RADIOLOGY WITH ANESTHESIA N/A 09/06/2017   Procedure: MRI OF BRAIN;  Surgeon: Radiologist, Medication, MD;  Location: [place];  Service: Radiology;  Laterality: N/A;  . RHINOPLASTY    . TOTAL KNEE ARTHROPLASTY Right 09/30/2015   Procedure: TOTAL RIGHT KNEE ARTHROPLASTY;  Surgeon: Paralee Cancel, MD;  Location: [facility];  Service: Orthopedics;  Laterality: Right;  Marland Kitchen VAGINAL HYSTERECTOMY      Allergies: Codeine; Demerol [meperidine]; Latex; Morphine and related; Sulfa antibiotics; Tramadol; and Vicodin [hydrocodone-acetaminophen]  Medications: Prior to Admission medications   Medication Sig Start Date End Date Taking? Authorizing Provider  Abatacept (ORENCIA IV) Inject into the vein every 30 (thirty) days.   Yes [provider]  Acetylcarn-Alpha Lipoic Acid 400-200 MG CAPS Take 1 capsule by mouth 2 (two) times daily.   Yes [provider]  amLODipine (NORVASC) 5 MG tablet Take 5 mg by mouth every evening.  08/20/15  Yes [provider]  Calcium Carb-Cholecalciferol (CALCIUM 1000 + D PO) Take 1 tablet by mouth every evening.   Yes [provider]  Cholecalciferol (VITAMIN D) 2000 units CAPS Take 4,000 Units by mouth daily.   Yes [provider]  Chromium Picolinate 200 MCG CAPS Take 200 mcg by mouth daily.   Yes [provider]  Coenzyme Q10 (COQ-10) 100 MG CAPS Take 100 mg by mouth daily.   Yes [provider]  diphenhydrAMINE (BENADRYL) 25 MG tablet Take 25 mg by mouth at bedtime.   Yes [provider]  Lysine 500 MG CAPS Take 500 mg by mouth every evening.    Yes [provider]  Omega-3 Fatty Acids (FISH OIL) 1200 MG CAPS Take 1,200 mg 2 (two) times daily by mouth.   Yes [provider]  predniSONE (DELTASONE) 1 MG tablet TK 4 TS PO QD WITH A 5 MG T FOR A TOTAL DOSE OF 9 MG. 04/18/18  Yes [provider]  predniSONE (DELTASONE) 10 MG tablet Take 10 mg by mouth every evening.    Yes [provider]  rosuvastatin (CRESTOR) 20 MG tablet Take 1 tablet (20 mg total) by mouth daily at 6 PM. 02/15/18  Yes Bonnielee Haff, MD  TIMOPTIC OCUDOSE 0.5 % SOLN Place 1 drop into both eyes daily. Preservative free only 08/18/17  Yes [provider]  ZIOPTAN 0.0015 % SOLN Place 1 drop into both eyes at bedtime. 07/23/15  Yes [provider]  clopidogrel (PLAVIX) 75 MG tablet Take 1 tablet (75 mg total) by mouth daily. 02/16/18   Bonnielee Haff, MD     Family History  Problem Relation Age of Onset  . Hypertension Mother   . Seizures Mother   . Hypertension Father   . Hypertension Sister   . Pancreatic cancer Other     Social History   Socioeconomic History  . Marital status: Divorced    Spouse name: Not on file  . Number of children: Not on file  . Years of education: Not on file  . Highest education level: Not on file  Occupational History  . Not on file  Social Needs  . Financial resource strain: Not on file  . Food insecurity:    Worry: Not on file    Inability: Not on file  . Transportation needs:    Medical: Not on file    Non-medical: Not on file  Tobacco Use  . Smoking status: Former Smoker    Packs/day: 1.00    Years: 25.00    Pack years: 25.00    Types: Cigarettes    Last attempt to quit: 12/27/2011    Years since quitting: 6.5  . Smokeless tobacco: Never Used  Substance and Sexual Activity  . Alcohol use: Never    Frequency: Never  . Drug use: Yes    Types: Marijuana    Comment: 02/14/2018 "2-3 times/wk"  . Sexual activity: Not Currently  Lifestyle  . Physical activity:     Days per week: Not on file    Minutes per session: Not on file  . Stress: Not on file  Relationships  . Social connections:    Talks on phone: Not on file    Gets together: Not on file    Attends religious service: Not on file    Active member of club or organization: Not on file    Attends meetings of clubs or organizations: Not on file    Relationship status: Not on file  Other Topics Concern  . Not on file  Social History Narrative  . Not on file     Review of Systems: A 12 point ROS discussed and pertinent positives are indicated in the HPI above.  All  other systems are negative.  Review of Systems  Constitutional: Negative for chills and fever.  Respiratory: Negative for cough and shortness of breath.   Cardiovascular: Negative for chest pain.  Gastrointestinal: Negative for abdominal pain, diarrhea, nausea and vomiting.  Genitourinary: Negative for dysuria and hematuria.  Musculoskeletal: Positive for arthralgias (chronic) and back pain (chronic).  Neurological: Negative for dizziness and syncope.  Hematological: Positive for adenopathy (left side of neck). Bruises/bleeds easily (on Plavix).  Psychiatric/Behavioral: Negative for confusion.    Vital Signs: BP (!) 174/91   Pulse (!) 59   Temp (!) 97.4 F (36.3 C)   Resp 18   Ht 5\' 2"  (1.575 m)   Wt 150 lb (68 kg)   SpO2 99%   BMI 27.44 kg/m   Physical Exam Vitals signs reviewed.  Constitutional:      General: She is not in acute distress.    Appearance: Normal appearance. She is not ill-appearing or diaphoretic.  HENT:     Head: Normocephalic.  Cardiovascular:     Rate and Rhythm: Normal rate and regular rhythm.  Pulmonary:     Effort: Pulmonary effort is normal.     Breath sounds: Normal breath sounds.  Abdominal:     General: There is no distension.     Palpations: Abdomen is soft.     Tenderness: There is no abdominal tenderness.  Lymphadenopathy:     Cervical: Cervical adenopathy (left side)  present.  Skin:    General: Skin is warm and dry.  Neurological:     Mental Status: She is alert and oriented to person, place, and time.  Psychiatric:        Mood and Affect: Mood normal.        Behavior: Behavior normal.        Thought Content: Thought content normal.        Judgment: Judgment normal.      MD Evaluation Airway: WNL(top partial denture) Heart: WNL Abdomen: WNL Chest/ Lungs: WNL ASA  Classification: 3 Mallampati/Airway Score: Two   Imaging: No results found.  Labs:  CBC: Recent Labs    09/04/17 1048  09/06/17 0531 02/14/18 0938 02/14/18 0947 02/15/18 0540  WBC 10.3  --  9.1 9.5  --  11.6*  HGB 14.5   < > 12.8 14.4 15.0 15.1*  HCT 44.0   < > 39.5 45.9 44.0 48.6*  PLT 308  --  293 357  --  372   < > = values in this interval not displayed.    COAGS: Recent Labs    09/04/17 1048 02/14/18 0938  INR 0.93 0.99  APTT 28 26    BMP: Recent Labs    09/04/17 1048  09/06/17 0531 02/14/18 0938 02/14/18 0947 02/15/18 0540  NA 138   < > 140 139 140 140  K 3.3*   < > 3.6 3.6 3.6 3.4*  CL 102   < > 106 104 103 101  CO2 22  --  24 24  --  28  GLUCOSE 236*   < > 204* 193* 197* 146*  BUN 11   < > 11 14 16 12   CALCIUM 9.3  --  9.1 9.4  --  9.5  CREATININE 0.83   < > 0.74 0.83 0.70 0.80  GFRNONAA >60  --  >60 >60  --  >60  GFRAA >60  --  >60 >60  --  >60   < > = values in this interval not displayed.  LIVER FUNCTION TESTS: Recent Labs    09/04/17 1048 02/14/18 0938  BILITOT 0.7 0.7  AST 18 16  ALT 19 13  ALKPHOS 84 84  PROT 7.0 6.7  ALBUMIN 4.2 3.9    TUMOR MARKERS: No results for input(s): AFPTM, CEA, CA199, CHROMGRNA in the last 8760 hours.  Assessment and Plan:  Patient with suspicious left thyroid nodule and left sided cervical adenopathy incidentally found on CTA 02/2018 concerning for metastatic disease who presents for biopsy of thyroid nodule as well as left cervical lymph nodes.   She has been NPO since last night, last  dose of Plavix was Friday 12/6.   Risks and benefits discussed with the patient including, but not limited to bleeding, infection, damage to adjacent structures or low yield requiring additional tests.  All of the patient's questions were answered, patient is agreeable to proceed.  Consent signed and in chart.  Thank you for this interesting consult.  I greatly enjoyed meeting KAMYA WATLING and look forward to participating in their care.  A copy of this report was sent to the requesting provider on this date.  Electronically Signed: Joaquim Nam, PA-C 07/20/2018, 1:53 PM   I spent a total of  40 Minutes   in face to face in clinical consultation, greater than 50% of which was counseling/coordinating care for FNA thyroid nodule and cervical lymph node biopsy.

## 2018-07-26 ENCOUNTER — Encounter: Payer: Self-pay | Admitting: Adult Health

## 2018-07-26 ENCOUNTER — Ambulatory Visit (INDEPENDENT_AMBULATORY_CARE_PROVIDER_SITE_OTHER): Payer: PPO | Admitting: Adult Health

## 2018-07-26 VITALS — BP 155/87 | HR 77 | Wt 150.8 lb

## 2018-07-26 DIAGNOSIS — E785 Hyperlipidemia, unspecified: Secondary | ICD-10-CM

## 2018-07-26 DIAGNOSIS — Z8673 Personal history of transient ischemic attack (TIA), and cerebral infarction without residual deficits: Secondary | ICD-10-CM | POA: Diagnosis not present

## 2018-07-26 DIAGNOSIS — I10 Essential (primary) hypertension: Secondary | ICD-10-CM

## 2018-07-26 DIAGNOSIS — G459 Transient cerebral ischemic attack, unspecified: Secondary | ICD-10-CM

## 2018-07-26 DIAGNOSIS — E119 Type 2 diabetes mellitus without complications: Secondary | ICD-10-CM

## 2018-07-26 NOTE — Progress Notes (Signed)
[name] [address] [place] [address] [phone]       OFFICE FOLLOW UP NOTE  [name] Date of Birth:  1951/01/21 Medical Record Number:  [id number]   Reason for visit:  F/u visit with recent TIA  CHIEF COMPLAINT:  Chief Complaint  Patient presents with  . Follow-up    TIA follow up room 9 pt alone    HPI: Misty Morris is being seen today for initial visit in the office for left PCA infarct on 09/04/17. History obtained from patient and chart review. Reviewed all radiology images and labs personally.  Misty Morris is an 67 y.o. female with a past medical history of type 2 diabetes mellitus hypertension, dyslipidemia, fibromyalgia rheumatica who presented to the ER on 09/04/17 with right arm numbness.  Patient was in her usual state of health yesterday and went to bed around 1030pm and woke up around 4 AM this morning went to the bathroom, did not notice any abnormalities when she went to the bathroom.  Upon waking up the morning of admission, she noticed right arm and right leg numbness that seemed to get worse throughout the morning with some weakness in the right arm as well as unsteady gait.  She denies ever having such symptoms, personal history of stroke or family history of stroke.  CT head reviewed was negative for acute hemorrhage.  MRI reviewed and showed acute infarct in the left PCA territory and superimposed small acute subacute appearing infarct in the superior left occipital lobe.  MRA did show multifocal severe stenosis in the left PCA but negative for large vessel occlusion.  2D echo showed an EF of 65-70%.  LDL 143 and A1c 8.7.  It was recommended the patient be discharged on Lipitor 80 mg as well as he is aspirin and Plavix for 3 months and then continue Plavix alone.  Patient discharged home in stable condition.  10/19/2017 visit: Patient has been doing well.  She only has slight right shoulder numbness but denies other  numbness or weakness.  Patient states she was unable to tolerate her Plavix as she felt as though her chest was getting "squeezed"shortly after taking the medication.  Patient did continue to take aspirin 325 mg daily without increase in bleeding or bruising.  Did not take Lipitor as she believes they are "bad drug".  She believes is about all statin medications.  She does state that she has started to eat healthy and participate at the gym daily.  She is also been attending PT at the neuro rehabilitation center where she has 1 more visit and they have been mainly helping her for her balance.  She does have a history of retinitis pigmentosa or according to her ophthalmologist this is almost back to her baseline.  Blood pressure at today's appointment was mildly elevated at 161/91.  Patient states that she does check her blood pressure at home her systolics are usually 614E.  Her fasting sugars have been ranging from 90-107.  Denies new or worsening stroke/TIA symptoms at this time.  04/21/2018 visit: Patient returns today for follow-up visit.  Since prior visit, patient was seen in the ED on 02/15/2018 with left blurry vision and right arm and leg altered sensation.  CT head reviewed and was negative for acute infarct but did show calcified left thyroid mass with multiple enlarged lymph nodes in the neck with calcification pattern likely indicates metastatic thyroid carcinoma.  CTA of the neck showed arthrosclerosis.  CTA of head showed intracranial arthrosclerotic change affecting posterior cerebral artery branches also anterior and middle cerebral artery branches.  MRI was not performed as she requires sedation and decided to not obtain as result will not change treatment plan.  2D echo showed an EF of 55 to 60% without cardiac source of embolus.  LDL 176 and as though it was recommended to start Lipitor patient requested to be on Crestor.  Patient was previously on aspirin 325 mg daily and recommended DAPT with  aspirin and Plavix for 3 months and then single agent alone.  Recommended follow-up with ENT as outpatient for evaluation of possible thyroid malignancy.  Patient was discharged home in stable condition.   Patient has been doing well overall without residual deficits or symptoms.  She states she has been compliant with all medications.  Patient did call into office on 03/06/2018 with concerns of Plavix causing chest pain.  She states she did stop the Plavix but continued to have chest pain.  Recommended to restart Plavix and to be followed with her primary care.  She states after restarting Plavix she did not experience increasing chest pain and this has since resolved.  She continues to take both aspirin 81 mg and Plavix with bruising but denies bleeding.  Continues to take Crestor without side effects of myalgias.  Patient will be having blood work on Wednesday where she will have a lipid panel and A1c checked along with other routine labs.  Blood pressure today elevated at 163/82 and his patient does monitor this at home she states it is typically lower but she is currently experiencing pain in her bilateral thumbs due to arthritis.  She did have follow-up thyroid ultrasound on 02/23/2018 and a follow-up biopsy was recommended to rule out possible carcinoma.  She was advised by ENT to follow-up in October/November as that will be 3 months post TIA event as she will need to stop Plavix prior to biopsy.  She has returned to all prior activities.  She is currently driving as she was released by her ophthalmologist as her vision has been improving.  She has been working out at [employer] daily without complication.  She states she understands importance of compliance with medication recommendations for secondary stroke prevention.  Denies new or worsening stroke/TIA symptoms.  Interval history 07/26/2018: Patient is being seen today for 54-month follow-up visit. She continues on plavix with mild bruising but no  bleeding. Continues on crestor without side effects of myalgias. She will be having lab work done tomorrow morning with PCP. Her prior lab work showed decreased lipid panel levels. Blood pressure today 155/87 but she does monitor at home and typically range 130's. She continues to follow with opthalmology and was told her vision is stable. She continues to drive short distances and avoids busy highways. She is aware of her limitations. She does endorse difficulty with close vision but this has been stable and present since her first stroke. She did have biopsy of thyroid obtained last week - no results available at this time.  Overall, patient feels as though she has been doing well and is able to be active and maintain independence without difficulty.  No further concerns at this time.  Denies new or worsening stroke/TIA symptoms.   ROS:   14 system review of systems performed and negative with exception of no complaints  PMH:  Past Medical History:  Diagnosis Date  . Fibromyalgia   . GERD (gastroesophageal reflux disease)   .  Hypercholesterolemia   . Hypertension   . Polymyalgia rheumatica ([place])   . Rheumatoid arthritis ([place])    "atypical/MD 08/2017" (02/14/2018)  . Shingles   . Stroke ([place]) 08/2017   "no residual" (02/14/2018)  . TIA (transient ischemic attack) 02/14/2018  . Type 2 diabetes, diet controlled ([place])   . Vision impairment    right eye    PSH:  Past Surgical History:  Procedure Laterality Date  . CATARACT EXTRACTION W/ INTRAOCULAR LENS  IMPLANT, BILATERAL    . JOINT REPLACEMENT    . OPEN REDUCTION LE FORT I FRACTURE  1980s  . PARTIAL KNEE ARTHROPLASTY Left 01/17/2017   Procedure: UNICOMPARTMENTAL LEFT KNEE;  Surgeon: Paralee Cancel, MD;  Location: [facility];  Service: Orthopedics;  Laterality: Left;  . RADIOLOGY WITH ANESTHESIA N/A 08/11/2017   Procedure: MRI OF BRAIN WITH AND WITHOUT CONSTRAST, AND OF THE ORBIT WITH AND WITHOUT CONTRAST;  Surgeon: Radiologist, Medication, MD;   Location: [facility];  Service: Radiology;  Laterality: N/A;  . RADIOLOGY WITH ANESTHESIA N/A 09/06/2017   Procedure: MRI OF BRAIN;  Surgeon: Radiologist, Medication, MD;  Location: [facility];  Service: Radiology;  Laterality: N/A;  . RHINOPLASTY    . TOTAL KNEE ARTHROPLASTY Right 09/30/2015   Procedure: TOTAL RIGHT KNEE ARTHROPLASTY;  Surgeon: Paralee Cancel, MD;  Location: [facility];  Service: Orthopedics;  Laterality: Right;  Marland Kitchen VAGINAL HYSTERECTOMY      Social History:  Social History   Socioeconomic History  . Marital status: Divorced    Spouse name: Not on file  . Number of children: Not on file  . Years of education: Not on file  . Highest education level: Not on file  Occupational History  . Not on file  Social Needs  . Financial resource strain: Not on file  . Food insecurity:    Worry: Not on file    Inability: Not on file  . Transportation needs:    Medical: Not on file    Non-medical: Not on file  Tobacco Use  . Smoking status: Former Smoker    Packs/day: 1.00    Years: 25.00    Pack years: 25.00    Types: Cigarettes    Last attempt to quit: 12/27/2011    Years since quitting: 6.5  . Smokeless tobacco: Never Used  Substance and Sexual Activity  . Alcohol use: Never    Frequency: Never  . Drug use: Yes    Types: Marijuana    Comment: 02/14/2018 "2-3 times/wk"  . Sexual activity: Not Currently  Lifestyle  . Physical activity:    Days per week: Not on file    Minutes per session: Not on file  . Stress: Not on file  Relationships  . Social connections:    Talks on phone: Not on file    Gets together: Not on file    Attends religious service: Not on file    Active member of club or organization: Not on file    Attends meetings of clubs or organizations: Not on file    Relationship status: Not on file  . Intimate partner violence:    Fear of current or ex partner: Not on file    Emotionally abused: Not on file    Physically abused: Not on file    Forced sexual activity:  Not on file  Other Topics Concern  . Not on file  Social History Narrative  . Not on file    Family History:  Family History  Problem Relation Age of  Onset  . Hypertension Mother   . Seizures Mother   . Hypertension Father   . Hypertension Sister   . Pancreatic cancer Other     Medications:   Current Outpatient Medications on File Prior to Visit  Medication Sig Dispense Refill  . Abatacept (ORENCIA IV) Inject into the vein every 30 (thirty) days.    Eldridge Abrahams Lipoic Acid 400-200 MG CAPS Take 1 capsule by mouth 2 (two) times daily.    Marland Kitchen amLODipine (NORVASC) 5 MG tablet Take 5 mg by mouth every evening.   2  . Calcium Carb-Cholecalciferol (CALCIUM 1000 + D PO) Take 1 tablet by mouth every evening.    . Cholecalciferol (VITAMIN D) 2000 units CAPS Take 4,000 Units by mouth daily.    . Chromium Picolinate 200 MCG CAPS Take 200 mcg by mouth daily.    . clopidogrel (PLAVIX) 75 MG tablet Take 1 tablet (75 mg total) by mouth daily. 30 tablet 3  . Coenzyme Q10 (COQ-10) 100 MG CAPS Take 100 mg by mouth daily.    . diphenhydrAMINE (BENADRYL) 25 MG tablet Take 25 mg by mouth at bedtime.    Marland Kitchen Lysine 500 MG CAPS Take 500 mg by mouth every evening.    . Omega-3 Fatty Acids (FISH OIL) 1200 MG CAPS Take 1,200 mg 2 (two) times daily by mouth.    . predniSONE (DELTASONE) 10 MG tablet Take 10 mg by mouth every evening.     . rosuvastatin (CRESTOR) 20 MG tablet Take 1 tablet (20 mg total) by mouth daily at 6 PM. 30 tablet 3  . TIMOPTIC OCUDOSE 0.5 % SOLN Place 1 drop into both eyes daily. Preservative free only  0  . ZIOPTAN 0.0015 % SOLN Place 1 drop into both eyes at bedtime.  3  . rosuvastatin (CRESTOR) 10 MG tablet TK 1 T PO QD  1   No current facility-administered medications on file prior to visit.     Allergies:   Allergies  Allergen Reactions  . Codeine Hives  . Demerol [Meperidine] Hives  . Latex Dermatitis and Other (See Comments)    Blistering   . Morphine And Related  Hives  . Sulfa Antibiotics Hives  . Tramadol Hives  . Vicodin [Hydrocodone-Acetaminophen] Itching    Elevated blood pressure. Tolerates low doses.     Physical Exam  Vitals:   07/26/18 1415  BP: (!) 155/87  Pulse: 77  Weight: 150 lb 12.8 oz (68.4 kg)   Body mass index is 27.58 kg/m. No exam data present  General: well developed, pleasant elderly Caucasian female, well nourished, seated, in no evident distress Head: head normocephalic and atraumatic.   Neck: supple with no carotid or supraclavicular bruits Cardiovascular: regular rate and rhythm, no murmurs Musculoskeletal: no deformity Skin:  no rash/petichiae Vascular:  Normal pulses all extremities  Neurologic Exam Mental Status: Awake and fully alert. Oriented to place and time. Recent and remote memory intact. Attention span, concentration and fund of knowledge appropriate. Mood and affect appropriate.  Cranial Nerves: Pupils equal, briskly reactive to light. Extraocular movements full without nystagmus. Hearing intact. Facial sensation intact. Face, tongue, palate moves normally and symmetrically.  Motor: Normal bulk and tone. Normal strength in all tested extremity muscles. Sensory.: intact to touch , pinprick , position and vibratory sensation.  Coordination: Rapid alternating movements normal in all extremities. Finger-to-nose and heel-to-shin performed accurately bilaterally. Gait and Station: Arises from chair without difficulty. Stance is normal. Gait demonstrates normal stride length and balance . Able  to heel, toe and tandem walk without difficulty.  Reflexes: 1+ and symmetric. Toes downgoing.        Diagnostic Data (Labs, Imaging, Testing)  CT HEAD WO CONTRAST CT ANGIO NECK W OR WO CONTRAST CT ANGIO HEAD W OR WO CONTRAST 02/14/18 IMPRESSION: Head CT does not show any acute infarction. Old ischemic changes seen in the left PCA territory. CT angiography of the neck vessels shows atherosclerosis but  no stenosis at either carotid bifurcation. No significant posterior circulation finding in the neck. Intracranial CT angiography shows intracranial atherosclerotic change, most pronounced affecting the posterior cerebral artery branches but also present within the anterior and middle cerebral artery branches. The appearance is similar to an MR angiogram done earlier this year. There is tortuosity and atherosclerotic change of the distal vertebral arteries in the proximal basilar artery. Maximal stenosis of the distal right vertebral is 30-40%. Calcified left thyroid mass with multiple enlarged lymph nodes in the left side of the neck with calcification, the pattern likely to indicate metastatic thyroid carcinoma.  ECHOCARDIOGRAM 02/15/2018 Study Conclusions - Left ventricle: The cavity size was normal. Wall thickness was   increased in a pattern of mild LVH. There was moderate focal   basal hypertrophy of the septum. Systolic function was normal.   The estimated ejection fraction was in the range of 55% to 60%.   Wall motion was normal; there were no regional wall motion   abnormalities. Doppler parameters are consistent with abnormal   left ventricular relaxation (grade 1 diastolic dysfunction). - Mitral valve: Calcified annulus. - Atrial septum: No defect or patent foramen ovale was identified   by color doppler.   ASSESSMENT: JALESIA LOUDENSLAGER is a 67 y.o. year old female here with left PCA infarct on 09/04/17 secondary to small vessel disease. Vascular risk factors are HTN, HLD, and DM.  Patient had recent TIA on 02/14/2018 possibly secondary to large vessel arthrosclerosis and noncompliance of recommended treatment plan for stroke prevention.  Patient is being seen today for 80-month follow-up and overall has been doing well without residual deficit or recurring of symptoms.    PLAN: -Continue Plavix 75 mg along with Crestor 20 mg for secondary stroke prevention -f/u with PCP  regarding HTN, HLD and DM management -request lab work be faxed to office -Advised to continue to stay active and maintain a healthy diet -Advised to continue to stay active and maintain a healthy diet -Continue to monitor blood pressure at home -Maintain strict control of hypertension with blood pressure goal below 130/90, diabetes with hemoglobin A1c goal below 6.5% and cholesterol with LDL cholesterol (bad cholesterol) goal below 70 mg/dL. I also advised the patient to eat a healthy diet with plenty of whole grains, cereals, fruits and vegetables, exercise regularly and maintain ideal body weight.  Follow-up in 6 months or call earlier if needed  -Greater than 50% time during this 25 minute consultation visit was spent on counseling and coordination of care, education regarding diagnosis of prior stroke and TIA, importance of medication compliance, and review of risk factors including HLD, DM and HTN (risk factors), discussion about risk benefit of anticoagulation and answering questions.   Follow up 6 months or call earlier if needed   Venancio Poisson, [facility]  [facility] [address] [place], [place] [address]  Phone [phone] Fax [phone]

## 2018-07-26 NOTE — Patient Instructions (Signed)
Continue clopidogrel 75 mg daily  and Crestor 20mg  for secondary stroke prevention  Continue to follow up with PCP regarding cholesterol, blood pressure and diabetes management   Continue to stay active and maintain a healthy diet  Continue to monitor blood pressure at home  Maintain strict control of hypertension with blood pressure goal below 130/90, diabetes with hemoglobin A1c goal below 6.5% and cholesterol with LDL cholesterol (bad cholesterol) goal below 70 mg/dL. I also advised the patient to eat a healthy diet with plenty of whole grains, cereals, fruits and vegetables, exercise regularly and maintain ideal body weight.  Followup in the future with me in 6 months or call earlier if needed       Thank you for coming to see [place] at [facility]. I hope we have been able to provide you high quality care today.  You may receive a patient satisfaction survey over the next few weeks. We would appreciate your feedback and comments so that we may continue to improve ourselves and the health of our patients.

## 2018-07-27 DIAGNOSIS — M0579 Rheumatoid arthritis with rheumatoid factor of multiple sites without organ or systems involvement: Secondary | ICD-10-CM | POA: Diagnosis not present

## 2018-07-27 DIAGNOSIS — E119 Type 2 diabetes mellitus without complications: Secondary | ICD-10-CM | POA: Diagnosis not present

## 2018-07-27 DIAGNOSIS — E785 Hyperlipidemia, unspecified: Secondary | ICD-10-CM | POA: Diagnosis not present

## 2018-07-27 DIAGNOSIS — R809 Proteinuria, unspecified: Secondary | ICD-10-CM | POA: Diagnosis not present

## 2018-08-03 DIAGNOSIS — M353 Polymyalgia rheumatica: Secondary | ICD-10-CM | POA: Diagnosis not present

## 2018-08-03 DIAGNOSIS — Z8673 Personal history of transient ischemic attack (TIA), and cerebral infarction without residual deficits: Secondary | ICD-10-CM | POA: Diagnosis not present

## 2018-08-03 DIAGNOSIS — E785 Hyperlipidemia, unspecified: Secondary | ICD-10-CM | POA: Diagnosis not present

## 2018-08-03 DIAGNOSIS — Z0000 Encounter for general adult medical examination without abnormal findings: Secondary | ICD-10-CM | POA: Diagnosis not present

## 2018-08-03 DIAGNOSIS — E119 Type 2 diabetes mellitus without complications: Secondary | ICD-10-CM | POA: Diagnosis not present

## 2018-08-03 DIAGNOSIS — M1990 Unspecified osteoarthritis, unspecified site: Secondary | ICD-10-CM | POA: Diagnosis not present

## 2018-08-03 DIAGNOSIS — I10 Essential (primary) hypertension: Secondary | ICD-10-CM | POA: Diagnosis not present

## 2018-08-11 DIAGNOSIS — C73 Malignant neoplasm of thyroid gland: Secondary | ICD-10-CM | POA: Diagnosis not present

## 2018-08-13 NOTE — Progress Notes (Signed)
I agree with the above plan 

## 2018-08-23 DIAGNOSIS — C73 Malignant neoplasm of thyroid gland: Secondary | ICD-10-CM | POA: Diagnosis not present

## 2018-08-24 DIAGNOSIS — C73 Malignant neoplasm of thyroid gland: Secondary | ICD-10-CM | POA: Diagnosis not present

## 2018-08-24 DIAGNOSIS — C770 Secondary and unspecified malignant neoplasm of lymph nodes of head, face and neck: Secondary | ICD-10-CM | POA: Diagnosis not present

## 2018-08-29 DIAGNOSIS — M797 Fibromyalgia: Secondary | ICD-10-CM | POA: Diagnosis not present

## 2018-08-29 DIAGNOSIS — M1990 Unspecified osteoarthritis, unspecified site: Secondary | ICD-10-CM | POA: Diagnosis not present

## 2018-08-29 DIAGNOSIS — C73 Malignant neoplasm of thyroid gland: Secondary | ICD-10-CM | POA: Diagnosis not present

## 2018-08-29 DIAGNOSIS — M0579 Rheumatoid arthritis with rheumatoid factor of multiple sites without organ or systems involvement: Secondary | ICD-10-CM | POA: Diagnosis not present

## 2018-08-29 DIAGNOSIS — M25519 Pain in unspecified shoulder: Secondary | ICD-10-CM | POA: Diagnosis not present

## 2018-08-29 DIAGNOSIS — M79644 Pain in right finger(s): Secondary | ICD-10-CM | POA: Diagnosis not present

## 2018-08-29 DIAGNOSIS — M8580 Other specified disorders of bone density and structure, unspecified site: Secondary | ICD-10-CM | POA: Diagnosis not present

## 2018-08-29 DIAGNOSIS — Z79899 Other long term (current) drug therapy: Secondary | ICD-10-CM | POA: Diagnosis not present

## 2018-08-29 DIAGNOSIS — M353 Polymyalgia rheumatica: Secondary | ICD-10-CM | POA: Diagnosis not present

## 2018-09-08 DIAGNOSIS — Z79890 Hormone replacement therapy (postmenopausal): Secondary | ICD-10-CM | POA: Diagnosis not present

## 2018-09-08 DIAGNOSIS — Z96651 Presence of right artificial knee joint: Secondary | ICD-10-CM | POA: Diagnosis not present

## 2018-09-08 DIAGNOSIS — C7989 Secondary malignant neoplasm of other specified sites: Secondary | ICD-10-CM | POA: Diagnosis not present

## 2018-09-08 DIAGNOSIS — Z8673 Personal history of transient ischemic attack (TIA), and cerebral infarction without residual deficits: Secondary | ICD-10-CM | POA: Diagnosis not present

## 2018-09-08 DIAGNOSIS — Z7952 Long term (current) use of systemic steroids: Secondary | ICD-10-CM | POA: Diagnosis not present

## 2018-09-08 DIAGNOSIS — E785 Hyperlipidemia, unspecified: Secondary | ICD-10-CM | POA: Diagnosis not present

## 2018-09-08 DIAGNOSIS — M353 Polymyalgia rheumatica: Secondary | ICD-10-CM | POA: Diagnosis not present

## 2018-09-08 DIAGNOSIS — C73 Malignant neoplasm of thyroid gland: Secondary | ICD-10-CM | POA: Diagnosis not present

## 2018-09-08 DIAGNOSIS — R1310 Dysphagia, unspecified: Secondary | ICD-10-CM | POA: Diagnosis not present

## 2018-09-08 DIAGNOSIS — E1165 Type 2 diabetes mellitus with hyperglycemia: Secondary | ICD-10-CM | POA: Diagnosis not present

## 2018-09-08 DIAGNOSIS — H409 Unspecified glaucoma: Secondary | ICD-10-CM | POA: Diagnosis not present

## 2018-09-08 DIAGNOSIS — M069 Rheumatoid arthritis, unspecified: Secondary | ICD-10-CM | POA: Diagnosis not present

## 2018-09-08 DIAGNOSIS — I10 Essential (primary) hypertension: Secondary | ICD-10-CM | POA: Diagnosis not present

## 2018-09-08 DIAGNOSIS — M797 Fibromyalgia: Secondary | ICD-10-CM | POA: Diagnosis not present

## 2018-09-08 DIAGNOSIS — Z87891 Personal history of nicotine dependence: Secondary | ICD-10-CM | POA: Diagnosis not present

## 2018-09-08 DIAGNOSIS — Z800 Family history of malignant neoplasm of digestive organs: Secondary | ICD-10-CM | POA: Diagnosis not present

## 2018-09-08 DIAGNOSIS — Z794 Long term (current) use of insulin: Secondary | ICD-10-CM | POA: Diagnosis not present

## 2018-09-08 DIAGNOSIS — H6123 Impacted cerumen, bilateral: Secondary | ICD-10-CM | POA: Diagnosis not present

## 2018-09-08 DIAGNOSIS — D62 Acute posthemorrhagic anemia: Secondary | ICD-10-CM | POA: Diagnosis not present

## 2018-09-08 DIAGNOSIS — K219 Gastro-esophageal reflux disease without esophagitis: Secondary | ICD-10-CM | POA: Diagnosis not present

## 2018-09-18 DIAGNOSIS — M069 Rheumatoid arthritis, unspecified: Secondary | ICD-10-CM | POA: Insufficient documentation

## 2018-09-19 DIAGNOSIS — M353 Polymyalgia rheumatica: Secondary | ICD-10-CM | POA: Diagnosis not present

## 2018-09-19 DIAGNOSIS — T380X5A Adverse effect of glucocorticoids and synthetic analogues, initial encounter: Secondary | ICD-10-CM | POA: Diagnosis not present

## 2018-09-19 DIAGNOSIS — C771 Secondary and unspecified malignant neoplasm of intrathoracic lymph nodes: Secondary | ICD-10-CM | POA: Diagnosis not present

## 2018-09-19 DIAGNOSIS — E892 Postprocedural hypoparathyroidism: Secondary | ICD-10-CM | POA: Diagnosis not present

## 2018-09-19 DIAGNOSIS — I10 Essential (primary) hypertension: Secondary | ICD-10-CM | POA: Diagnosis not present

## 2018-09-19 DIAGNOSIS — E041 Nontoxic single thyroid nodule: Secondary | ICD-10-CM | POA: Diagnosis not present

## 2018-09-19 DIAGNOSIS — Z8673 Personal history of transient ischemic attack (TIA), and cerebral infarction without residual deficits: Secondary | ICD-10-CM | POA: Diagnosis not present

## 2018-09-19 DIAGNOSIS — H409 Unspecified glaucoma: Secondary | ICD-10-CM | POA: Diagnosis not present

## 2018-09-19 DIAGNOSIS — C7989 Secondary malignant neoplasm of other specified sites: Secondary | ICD-10-CM | POA: Diagnosis not present

## 2018-09-19 DIAGNOSIS — C73 Malignant neoplasm of thyroid gland: Secondary | ICD-10-CM | POA: Diagnosis not present

## 2018-09-19 DIAGNOSIS — Z79890 Hormone replacement therapy (postmenopausal): Secondary | ICD-10-CM | POA: Diagnosis not present

## 2018-09-19 DIAGNOSIS — Z96651 Presence of right artificial knee joint: Secondary | ICD-10-CM | POA: Diagnosis not present

## 2018-09-19 DIAGNOSIS — M797 Fibromyalgia: Secondary | ICD-10-CM | POA: Diagnosis not present

## 2018-09-19 DIAGNOSIS — C770 Secondary and unspecified malignant neoplasm of lymph nodes of head, face and neck: Secondary | ICD-10-CM | POA: Diagnosis not present

## 2018-09-19 DIAGNOSIS — Z794 Long term (current) use of insulin: Secondary | ICD-10-CM | POA: Diagnosis not present

## 2018-09-19 DIAGNOSIS — R739 Hyperglycemia, unspecified: Secondary | ICD-10-CM | POA: Diagnosis not present

## 2018-09-19 DIAGNOSIS — E890 Postprocedural hypothyroidism: Secondary | ICD-10-CM | POA: Diagnosis not present

## 2018-09-19 DIAGNOSIS — E1165 Type 2 diabetes mellitus with hyperglycemia: Secondary | ICD-10-CM | POA: Diagnosis not present

## 2018-09-19 DIAGNOSIS — M069 Rheumatoid arthritis, unspecified: Secondary | ICD-10-CM | POA: Diagnosis not present

## 2018-09-19 DIAGNOSIS — D62 Acute posthemorrhagic anemia: Secondary | ICD-10-CM | POA: Diagnosis not present

## 2018-09-19 DIAGNOSIS — K219 Gastro-esophageal reflux disease without esophagitis: Secondary | ICD-10-CM | POA: Diagnosis not present

## 2018-09-19 DIAGNOSIS — E8989 Other postprocedural endocrine and metabolic complications and disorders: Secondary | ICD-10-CM | POA: Diagnosis not present

## 2018-09-19 DIAGNOSIS — E785 Hyperlipidemia, unspecified: Secondary | ICD-10-CM | POA: Diagnosis not present

## 2018-09-19 DIAGNOSIS — Z7952 Long term (current) use of systemic steroids: Secondary | ICD-10-CM | POA: Diagnosis not present

## 2018-09-19 DIAGNOSIS — Z85850 Personal history of malignant neoplasm of thyroid: Secondary | ICD-10-CM | POA: Diagnosis not present

## 2018-09-19 DIAGNOSIS — Z800 Family history of malignant neoplasm of digestive organs: Secondary | ICD-10-CM | POA: Diagnosis not present

## 2018-09-19 DIAGNOSIS — R1310 Dysphagia, unspecified: Secondary | ICD-10-CM | POA: Diagnosis not present

## 2018-09-19 DIAGNOSIS — E099 Drug or chemical induced diabetes mellitus without complications: Secondary | ICD-10-CM | POA: Diagnosis not present

## 2018-09-19 DIAGNOSIS — Z01810 Encounter for preprocedural cardiovascular examination: Secondary | ICD-10-CM | POA: Diagnosis not present

## 2018-09-19 DIAGNOSIS — Z87891 Personal history of nicotine dependence: Secondary | ICD-10-CM | POA: Diagnosis not present

## 2018-09-19 DIAGNOSIS — H6123 Impacted cerumen, bilateral: Secondary | ICD-10-CM | POA: Diagnosis not present

## 2018-10-02 ENCOUNTER — Other Ambulatory Visit: Payer: Self-pay | Admitting: *Deleted

## 2018-10-02 NOTE — Patient Outreach (Signed)
[facility]) Care Management  10/02/2018  Misty Morris [birth year] [id number]   Transition of Care Referral   Referral Date: 10/02/2018 Referral Source: [facility] Urgent Outreach for [facility] Date of Admission: 09/19/18 Diagnosis:  Papillary thyroid carcinoma s/p tjrpodectp,u neck surgery removal of nodes  Date of Discharge: Facility: 09/25/2018 from [facility]  Insurance: [employer]  Outreach attempt # 1  Patient is able to verify HIPAA Reviewed and addressed Transitional of care referral with patient  She reports she is doing well Has not had to take pain medication only Tylenol. Last Tylenol taken was at the hospital. 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 7.7 on 07/27/18 , ischemic optic neuropathy of right eye, s/p right TKA, s/p left UKR, fibromyalgia, HDL, polymyalgia, h/o left PCA CVA (09/04/2017, Rheumatoid arthritis, former smoker   Medications: denies concerns with taking medications as prescribed, affording medications, side effects of medications and questions about medications   Appointments: f/u wednesday 10/04/18 with her surgeon Harriett Rush [place] Her sister will be taking her to her medical appointments CM discussed contact with her concierge for alternate transportation options  Advance directives: Denies need for assist with advance directives   Consent: [facility] RN CM reviewed [facility] services with patient. Patient gave verbal consent for services. Advised patient that other post discharge calls may occur to assess how the patient is doing following the recent hospitalization.  Patient voiced understanding and was appreciative of f/u call.  Plan: [facility] RN CM will close case at this time as patient has been assessed and no needs identified/needs resolved.   Pt encouraged to return a call to [place] CM prn  [facility] RN CM sent a successful outreach letter as discussed with [facility] brochure enclosed for review   L. Lavina Hamman, RN, BSN, [place] [facility] Care Coordinator Direct Number [phone] Mobile number [phone]  Main [facility] number [phone] Fax number [phone]

## 2018-10-04 DIAGNOSIS — Z882 Allergy status to sulfonamides status: Secondary | ICD-10-CM | POA: Diagnosis not present

## 2018-10-04 DIAGNOSIS — Z91040 Latex allergy status: Secondary | ICD-10-CM | POA: Diagnosis not present

## 2018-10-04 DIAGNOSIS — Z87891 Personal history of nicotine dependence: Secondary | ICD-10-CM | POA: Diagnosis not present

## 2018-10-04 DIAGNOSIS — C7989 Secondary malignant neoplasm of other specified sites: Secondary | ICD-10-CM | POA: Diagnosis not present

## 2018-10-04 DIAGNOSIS — C73 Malignant neoplasm of thyroid gland: Secondary | ICD-10-CM | POA: Diagnosis not present

## 2018-10-04 DIAGNOSIS — Z885 Allergy status to narcotic agent status: Secondary | ICD-10-CM | POA: Diagnosis not present

## 2018-10-04 DIAGNOSIS — E039 Hypothyroidism, unspecified: Secondary | ICD-10-CM | POA: Diagnosis not present

## 2018-10-04 DIAGNOSIS — Z886 Allergy status to analgesic agent status: Secondary | ICD-10-CM | POA: Diagnosis not present

## 2018-10-07 ENCOUNTER — Emergency Department (HOSPITAL_COMMUNITY): Payer: PPO

## 2018-10-07 ENCOUNTER — Observation Stay (HOSPITAL_COMMUNITY)
Admission: EM | Admit: 2018-10-07 | Discharge: 2018-10-09 | Disposition: A | Discharge status: Home / Self Care | Payer: PPO | Attending: Internal Medicine | Admitting: Internal Medicine

## 2018-10-07 ENCOUNTER — Encounter (HOSPITAL_COMMUNITY): Payer: Self-pay | Admitting: Emergency Medicine

## 2018-10-07 ENCOUNTER — Other Ambulatory Visit: Payer: Self-pay

## 2018-10-07 DIAGNOSIS — N3001 Acute cystitis with hematuria: Secondary | ICD-10-CM

## 2018-10-07 DIAGNOSIS — M353 Polymyalgia rheumatica: Secondary | ICD-10-CM | POA: Diagnosis present

## 2018-10-07 DIAGNOSIS — Z7902 Long term (current) use of antithrombotics/antiplatelets: Secondary | ICD-10-CM | POA: Insufficient documentation

## 2018-10-07 DIAGNOSIS — R509 Fever, unspecified: Secondary | ICD-10-CM | POA: Insufficient documentation

## 2018-10-07 DIAGNOSIS — E119 Type 2 diabetes mellitus without complications: Secondary | ICD-10-CM

## 2018-10-07 DIAGNOSIS — Z85850 Personal history of malignant neoplasm of thyroid: Secondary | ICD-10-CM | POA: Insufficient documentation

## 2018-10-07 DIAGNOSIS — Z8673 Personal history of transient ischemic attack (TIA), and cerebral infarction without residual deficits: Secondary | ICD-10-CM | POA: Insufficient documentation

## 2018-10-07 DIAGNOSIS — D899 Disorder involving the immune mechanism, unspecified: Secondary | ICD-10-CM | POA: Diagnosis not present

## 2018-10-07 DIAGNOSIS — M069 Rheumatoid arthritis, unspecified: Secondary | ICD-10-CM | POA: Diagnosis present

## 2018-10-07 DIAGNOSIS — E890 Postprocedural hypothyroidism: Secondary | ICD-10-CM

## 2018-10-07 DIAGNOSIS — N390 Urinary tract infection, site not specified: Secondary | ICD-10-CM | POA: Diagnosis not present

## 2018-10-07 DIAGNOSIS — D849 Immunodeficiency, unspecified: Secondary | ICD-10-CM

## 2018-10-07 DIAGNOSIS — Z7952 Long term (current) use of systemic steroids: Secondary | ICD-10-CM | POA: Insufficient documentation

## 2018-10-07 DIAGNOSIS — Z8249 Family history of ischemic heart disease and other diseases of the circulatory system: Secondary | ICD-10-CM | POA: Insufficient documentation

## 2018-10-07 DIAGNOSIS — R000 Tachycardia, unspecified: Secondary | ICD-10-CM | POA: Diagnosis not present

## 2018-10-07 DIAGNOSIS — M797 Fibromyalgia: Secondary | ICD-10-CM | POA: Diagnosis not present

## 2018-10-07 DIAGNOSIS — I10 Essential (primary) hypertension: Secondary | ICD-10-CM | POA: Diagnosis not present

## 2018-10-07 DIAGNOSIS — I639 Cerebral infarction, unspecified: Secondary | ICD-10-CM | POA: Diagnosis present

## 2018-10-07 DIAGNOSIS — E785 Hyperlipidemia, unspecified: Secondary | ICD-10-CM | POA: Diagnosis present

## 2018-10-07 DIAGNOSIS — E876 Hypokalemia: Secondary | ICD-10-CM | POA: Diagnosis not present

## 2018-10-07 DIAGNOSIS — R630 Anorexia: Secondary | ICD-10-CM | POA: Insufficient documentation

## 2018-10-07 DIAGNOSIS — B9620 Unspecified Escherichia coli [E. coli] as the cause of diseases classified elsewhere: Secondary | ICD-10-CM | POA: Insufficient documentation

## 2018-10-07 DIAGNOSIS — G459 Transient cerebral ischemic attack, unspecified: Secondary | ICD-10-CM | POA: Diagnosis present

## 2018-10-07 DIAGNOSIS — R319 Hematuria, unspecified: Secondary | ICD-10-CM

## 2018-10-07 DIAGNOSIS — E7800 Pure hypercholesterolemia, unspecified: Secondary | ICD-10-CM | POA: Diagnosis not present

## 2018-10-07 DIAGNOSIS — Z87891 Personal history of nicotine dependence: Secondary | ICD-10-CM | POA: Diagnosis not present

## 2018-10-07 DIAGNOSIS — D649 Anemia, unspecified: Secondary | ICD-10-CM | POA: Insufficient documentation

## 2018-10-07 DIAGNOSIS — A419 Sepsis, unspecified organism: Secondary | ICD-10-CM | POA: Diagnosis not present

## 2018-10-07 DIAGNOSIS — K219 Gastro-esophageal reflux disease without esophagitis: Secondary | ICD-10-CM | POA: Insufficient documentation

## 2018-10-07 DIAGNOSIS — C73 Malignant neoplasm of thyroid gland: Secondary | ICD-10-CM | POA: Diagnosis not present

## 2018-10-07 DIAGNOSIS — Z79899 Other long term (current) drug therapy: Secondary | ICD-10-CM | POA: Diagnosis not present

## 2018-10-07 DIAGNOSIS — I4510 Unspecified right bundle-branch block: Secondary | ICD-10-CM | POA: Insufficient documentation

## 2018-10-07 DIAGNOSIS — Z9889 Other specified postprocedural states: Secondary | ICD-10-CM

## 2018-10-07 LAB — INFLUENZA PANEL BY PCR (TYPE A & B)
Influenza A By PCR: NEGATIVE
Influenza B By PCR: NEGATIVE

## 2018-10-07 LAB — COMPREHENSIVE METABOLIC PANEL
ALT: 14 U/L (ref 0–44)
AST: 14 U/L — ABNORMAL LOW (ref 15–41)
Albumin: 3.5 g/dL (ref 3.5–5.0)
Alkaline Phosphatase: 63 U/L (ref 38–126)
Anion gap: 11 (ref 5–15)
BUN: 11 mg/dL (ref 8–23)
CO2: 22 mmol/L (ref 22–32)
Calcium: 8.5 mg/dL — ABNORMAL LOW (ref 8.9–10.3)
Chloride: 106 mmol/L (ref 98–111)
Creatinine, Ser: 1 mg/dL (ref 0.44–1.00)
GFR calc non Af Amer: 58 mL/min — ABNORMAL LOW (ref >60)
Glucose, Bld: 146 mg/dL — ABNORMAL HIGH (ref 70–99)
Potassium: 2.8 mmol/L — ABNORMAL LOW (ref 3.5–5.1)
Sodium: 139 mmol/L (ref 135–145)
Total Bilirubin: 0.7 mg/dL (ref 0.3–1.2)
Total Protein: 6.5 g/dL (ref 6.5–8.1)

## 2018-10-07 LAB — CBC WITH DIFFERENTIAL/PLATELET
Abs Immature Granulocytes: 0.04 10*3/uL (ref 0.00–0.07)
BASOS ABS: 0 10*3/uL (ref 0.0–0.1)
Basophils Relative: 0 %
EOS ABS: 0 10*3/uL (ref 0.0–0.5)
Eosinophils Relative: 0 %
HCT: 37.6 % (ref 36.0–46.0)
Hemoglobin: 11.7 g/dL — ABNORMAL LOW (ref 12.0–15.0)
Immature Granulocytes: 0 %
Lymphocytes Relative: 11 %
Lymphs Abs: 1.3 10*3/uL (ref 0.7–4.0)
MCH: 26.1 pg (ref 26.0–34.0)
MCHC: 31.1 g/dL (ref 30.0–36.0)
MCV: 83.9 fL (ref 80.0–100.0)
Monocytes Absolute: 0.9 10*3/uL (ref 0.1–1.0)
Monocytes Relative: 8 %
Neutro Abs: 9.4 10*3/uL — ABNORMAL HIGH (ref 1.7–7.7)
Neutrophils Relative %: 81 %
PLATELETS: 252 10*3/uL (ref 150–400)
RBC: 4.48 MIL/uL (ref 3.87–5.11)
RDW: 14.7 % (ref 11.5–15.5)
WBC: 11.7 10*3/uL — ABNORMAL HIGH (ref 4.0–10.5)
nRBC: 0 % (ref 0.0–0.2)

## 2018-10-07 LAB — HEMOGLOBIN A1C
Hgb A1c MFr Bld: 7.5 % — ABNORMAL HIGH (ref 4.8–5.6)
MEAN PLASMA GLUCOSE: 168.55 mg/dL

## 2018-10-07 LAB — URINALYSIS, ROUTINE W REFLEX MICROSCOPIC
Bilirubin Urine: NEGATIVE
Glucose, UA: NEGATIVE mg/dL
KETONES UR: 5 mg/dL — AB
Nitrite: NEGATIVE
Protein, ur: NEGATIVE mg/dL
RBC / HPF: >50 RBC/hpf — ABNORMAL HIGH (ref 0–5)
SPECIFIC GRAVITY, URINE: 1.017 (ref 1.005–1.030)
pH: 6 (ref 5.0–8.0)

## 2018-10-07 LAB — TSH: TSH: 0.771 u[IU]/mL (ref 0.350–4.500)

## 2018-10-07 LAB — MAGNESIUM: Magnesium: 1.1 mg/dL — ABNORMAL LOW (ref 1.7–2.4)

## 2018-10-07 LAB — LACTIC ACID, PLASMA: Lactic Acid, Venous: 1 mmol/L (ref 0.5–1.9)

## 2018-10-07 MED ORDER — LATANOPROST 0.005 % OP SOLN
1.0000 [drp] | Freq: Every day | OPHTHALMIC | Status: DC
  Filled 2018-10-07: qty 2.5

## 2018-10-07 MED ORDER — PREDNISONE 5 MG/5ML PO SOLN
8.0000 mg | Freq: Every day | ORAL | Status: DC
  Administered 2018-10-08 (×2): 8 mg via ORAL
  Filled 2018-10-07 (×2): qty 8

## 2018-10-07 MED ORDER — ACETAMINOPHEN 325 MG PO TABS
650.0000 mg | ORAL_TABLET | Freq: Four times a day (QID) | ORAL | Status: DC | PRN
  Administered 2018-10-08: 650 mg via ORAL
  Filled 2018-10-07 (×2): qty 2

## 2018-10-07 MED ORDER — SODIUM CHLORIDE 0.9 % IV BOLUS (SEPSIS)
250.0000 mL | Freq: Once | INTRAVENOUS | Status: AC
  Administered 2018-10-07: 250 mL via INTRAVENOUS

## 2018-10-07 MED ORDER — LEVOTHYROXINE SODIUM 25 MCG PO TABS
137.0000 ug | ORAL_TABLET | Freq: Every day | ORAL | Status: DC
  Administered 2018-10-08 – 2018-10-09 (×2): 137 ug via ORAL
  Filled 2018-10-07 (×2): qty 1

## 2018-10-07 MED ORDER — CLOPIDOGREL BISULFATE 75 MG PO TABS
75.0000 mg | ORAL_TABLET | Freq: Every day | ORAL | Status: DC
  Administered 2018-10-08 – 2018-10-09 (×2): 75 mg via ORAL
  Filled 2018-10-07 (×2): qty 1

## 2018-10-07 MED ORDER — ONDANSETRON HCL 4 MG/2ML IJ SOLN
4.0000 mg | Freq: Four times a day (QID) | INTRAMUSCULAR | Status: DC | PRN
  Administered 2018-10-07 – 2018-10-08 (×2): 4 mg via INTRAVENOUS
  Filled 2018-10-07 (×2): qty 2

## 2018-10-07 MED ORDER — SODIUM CHLORIDE 0.9 % IV SOLN
1.0000 g | Freq: Once | INTRAVENOUS | Status: AC
  Administered 2018-10-07: 1 g via INTRAVENOUS
  Filled 2018-10-07: qty 10

## 2018-10-07 MED ORDER — ACETAMINOPHEN 325 MG PO TABS
650.0000 mg | ORAL_TABLET | Freq: Once | ORAL | Status: AC
  Administered 2018-10-07: 650 mg via ORAL
  Filled 2018-10-07: qty 2

## 2018-10-07 MED ORDER — ROSUVASTATIN CALCIUM 20 MG PO TABS
20.0000 mg | ORAL_TABLET | Freq: Every day | ORAL | Status: DC
  Administered 2018-10-08: 20 mg via ORAL
  Filled 2018-10-07 (×2): qty 1

## 2018-10-07 MED ORDER — INSULIN ASPART 100 UNIT/ML ~~LOC~~ SOLN
0.0000 [IU] | Freq: Three times a day (TID) | SUBCUTANEOUS | Status: DC
  Administered 2018-10-08: 1 [IU] via SUBCUTANEOUS

## 2018-10-07 MED ORDER — POTASSIUM CHLORIDE CRYS ER 20 MEQ PO TBCR
40.0000 meq | EXTENDED_RELEASE_TABLET | Freq: Once | ORAL | Status: DC
  Filled 2018-10-07: qty 2

## 2018-10-07 MED ORDER — CHROMIUM PICOLINATE 200 MCG PO CAPS: 200.0000 ug | ORAL_CAPSULE | Freq: Every day | ORAL | Status: DC

## 2018-10-07 MED ORDER — PREDNISONE 5 MG PO TABS: 5.0000 mg | ORAL_TABLET | ORAL | Status: DC

## 2018-10-07 MED ORDER — SODIUM CHLORIDE 0.9 % IV SOLN
1.0000 g | INTRAVENOUS | Status: DC
  Administered 2018-10-08: 1 g via INTRAVENOUS
  Filled 2018-10-07: qty 10

## 2018-10-07 MED ORDER — SODIUM CHLORIDE 0.9 % IV BOLUS (SEPSIS)
1000.0000 mL | Freq: Once | INTRAVENOUS | Status: AC
  Administered 2018-10-07: 1000 mL via INTRAVENOUS

## 2018-10-07 MED ORDER — CALCIUM CARBONATE ANTACID 500 MG PO CHEW
1.0000 | CHEWABLE_TABLET | Freq: Two times a day (BID) | ORAL | Status: DC
  Administered 2018-10-07 – 2018-10-08 (×2): 200 mg via ORAL
  Filled 2018-10-07 (×2): qty 1

## 2018-10-07 MED ORDER — POTASSIUM CHLORIDE 10 MEQ/100ML IV SOLN: 10.0000 meq | INTRAVENOUS | Status: DC

## 2018-10-07 MED ORDER — PREDNISONE 5 MG/5ML PO SOLN: 8.0000 mg | Freq: Every evening | ORAL | Status: DC

## 2018-10-07 MED ORDER — LYSINE 500 MG PO CAPS: 500.0000 mg | ORAL_CAPSULE | Freq: Every evening | ORAL | Status: DC

## 2018-10-07 MED ORDER — TIMOLOL MALEATE 0.5 % OP SOLN
1.0000 [drp] | Freq: Every day | OPHTHALMIC | Status: DC
  Filled 2018-10-07: qty 5

## 2018-10-07 MED ORDER — SODIUM CHLORIDE 0.9 % IV SOLN
INTRAVENOUS | Status: AC
  Administered 2018-10-07: via INTRAVENOUS

## 2018-10-07 MED ORDER — PREDNISONE 1 MG PO TBEC: 3.0000 mg | DELAYED_RELEASE_TABLET | ORAL | Status: DC

## 2018-10-07 MED ORDER — COQ-10 100 MG PO CAPS: 100.0000 mg | ORAL_CAPSULE | Freq: Every day | ORAL | Status: DC

## 2018-10-07 MED ORDER — ENOXAPARIN SODIUM 40 MG/0.4ML ~~LOC~~ SOLN
40.0000 mg | Freq: Every day | SUBCUTANEOUS | Status: DC
  Administered 2018-10-08: 40 mg via SUBCUTANEOUS
  Filled 2018-10-07 (×2): qty 0.4

## 2018-10-07 MED ORDER — POTASSIUM CHLORIDE 10 MEQ/100ML IV SOLN
10.0000 meq | INTRAVENOUS | Status: AC
  Administered 2018-10-07 – 2018-10-08 (×3): 10 meq via INTRAVENOUS
  Filled 2018-10-07 (×3): qty 100

## 2018-10-07 MED ORDER — PREDNISONE 5 MG PO TABS
8.0000 mg | ORAL_TABLET | Freq: Every day | ORAL | Status: DC
  Filled 2018-10-07: qty 3

## 2018-10-07 MED ORDER — DIPHENHYDRAMINE HCL 25 MG PO CAPS
25.0000 mg | ORAL_CAPSULE | Freq: Every day | ORAL | Status: DC
  Administered 2018-10-07 – 2018-10-08 (×2): 25 mg via ORAL
  Filled 2018-10-07 (×2): qty 1

## 2018-10-07 MED ORDER — POTASSIUM CHLORIDE 10 MEQ/100ML IV SOLN
10.0000 meq | INTRAVENOUS | Status: AC
  Administered 2018-10-07 (×2): 10 meq via INTRAVENOUS
  Filled 2018-10-07 (×2): qty 100

## 2018-10-07 MED ORDER — ACETYLCARN-ALPHA LIPOIC ACID 400-200 MG PO CAPS: 1.0000 | ORAL_CAPSULE | Freq: Two times a day (BID) | ORAL | Status: DC

## 2018-10-07 NOTE — ED Notes (Signed)
Pt transported to xray 

## 2018-10-07 NOTE — H&P (Signed)
History and Physical    WINNI EHRHARD VOH:[id number] DOB: March 19, 1951 DOA: 10/07/2018  PCP: Janie Morning, DO Patient coming from: Home  Chief Complaint: Fever  HPI: Misty Morris is a 68 y.o. female with medical history significant of hypertension, hyperlipidemia, polymyalgia rheumatica, rheumatoid arthritis, type 2 diabetes, history of left PCA CVA 08/2017 and subsequent TIA 02/2018 on Plavix, GERD presenting to the hospital for evaluation of fever.  Patient was admitted to [facility] on September 19, 2017.  She underwent total thyroidectomy for papillary thyroid cancer.  Her hospital course was complicated by postop hypocalcemia.  She underwent left neck reexploration on 2/14 due to residual left neck lymph node found on postoperative CT imaging.  Her calcium improved during this hospitalization and patient was discharged on 2/17.    Patient states she was doing well since her recent surgery until she spiked a fever of 102.3 F at home yesterday.  She called her surgeon's office and was instructed to take Tylenol and monitor for fever recurrence.  She continued to have fevers and generalized fatigue despite taking Tylenol.  Denies noticing any swelling or drainage from her surgical incision site.  States she was seen by her surgeon 3 days ago and was told her incision site looked good.  No difficulty swallowing.  Denies having any rhinorrhea, sore throat, shortness of breath, cough, or chest pain.  Denies having any abdominal pain, nausea, or vomiting.  Denies having any suprapubic pain, dysuria, urinary frequency, or urgency.  Review of Systems: As per HPI otherwise 10 point review of systems negative.  Past Medical History:  Diagnosis Date  . Fibromyalgia   . GERD (gastroesophageal reflux disease)   . Hypercholesterolemia   . Hypertension   . Polymyalgia rheumatica ([place])   . Rheumatoid arthritis ([place])    "atypical/MD 08/2017" (02/14/2018)  . Shingles   . Stroke ([place]) 08/2017   "no  residual" (02/14/2018)  . TIA (transient ischemic attack) 02/14/2018  . Type 2 diabetes, diet controlled ([place])   . Vision impairment    right eye    Past Surgical History:  Procedure Laterality Date  . CATARACT EXTRACTION W/ INTRAOCULAR LENS  IMPLANT, BILATERAL    . JOINT REPLACEMENT    . OPEN REDUCTION LE FORT I FRACTURE  1980s  . PARTIAL KNEE ARTHROPLASTY Left 01/17/2017   Procedure: UNICOMPARTMENTAL LEFT KNEE;  Surgeon: Paralee Cancel, MD;  Location: [facility];  Service: Orthopedics;  Laterality: Left;  . RADIOLOGY WITH ANESTHESIA N/A 08/11/2017   Procedure: MRI OF BRAIN WITH AND WITHOUT CONSTRAST, AND OF THE ORBIT WITH AND WITHOUT CONTRAST;  Surgeon: Radiologist, Medication, MD;  Location: [place];  Service: Radiology;  Laterality: N/A;  . RADIOLOGY WITH ANESTHESIA N/A 09/06/2017   Procedure: MRI OF BRAIN;  Surgeon: Radiologist, Medication, MD;  Location: [place];  Service: Radiology;  Laterality: N/A;  . RHINOPLASTY    . TOTAL KNEE ARTHROPLASTY Right 09/30/2015   Procedure: TOTAL RIGHT KNEE ARTHROPLASTY;  Surgeon: Paralee Cancel, MD;  Location[facility];  Service: Orthopedics;  Laterality: Right;  Marland Kitchen VAGINAL HYSTERECTOMY       reports that she quit smoking about 6 years ago. Her smoking use included cigarettes. She has a 25.00 pack-year smoking history. She has never used smokeless tobacco. She reports current drug use. Drug: Marijuana. She reports that she does not drink alcohol.  Allergies  Allergen Reactions  . Codeine Hives  . Demerol [Meperidine] Hives  . Latex Dermatitis and Other (See Comments)    Blistering   .  Morphine And Related Hives  . Sulfa Antibiotics Hives  . Tramadol Hives  . Vicodin [Hydrocodone-Acetaminophen] Itching    Elevated blood pressure. Tolerates low doses.    Family History  Problem Relation Age of Onset  . Hypertension Mother   . Seizures Mother   . Hypertension Father   . Hypertension Sister   . Pancreatic cancer Other     Prior to Admission medications    Medication Sig Start Date End Date Taking? Authorizing Provider  Acetylcarn-Alpha Lipoic Acid 400-200 MG CAPS Take 1 capsule by mouth 2 (two) times daily.   Yes [provider]  amLODipine (NORVASC) 5 MG tablet Take 5 mg by mouth every evening.  08/20/15  Yes [provider]  calcium carbonate (TUMS - DOSED IN MG ELEMENTAL CALCIUM) 500 MG chewable tablet Chew 1 tablet by mouth 2 (two) times daily.   Yes [provider]  Chromium Picolinate 200 MCG CAPS Take 200 mcg by mouth daily.   Yes [provider]  clopidogrel (PLAVIX) 75 MG tablet Take 1 tablet (75 mg total) by mouth daily. 02/16/18  Yes Bonnielee Haff, MD  Coenzyme Q10 (COQ-10) 100 MG CAPS Take 100 mg by mouth daily.   Yes [provider]  diphenhydrAMINE (BENADRYL) 25 MG tablet Take 25 mg by mouth at bedtime.   Yes [provider]  levothyroxine (SYNTHROID, LEVOTHROID) 137 MCG tablet Take 137 mcg by mouth daily. 09/26/18 10/26/18 Yes [provider]  Lysine 500 MG CAPS Take 500 mg by mouth every evening.   Yes [provider]  rosuvastatin (CRESTOR) 20 MG tablet Take 1 tablet (20 mg total) by mouth daily at 6 PM. 02/15/18  Yes Bonnielee Haff, MD  TIMOPTIC OCUDOSE 0.5 % SOLN Place 1 drop into both eyes daily.  08/18/17  Yes [provider]  ZIOPTAN 0.0015 % SOLN Place 1 drop into both eyes at bedtime. 07/23/15  Yes [provider]  Abatacept (ORENCIA IV) Inject into the vein every 30 (thirty) days.    [provider]  predniSONE (DELTASONE) 10 MG tablet Take 10 mg by mouth every evening.     [provider]    Physical Exam: Vitals:   10/07/18 1830 10/07/18 2000 10/07/18 2015 10/07/18 2030  BP: 105/90 129/87 137/68 139/83  Pulse: 98 (!) 34 97 (!) 101  Resp: (!) 22 (!) 23 17 17   Temp:      TempSrc:      SpO2: 99% 100% 99% 97%  Weight:      Height:        Physical Exam  Constitutional: She is oriented to person, place, and  time. She appears well-developed and well-nourished. No distress.  HENT:  Head: Normocephalic.  Mouth/Throat: Oropharynx is clear and moist.  Eyes: Right eye exhibits no discharge. Left eye exhibits no discharge.  Neck: Neck supple.  Surgical incision site: Clean, dry, intact.  No drainage.  Cardiovascular: Normal rate, regular rhythm and intact distal pulses.  Pulmonary/Chest: Effort normal and breath sounds normal. No respiratory distress. She has no wheezes. She has no rales.  Abdominal: Soft. Bowel sounds are normal. She exhibits no distension. There is no abdominal tenderness. There is no rebound and no guarding.  Musculoskeletal:        General: No edema.  Neurological: She is alert and oriented to person, place, and time.  Skin: Skin is warm and dry. She is not diaphoretic.  Psychiatric: She has a normal mood and affect. Her behavior is normal.  Labs on Admission: I have personally reviewed following labs and imaging studies  CBC: Recent Labs  Lab 10/07/18 1631  WBC 11.7*  NEUTROABS 9.4*  HGB 11.7*  HCT 37.6  MCV 83.9  PLT 220   Basic Metabolic Panel: Recent Labs  Lab 10/07/18 1631  NA 139  K 2.8*  CL 106  CO2 22  GLUCOSE 146*  BUN 11  CREATININE 1.00  CALCIUM 8.5*   GFR: Estimated Creatinine Clearance: 49.4 mL/min (by C-G formula based on SCr of 1 mg/dL). Liver Function Tests: Recent Labs  Lab 10/07/18 1631  AST 14*  ALT 14  ALKPHOS 63  BILITOT 0.7  PROT 6.5  ALBUMIN 3.5   No results for input(s): LIPASE, AMYLASE in the last 168 hours. No results for input(s): AMMONIA in the last 168 hours. Coagulation Profile: No results for input(s): INR, PROTIME in the last 168 hours. Cardiac Enzymes: No results for input(s): CKTOTAL, CKMB, CKMBINDEX, TROPONINI in the last 168 hours. BNP (last 3 results) No results for input(s): PROBNP in the last 8760 hours. HbA1C: No results for input(s): HGBA1C in the last 72 hours. CBG: No results for input(s):  GLUCAP in the last 168 hours. Lipid Profile: No results for input(s): CHOL, HDL, LDLCALC, TRIG, CHOLHDL, LDLDIRECT in the last 72 hours. Thyroid Function Tests: Recent Labs    10/07/18 1718  TSH 0.771   Anemia Panel: No results for input(s): VITAMINB12, FOLATE, FERRITIN, TIBC, IRON, RETICCTPCT in the last 72 hours. Urine analysis:    Component Value Date/Time   COLORURINE YELLOW 10/07/2018 1852   APPEARANCEUR HAZY (A) 10/07/2018 1852   LABSPEC 1.017 10/07/2018 1852   PHURINE 6.0 10/07/2018 1852   GLUCOSEU NEGATIVE 10/07/2018 1852   HGBUR LARGE (A) 10/07/2018 1852   BILIRUBINUR NEGATIVE 10/07/2018 1852   KETONESUR 5 (A) 10/07/2018 1852   PROTEINUR NEGATIVE 10/07/2018 1852   UROBILINOGEN 0.2 06/04/2011 1034   NITRITE NEGATIVE 10/07/2018 1852   LEUKOCYTESUR MODERATE (A) 10/07/2018 1852    Radiological Exams on Admission: Dg Chest 2 View  Result Date: 10/07/2018 CLINICAL DATA:  Fever for 2 days. EXAM: CHEST - 2 VIEW COMPARISON:  01/13/2012 FINDINGS: The cardiomediastinal silhouette is unremarkable. Surgical clips overlying the LOWER neck noted. There is no evidence of focal airspace disease, pulmonary edema, suspicious pulmonary nodule/mass, pleural effusion, or pneumothorax. No acute bony abnormalities are identified. IMPRESSION: No active cardiopulmonary disease. Electronically Signed   By: Margarette Canada M.D.   On: 10/07/2018 18:15    EKG: Independently reviewed.  Sinus rhythm, PVCs, incomplete RBBB.  No significant change since prior tracing.  Assessment/Plan Principal Problem:   UTI (urinary tract infection) Active Problems:   Stroke ([place])   HTN (hypertension)   DM (diabetes mellitus) ([place])   Polymyalgia rheumatica ([facility])   Hyperlipidemia   TIA (transient ischemic attack)   Rheumatoid arthritis ([place])   Papillary thyroid carcinoma ([facility])   Sepsis ([facility])   Hypokalemia   S/P total thyroidectomy   Sepsis secondary to UTI -Temperature 100.1.  Tachycardic and tachypneic.   Not hypotensive.  White count 11.7.  Lactate normal. -UA with large amount of hemoglobin, greater than 50 RBCs, moderate leukocytes, and 21-50 WBCs/HPF.  -Continue ceftriaxone -Received 2.25 L normal saline boluses in the ED.  Continue maintenance IV fluid. -Tylenol PRN -Urine culture pending -Blood culture x2 pending  Hypokalemia -Potassium 2.8.  Likely secondary to decreased p.o. intake. -Replete potassium.  Check magnesium level.  Continue to monitor BMP.  History of papillary thyroid cancer status post recent  total thyroidectomy -Corrected calcium 8.9.  Continue Synthroid and calcium supplement  Hypertension -Hold home antihypertensive at this time in the setting of sepsis  Hyperlipidemia -Continue statin  Diet controlled type 2 diabetes -Check A1c.  Sliding scale insulin and CBG checks in the setting of prednisone use.  Polymyalgia rheumatica, rheumatoid arthritis -Continue home prednisone  History of CVA, TIA -Continue Plavix  DVT prophylaxis: Lovenox Code Status: Patient wishes to be full code. Family Communication: No family available. Disposition Plan: Anticipate discharge in 1 to 2 days. Consults called: None Admission status: Observation, telemetry    Shela Leff MD [facility] Pager [phone]  If 7PM-7AM, please contact night-coverage [email or web address] Password [email or web address]  10/07/2018, 10:00 PM

## 2018-10-07 NOTE — Progress Notes (Addendum)
PHARMACIST - PHYSICIAN ORDER COMMUNICATION  CONCERNING: P&T Medication Policy on Herbal Medications  DESCRIPTION:  This patient's orders for:  Acetylcarnitine/alphalipoic acid,  CoQ10, Chromium picolinate, and Lysine have been noted.  These products are classified as an "herbal" or natural product. Due to a lack of definitive safety studies or FDA approval, nonstandard manufacturing practices, plus the potential risk of unknown drug-drug interactions while on inpatient medications, the Pharmacy and Therapeutics Committee does not permit the use of "herbal" or natural products of this type within [facility].   ACTION TAKEN: The pharmacy department is unable to verify these orders at this time and your patient has been informed of this safety policy. Please reevaluate patient's clinical condition at discharge and address if the herbal or natural product(s) should be resumed at that time.

## 2018-10-07 NOTE — ED Triage Notes (Signed)
Pt reports fever and loss of appetite x1 day.  Reports thyroidectomy and lympnode removal surgery last week

## 2018-10-07 NOTE — ED Provider Notes (Signed)
[place] EMERGENCY DEPARTMENT Provider Note   CSN: [id number] Arrival date & time: 10/07/18  1606    History   Chief Complaint Chief Complaint  Patient presents with  . Fever  . Anorexia    HPI Misty Morris is a 68 y.o. female who presents to the ED with cc of Fever.  She has a PMH of DM, RA on immune modulating medications, She is s/p surgical thyroidectomy with lymph node dissection on 09/22/2018 for papillary thyroid cancer. Her course was also complicated by postoperative hypocalcemia which was improved at discharge. The patient was discharged from [facility]  On 09/24/2018. Last evening around 4pm when began having chills and noted her temperature was 102 F. She took tylenol with some improvement however today she began having fever again . She has associated generalized weakness, fatigue, malaise, and anorexia.  She denies globus sensation, perioral paresthesia or numbness, facial twitching.  Patient denies urinary symptoms, cough, abdominal pain.    HPI  Past Medical History:  Diagnosis Date  . Fibromyalgia   . GERD (gastroesophageal reflux disease)   . Hypercholesterolemia   . Hypertension   . Polymyalgia rheumatica ([place])   . Rheumatoid arthritis ([place])    "atypical/MD 08/2017" (02/14/2018)  . Shingles   . Stroke ([place]) 08/2017   "no residual" (02/14/2018)  . TIA (transient ischemic attack) 02/14/2018  . Type 2 diabetes, diet controlled ([place])   . Vision impairment    right eye    Patient Active Problem List   Diagnosis Date Noted  . Rheumatoid arthritis ([place]) 09/18/2018  . Papillary thyroid carcinoma ([place]) 08/24/2018  . TIA (transient ischemic attack) 02/14/2018  . Hyperlipidemia   . Ischemic optic neuropathy of right eye   . Stroke ([place]) 09/04/2017  . HTN (hypertension) 09/04/2017  . DM (diabetes mellitus) ([place]) 09/04/2017  . Polymyalgia rheumatica ([place]) 09/04/2017  . Fibromyalgia 09/04/2017  . S/P left UKR 01/17/2017  . Overweight (BMI  25.0-29.9) 10/01/2015  . S/P right TKA 09/30/2015    Past Surgical History:  Procedure Laterality Date  . CATARACT EXTRACTION W/ INTRAOCULAR LENS  IMPLANT, BILATERAL    . JOINT REPLACEMENT    . OPEN REDUCTION LE FORT I FRACTURE  1980s  . PARTIAL KNEE ARTHROPLASTY Left 01/17/2017   Procedure: UNICOMPARTMENTAL LEFT KNEE;  Surgeon: Paralee Cancel, MD;  [facility];  Service: Orthopedics;  Laterality: Left;  . RADIOLOGY WITH ANESTHESIA N/A 08/11/2017   Procedure: MRI OF BRAIN WITH AND WITHOUT CONSTRAST, AND OF THE ORBIT WITH AND WITHOUT CONTRAST;  Surgeon: Radiologist, Medication, MD;  Location: [place];  Service: Radiology;  Laterality: N/A;  . RADIOLOGY WITH ANESTHESIA N/A 09/06/2017   Procedure: MRI OF BRAIN;  Surgeon: Radiologist, Medication, MD;  Location: [place];  Service: Radiology;  Laterality: N/A;  . RHINOPLASTY    . TOTAL KNEE ARTHROPLASTY Right 09/30/2015   Procedure: TOTAL RIGHT KNEE ARTHROPLASTY;  Surgeon: Paralee Cancel, MD;  [facility];  Service: Orthopedics;  Laterality: Right;  Marland Kitchen VAGINAL HYSTERECTOMY       OB History   No obstetric history on file.      Home Medications    Prior to Admission medications   Medication Sig Start Date End Date Taking? Authorizing Provider  Abatacept (ORENCIA IV) Inject into the vein every 30 (thirty) days.    [provider]  Acetylcarn-Alpha Lipoic Acid 400-200 MG CAPS Take 1 capsule by mouth 2 (two) times daily.    [provider]  amLODipine (NORVASC) 5 MG  tablet Take 5 mg by mouth every evening.  08/20/15   [provider]  Calcium Carb-Cholecalciferol (CALCIUM 1000 + D PO) Take 1 tablet by mouth every evening.    [provider]  Cholecalciferol (VITAMIN D) 2000 units CAPS Take 4,000 Units by mouth daily.    [provider]  Chromium Picolinate 200 MCG CAPS Take 200 mcg by mouth daily.    [provider]  clopidogrel (PLAVIX) 75 MG tablet Take 1 tablet (75 mg total) by mouth  daily. 02/16/18   Bonnielee Haff, MD  Coenzyme Q10 (COQ-10) 100 MG CAPS Take 100 mg by mouth daily.    [provider]  diphenhydrAMINE (BENADRYL) 25 MG tablet Take 25 mg by mouth at bedtime.    [provider]  Lysine 500 MG CAPS Take 500 mg by mouth every evening.    [provider]  Omega-3 Fatty Acids (FISH OIL) 1200 MG CAPS Take 1,200 mg 2 (two) times daily by mouth.    [provider]  predniSONE (DELTASONE) 10 MG tablet Take 10 mg by mouth every evening.     [provider]  rosuvastatin (CRESTOR) 10 MG tablet TK 1 T PO QD 06/13/18   [provider]  rosuvastatin (CRESTOR) 20 MG tablet Take 1 tablet (20 mg total) by mouth daily at 6 PM. 02/15/18   Bonnielee Haff, MD  TIMOPTIC OCUDOSE 0.5 % SOLN Place 1 drop into both eyes daily. Preservative free only 08/18/17   [provider]  ZIOPTAN 0.0015 % SOLN Place 1 drop into both eyes at bedtime. 07/23/15   [provider]    Family History Family History  Problem Relation Age of Onset  . Hypertension Mother   . Seizures Mother   . Hypertension Father   . Hypertension Sister   . Pancreatic cancer Other     Social History Social History   Tobacco Use  . Smoking status: Former Smoker    Packs/day: 1.00    Years: 25.00    Pack years: 25.00    Types: Cigarettes    Last attempt to quit: 12/27/2011    Years since quitting: 6.7  . Smokeless tobacco: Never Used  Substance Use Topics  . Alcohol use: Never    Frequency: Never  . Drug use: Yes    Types: Marijuana    Comment: 02/14/2018 "2-3 times/wk"     Allergies   Codeine; Demerol [meperidine]; Latex; Morphine and related; Sulfa antibiotics; Tramadol; and Vicodin [hydrocodone-acetaminophen]   Review of Systems Review of Systems Ten systems reviewed and are negative for acute change, except as noted in the HPI.    Physical Exam Updated Vital Signs BP 121/89 (BP Location: Right Arm)   Pulse (!) 111   Temp  100.1 F (37.8 C) (Oral)   Resp (!) 23   Ht 5\' 2"  (1.575 m)   Wt 68 kg   SpO2 97%   BMI 27.44 kg/m   Physical Exam Vitals signs and nursing note reviewed.  Constitutional:      General: She is not in acute distress.    Appearance: She is well-developed. She is not ill-appearing or diaphoretic.  HENT:     Head: Normocephalic and atraumatic.  Eyes:     General: No scleral icterus.    Conjunctiva/sclera: Conjunctivae normal.  Neck:     Musculoskeletal: Normal range of motion.     Comments: Surgical scar present across the anterior lower neckline.  It appears well-healing Cardiovascular:     Rate  and Rhythm: Regular rhythm. Tachycardia present.     Heart sounds: Normal heart sounds. No murmur. No friction rub. No gallop.   Pulmonary:     Effort: Pulmonary effort is normal. No respiratory distress.     Breath sounds: Normal breath sounds.  Abdominal:     General: Bowel sounds are normal. There is no distension.     Palpations: Abdomen is soft. There is no mass.     Tenderness: There is no abdominal tenderness. There is no guarding.  Skin:    General: Skin is warm and dry.  Neurological:     Mental Status: She is alert and oriented to person, place, and time.  Psychiatric:        Behavior: Behavior normal.      ED Treatments / Results  Labs (all labs ordered are listed, but only abnormal results are displayed) Labs Reviewed - No data to display  EKG None  Radiology No results found.  Procedures Procedures (including critical care time)  Medications Ordered in ED Medications - No data to display   Initial Impression / Assessment and Plan / ED Course  I have reviewed the triage vital signs and the nursing notes.  Pertinent labs & imaging results that were available during my care of the patient were reviewed by me and considered in my medical decision making (see chart for details).  Clinical Course as of Feb 29 2353  Sat Oct 07, 2018  1745 Potassium(!): 2.8  [AH]  1745 Calcium(!): 8.5 [AH]  1746 WBC(!): 11.7 [AH]  2049 Patient Fever back up to 102.8   [AH]    Clinical Course User Index [AH] Margarita Mail, PA-C       68 year old female who is immunocompromised presents here with fever.  She is status post thyroidectomy.  Work-up shows elevated white blood cell count without elevated lactic acid.  Urine appears infected and she was treated here with Rocephin.  Initial plan was to discharge the patient with Keflex however patient is now febrile again and will need to be admitted. Final Clinical Impressions(s) / ED Diagnoses   Final diagnoses:  Acute cystitis with hematuria  Fever, unspecified fever cause  Immunocompromised [facility])    ED Discharge Orders    None       Margarita Mail, PA-C 10/08/18 0057    Malvin Johns, MD 10/08/18 1523

## 2018-10-08 ENCOUNTER — Other Ambulatory Visit: Payer: Self-pay

## 2018-10-08 DIAGNOSIS — E876 Hypokalemia: Secondary | ICD-10-CM

## 2018-10-08 DIAGNOSIS — N3001 Acute cystitis with hematuria: Secondary | ICD-10-CM | POA: Diagnosis not present

## 2018-10-08 DIAGNOSIS — M353 Polymyalgia rheumatica: Secondary | ICD-10-CM | POA: Diagnosis not present

## 2018-10-08 DIAGNOSIS — I10 Essential (primary) hypertension: Secondary | ICD-10-CM

## 2018-10-08 DIAGNOSIS — C73 Malignant neoplasm of thyroid gland: Secondary | ICD-10-CM | POA: Diagnosis not present

## 2018-10-08 DIAGNOSIS — E119 Type 2 diabetes mellitus without complications: Secondary | ICD-10-CM | POA: Diagnosis not present

## 2018-10-08 DIAGNOSIS — E785 Hyperlipidemia, unspecified: Secondary | ICD-10-CM

## 2018-10-08 DIAGNOSIS — A419 Sepsis, unspecified organism: Secondary | ICD-10-CM | POA: Diagnosis not present

## 2018-10-08 DIAGNOSIS — Z8673 Personal history of transient ischemic attack (TIA), and cerebral infarction without residual deficits: Secondary | ICD-10-CM | POA: Diagnosis not present

## 2018-10-08 LAB — GLUCOSE, CAPILLARY
Glucose-Capillary: 150 mg/dL — ABNORMAL HIGH (ref 70–99)
Glucose-Capillary: 118 mg/dL — ABNORMAL HIGH (ref 70–99)
Glucose-Capillary: 85 mg/dL (ref 70–99)
Glucose-Capillary: 193 mg/dL — ABNORMAL HIGH (ref 70–99)

## 2018-10-08 LAB — BASIC METABOLIC PANEL
Anion gap: 7 (ref 5–15)
BUN: 8 mg/dL (ref 8–23)
CO2: 20 mmol/L — ABNORMAL LOW (ref 22–32)
Calcium: 7.4 mg/dL — ABNORMAL LOW (ref 8.9–10.3)
Chloride: 111 mmol/L (ref 98–111)
Creatinine, Ser: 0.88 mg/dL (ref 0.44–1.00)
GFR calc Af Amer: >60 mL/min (ref >60)
GFR calc non Af Amer: >60 mL/min (ref >60)
Glucose, Bld: 177 mg/dL — ABNORMAL HIGH (ref 70–99)
POTASSIUM: 3.3 mmol/L — AB (ref 3.5–5.1)
Sodium: 138 mmol/L (ref 135–145)

## 2018-10-08 LAB — HIV ANTIBODY (ROUTINE TESTING W REFLEX): HIV Screen 4th Generation wRfx: NONREACTIVE

## 2018-10-08 LAB — CBC
HEMATOCRIT: 32.3 % — AB (ref 36.0–46.0)
Hemoglobin: 10.3 g/dL — ABNORMAL LOW (ref 12.0–15.0)
MCH: 26.6 pg (ref 26.0–34.0)
MCHC: 31.9 g/dL (ref 30.0–36.0)
MCV: 83.5 fL (ref 80.0–100.0)
Platelets: 224 10*3/uL (ref 150–400)
RBC: 3.87 MIL/uL (ref 3.87–5.11)
RDW: 15.2 % (ref 11.5–15.5)
WBC: 10.6 10*3/uL — ABNORMAL HIGH (ref 4.0–10.5)
nRBC: 0 % (ref 0.0–0.2)

## 2018-10-08 MED ORDER — MAGNESIUM SULFATE 2 GM/50ML IV SOLN
2.0000 g | Freq: Once | INTRAVENOUS | Status: AC
  Administered 2018-10-08: 2 g via INTRAVENOUS
  Filled 2018-10-08: qty 50

## 2018-10-08 MED ORDER — CALCIUM CARBONATE ANTACID 500 MG PO CHEW
1.0000 | CHEWABLE_TABLET | Freq: Three times a day (TID) | ORAL | Status: DC | PRN
  Filled 2018-10-08: qty 1

## 2018-10-08 MED ORDER — CALCIUM GLUCONATE-NACL 1-0.675 GM/50ML-% IV SOLN
1.0000 g | Freq: Once | INTRAVENOUS | Status: AC
  Administered 2018-10-08: 1000 mg via INTRAVENOUS
  Filled 2018-10-08: qty 50

## 2018-10-08 MED ORDER — AMLODIPINE BESYLATE 5 MG PO TABS: 5.0000 mg | ORAL_TABLET | Freq: Every evening | ORAL | Status: DC

## 2018-10-08 MED ORDER — CALCIUM CARBONATE ANTACID 500 MG PO CHEW
1.0000 | CHEWABLE_TABLET | Freq: Three times a day (TID) | ORAL | Status: DC
  Administered 2018-10-08 – 2018-10-09 (×2): 200 mg via ORAL
  Filled 2018-10-08 (×2): qty 1

## 2018-10-08 NOTE — Progress Notes (Addendum)
Progress Note    Misty Morris  UEA:[id number] DOB: 10/16/1950  DOA: 10/07/2018 PCP: Janie Morning, DO    Brief Narrative:   Chief complaint: Fever  Medical records reviewed and are as summarized below:  Misty Morris is an 68 y.o. female with past medical history of HTN, HLD, PMR, RA, DM type II, CVA /TIA, thyroid cancer status post resection; who presented with complaints of fever found to have a urinary tract infection.  Assessment/Plan:   Principal Problem:   UTI (urinary tract infection) Active Problems:   Stroke ([place])   HTN (hypertension)   DM (diabetes mellitus) ([place])   Polymyalgia rheumatica (HCC)   Hyperlipidemia   TIA (transient ischemic attack)   Rheumatoid arthritis ([place])   Papillary thyroid carcinoma (HCC)   Sepsis (HCC)   Hypokalemia   S/P total thyroidectomy  Sepsis secondary to urinary tract infection: Acute patient presented with a temperature of 100.1 F, tachypnea, and WBC elevated at 11.7.  Lactic acid was noted to be reassuring.  UA with large amount of hemoglobin and greater than 50 WBCs, moderate leukocytes, and 21-50 WBCs/HPF. Patient has been started on Rocephin and bolused 2.25 L of normal saline IV fluids.  White blood cell count appears to be trending down 10.6, and patient feeling better.  Urine culture revealed gram-negative rods. -Continue Rocephin IV, will change to p.o. antibiotic in a.m. -Follow-up urine and blood culture -Tylenol as needed  Hypokalemia and hypomagnesemia: Acute.  On admission potassium 2.8, and magnesium 1.1 patient had been given replacement potassium chloride. -Give total 4g of magnesium sulfate -Continue to monitor electrolytes and replace as needed  History of papillary thyroid cancer status post total thyroidectomy: Patient has a follow-up appointment with [facility] at 2 PM.  Essential hypertension: Blood pressures appear stable at this time.  On admission amlodipine was held. -Will restart  amlodipine tomorrow  Diabetes mellitus type 2: Patient's hemoglobin A1c is 7.5.  Patient on steroids, but reports previously diet controlled. - Hypoglycemic protocols - CBGs q. before meals and at bedtime with sensitive SSI - Adjust insulin regimen as needed  Hyperlipidemia: Patient on Crestor 20 mg at home. -Continue Crestor  Polymyalgia rheumatica, rheumatoid arthritis -Continue home prednisone dose  History of CVA/TIA -Continue Plavix  Body mass index is 27.44 kg/m.   Family Communication/Anticipated D/C date and plan/Code Status   DVT prophylaxis: Lovenox ordered. Code Status: Full Code.  Family Communication: No family present at bedside Disposition Plan: Discharge home tomorrow   Medical Consultants:    None.   Anti-Infectives:    Rocephin day 2  Subjective:   Patient reports that she is feeling better than when she came in.  She does not normally get urinary tract infections.  Patient has  appointment at [facility] for follow-up tomorrow at 2 PM that she would like to make.  Objective:    Vitals:   10/07/18 2115 10/07/18 2255 10/08/18 0756 10/08/18 1218  BP: (!) 135/96 107/75 125/76 138/78  Pulse: (!) 113 (!) 110 97 86  Resp:  18 17 16   Temp:  99.6 F (37.6 C) 98.9 F (37.2 C) 99 F (37.2 C)  TempSrc:  Oral Oral Oral  SpO2: 98% 96% 97% 98%  Weight:      Height:        Intake/Output Summary (Last 24 hours) at 10/08/2018 1450 Last data filed at 10/08/2018 0426 Gross per 24 hour  Intake 3217.77 ml  Output -  Net 3217.77  ml   Filed Weights   10/07/18 1623  Weight: 68 kg    Exam: Constitutional: NAD, calm, comfortable Eyes: PERRL, lids and conjunctivae normal ENMT: Mucous membranes are moist. Posterior pharynx clear of any exudate or lesions.Normal dentition.  Neck: normal, supple, no masses, no thyromegaly.  Surgical incision healed with no secondary signs of erythema. Respiratory: clear to auscultation bilaterally, no  wheezing, no crackles. Normal respiratory effort. No accessory muscle use.  Cardiovascular: Regular rate and rhythm, no murmurs / rubs / gallops. No extremity edema. 2+ pedal pulses. No carotid bruits.  Abdomen: no tenderness, no masses palpated. No hepatosplenomegaly. Bowel sounds positive.  No CVA tenderness. Musculoskeletal: no clubbing / cyanosis. No joint deformity upper and lower extremities. Good ROM, no contractures. Normal muscle tone.  Skin: no rashes, lesions, ulcers. No induration Neurologic: CN 2-12 grossly intact. Sensation intact, DTR normal. Strength 5/5 in all 4.  Psychiatric: Normal judgment and insight. Alert and oriented x 3. Normal mood.    Data Reviewed:   I have personally reviewed following labs and imaging studies:  Labs: Labs show the following:   Basic Metabolic Panel: Recent Labs  Lab 10/07/18 1631 10/07/18 2155 10/08/18 0404  NA 139  --  138  K 2.8*  --  3.3*  CL 106  --  111  CO2 22  --  20*  GLUCOSE 146*  --  177*  BUN 11  --  8  CREATININE 1.00  --  0.88  CALCIUM 8.5*  --  7.4*  MG  --  1.1*  --    GFR Estimated Creatinine Clearance: 56.1 mL/min (by C-G formula based on SCr of 0.88 mg/dL). Liver Function Tests: Recent Labs  Lab 10/07/18 1631  AST 14*  ALT 14  ALKPHOS 63  BILITOT 0.7  PROT 6.5  ALBUMIN 3.5   No results for input(s): LIPASE, AMYLASE in the last 168 hours. No results for input(s): AMMONIA in the last 168 hours. Coagulation profile No results for input(s): INR, PROTIME in the last 168 hours.  CBC: Recent Labs  Lab 10/07/18 1631 10/08/18 0404  WBC 11.7* 10.6*  NEUTROABS 9.4*  --   HGB 11.7* 10.3*  HCT 37.6 32.3*  MCV 83.9 83.5  PLT 252 224   Cardiac Enzymes: No results for input(s): CKTOTAL, CKMB, CKMBINDEX, TROPONINI in the last 168 hours. BNP (last 3 results) No results for input(s): PROBNP in the last 8760 hours. CBG: Recent Labs  Lab 10/08/18 0645 10/08/18 1109  GLUCAP 150* 118*   D-Dimer: No  results for input(s): DDIMER in the last 72 hours. Hgb A1c: Recent Labs    10/07/18 2156  HGBA1C 7.5*   Lipid Profile: No results for input(s): CHOL, HDL, LDLCALC, TRIG, CHOLHDL, LDLDIRECT in the last 72 hours. Thyroid function studies: Recent Labs    10/07/18 1718  TSH 0.771   Anemia work up: No results for input(s): VITAMINB12, FOLATE, FERRITIN, TIBC, IRON, RETICCTPCT in the last 72 hours. Sepsis Labs: Recent Labs  Lab 10/07/18 1631 10/07/18 1718 10/08/18 0404  WBC 11.7*  --  10.6*  LATICACIDVEN  --  1.0  --     Microbiology Recent Results (from the past 240 hour(s))  Urine Culture     Status: Abnormal (Preliminary result)   Collection Time: 10/07/18  6:51 PM  Result Value Ref Range Status   Specimen Description URINE, CLEAN CATCH  Final   Special Requests   Final    NONE Performed at [facility], [place]  [address], [place], [place] [address]    Culture >=100,000 COLONIES/mL GRAM NEGATIVE RODS (A)  Final   Report Status PENDING  Incomplete    Procedures and diagnostic studies:  Dg Chest 2 View  Result Date: 10/07/2018 CLINICAL DATA:  Fever for 2 days. EXAM: CHEST - 2 VIEW COMPARISON:  01/13/2012 FINDINGS: The cardiomediastinal silhouette is unremarkable. Surgical clips overlying the LOWER neck noted. There is no evidence of focal airspace disease, pulmonary edema, suspicious pulmonary nodule/mass, pleural effusion, or pneumothorax. No acute bony abnormalities are identified. IMPRESSION: No active cardiopulmonary disease. Electronically Signed   By: Margarette Canada M.D.   On: 10/07/2018 18:15    Medications:   . calcium carbonate  1 tablet Oral TID WC  . clopidogrel  75 mg Oral Daily  . diphenhydrAMINE  25 mg Oral QHS  . enoxaparin (LOVENOX) injection  40 mg Subcutaneous Daily  . insulin aspart  0-9 Units Subcutaneous TID WC  . latanoprost  1 drop Both Eyes QHS  . levothyroxine  137 mcg Oral Daily  . predniSONE  8 mg Oral QAC supper  . rosuvastatin  20 mg  Oral q1800  . timolol  1 drop Both Eyes Daily   Continuous Infusions: . cefTRIAXone (ROCEPHIN)  IV       LOS: 0 days   Misty Morris  [facility]   *Please refer to [email or web address], password [email or web address] to get updated schedule on who will round on this patient, as hospitalists switch teams weekly. If 7PM-7AM, please contact night-coverage at [email or web address], password [email or web address] for any overnight needs.

## 2018-10-08 NOTE — Care Management Obs Status (Signed)
[facility] NOTIFICATION   Patient Details  Name: Misty Morris MRN: [id number] Date of Birth: 1950/10/06   Medicare Observation Status Notification Given:  Yes    Dawayne Patricia, RN 10/08/2018, 4:46 PM

## 2018-10-09 DIAGNOSIS — E876 Hypokalemia: Secondary | ICD-10-CM | POA: Diagnosis not present

## 2018-10-09 DIAGNOSIS — Z79899 Other long term (current) drug therapy: Secondary | ICD-10-CM | POA: Diagnosis not present

## 2018-10-09 DIAGNOSIS — E890 Postprocedural hypothyroidism: Secondary | ICD-10-CM

## 2018-10-09 DIAGNOSIS — C73 Malignant neoplasm of thyroid gland: Secondary | ICD-10-CM | POA: Diagnosis not present

## 2018-10-09 DIAGNOSIS — E1169 Type 2 diabetes mellitus with other specified complication: Secondary | ICD-10-CM

## 2018-10-09 DIAGNOSIS — Z9089 Acquired absence of other organs: Secondary | ICD-10-CM | POA: Diagnosis not present

## 2018-10-09 DIAGNOSIS — N3001 Acute cystitis with hematuria: Secondary | ICD-10-CM | POA: Diagnosis not present

## 2018-10-09 DIAGNOSIS — D899 Disorder involving the immune mechanism, unspecified: Secondary | ICD-10-CM | POA: Diagnosis not present

## 2018-10-09 DIAGNOSIS — I10 Essential (primary) hypertension: Secondary | ICD-10-CM | POA: Diagnosis not present

## 2018-10-09 DIAGNOSIS — M069 Rheumatoid arthritis, unspecified: Secondary | ICD-10-CM | POA: Diagnosis not present

## 2018-10-09 DIAGNOSIS — E785 Hyperlipidemia, unspecified: Secondary | ICD-10-CM | POA: Diagnosis not present

## 2018-10-09 DIAGNOSIS — A419 Sepsis, unspecified organism: Secondary | ICD-10-CM | POA: Diagnosis not present

## 2018-10-09 DIAGNOSIS — E892 Postprocedural hypoparathyroidism: Secondary | ICD-10-CM | POA: Diagnosis not present

## 2018-10-09 LAB — BASIC METABOLIC PANEL
Anion gap: 9 (ref 5–15)
BUN: 6 mg/dL — ABNORMAL LOW (ref 8–23)
CO2: 23 mmol/L (ref 22–32)
Calcium: 7.5 mg/dL — ABNORMAL LOW (ref 8.9–10.3)
Chloride: 108 mmol/L (ref 98–111)
Creatinine, Ser: 0.81 mg/dL (ref 0.44–1.00)
GFR calc Af Amer: >60 mL/min (ref >60)
GFR calc non Af Amer: >60 mL/min (ref >60)
Glucose, Bld: 191 mg/dL — ABNORMAL HIGH (ref 70–99)
Potassium: 3.5 mmol/L (ref 3.5–5.1)
SODIUM: 140 mmol/L (ref 135–145)

## 2018-10-09 LAB — URINE CULTURE: Culture: >=100000 — AB

## 2018-10-09 LAB — CBC WITH DIFFERENTIAL/PLATELET
ABS IMMATURE GRANULOCYTES: 0.02 10*3/uL (ref 0.00–0.07)
BASOS PCT: 0 %
Basophils Absolute: 0 10*3/uL (ref 0.0–0.1)
Eosinophils Absolute: 0 10*3/uL (ref 0.0–0.5)
Eosinophils Relative: 0 %
HCT: 35.7 % — ABNORMAL LOW (ref 36.0–46.0)
Hemoglobin: 11.3 g/dL — ABNORMAL LOW (ref 12.0–15.0)
Immature Granulocytes: 0 %
Lymphocytes Relative: 10 %
Lymphs Abs: 0.7 10*3/uL (ref 0.7–4.0)
MCH: 26.4 pg (ref 26.0–34.0)
MCHC: 31.7 g/dL (ref 30.0–36.0)
MCV: 83.4 fL (ref 80.0–100.0)
Monocytes Absolute: 0.4 10*3/uL (ref 0.1–1.0)
Monocytes Relative: 5 %
Neutro Abs: 6 10*3/uL (ref 1.7–7.7)
Neutrophils Relative %: 85 %
PLATELETS: 256 10*3/uL (ref 150–400)
RBC: 4.28 MIL/uL (ref 3.87–5.11)
RDW: 15 % (ref 11.5–15.5)
WBC: 7.1 10*3/uL (ref 4.0–10.5)
nRBC: 0 % (ref 0.0–0.2)

## 2018-10-09 LAB — MAGNESIUM: Magnesium: 2 mg/dL (ref 1.7–2.4)

## 2018-10-09 LAB — GLUCOSE, CAPILLARY: Glucose-Capillary: 180 mg/dL — ABNORMAL HIGH (ref 70–99)

## 2018-10-09 MED ORDER — CEFDINIR 300 MG PO CAPS: 300.0000 mg | ORAL_CAPSULE | Freq: Two times a day (BID) | ORAL | Status: DC

## 2018-10-09 MED ORDER — CEFDINIR 300 MG PO CAPS
300.0000 mg | ORAL_CAPSULE | Freq: Two times a day (BID) | ORAL | Status: DC
  Administered 2018-10-09: 300 mg via ORAL
  Filled 2018-10-09: qty 1

## 2018-10-09 MED ORDER — CEFDINIR 300 MG PO CAPS: 300.0000 mg | ORAL_CAPSULE | Freq: Two times a day (BID) | ORAL | 0 refills | Status: DC

## 2018-10-10 NOTE — Discharge Summary (Signed)
Misty Morris, is a 68 y.o. female  DOB 18-Nov-1950  MRN [id number].  Admission date:  10/07/2018  Admitting Physician  Shela Leff, MD  Discharge Date:  10/09/2018   Primary MD  Janie Morning, DO  Recommendations for primary care physician for things to follow:   Diabetes management   Discharge Diagnosis   Principal Problem:   UTI (urinary tract infection) Active Problems:   Stroke ([place])   HTN (hypertension)   DM (diabetes mellitus) ([place])   Polymyalgia rheumatica ([place])   Hyperlipidemia   TIA (transient ischemic attack)   Rheumatoid arthritis ([place])   Papillary thyroid carcinoma ([place])   Sepsis ([place])   Hypokalemia   S/P total thyroidectomy      Past Medical History:  Diagnosis Date  . Fibromyalgia   . GERD (gastroesophageal reflux disease)   . Hypercholesterolemia   . Hypertension   . Polymyalgia rheumatica ([place])   . Rheumatoid arthritis ([place])    "atypical/MD 08/2017" (02/14/2018)  . Shingles   . Stroke ([place]) 08/2017   "no residual" (02/14/2018)  . TIA (transient ischemic attack) 02/14/2018  . Type 2 diabetes, diet controlled ([place])   . Vision impairment    right eye    Past Surgical History:  Procedure Laterality Date  . CATARACT EXTRACTION W/ INTRAOCULAR LENS  IMPLANT, BILATERAL    . JOINT REPLACEMENT    . OPEN REDUCTION LE FORT I FRACTURE  1980s  . PARTIAL KNEE ARTHROPLASTY Left 01/17/2017   Procedure: UNICOMPARTMENTAL LEFT KNEE;  Surgeon: Paralee Cancel, MD;  Location: [facility];  Service: Orthopedics;  Laterality: Left;  . RADIOLOGY WITH ANESTHESIA N/A 08/11/2017   Procedure: MRI OF BRAIN WITH AND WITHOUT CONSTRAST, AND OF THE ORBIT WITH AND WITHOUT CONTRAST;  Surgeon: Radiologist, Medication, MD;  Location: [facility];  Service: Radiology;  Laterality: N/A;  . RADIOLOGY WITH ANESTHESIA N/A 09/06/2017   Procedure: MRI OF BRAIN;  Surgeon: Radiologist, Medication, MD;  Location: [facility];  Service: Radiology;  Laterality: N/A;  . RHINOPLASTY    . TOTAL KNEE ARTHROPLASTY Right 09/30/2015   Procedure: TOTAL RIGHT KNEE ARTHROPLASTY;  Surgeon: Paralee Cancel, MD;  Location: [facility];  Service: Orthopedics;  Laterality: Right;  Marland Kitchen VAGINAL HYSTERECTOMY         HPI  from the history and physical done on the day of admission:   Misty Morris is a 68 y.o. female with medical history significant of hypertension, hyperlipidemia, polymyalgia rheumatica, rheumatoid arthritis, type 2 diabetes, history of left PCA CVA 08/2017 and subsequent TIA 02/2018 on Plavix, GERD presenting to the hospital for evaluation of fever.  Patient was admitted to [facility] on September 19, 2017.  She underwent total thyroidectomy for papillary thyroid cancer.  Her hospital course was complicated by postop hypocalcemia.  She underwent left neck reexploration on 2/14 due to residual left neck lymph node found on postoperative CT imaging.  Her calcium improved during this hospitalization and patient was discharged on 2/17.    Patient states she was doing well since her recent  surgery until she spiked a fever of 102.3 F at home yesterday.  She called her surgeon's office and was instructed to take Tylenol and monitor for fever recurrence.  She continued to have fevers and generalized fatigue despite taking Tylenol.  Denies noticing any swelling or drainage from her surgical incision site.  States she was seen by her surgeon 3 days ago and was told her incision site looked good.  No difficulty swallowing.  Denies having any rhinorrhea, sore throat, shortness of breath, cough, or chest pain.  Denies having any abdominal pain, nausea, or vomiting.  Denies having any suprapubic pain, dysuria, urinary frequency, or urgency.        Hospital Course:   1.  Sepsis present on admission secondary to urinary tract infection: Acute patient presented  with a temperature of 100.1 F, tachypnea, and WBC elevated at 11.7.  Lactic acid was noted to be reassuring.  UA with large amount of hemoglobin and greater than 50 WBCs, moderate leukocytes, and 21-50 WBCs/HPF. Patient has been started on Rocephin and bolused 2.25 L of normal saline IV fluids.  White blood cell count appears to be trending down 10.6, and patient feeling better.  Urine culture ultimately grew pansensitive E. coli.  IV Rocephin was transitioned to p.o. Omnicef.  Patient was discharged home to complete antibiotic course.   2.  Hypokalemia and hypomagnesemia: Resolved.  On admission potassium 2.8, and magnesium 1.1.  Electrolytes were replaced and noted to be within normal limits prior to discharge.  3.  History of papillary thyroid cancer status post total thyroidectomy: Surgery occurred approximately 3 weeks ago. Patient has a follow-up appointment with [facility] at 2 PM.  4.  Essential hypertension: Blood pressures appear stable at this time.  On admission amlodipine was held, but restarted prior to discharge  5.  Diabetes mellitus type 2: Patient's hemoglobin A1c is 7.5.    Currently taking steroids, but reports diabetes was previously diet controlled.  While in the hospital patient was placed on sliding scale insulin with hypoglycemic protocols.  She has a follow-up appointment with endocrinology 2 PM.  6.  Hyperlipidemia: Patient on Crestor 20 mg at home which was continued.  7.  Polymyalgia rheumatica, rheumatoid arthritis, and immunocompromised: Stable.  Continue home prednisone dose  8.  Normocytic normochromic anemia: Patient reported no complaints of bleeding.  Hemoglobin 11.3 prior to discharge which appears near patient's baseline.  9.  History of CVA/TIA: Patient on home dose of Plavix     Follow UP  Follow-up Information    Janie Morning, DO. Schedule an appointment as soon as possible for a visit.   Specialty:  Family Medicine Why:   Please make appointment within 1 week for follow-up since hospital discharge. Contact information: [address] [place] [address] [phone]            Consults obtained: None  Discharge Condition: Stable  Diet and Activity recommendation: See Discharge Instructions below  Discharge Instructions    Discharge instructions   Complete by:  As directed    Follow with Primary MD Janie Morning, DO within 7-10 days from hospital discharge.  Your diabetes appears to be uncontrolled at this time and your hemoglobin A1c was noted to be 7.5.  Your diabetes being uncontrolled may have likely put you at increased risk to develop a urinary tract infection.  Please keep previously scheduled follow-up appointment with endocrinology today.  Get CBC and BMP-  checked  by Primary MDin 5-7  days ( we routinely change or add medications that can affect your baseline labs and fluid status, therefore we recommend that you get the mentioned basic workup next visit with your PCP, your PCP may decide not to get them or add new tests based on their clinical decision)  Activity: As tolerated  Disposition: Home   Diet: Carbohydrate modified diet  Special Instructions: If you have smoked or chewed Tobacco  in the last 2 yrs please stop smoking, stop any regular Alcohol  and or any Recreational drug use.  On your next visit with your primary care physician please Get Medicines reviewed and adjusted.  Please request your Janie Morning, DO to go over all Hospital Tests and Procedure/Radiological results at the follow up, please get all Hospital records sent to your Prim MD by signing hospital release before you go home.  If you experience worsening of your admission symptoms, develop shortness of breath, life threatening emergency, suicidal or homicidal thoughts you must seek medical attention immediately by calling 911 or calling your MD immediately  if symptoms less severe.  You Must  read complete instructions/literature along with all the possible adverse reactions/side effects for all the Medicines you take and that have been prescribed to you. Take any new Medicines after you have completely understood and accpet all the possible adverse reactions/side effects.   Do not drive, operate heavy machinery, perform activities at heights, swimming or participation in water activities or provide baby sitting services if your were admitted for syncope or siezures until you have seen by Primary MD or a Neurologist and advised to do so again.  Do not drive when taking Pain medications.  Do not take more than prescribed Pain, Sleep and Anxiety Medications  Wear Seat belts while driving.   Please note  You were cared for by a hospitalist during your hospital stay. If you have any questions about your discharge medications or the care you received while you were in the hospital after you are discharged, you can call the unit and asked to speak with the hospitalist on call if the hospitalist that took care of you is not available. Once you are discharged, your primary care physician will handle any further medical issues. Please note that NO REFILLS for any discharge medications will be authorized once you are discharged, as it is imperative that you return to your primary care physician (or establish a relationship with a primary care physician if you do not have one) for your aftercare needs so that they can reassess your need for medications and monitor your lab values.        Discharge Medications     Allergies as of 10/09/2018      Reactions   Codeine Hives   Demerol [meperidine] Hives   Latex Dermatitis, Other (See Comments)   Blistering   Morphine And Related Hives   Sulfa Antibiotics Hives   Tramadol Hives   Vicodin [hydrocodone-acetaminophen] Itching   Elevated blood pressure. Tolerates low doses.      Medication List    TAKE these medications   Acetylcarn-Alpha  Lipoic Acid 400-200 MG Caps Take 1 capsule by mouth 2 (two) times daily.   amLODipine 5 MG tablet Commonly known as:  NORVASC Take 5 mg by mouth every evening.   calcium carbonate 500 MG chewable tablet Commonly known as:  TUMS - dosed in mg elemental calcium Chew 1 tablet by mouth 2 (two) times daily.   cefdinir 300 MG capsule Commonly known as:  OMNICEF Take 1 capsule (300 mg total) by mouth every 12 (twelve) hours.   Chromium Picolinate 200 MCG Caps Take 200 mcg by mouth daily.   clopidogrel 75 MG tablet Commonly known as:  PLAVIX Take 1 tablet (75 mg total) by mouth daily.   CoQ-10 100 MG Caps Take 100 mg by mouth daily.   diphenhydrAMINE 25 MG tablet Commonly known as:  BENADRYL Take 25 mg by mouth at bedtime.   levothyroxine 137 MCG tablet Commonly known as:  SYNTHROID, LEVOTHROID Take 137 mcg by mouth daily.   Lysine 500 MG Caps Take 500 mg by mouth every evening.   predniSONE 1 MG Tbec Take 3 mg by mouth See admin instructions. Take 3 tablets 1mg   and 1 tablet 5 mg to equal  8 mg totally  by mouth every evening   predniSONE 5 MG tablet Commonly known as:  DELTASONE Take 5 mg by mouth See admin instructions. Take 3 tablets 1mg   and 1 tablet 5 mg to equal  8 mg totally  by mouth every evening   rosuvastatin 20 MG tablet Commonly known as:  CRESTOR Take 1 tablet (20 mg total) by mouth daily at 6 PM.   TIMOPTIC OCUDOSE 0.5 % Soln Generic drug:  Timolol Maleate PF Place 1 drop into both eyes daily.   ZIOPTAN 0.0015 % Soln Generic drug:  Tafluprost (PF) Place 1 drop into both eyes at bedtime.       Major procedures and Radiology Reports - PLEASE review detailed and final reports for all details, in brief -      Dg Chest 2 View  Result Date: 10/07/2018 CLINICAL DATA:  Fever for 2 days. EXAM: CHEST - 2 VIEW COMPARISON:  01/13/2012 FINDINGS: The cardiomediastinal silhouette is unremarkable. Surgical clips overlying the LOWER neck noted. There is no  evidence of focal airspace disease, pulmonary edema, suspicious pulmonary nodule/mass, pleural effusion, or pneumothorax. No acute bony abnormalities are identified. IMPRESSION: No active cardiopulmonary disease. Electronically Signed   By: Margarette Canada M.D.   On: 10/07/2018 18:15    Micro Results    Recent Results (from the past 240 hour(s))  Blood Culture (routine x 2)     Status: None (Preliminary result)   Collection Time: 10/07/18  5:18 PM  Result Value Ref Range Status   Specimen Description BLOOD LEFT FOREARM  Final   Special Requests   Final    BOTTLES DRAWN AEROBIC AND ANAEROBIC Blood Culture adequate volume   Culture NO GROWTH 2 DAYS  Final   Report Status PENDING  Incomplete  Blood Culture (routine x 2)     Status: None (Preliminary result)   Collection Time: 10/07/18  5:29 PM  Result Value Ref Range Status   Specimen Description BLOOD RIGHT FOREARM  Final   Special Requests   Final    BOTTLES DRAWN AEROBIC AND ANAEROBIC Blood Culture results may not be optimal due to an inadequate volume of blood received in culture bottles   Culture NO GROWTH 2 DAYS  Final   Report Status PENDING  Incomplete  Urine Culture     Status: Abnormal   Collection Time: 10/07/18  6:51 PM  Result Value Ref Range Status   Specimen Description URINE, CLEAN CATCH  Final   Special Requests   Final    NONE Performed at [facility], [place] [address], [place],  [address]    Culture >=100,000 COLONIES/mL ESCHERICHIA COLI (A)  Final   Report Status 10/09/2018 FINAL  Final  Organism ID, Bacteria ESCHERICHIA COLI (A)  Final      Susceptibility   Escherichia coli - MIC*    AMPICILLIN <=2 SENSITIVE Sensitive     CEFAZOLIN <=4 SENSITIVE Sensitive     CEFTRIAXONE <=1 SENSITIVE Sensitive     CIPROFLOXACIN <=0.25 SENSITIVE Sensitive     GENTAMICIN <=1 SENSITIVE Sensitive     IMIPENEM <=0.25 SENSITIVE Sensitive     NITROFURANTOIN <=16 SENSITIVE Sensitive     TRIMETH/SULFA <=20 SENSITIVE  Sensitive     AMPICILLIN/SULBACTAM <=2 SENSITIVE Sensitive     PIP/TAZO <=4 SENSITIVE Sensitive     Extended ESBL NEGATIVE Sensitive     * >=100,000 COLONIES/mL ESCHERICHIA COLI       Today   Subjective    Misty Morris today that she feels much better and is ready to be discharged home.   Objective   Blood pressure (!) 154/89, pulse 99, temperature 98.4 F (36.9 C), temperature source Oral, resp. rate 20, height 5\' 2"  (1.575 m), weight 68 kg, SpO2 98 %.  No intake or output data in the 24 hours ending 10/10/18 0827  Exam  Constitutional: NAD, calm, comfortable Eyes: PERRL, lids and conjunctivae normal ENMT: Mucous membranes are moist. Posterior pharynx clear of any exudate or lesions.  Neck: normal, supple, no masses, no thyromegaly.  Healed postsurgical scar. Respiratory: clear to auscultation bilaterally, no wheezing, no crackles. Normal respiratory effort. No accessory muscle use.  Cardiovascular: Regular rate and rhythm, no murmurs / rubs / gallops. No extremity edema. 2+ pedal pulses. No carotid bruits.  Abdomen: no tenderness, no masses palpated. No hepatosplenomegaly. Bowel sounds positive.  Musculoskeletal: no clubbing / cyanosis. No joint deformity upper and lower extremities. Good ROM, no contractures. Normal muscle tone.  Skin: no rashes. No induration Neurologic: CN 2-12 grossly intact. Sensation intact, DTR normal. Strength 5/5 in all 4.  Psychiatric: Normal judgment and insight. Alert and oriented x 3. Normal mood.    Data Review   CBC w Diff:  Lab Results  Component Value Date   WBC 7.1 10/09/2018   HGB 11.3 (L) 10/09/2018   HCT 35.7 (L) 10/09/2018   PLT 256 10/09/2018   LYMPHOPCT 10 10/09/2018   MONOPCT 5 10/09/2018   EOSPCT 0 10/09/2018   BASOPCT 0 10/09/2018    CMP:  Lab Results  Component Value Date   NA 140 10/09/2018   K 3.5 10/09/2018   CL 108 10/09/2018   CO2 23 10/09/2018   BUN 6 (L) 10/09/2018   CREATININE 0.81 10/09/2018   PROT  6.5 10/07/2018   ALBUMIN 3.5 10/07/2018   BILITOT 0.7 10/07/2018   ALKPHOS 63 10/07/2018   AST 14 (L) 10/07/2018   ALT 14 10/07/2018  .   Total Time in preparing paper work, data evaluation and todays exam - 35 minutes  Norval Morton M.D on 10/09/2018 at 8:27 AM  [facility]   Office  [phone]

## 2018-10-12 LAB — CULTURE, BLOOD (ROUTINE X 2)
CULTURE: NO GROWTH
Culture: NO GROWTH
Special Requests: ADEQUATE

## 2018-10-16 DIAGNOSIS — C73 Malignant neoplasm of thyroid gland: Secondary | ICD-10-CM | POA: Diagnosis not present

## 2018-10-17 DIAGNOSIS — R300 Dysuria: Secondary | ICD-10-CM | POA: Diagnosis not present

## 2018-10-17 DIAGNOSIS — C73 Malignant neoplasm of thyroid gland: Secondary | ICD-10-CM | POA: Diagnosis not present

## 2018-10-17 DIAGNOSIS — N390 Urinary tract infection, site not specified: Secondary | ICD-10-CM | POA: Diagnosis not present

## 2018-11-08 DIAGNOSIS — M1712 Unilateral primary osteoarthritis, left knee: Secondary | ICD-10-CM | POA: Diagnosis not present

## 2018-11-08 DIAGNOSIS — T148XXA Other injury of unspecified body region, initial encounter: Secondary | ICD-10-CM | POA: Diagnosis not present

## 2018-11-08 DIAGNOSIS — I10 Essential (primary) hypertension: Secondary | ICD-10-CM | POA: Diagnosis not present

## 2018-11-08 DIAGNOSIS — Z23 Encounter for immunization: Secondary | ICD-10-CM | POA: Diagnosis not present

## 2018-11-08 DIAGNOSIS — R9431 Abnormal electrocardiogram [ECG] [EKG]: Secondary | ICD-10-CM | POA: Diagnosis not present

## 2018-11-08 DIAGNOSIS — Z01818 Encounter for other preprocedural examination: Secondary | ICD-10-CM | POA: Diagnosis not present

## 2018-11-08 DIAGNOSIS — E119 Type 2 diabetes mellitus without complications: Secondary | ICD-10-CM | POA: Diagnosis not present

## 2018-11-08 DIAGNOSIS — C73 Malignant neoplasm of thyroid gland: Secondary | ICD-10-CM | POA: Diagnosis not present

## 2018-11-09 DIAGNOSIS — I10 Essential (primary) hypertension: Secondary | ICD-10-CM | POA: Diagnosis not present

## 2018-11-09 DIAGNOSIS — Z0000 Encounter for general adult medical examination without abnormal findings: Secondary | ICD-10-CM | POA: Diagnosis not present

## 2018-11-09 DIAGNOSIS — R269 Unspecified abnormalities of gait and mobility: Secondary | ICD-10-CM | POA: Diagnosis not present

## 2018-11-09 DIAGNOSIS — Z79899 Other long term (current) drug therapy: Secondary | ICD-10-CM | POA: Diagnosis not present

## 2018-11-09 DIAGNOSIS — I4510 Unspecified right bundle-branch block: Secondary | ICD-10-CM | POA: Diagnosis not present

## 2018-11-09 DIAGNOSIS — C73 Malignant neoplasm of thyroid gland: Secondary | ICD-10-CM | POA: Diagnosis not present

## 2018-11-09 DIAGNOSIS — Z1389 Encounter for screening for other disorder: Secondary | ICD-10-CM | POA: Diagnosis not present

## 2018-11-09 DIAGNOSIS — Z23 Encounter for immunization: Secondary | ICD-10-CM | POA: Diagnosis not present

## 2018-11-10 DIAGNOSIS — J90 Pleural effusion, not elsewhere classified: Secondary | ICD-10-CM | POA: Diagnosis not present

## 2018-11-10 DIAGNOSIS — Z87891 Personal history of nicotine dependence: Secondary | ICD-10-CM | POA: Diagnosis not present

## 2018-11-10 DIAGNOSIS — H43812 Vitreous degeneration, left eye: Secondary | ICD-10-CM | POA: Diagnosis not present

## 2018-11-10 DIAGNOSIS — K219 Gastro-esophageal reflux disease without esophagitis: Secondary | ICD-10-CM | POA: Diagnosis not present

## 2018-11-10 DIAGNOSIS — R0789 Other chest pain: Secondary | ICD-10-CM | POA: Diagnosis not present

## 2018-11-10 DIAGNOSIS — Z1331 Encounter for screening for depression: Secondary | ICD-10-CM | POA: Diagnosis not present

## 2018-11-10 DIAGNOSIS — Z86010 Personal history of colonic polyps: Secondary | ICD-10-CM | POA: Diagnosis not present

## 2018-11-10 DIAGNOSIS — G931 Anoxic brain damage, not elsewhere classified: Secondary | ICD-10-CM | POA: Diagnosis not present

## 2018-11-10 DIAGNOSIS — Z79899 Other long term (current) drug therapy: Secondary | ICD-10-CM | POA: Diagnosis not present

## 2018-11-10 DIAGNOSIS — I5043 Acute on chronic combined systolic (congestive) and diastolic (congestive) heart failure: Secondary | ICD-10-CM | POA: Diagnosis not present

## 2018-11-10 DIAGNOSIS — G63 Polyneuropathy in diseases classified elsewhere: Secondary | ICD-10-CM | POA: Diagnosis not present

## 2018-11-10 DIAGNOSIS — Z01818 Encounter for other preprocedural examination: Secondary | ICD-10-CM | POA: Diagnosis not present

## 2018-11-10 DIAGNOSIS — Z933 Colostomy status: Secondary | ICD-10-CM | POA: Diagnosis not present

## 2018-11-10 DIAGNOSIS — M545 Low back pain: Secondary | ICD-10-CM | POA: Diagnosis not present

## 2018-11-10 DIAGNOSIS — I2510 Atherosclerotic heart disease of native coronary artery without angina pectoris: Secondary | ICD-10-CM | POA: Diagnosis not present

## 2018-11-10 DIAGNOSIS — C73 Malignant neoplasm of thyroid gland: Secondary | ICD-10-CM | POA: Diagnosis not present

## 2018-11-10 DIAGNOSIS — H25813 Combined forms of age-related cataract, bilateral: Secondary | ICD-10-CM | POA: Diagnosis not present

## 2018-11-10 DIAGNOSIS — I700 Atherosclerosis of aorta: Secondary | ICD-10-CM | POA: Diagnosis not present

## 2018-11-10 DIAGNOSIS — E1165 Type 2 diabetes mellitus with hyperglycemia: Secondary | ICD-10-CM | POA: Diagnosis not present

## 2018-11-10 DIAGNOSIS — E042 Nontoxic multinodular goiter: Secondary | ICD-10-CM | POA: Diagnosis not present

## 2018-11-10 DIAGNOSIS — I10 Essential (primary) hypertension: Secondary | ICD-10-CM | POA: Diagnosis not present

## 2018-11-10 DIAGNOSIS — S79911A Unspecified injury of right hip, initial encounter: Secondary | ICD-10-CM | POA: Diagnosis not present

## 2018-11-10 DIAGNOSIS — M25551 Pain in right hip: Secondary | ICD-10-CM | POA: Diagnosis not present

## 2018-11-17 DIAGNOSIS — C73 Malignant neoplasm of thyroid gland: Secondary | ICD-10-CM | POA: Diagnosis not present

## 2018-11-17 DIAGNOSIS — Z23 Encounter for immunization: Secondary | ICD-10-CM | POA: Diagnosis not present

## 2018-12-25 DIAGNOSIS — G902 Horner's syndrome: Secondary | ICD-10-CM | POA: Diagnosis not present

## 2018-12-25 DIAGNOSIS — H401132 Primary open-angle glaucoma, bilateral, moderate stage: Secondary | ICD-10-CM | POA: Diagnosis not present

## 2018-12-27 DIAGNOSIS — C73 Malignant neoplasm of thyroid gland: Secondary | ICD-10-CM | POA: Diagnosis not present

## 2019-01-09 DIAGNOSIS — M797 Fibromyalgia: Secondary | ICD-10-CM | POA: Diagnosis not present

## 2019-01-09 DIAGNOSIS — M8580 Other specified disorders of bone density and structure, unspecified site: Secondary | ICD-10-CM | POA: Diagnosis not present

## 2019-01-09 DIAGNOSIS — Z79899 Other long term (current) drug therapy: Secondary | ICD-10-CM | POA: Diagnosis not present

## 2019-01-09 DIAGNOSIS — M79644 Pain in right finger(s): Secondary | ICD-10-CM | POA: Diagnosis not present

## 2019-01-09 DIAGNOSIS — M353 Polymyalgia rheumatica: Secondary | ICD-10-CM | POA: Diagnosis not present

## 2019-01-09 DIAGNOSIS — E1169 Type 2 diabetes mellitus with other specified complication: Secondary | ICD-10-CM | POA: Diagnosis not present

## 2019-01-09 DIAGNOSIS — M0579 Rheumatoid arthritis with rheumatoid factor of multiple sites without organ or systems involvement: Secondary | ICD-10-CM | POA: Diagnosis not present

## 2019-01-09 DIAGNOSIS — M1990 Unspecified osteoarthritis, unspecified site: Secondary | ICD-10-CM | POA: Diagnosis not present

## 2019-01-09 DIAGNOSIS — M25519 Pain in unspecified shoulder: Secondary | ICD-10-CM | POA: Diagnosis not present

## 2019-01-09 DIAGNOSIS — E785 Hyperlipidemia, unspecified: Secondary | ICD-10-CM | POA: Diagnosis not present

## 2019-01-09 DIAGNOSIS — C73 Malignant neoplasm of thyroid gland: Secondary | ICD-10-CM | POA: Diagnosis not present

## 2019-01-15 DIAGNOSIS — E892 Postprocedural hypoparathyroidism: Secondary | ICD-10-CM | POA: Diagnosis not present

## 2019-01-15 DIAGNOSIS — C73 Malignant neoplasm of thyroid gland: Secondary | ICD-10-CM | POA: Diagnosis not present

## 2019-01-15 DIAGNOSIS — E890 Postprocedural hypothyroidism: Secondary | ICD-10-CM | POA: Diagnosis not present

## 2019-01-19 DIAGNOSIS — M353 Polymyalgia rheumatica: Secondary | ICD-10-CM | POA: Diagnosis not present

## 2019-01-19 DIAGNOSIS — M1990 Unspecified osteoarthritis, unspecified site: Secondary | ICD-10-CM | POA: Diagnosis not present

## 2019-01-19 DIAGNOSIS — E119 Type 2 diabetes mellitus without complications: Secondary | ICD-10-CM | POA: Diagnosis not present

## 2019-01-19 DIAGNOSIS — I10 Essential (primary) hypertension: Secondary | ICD-10-CM | POA: Diagnosis not present

## 2019-01-19 DIAGNOSIS — M810 Age-related osteoporosis without current pathological fracture: Secondary | ICD-10-CM | POA: Diagnosis not present

## 2019-01-19 DIAGNOSIS — E785 Hyperlipidemia, unspecified: Secondary | ICD-10-CM | POA: Diagnosis not present

## 2019-01-22 DIAGNOSIS — E890 Postprocedural hypothyroidism: Secondary | ICD-10-CM | POA: Diagnosis not present

## 2019-01-22 DIAGNOSIS — K449 Diaphragmatic hernia without obstruction or gangrene: Secondary | ICD-10-CM | POA: Diagnosis not present

## 2019-01-22 DIAGNOSIS — I700 Atherosclerosis of aorta: Secondary | ICD-10-CM | POA: Diagnosis not present

## 2019-01-22 DIAGNOSIS — I2510 Atherosclerotic heart disease of native coronary artery without angina pectoris: Secondary | ICD-10-CM | POA: Diagnosis not present

## 2019-01-22 DIAGNOSIS — N281 Cyst of kidney, acquired: Secondary | ICD-10-CM | POA: Diagnosis not present

## 2019-01-22 DIAGNOSIS — D1809 Hemangioma of other sites: Secondary | ICD-10-CM | POA: Diagnosis not present

## 2019-01-22 DIAGNOSIS — C73 Malignant neoplasm of thyroid gland: Secondary | ICD-10-CM | POA: Diagnosis not present

## 2019-01-25 ENCOUNTER — Ambulatory Visit: Payer: PPO | Admitting: Adult Health

## 2019-01-25 DIAGNOSIS — H16122 Filamentary keratitis, left eye: Secondary | ICD-10-CM | POA: Diagnosis not present

## 2019-01-29 DIAGNOSIS — Z8673 Personal history of transient ischemic attack (TIA), and cerebral infarction without residual deficits: Secondary | ICD-10-CM | POA: Diagnosis not present

## 2019-01-29 DIAGNOSIS — E785 Hyperlipidemia, unspecified: Secondary | ICD-10-CM | POA: Diagnosis not present

## 2019-01-29 DIAGNOSIS — Z7189 Other specified counseling: Secondary | ICD-10-CM | POA: Diagnosis not present

## 2019-01-29 DIAGNOSIS — E119 Type 2 diabetes mellitus without complications: Secondary | ICD-10-CM | POA: Diagnosis not present

## 2019-01-29 DIAGNOSIS — C73 Malignant neoplasm of thyroid gland: Secondary | ICD-10-CM | POA: Diagnosis not present

## 2019-01-29 DIAGNOSIS — I10 Essential (primary) hypertension: Secondary | ICD-10-CM | POA: Diagnosis not present

## 2019-02-01 DIAGNOSIS — Z961 Presence of intraocular lens: Secondary | ICD-10-CM | POA: Diagnosis not present

## 2019-02-01 DIAGNOSIS — H26493 Other secondary cataract, bilateral: Secondary | ICD-10-CM | POA: Diagnosis not present

## 2019-02-20 DIAGNOSIS — H26492 Other secondary cataract, left eye: Secondary | ICD-10-CM | POA: Diagnosis not present

## 2019-02-21 DIAGNOSIS — N390 Urinary tract infection, site not specified: Secondary | ICD-10-CM | POA: Diagnosis not present

## 2019-02-27 ENCOUNTER — Ambulatory Visit (INDEPENDENT_AMBULATORY_CARE_PROVIDER_SITE_OTHER): Payer: PPO | Admitting: Adult Health

## 2019-02-27 ENCOUNTER — Encounter

## 2019-02-27 ENCOUNTER — Encounter: Payer: Self-pay | Admitting: Adult Health

## 2019-02-27 ENCOUNTER — Other Ambulatory Visit: Payer: Self-pay

## 2019-02-27 VITALS — BP 130/80 | HR 67 | Temp 98.4°F | Ht 62.0 in | Wt 144.8 lb

## 2019-02-27 DIAGNOSIS — Z8673 Personal history of transient ischemic attack (TIA), and cerebral infarction without residual deficits: Secondary | ICD-10-CM | POA: Diagnosis not present

## 2019-02-27 DIAGNOSIS — E785 Hyperlipidemia, unspecified: Secondary | ICD-10-CM | POA: Diagnosis not present

## 2019-02-27 DIAGNOSIS — G459 Transient cerebral ischemic attack, unspecified: Secondary | ICD-10-CM

## 2019-02-27 DIAGNOSIS — I10 Essential (primary) hypertension: Secondary | ICD-10-CM

## 2019-02-27 DIAGNOSIS — E119 Type 2 diabetes mellitus without complications: Secondary | ICD-10-CM | POA: Diagnosis not present

## 2019-02-27 NOTE — Progress Notes (Signed)
[name] [address] [place]. [place] [address] [phone]       OFFICE FOLLOW UP NOTE  [name] Date of Birth:  05/31/1951 Medical Record Number:  [id number]   Reason for visit:  F/u visit with recent TIA  CHIEF COMPLAINT:  Chief Complaint  Patient presents with   Follow-up    6 mon f/u. Alone. Rm 9. No new concerns at this time.     HPI:  Hospital summary 09/04/2017: Misty Morris is an 68 y.o. female with a past medical history of type 2 diabetes mellitus hypertension, dyslipidemia, fibromyalgia rheumatica who presented to the ER on 09/04/17 with right arm numbness.  Patient was in her usual state of health yesterday and went to bed around 1030pm and woke up around 4 AM this morning went to the bathroom, did not notice any abnormalities when she went to the bathroom.  Upon waking up the morning of admission, she noticed right arm and right leg numbness that seemed to get worse throughout the morning with some weakness in the right arm as well as unsteady gait.  She denies ever having such symptoms, personal history of stroke or family history of stroke.  CT head reviewed was negative for acute hemorrhage.  MRI reviewed and showed acute infarct in the left PCA territory and superimposed small acute subacute appearing infarct in the superior left occipital lobe.  MRA did show multifocal severe stenosis in the left PCA but negative for large vessel occlusion.  2D echo showed an EF of 65-70%.  LDL 143 and A1c 8.7.  It was recommended the patient be discharged on Lipitor 80 mg as well as he is aspirin and Plavix for 3 months and then continue Plavix alone.  Patient discharged home in stable condition.  10/19/2017 visit: Patient has been doing well.  She only has slight right shoulder numbness but denies other numbness or weakness.  Patient states she was unable to tolerate her Plavix as she felt as though her chest was getting "squeezed"shortly after taking  the medication.  Patient did continue to take aspirin 325 mg daily without increase in bleeding or bruising.  Did not take Lipitor as she believes they are "bad drug".  She believes is about all statin medications.  She does state that she has started to eat healthy and participate at the gym daily.  She is also been attending PT at the neuro rehabilitation center where she has 1 more visit and they have been mainly helping her for her balance.  She does have a history of retinitis pigmentosa or according to her ophthalmologist this is almost back to her baseline.  Blood pressure at today's appointment was mildly elevated at 161/91.  Patient states that she does check her blood pressure at home her systolics are usually 301S.  Her fasting sugars have been ranging from 90-107.  Denies new or worsening stroke/TIA symptoms at this time.  04/21/2018 visit: Patient returns today for follow-up visit.  Since prior visit, patient was seen in the ED on 02/15/2018 with left blurry vision and right arm and leg altered sensation.  CT head reviewed and was negative for acute infarct but did show calcified left thyroid mass with multiple enlarged lymph nodes in the neck with calcification pattern likely indicates metastatic thyroid carcinoma.  CTA of the neck showed arthrosclerosis.  CTA of head showed intracranial arthrosclerotic change affecting posterior cerebral artery branches also anterior and middle cerebral artery branches.  MRI was not  performed as she requires sedation and decided to not obtain as result will not change treatment plan.  2D echo showed an EF of 55 to 60% without cardiac source of embolus.  LDL 176 and as though it was recommended to start Lipitor patient requested to be on Crestor.  Patient was previously on aspirin 325 mg daily and recommended DAPT with aspirin and Plavix for 3 months and then single agent alone.  Recommended follow-up with ENT as outpatient for evaluation of possible thyroid  malignancy.  Patient was discharged home in stable condition.   Patient has been doing well overall without residual deficits or symptoms.  She states she has been compliant with all medications.  Patient did call into office on 03/06/2018 with concerns of Plavix causing chest pain.  She states she did stop the Plavix but continued to have chest pain.  Recommended to restart Plavix and to be followed with her primary care.  She states after restarting Plavix she did not experience increasing chest pain and this has since resolved.  She continues to take both aspirin 81 mg and Plavix with bruising but denies bleeding.  Continues to take Crestor without side effects of myalgias.  Patient will be having blood work on Wednesday where she will have a lipid panel and A1c checked along with other routine labs.  Blood pressure today elevated at 163/82 and his patient does monitor this at home she states it is typically lower but she is currently experiencing pain in her bilateral thumbs due to arthritis.  She did have follow-up thyroid ultrasound on 02/23/2018 and a follow-up biopsy was recommended to rule out possible carcinoma.  She was advised by ENT to follow-up in October/November as that will be 3 months post TIA event as she will need to stop Plavix prior to biopsy.  She has returned to all prior activities.  She is currently driving as she was released by her ophthalmologist as her vision has been improving.  She has been working out at [employer] daily without complication.  She states she understands importance of compliance with medication recommendations for secondary stroke prevention.  Denies new or worsening stroke/TIA symptoms.  history 07/26/2018: Patient is being seen today for 14-month follow-up visit. She continues on plavix with mild bruising but no bleeding. Continues on crestor without side effects of myalgias. She will be having lab work done tomorrow morning with PCP. Her prior lab work showed  decreased lipid panel levels. Blood pressure today 155/87 but she does monitor at home and typically range 130's. She continues to follow with opthalmology and was told her vision is stable. She continues to drive short distances and avoids busy highways. She is aware of her limitations. She does endorse difficulty with close vision but this has been stable and present since her first stroke. She did have biopsy of thyroid obtained last week - no results available at this time.  Overall, patient feels as though she has been doing well and is able to be active and maintain independence without difficulty.  No further concerns at this time.  Denies new or worsening stroke/TIA symptoms.  02/27/2019 visit: Misty Morris is a 68 year old female who is being seen today for stroke follow-up.  She has been stable from a neurological standpoint.  Continues on Plavix and Crestor for secondary stroke prevention without reported side effects.  Blood pressure stable.  Her prior thyroid biopsy did come back positive for papillary thyroid carcinoma with undergoing total thyroidectomy and bilateral neck  dissection on 09/2018. She also had radioactive iodine treatment in 11/2018. Continues to follow with endocrinology and radiology regularly. No further concerns at this time.   ROS:   14 system review of systems performed and negative with exception of no complaints  PMH:  Past Medical History:  Diagnosis Date   Fibromyalgia    GERD (gastroesophageal reflux disease)    Hypercholesterolemia    Hypertension    Polymyalgia rheumatica ([place])    Rheumatoid arthritis ([place])    "atypical/MD 08/2017" (02/14/2018)   Shingles    Stroke ([place]) 08/2017   "no residual" (02/14/2018)   TIA (transient ischemic attack) 02/14/2018   Type 2 diabetes, diet controlled ([place])    Vision impairment    right eye    PSH:  Past Surgical History:  Procedure Laterality Date   CATARACT EXTRACTION W/ INTRAOCULAR LENS  IMPLANT, BILATERAL      JOINT REPLACEMENT     OPEN REDUCTION LE FORT I FRACTURE  1980s   PARTIAL KNEE ARTHROPLASTY Left 01/17/2017   Procedure: UNICOMPARTMENTAL LEFT KNEE;  Surgeon: Paralee Cancel, MD;  Location: [facility];  Service: Orthopedics;  Laterality: Left;   RADIOLOGY WITH ANESTHESIA N/A 08/11/2017   Procedure: MRI OF BRAIN WITH AND WITHOUT CONSTRAST, AND OF THE ORBIT WITH AND WITHOUT CONTRAST;  Surgeon: Radiologist, Medication, MD;  Location: [facility];  Service: Radiology;  Laterality: N/A;   RADIOLOGY WITH ANESTHESIA N/A 09/06/2017   Procedure: MRI OF BRAIN;  Surgeon: Radiologist, Medication, MD;  Location: [facility];  Service: Radiology;  Laterality: N/A;   RHINOPLASTY     TOTAL KNEE ARTHROPLASTY Right 09/30/2015   Procedure: TOTAL RIGHT KNEE ARTHROPLASTY;  Surgeon: Paralee Cancel, MD;  Location: [facility];  Service: Orthopedics;  Laterality: Right;   VAGINAL HYSTERECTOMY      Social History:  Social History   Socioeconomic History   Marital status: Divorced    Spouse name: Not on file   Number of children: Not on file   Years of education: Not on file   Highest education level: Not on file  Occupational History   Not on file  Social Needs   Financial resource strain: Not on file   Food insecurity    Worry: Not on file    Inability: Not on file   Transportation needs    Medical: Not on file    Non-medical: Not on file  Tobacco Use   Smoking status: Former Smoker    Packs/day: 1.00    Years: 25.00    Pack years: 25.00    Types: Cigarettes    Quit date: 12/27/2011    Years since quitting: 7.1   Smokeless tobacco: Never Used  Substance and Sexual Activity   Alcohol use: Never    Frequency: Never   Drug use: Yes    Types: Marijuana    Comment: 02/14/2018 "2-3 times/wk"   Sexual activity: Not Currently  Lifestyle   Physical activity    Days per week: Not on file    Minutes per session: Not on file   Stress: Not on file  Relationships   Social connections    Talks on phone:  Not on file    Gets together: Not on file    Attends religious service: Not on file    Active member of club or organization: Not on file    Attends meetings of clubs or organizations: Not on file    Relationship status: Not on file   Intimate partner violence    Fear of  current or ex partner: Not on file    Emotionally abused: Not on file    Physically abused: Not on file    Forced sexual activity: Not on file  Other Topics Concern   Not on file  Social History Narrative   Not on file    Family History:  Family History  Problem Relation Age of Onset   Hypertension Mother    Seizures Mother    Hypertension Father    Hypertension Sister    Pancreatic cancer Other     Medications:   Current Outpatient Medications on File Prior to Visit  Medication Sig Dispense Refill   Acetylcarn-Alpha Lipoic Acid 400-200 MG CAPS Take 1 capsule by mouth 2 (two) times daily.     amLODipine (NORVASC) 5 MG tablet Take 5 mg by mouth every evening.   2   calcium carbonate (TUMS - DOSED IN MG ELEMENTAL CALCIUM) 500 MG chewable tablet Chew 1 tablet by mouth 2 (two) times daily.     cefdinir (OMNICEF) 300 MG capsule Take 1 capsule (300 mg total) by mouth every 12 (twelve) hours. 5 capsule 0   Chromium Picolinate 200 MCG CAPS Take 200 mcg by mouth daily.     clopidogrel (PLAVIX) 75 MG tablet Take 1 tablet (75 mg total) by mouth daily. 30 tablet 3   Coenzyme Q10 (COQ-10) 100 MG CAPS Take 100 mg by mouth daily.     diphenhydrAMINE (BENADRYL) 25 MG tablet Take 25 mg by mouth at bedtime.     levothyroxine (SYNTHROID) 125 MCG tablet Take 125 mcg by mouth daily before breakfast.     Lysine 500 MG CAPS Take 500 mg by mouth every evening.     predniSONE (DELTASONE) 5 MG tablet Take 5 mg by mouth See admin instructions. Take 3 tablets 1mg   and 1 tablet 5 mg to equal  7 mg totally  by mouth every evening     predniSONE 1 MG TBEC Take 2 mg by mouth See admin instructions. Take 3 tablets 1mg    and 1 tablet 5 mg to equal  7 mg totally  by mouth every evening     rosuvastatin (CRESTOR) 20 MG tablet Take 1 tablet (20 mg total) by mouth daily at 6 PM. 30 tablet 3   TIMOPTIC OCUDOSE 0.5 % SOLN Place 1 drop into both eyes daily.   0   ZIOPTAN 0.0015 % SOLN Place 1 drop into both eyes at bedtime.  3   No current facility-administered medications on file prior to visit.     Allergies:   Allergies  Allergen Reactions   Codeine Hives   Demerol [Meperidine] Hives   Latex Dermatitis and Other (See Comments)    Blistering    Morphine And Related Hives   Sulfa Antibiotics Hives   Tramadol Hives   Vicodin [Hydrocodone-Acetaminophen] Itching    Elevated blood pressure. Tolerates low doses.     Physical Exam  Vitals:   02/27/19 1426  BP: 130/80  Pulse: 67  Temp: 98.4 F (36.9 C)  TempSrc: Oral  Weight: 144 lb 12.8 oz (65.7 kg)  Height: 5\' 2"  (1.575 m)   Body mass index is 26.48 kg/m. No exam data present  General: well developed, pleasant elderly Caucasian female, well nourished, seated, in no evident distress Head: head normocephalic and atraumatic.   Neck: supple with no carotid or supraclavicular bruits Cardiovascular: regular rate and rhythm, no murmurs Musculoskeletal: no deformity Skin:  no rash/petichiae Vascular:  Normal pulses all extremities  Neurologic  Exam Mental Status: Awake and fully alert. Oriented to place and time. Recent and remote memory intact. Attention span, concentration and fund of knowledge appropriate. Mood and affect appropriate.  Cranial Nerves: Pupils equal, briskly reactive to light. Extraocular movements full without nystagmus. Hearing intact. Facial sensation intact. Face, tongue, palate moves normally and symmetrically.  Motor: Normal bulk and tone. Normal strength in all tested extremity muscles. Sensory.: intact to touch , pinprick , position and vibratory sensation.  Coordination: Rapid alternating movements normal in all  extremities. Finger-to-nose and heel-to-shin performed accurately bilaterally. Gait and Station: Arises from chair without difficulty. Stance is normal. Gait demonstrates normal stride length and balance . Able to heel, toe and tandem walk without difficulty.  Reflexes: 1+ and symmetric. Toes downgoing.        Diagnostic Data (Labs, Imaging, Testing)  CT HEAD WO CONTRAST CT ANGIO NECK W OR WO CONTRAST CT ANGIO HEAD W OR WO CONTRAST 02/14/18 IMPRESSION: Head CT does not show any acute infarction. Old ischemic changes seen in the left PCA territory. CT angiography of the neck vessels shows atherosclerosis but no stenosis at either carotid bifurcation. No significant posterior circulation finding in the neck. Intracranial CT angiography shows intracranial atherosclerotic change, most pronounced affecting the posterior cerebral artery branches but also present within the anterior and middle cerebral artery branches. The appearance is similar to an MR angiogram done earlier this year. There is tortuosity and atherosclerotic change of the distal vertebral arteries in the proximal basilar artery. Maximal stenosis of the distal right vertebral is 30-40%. Calcified left thyroid mass with multiple enlarged lymph nodes in the left side of the neck with calcification, the pattern likely to indicate metastatic thyroid carcinoma.  ECHOCARDIOGRAM 02/15/2018 Study Conclusions - Left ventricle: The cavity size was normal. Wall thickness was   increased in a pattern of mild LVH. There was moderate focal   basal hypertrophy of the septum. Systolic function was normal.   The estimated ejection fraction was in the range of 55% to 60%.   Wall motion was normal; there were no regional wall motion   abnormalities. Doppler parameters are consistent with abnormal   left ventricular relaxation (grade 1 diastolic dysfunction). - Mitral valve: Calcified annulus. - Atrial septum: No defect or patent  foramen ovale was identified   by color doppler.   ASSESSMENT: Misty Morris is a 68 y.o. year old female here with left PCA infarct on 09/04/17 secondary to small vessel disease. Vascular risk factors are HTN, HLD, and DM.  Patient had recent TIA on 02/14/2018 possibly secondary to large vessel arthrosclerosis and noncompliance of recommended treatment plan for stroke prevention.  Recent diagnosis of papillary thyroid carcinoma with undergoing thyroidectomy and radioactive iodine. Stable from a neurological standpoint.    PLAN: -Continue Plavix 75 mg along with Crestor 20 mg for secondary stroke prevention -f/u with PCP regarding HTN, HLD and DM management  -Advised to continue to stay active and maintain a healthy diet -Continue to monitor blood pressure at home -Maintain strict control of hypertension with blood pressure goal below 130/90, diabetes with hemoglobin A1c goal below 6.5% and cholesterol with LDL cholesterol (bad cholesterol) goal below 70 mg/dL. I also advised the patient to eat a healthy diet with plenty of whole grains, cereals, fruits and vegetables, exercise regularly and maintain ideal body weight.  Stable from stroke standpoint therefore recommend follow up as needed  -Greater than 50% time during this 25 minute consultation visit was spent on counseling and coordination of  care, education regarding diagnosis of prior stroke and TIA, importance of medication compliance, and review of risk factors including HLD, DM and HTN (risk factors), discussion about risk benefit of anticoagulation and answering questions.   Follow up 6 months or call earlier if needed   Venancio Poisson, [facility]  [facility] [address] [place], [place] [address]  Phone [phone] Fax [phone]

## 2019-02-27 NOTE — Progress Notes (Signed)
I agree with the above plan 

## 2019-02-27 NOTE — Patient Instructions (Addendum)
Continue clopidogrel 75 mg daily  and Crestor for secondary stroke prevention  Continue to follow up with PCP regarding blood pressure, diabetes and cholesterol management   Continue to monitor blood pressure at home  Maintain strict control of hypertension with blood pressure goal below 130/90, diabetes with hemoglobin A1c goal below 6.5% and cholesterol with LDL cholesterol (bad cholesterol) goal below 70 mg/dL. I also advised the patient to eat a healthy diet with plenty of whole grains, cereals, fruits and vegetables, exercise regularly and maintain ideal body weight.         Thank you for coming to see [place] at [facility]. I hope we have been able to provide you high quality care today.  You may receive a patient satisfaction survey over the next few weeks. We would appreciate your feedback and comments so that we may continue to improve ourselves and the health of our patients.

## 2019-03-15 DIAGNOSIS — H16102 Unspecified superficial keratitis, left eye: Secondary | ICD-10-CM | POA: Diagnosis not present

## 2019-03-22 DIAGNOSIS — H16102 Unspecified superficial keratitis, left eye: Secondary | ICD-10-CM | POA: Diagnosis not present

## 2019-03-29 DIAGNOSIS — H16102 Unspecified superficial keratitis, left eye: Secondary | ICD-10-CM | POA: Diagnosis not present

## 2019-04-02 DIAGNOSIS — H16102 Unspecified superficial keratitis, left eye: Secondary | ICD-10-CM | POA: Diagnosis not present

## 2019-04-09 DIAGNOSIS — H16102 Unspecified superficial keratitis, left eye: Secondary | ICD-10-CM | POA: Diagnosis not present

## 2019-04-23 DIAGNOSIS — E890 Postprocedural hypothyroidism: Secondary | ICD-10-CM | POA: Diagnosis not present

## 2019-04-23 DIAGNOSIS — C73 Malignant neoplasm of thyroid gland: Secondary | ICD-10-CM | POA: Diagnosis not present

## 2019-04-23 DIAGNOSIS — E892 Postprocedural hypoparathyroidism: Secondary | ICD-10-CM | POA: Diagnosis not present

## 2019-04-23 DIAGNOSIS — E119 Type 2 diabetes mellitus without complications: Secondary | ICD-10-CM | POA: Diagnosis not present

## 2019-04-26 DIAGNOSIS — H18832 Recurrent erosion of cornea, left eye: Secondary | ICD-10-CM | POA: Diagnosis not present

## 2019-05-04 DIAGNOSIS — H18832 Recurrent erosion of cornea, left eye: Secondary | ICD-10-CM | POA: Diagnosis not present

## 2019-05-11 DIAGNOSIS — M8580 Other specified disorders of bone density and structure, unspecified site: Secondary | ICD-10-CM | POA: Diagnosis not present

## 2019-05-11 DIAGNOSIS — Z79899 Other long term (current) drug therapy: Secondary | ICD-10-CM | POA: Diagnosis not present

## 2019-05-11 DIAGNOSIS — E785 Hyperlipidemia, unspecified: Secondary | ICD-10-CM | POA: Diagnosis not present

## 2019-05-11 DIAGNOSIS — M353 Polymyalgia rheumatica: Secondary | ICD-10-CM | POA: Diagnosis not present

## 2019-05-11 DIAGNOSIS — M79644 Pain in right finger(s): Secondary | ICD-10-CM | POA: Diagnosis not present

## 2019-05-11 DIAGNOSIS — M810 Age-related osteoporosis without current pathological fracture: Secondary | ICD-10-CM | POA: Diagnosis not present

## 2019-05-11 DIAGNOSIS — M0579 Rheumatoid arthritis with rheumatoid factor of multiple sites without organ or systems involvement: Secondary | ICD-10-CM | POA: Diagnosis not present

## 2019-05-11 DIAGNOSIS — M797 Fibromyalgia: Secondary | ICD-10-CM | POA: Diagnosis not present

## 2019-05-11 DIAGNOSIS — M25519 Pain in unspecified shoulder: Secondary | ICD-10-CM | POA: Diagnosis not present

## 2019-05-11 DIAGNOSIS — E1169 Type 2 diabetes mellitus with other specified complication: Secondary | ICD-10-CM | POA: Diagnosis not present

## 2019-05-11 DIAGNOSIS — M1990 Unspecified osteoarthritis, unspecified site: Secondary | ICD-10-CM | POA: Diagnosis not present

## 2019-06-22 DIAGNOSIS — Z961 Presence of intraocular lens: Secondary | ICD-10-CM | POA: Diagnosis not present

## 2019-06-22 DIAGNOSIS — H18832 Recurrent erosion of cornea, left eye: Secondary | ICD-10-CM | POA: Diagnosis not present

## 2019-06-22 DIAGNOSIS — H401131 Primary open-angle glaucoma, bilateral, mild stage: Secondary | ICD-10-CM | POA: Diagnosis not present

## 2019-07-24 DIAGNOSIS — E785 Hyperlipidemia, unspecified: Secondary | ICD-10-CM | POA: Diagnosis not present

## 2019-07-24 DIAGNOSIS — Z8673 Personal history of transient ischemic attack (TIA), and cerebral infarction without residual deficits: Secondary | ICD-10-CM | POA: Diagnosis not present

## 2019-07-24 DIAGNOSIS — E119 Type 2 diabetes mellitus without complications: Secondary | ICD-10-CM | POA: Diagnosis not present

## 2019-08-09 DIAGNOSIS — Z23 Encounter for immunization: Secondary | ICD-10-CM | POA: Diagnosis not present

## 2019-08-09 DIAGNOSIS — Z8673 Personal history of transient ischemic attack (TIA), and cerebral infarction without residual deficits: Secondary | ICD-10-CM | POA: Diagnosis not present

## 2019-08-09 DIAGNOSIS — E119 Type 2 diabetes mellitus without complications: Secondary | ICD-10-CM | POA: Diagnosis not present

## 2019-08-09 DIAGNOSIS — Z7189 Other specified counseling: Secondary | ICD-10-CM | POA: Diagnosis not present

## 2019-08-09 DIAGNOSIS — Z0000 Encounter for general adult medical examination without abnormal findings: Secondary | ICD-10-CM | POA: Diagnosis not present

## 2019-08-09 DIAGNOSIS — M353 Polymyalgia rheumatica: Secondary | ICD-10-CM | POA: Diagnosis not present

## 2019-08-20 DIAGNOSIS — E892 Postprocedural hypoparathyroidism: Secondary | ICD-10-CM | POA: Diagnosis not present

## 2019-08-20 DIAGNOSIS — C73 Malignant neoplasm of thyroid gland: Secondary | ICD-10-CM | POA: Diagnosis not present

## 2019-08-20 DIAGNOSIS — E890 Postprocedural hypothyroidism: Secondary | ICD-10-CM | POA: Diagnosis not present

## 2019-08-21 DIAGNOSIS — E892 Postprocedural hypoparathyroidism: Secondary | ICD-10-CM | POA: Diagnosis not present

## 2019-08-21 DIAGNOSIS — C73 Malignant neoplasm of thyroid gland: Secondary | ICD-10-CM | POA: Diagnosis not present

## 2019-08-21 DIAGNOSIS — Z1211 Encounter for screening for malignant neoplasm of colon: Secondary | ICD-10-CM | POA: Diagnosis not present

## 2019-08-21 DIAGNOSIS — E890 Postprocedural hypothyroidism: Secondary | ICD-10-CM | POA: Diagnosis not present

## 2019-08-21 DIAGNOSIS — Z1212 Encounter for screening for malignant neoplasm of rectum: Secondary | ICD-10-CM | POA: Diagnosis not present

## 2019-09-12 DIAGNOSIS — I10 Essential (primary) hypertension: Secondary | ICD-10-CM | POA: Diagnosis not present

## 2019-09-12 DIAGNOSIS — R195 Other fecal abnormalities: Secondary | ICD-10-CM | POA: Diagnosis not present

## 2019-09-12 DIAGNOSIS — I6789 Other cerebrovascular disease: Secondary | ICD-10-CM | POA: Diagnosis not present

## 2019-09-14 DIAGNOSIS — E785 Hyperlipidemia, unspecified: Secondary | ICD-10-CM | POA: Diagnosis not present

## 2019-09-14 DIAGNOSIS — M353 Polymyalgia rheumatica: Secondary | ICD-10-CM | POA: Diagnosis not present

## 2019-09-14 DIAGNOSIS — M1990 Unspecified osteoarthritis, unspecified site: Secondary | ICD-10-CM | POA: Diagnosis not present

## 2019-09-14 DIAGNOSIS — Z7952 Long term (current) use of systemic steroids: Secondary | ICD-10-CM | POA: Diagnosis not present

## 2019-09-14 DIAGNOSIS — E1169 Type 2 diabetes mellitus with other specified complication: Secondary | ICD-10-CM | POA: Diagnosis not present

## 2019-09-14 DIAGNOSIS — M25519 Pain in unspecified shoulder: Secondary | ICD-10-CM | POA: Diagnosis not present

## 2019-09-14 DIAGNOSIS — M797 Fibromyalgia: Secondary | ICD-10-CM | POA: Diagnosis not present

## 2019-09-14 DIAGNOSIS — M0579 Rheumatoid arthritis with rheumatoid factor of multiple sites without organ or systems involvement: Secondary | ICD-10-CM | POA: Diagnosis not present

## 2019-09-14 DIAGNOSIS — M810 Age-related osteoporosis without current pathological fracture: Secondary | ICD-10-CM | POA: Diagnosis not present

## 2019-09-14 DIAGNOSIS — M79644 Pain in right finger(s): Secondary | ICD-10-CM | POA: Diagnosis not present

## 2019-09-24 ENCOUNTER — Other Ambulatory Visit: Payer: Self-pay | Admitting: Gastroenterology

## 2019-10-09 ENCOUNTER — Other Ambulatory Visit (HOSPITAL_COMMUNITY)
Admission: RE | Admit: 2019-10-09 | Discharge: 2019-10-09 | Disposition: A | Discharge status: Home / Self Care | Payer: PPO | Source: Ambulatory Visit | Attending: Gastroenterology | Admitting: Gastroenterology

## 2019-10-09 DIAGNOSIS — Z20822 Contact with and (suspected) exposure to covid-19: Secondary | ICD-10-CM | POA: Diagnosis not present

## 2019-10-09 DIAGNOSIS — Z01812 Encounter for preprocedural laboratory examination: Secondary | ICD-10-CM | POA: Insufficient documentation

## 2019-10-09 LAB — SARS CORONAVIRUS 2 (TAT 6-24 HRS): SARS Coronavirus 2: NEGATIVE

## 2019-10-12 ENCOUNTER — Encounter (HOSPITAL_COMMUNITY)
Admission: RE | Disposition: A | Discharge status: Home / Self Care | Payer: Self-pay | Source: Ambulatory Visit | Attending: Gastroenterology

## 2019-10-12 ENCOUNTER — Ambulatory Visit (HOSPITAL_COMMUNITY): Payer: PPO | Admitting: Certified Registered Nurse Anesthetist

## 2019-10-12 ENCOUNTER — Encounter: Payer: Self-pay | Admitting: *Deleted

## 2019-10-12 ENCOUNTER — Ambulatory Visit (HOSPITAL_COMMUNITY)
Admission: RE | Admit: 2019-10-12 | Discharge: 2019-10-12 | Disposition: A | Discharge status: Home / Self Care | Payer: PPO | Source: Ambulatory Visit | Attending: Gastroenterology | Admitting: Gastroenterology

## 2019-10-12 ENCOUNTER — Other Ambulatory Visit: Payer: Self-pay

## 2019-10-12 DIAGNOSIS — K5730 Diverticulosis of large intestine without perforation or abscess without bleeding: Secondary | ICD-10-CM | POA: Diagnosis not present

## 2019-10-12 DIAGNOSIS — Z7902 Long term (current) use of antithrombotics/antiplatelets: Secondary | ICD-10-CM | POA: Insufficient documentation

## 2019-10-12 DIAGNOSIS — Z6826 Body mass index (BMI) 26.0-26.9, adult: Secondary | ICD-10-CM | POA: Insufficient documentation

## 2019-10-12 DIAGNOSIS — Z79890 Hormone replacement therapy (postmenopausal): Secondary | ICD-10-CM | POA: Insufficient documentation

## 2019-10-12 DIAGNOSIS — D127 Benign neoplasm of rectosigmoid junction: Secondary | ICD-10-CM | POA: Insufficient documentation

## 2019-10-12 DIAGNOSIS — Z8673 Personal history of transient ischemic attack (TIA), and cerebral infarction without residual deficits: Secondary | ICD-10-CM | POA: Diagnosis not present

## 2019-10-12 DIAGNOSIS — K219 Gastro-esophageal reflux disease without esophagitis: Secondary | ICD-10-CM | POA: Diagnosis not present

## 2019-10-12 DIAGNOSIS — E7800 Pure hypercholesterolemia, unspecified: Secondary | ICD-10-CM | POA: Diagnosis not present

## 2019-10-12 DIAGNOSIS — E663 Overweight: Secondary | ICD-10-CM | POA: Insufficient documentation

## 2019-10-12 DIAGNOSIS — I10 Essential (primary) hypertension: Secondary | ICD-10-CM | POA: Diagnosis not present

## 2019-10-12 DIAGNOSIS — D122 Benign neoplasm of ascending colon: Secondary | ICD-10-CM | POA: Diagnosis not present

## 2019-10-12 DIAGNOSIS — M353 Polymyalgia rheumatica: Secondary | ICD-10-CM | POA: Insufficient documentation

## 2019-10-12 DIAGNOSIS — E039 Hypothyroidism, unspecified: Secondary | ICD-10-CM | POA: Insufficient documentation

## 2019-10-12 DIAGNOSIS — M797 Fibromyalgia: Secondary | ICD-10-CM | POA: Insufficient documentation

## 2019-10-12 DIAGNOSIS — Z79899 Other long term (current) drug therapy: Secondary | ICD-10-CM | POA: Insufficient documentation

## 2019-10-12 DIAGNOSIS — Z87891 Personal history of nicotine dependence: Secondary | ICD-10-CM | POA: Insufficient documentation

## 2019-10-12 DIAGNOSIS — K635 Polyp of colon: Secondary | ICD-10-CM | POA: Diagnosis not present

## 2019-10-12 DIAGNOSIS — E119 Type 2 diabetes mellitus without complications: Secondary | ICD-10-CM | POA: Diagnosis not present

## 2019-10-12 DIAGNOSIS — R195 Other fecal abnormalities: Secondary | ICD-10-CM | POA: Diagnosis not present

## 2019-10-12 DIAGNOSIS — D123 Benign neoplasm of transverse colon: Secondary | ICD-10-CM | POA: Diagnosis not present

## 2019-10-12 DIAGNOSIS — D128 Benign neoplasm of rectum: Secondary | ICD-10-CM | POA: Diagnosis not present

## 2019-10-12 DIAGNOSIS — Z8249 Family history of ischemic heart disease and other diseases of the circulatory system: Secondary | ICD-10-CM | POA: Insufficient documentation

## 2019-10-12 DIAGNOSIS — M069 Rheumatoid arthritis, unspecified: Secondary | ICD-10-CM | POA: Diagnosis not present

## 2019-10-12 HISTORY — PX: HEMOSTASIS CLIP PLACEMENT: SHX6857

## 2019-10-12 HISTORY — PX: COLONOSCOPY WITH PROPOFOL: SHX5780

## 2019-10-12 HISTORY — PX: POLYPECTOMY: SHX5525

## 2019-10-12 LAB — GLUCOSE, CAPILLARY: Glucose-Capillary: 97 mg/dL (ref 70–99)

## 2019-10-12 SURGERY — COLONOSCOPY WITH PROPOFOL
Anesthesia: Monitor Anesthesia Care

## 2019-10-12 MED ORDER — PROPOFOL 10 MG/ML IV BOLUS
INTRAVENOUS | Status: DC | PRN
  Administered 2019-10-12 (×6): 20 mg via INTRAVENOUS
  Administered 2019-10-12: 50 mg via INTRAVENOUS

## 2019-10-12 MED ORDER — PROPOFOL 500 MG/50ML IV EMUL
INTRAVENOUS | Status: DC | PRN
  Administered 2019-10-12: 80 ug/kg/min via INTRAVENOUS

## 2019-10-12 MED ORDER — SODIUM CHLORIDE 0.9 % IV SOLN: INTRAVENOUS | Status: DC

## 2019-10-12 MED ORDER — PROPOFOL 10 MG/ML IV BOLUS
INTRAVENOUS | Status: AC
  Filled 2019-10-12: qty 40

## 2019-10-12 MED ORDER — LACTATED RINGERS IV SOLN: INTRAVENOUS | Status: DC

## 2019-10-12 SURGICAL SUPPLY — 22 items

## 2019-10-12 NOTE — Op Note (Signed)
[facility] Patient Name: Misty Morris Procedure Date: 10/12/2019 MRN: [id number] Attending MD: Carol Ada , MD Date of Birth: Aug 11, 1950 CSN: [id number] Age: 69 Admit Type: Outpatient Procedure:                Colonoscopy Indications:              Positive Cologuard test Providers:                Carol Ada, MD, Kary Kos, RN, Theodora Blow,                            Technician Referring MD:              Medicines:                Propofol per Anesthesia Complications:            No immediate complications. Estimated Blood Loss:     Estimated blood loss was minimal. Procedure:                Pre-Anesthesia Assessment:                           - Prior to the procedure, a History and Physical                            was performed, and patient medications and                            allergies were reviewed. The patient's tolerance of                            previous anesthesia was also reviewed. The risks                            and benefits of the procedure and the sedation                            options and risks were discussed with the patient.                            All questions were answered, and informed consent                            was obtained. Prior Anticoagulants: The patient has                            taken Plavix (clopidogrel), last dose was 5 days                            prior to procedure. ASA Grade Assessment: III - A                            patient with severe systemic disease. After  reviewing the risks and benefits, the patient was                            deemed in satisfactory condition to undergo the                            procedure.                           - Sedation was administered by an anesthesia                            professional. Deep sedation was attained.                           After obtaining informed consent, the colonoscope                             was passed under direct vision. Throughout the                            procedure, the patient's blood pressure, pulse, and                            oxygen saturations were monitored continuously. The                            [id number] Olympus colonoscope was                            introduced through the anus and advanced to the the                            cecum, identified by appendiceal orifice and                            ileocecal valve. The colonoscopy was performed                            without difficulty. The patient tolerated the                            procedure well. The quality of the bowel                            preparation was good. The ileocecal valve,                            appendiceal orifice, and rectum were photographed. Scope In: 9:27:35 AM Scope Out: 9:51:44 AM Scope Withdrawal Time: 0 hours 17 minutes 21 seconds  Total Procedure Duration: 0 hours 24 minutes 9 seconds  Findings:      Three sessile polyps were found in the rectum, transverse colon and       ascending colon. The polyps were 2 to 8 mm in size. These polyps  were       removed with a cold snare. Resection and retrieval were complete. To       prevent bleeding post-intervention, two hemostatic clips were       successfully placed (MR unsafe). There was no bleeding at the end of the       procedure.      A 15 mm polyp was found in the rectum. The polyp was pedunculated. The       polyp was removed with a hot snare. Resection and retrieval were       complete. To prevent bleeding post-intervention, one hemostatic clip was       successfully placed (MR unsafe). There was no bleeding at the end of the       procedure.      Scattered small and large-mouthed diverticula were found in the sigmoid       colon and descending colon. Impression:               - Three 2 to 8 mm polyps in the rectum, in the                            transverse colon and in the ascending colon,                             removed with a cold snare. Resected and retrieved.                            Clips (MR unsafe) were placed.                           - One 15 mm polyp in the rectum, removed with a hot                            snare. Resected and retrieved. Clip (MR unsafe) was                            placed.                           - Diverticulosis in the sigmoid colon and in the                            descending colon. Moderate Sedation:      Not Applicable - Patient had care per Anesthesia. Recommendation:           - Patient has a contact number available for                            emergencies. The signs and symptoms of potential                            delayed complications were discussed with the                            patient. Return to normal activities tomorrow.  Written discharge instructions were provided to the                            patient.                           - Resume previous diet.                           - Continue present medications.                           - Await pathology results.                           - Repeat colonoscopy in 3 years for surveillance.                           - Resume Plavix in 3 days. Procedure Code(s):        --- Professional ---                           [phone], Colonoscopy, flexible; with removal of                            tumor(s), polyp(s), or other lesion(s) by snare                            technique Diagnosis Code(s):        --- Professional ---                           K62.1, Rectal polyp                           K63.5, Polyp of colon                           R19.5, Other fecal abnormalities                           K57.30, Diverticulosis of large intestine without                            perforation or abscess without bleeding CPT copyright 2019 [employer]. All rights reserved. The codes documented in this report are preliminary  and upon coder review may  be revised to meet current compliance requirements. Carol Ada, MD Carol Ada, MD 10/12/2019 10:02:32 AM This report has been signed electronically. Number of Addenda: 0

## 2019-10-12 NOTE — Discharge Instructions (Signed)

## 2019-10-12 NOTE — Transfer of Care (Signed)
Immediate Anesthesia Transfer of Care Note  Patient: Misty Morris  Procedure(s) Performed: Procedure(s): COLONOSCOPY WITH PROPOFOL (N/A) POLYPECTOMY HEMOSTASIS CLIP PLACEMENT  Patient Location: PACU  Anesthesia Type:MAC  Level of Consciousness:  sedated, patient cooperative and responds to stimulation  Airway & Oxygen Therapy:Patient Spontanous Breathing and Patient connected to face mask oxgen  Post-op Assessment:  Report given to PACU RN and Post -op Vital signs reviewed and stable  Post vital signs:  Reviewed and stable  Last Vitals:  Vitals:   10/12/19 0850  BP: (!) 187/82  Pulse: 77  Resp: 12  Temp: 37.2 C  SpO2: 459%    Complications: No apparent anesthesia complications

## 2019-10-12 NOTE — Anesthesia Postprocedure Evaluation (Signed)
Anesthesia Post Note  Patient: Misty Morris  Procedure(s) Performed: COLONOSCOPY WITH PROPOFOL (N/A ) POLYPECTOMY HEMOSTASIS CLIP PLACEMENT     Patient location during evaluation: Endoscopy Anesthesia Type: MAC Level of consciousness: sedated Pain management: pain level controlled Vital Signs Assessment: post-procedure vital signs reviewed and stable Respiratory status: spontaneous breathing Cardiovascular status: stable Postop Assessment: no apparent nausea or vomiting Anesthetic complications: no    Last Vitals:  Vitals:   10/12/19 1015 10/12/19 1020  BP:  (!) 172/67  Pulse: 72 (!) 56  Resp: 13 14  Temp:    SpO2: 93% 96%    Last Pain:  Vitals:   10/12/19 1011  TempSrc:   PainSc: 0-No pain   Pain Goal:                   Huston Foley

## 2019-10-12 NOTE — H&P (Signed)
  Misty Morris HPI: With routine testing the patient was found to have a positive Cologuard. For the past couple of months the patient reports having some mild hematochezia. She does have a history of a thrombosed hemorrhoid many years ago. The blood is very minor and only noticeable on the toilet tissue. It is very intermittent. She denies any issues with melena, diarrhea, or constipation. She denies having a prior colonoscopy and there is no known family history of colon cancer. The patient does not have any problems with chest pain, SOB, or MI.    Past Medical History:  Diagnosis Date  . Fibromyalgia   . GERD (gastroesophageal reflux disease)   . Hypercholesterolemia   . Hypertension   . Polymyalgia rheumatica ([place])   . Rheumatoid arthritis ([place])    "atypical/MD 08/2017" (02/14/2018)  . Shingles   . Stroke ([place]) 08/2017   "no residual" (02/14/2018)  . TIA (transient ischemic attack) 02/14/2018  . Type 2 diabetes, diet controlled ([place])   . Vision impairment    right eye    Past Surgical History:  Procedure Laterality Date  . CATARACT EXTRACTION W/ INTRAOCULAR LENS  IMPLANT, BILATERAL    . JOINT REPLACEMENT    . OPEN REDUCTION LE FORT I FRACTURE  1980s  . PARTIAL KNEE ARTHROPLASTY Left 01/17/2017   Procedure: UNICOMPARTMENTAL LEFT KNEE;  Surgeon: Paralee Cancel, MD;  Location: [facility];  Service: Orthopedics;  Laterality: Left;  . RADIOLOGY WITH ANESTHESIA N/A 08/11/2017   Procedure: MRI OF BRAIN WITH AND WITHOUT CONSTRAST, AND OF THE ORBIT WITH AND WITHOUT CONTRAST;  Surgeon: Radiologist, Medication, MD;  Location: [facility];  Service: Radiology;  Laterality: N/A;  . RADIOLOGY WITH ANESTHESIA N/A 09/06/2017   Procedure: MRI OF BRAIN;  Surgeon: Radiologist, Medication, MD;  Location: [facility];  Service: Radiology;  Laterality: N/A;  . RHINOPLASTY    . TOTAL KNEE ARTHROPLASTY Right 09/30/2015   Procedure: TOTAL RIGHT KNEE ARTHROPLASTY;  Surgeon: Paralee Cancel, MD;  Location: [facility];  Service:  Orthopedics;  Laterality: Right;  Marland Kitchen VAGINAL HYSTERECTOMY      Family History  Problem Relation Age of Onset  . Hypertension Mother   . Seizures Mother   . Hypertension Father   . Hypertension Sister   . Pancreatic cancer Other     Social History:  reports that she quit smoking about 7 years ago. Her smoking use included cigarettes. She has a 25.00 pack-year smoking history. She has never used smokeless tobacco. She reports current drug use. Drug: Marijuana. She reports that she does not drink alcohol.  Allergies:  Allergies  Allergen Reactions  . Codeine Hives  . Demerol [Meperidine] Hives  . Latex Dermatitis and Other (See Comments)    Blistering   . Morphine And Related Hives  . Sulfa Antibiotics Hives  . Tramadol Hives  . Vicodin [Hydrocodone-Acetaminophen] Itching    Elevated blood pressure. Tolerates low doses.    Medications: Scheduled: Continuous:  No results found for this or any previous visit (from the past 24 hour(s)).   No results found.  ROS:  As stated above in the HPI otherwise negative.  There were no vitals taken for this visit.    PE: Gen: NAD, Alert and Oriented HEENT:  [place]/AT, EOMI Neck: Supple, no LAD Lungs: CTA Bilaterally CV: RRR without M/G/R ABM: Soft, NTND, +BS Ext: No C/C/E  Assessment/Plan: 1) Positive Cologuard - Colonoscopy.  Misty Morris D 10/12/2019, 7:02 AM

## 2019-10-12 NOTE — Anesthesia Preprocedure Evaluation (Signed)
Anesthesia Evaluation    Airway Mallampati: I       Dental no notable dental hx. (+) Teeth Intact   Pulmonary neg pulmonary ROS, former smoker,    Pulmonary exam normal        Cardiovascular hypertension, Pt. on medications Normal cardiovascular exam Rhythm:Regular Rate:Normal     Neuro/Psych negative psych ROS   GI/Hepatic   Endo/Other  diabetes, Type 2Hypothyroidism   Renal/GU   negative genitourinary   Musculoskeletal   Abdominal Normal abdominal exam  (+)   Peds  Hematology   Anesthesia Other Findings   Reproductive/Obstetrics                             Anesthesia Physical Anesthesia Plan  ASA: II  Anesthesia Plan: MAC   Post-op Pain Management:    Induction:   PONV Risk Score and Plan: 2 and Propofol infusion  Airway Management Planned: Natural Airway, Mask and Nasal Cannula  Additional Equipment: None  Intra-op Plan:   Post-operative Plan:   Informed Consent: I have reviewed the patients History and Physical, chart, labs and discussed the procedure including the risks, benefits and alternatives for the proposed anesthesia with the patient or authorized representative who has indicated his/her understanding and acceptance.       Plan Discussed with:   Anesthesia Plan Comments:         Anesthesia Quick Evaluation

## 2019-10-15 LAB — SURGICAL PATHOLOGY

## 2019-10-18 DIAGNOSIS — H401131 Primary open-angle glaucoma, bilateral, mild stage: Secondary | ICD-10-CM | POA: Diagnosis not present

## 2019-10-18 DIAGNOSIS — H18832 Recurrent erosion of cornea, left eye: Secondary | ICD-10-CM | POA: Diagnosis not present

## 2019-10-29 DIAGNOSIS — E119 Type 2 diabetes mellitus without complications: Secondary | ICD-10-CM | POA: Diagnosis not present

## 2019-11-05 DIAGNOSIS — M810 Age-related osteoporosis without current pathological fracture: Secondary | ICD-10-CM | POA: Diagnosis not present

## 2019-11-05 DIAGNOSIS — D126 Benign neoplasm of colon, unspecified: Secondary | ICD-10-CM | POA: Diagnosis not present

## 2019-11-05 DIAGNOSIS — Z87440 Personal history of urinary (tract) infections: Secondary | ICD-10-CM | POA: Diagnosis not present

## 2019-11-05 DIAGNOSIS — M1990 Unspecified osteoarthritis, unspecified site: Secondary | ICD-10-CM | POA: Diagnosis not present

## 2019-11-05 DIAGNOSIS — E785 Hyperlipidemia, unspecified: Secondary | ICD-10-CM | POA: Diagnosis not present

## 2019-11-05 DIAGNOSIS — Z7952 Long term (current) use of systemic steroids: Secondary | ICD-10-CM | POA: Diagnosis not present

## 2019-11-05 DIAGNOSIS — E119 Type 2 diabetes mellitus without complications: Secondary | ICD-10-CM | POA: Diagnosis not present

## 2019-11-05 DIAGNOSIS — Z8673 Personal history of transient ischemic attack (TIA), and cerebral infarction without residual deficits: Secondary | ICD-10-CM | POA: Diagnosis not present

## 2019-11-15 IMAGING — MR MR MRA NECK WO/W CM
11 of 21 series · 20 of 48 positions shown · IV contrast (multihance)
Comparison: Brain and orbit MRI without and with contrast
08/11/2017.

ADDENDUM:
Study discussed by telephone with Dr. Shi on 09/06/2017 at 1079
hours.

I advised her that the thin rim of FLAIR hyperintensity along the
posterior left occipital lobe (corresponding to the questionable
small SDH in #2) might be artifact due to hyper-oxygenation of the
patient in the setting of general anesthesia under which this study
was performed.
CLINICAL DATA: 66-year-old female who presented with acute onset
right extremity numbness on 09/04/2017.
Recent loss of vision in the right eye, improving since onset, for
which MRI evaluation was performed earlier this month.
EXAM:
MRI HEAD WITHOUT AND WITH CONTRAST
MRA HEAD WITHOUT CONTRAST
MRA NECK WITHOUT AND WITH CONTRAST
TECHNIQUE: Multiplanar, multiecho pulse sequences of the brain and surrounding
structures were obtained without and with intravenous contrast.
Angiographic images of the Circle of Willis were obtained using MRA
technique without intravenous contrast. Angiographic images of the
neck were obtained using MRA technique without and with intravenous
contrast. Carotid stenosis measurements (when applicable) are
obtained utilizing NASCET criteria, using the distal internal
carotid diameter as the denominator.
CONTRAST:  15mL MULTIHANCE GADOBENATE DIMEGLUMINE 529 MG/ML IV SOLN

[Series 3: DWI · axial · 3.0mm · 0.94mm/px · z∈[-40,+107]mm · 4 of 100 slices shown (1 of 2)]
[im 1/100]
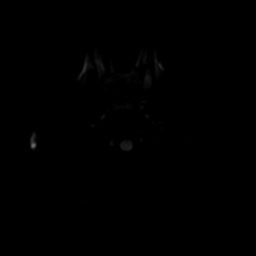
[im 34/100]
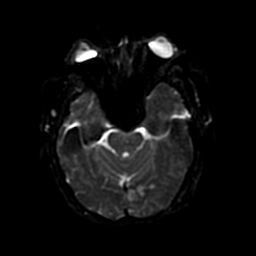
[im 67/100]
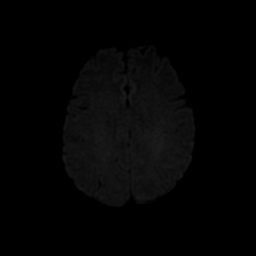
[im 100/100]
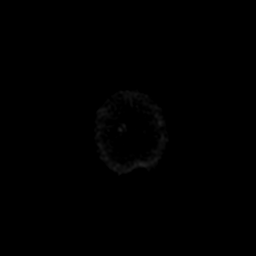

[Series 4: ax (id) 2 · axial · 1.0mm · 0.43mm/px · z∈[-40,+51]mm · 6 of 184 slices shown]
[im 1/184]
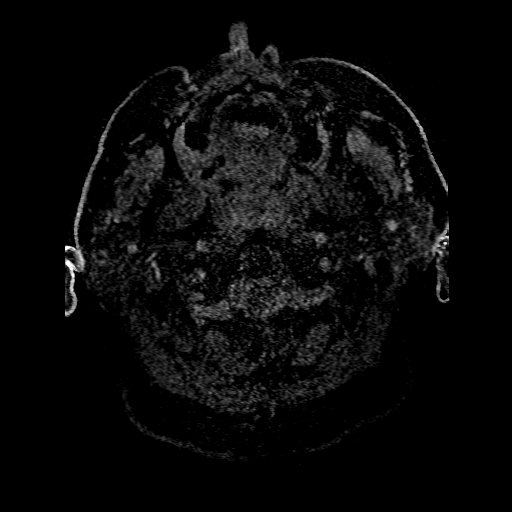
[im 37/184]
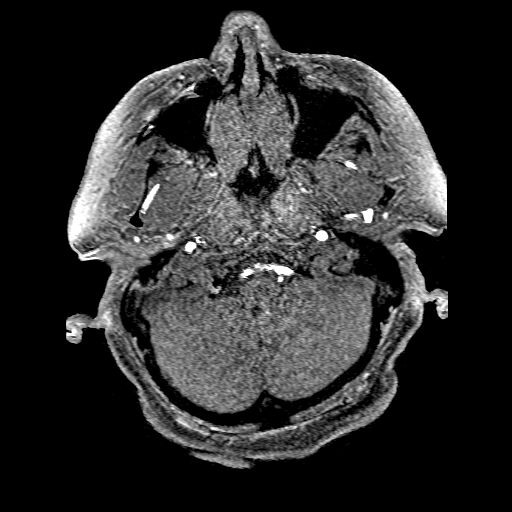
[im 74/184]
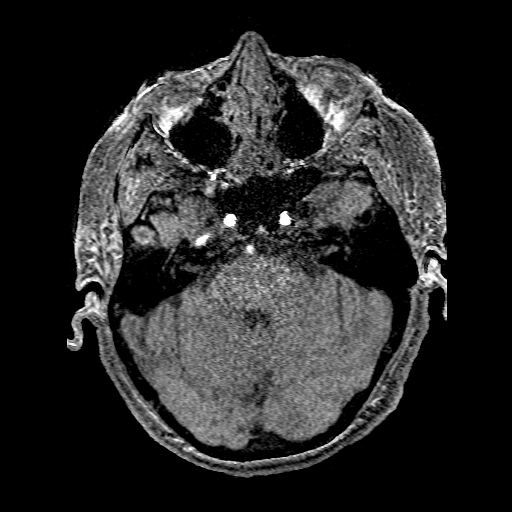
[im 110/184]
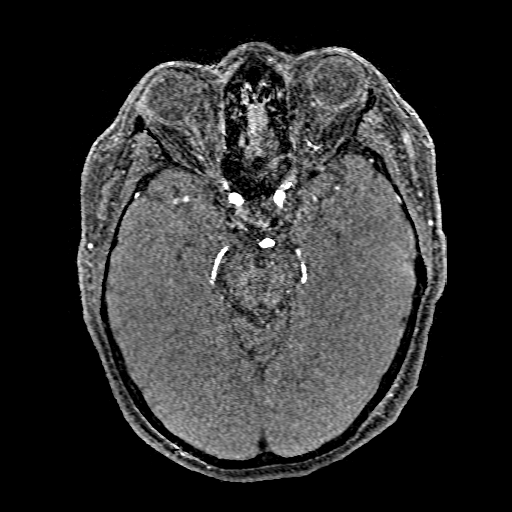
[im 147/184]
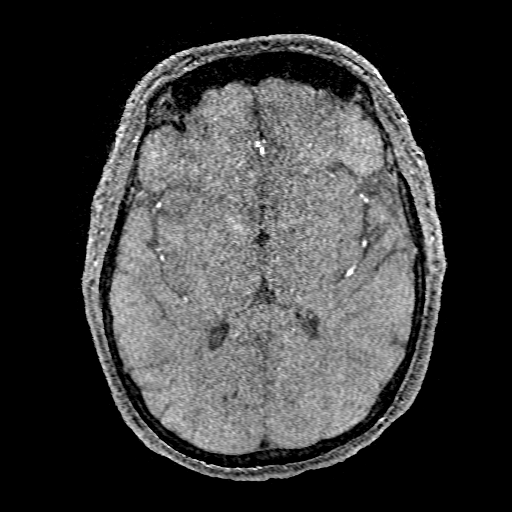
[im 184/184]
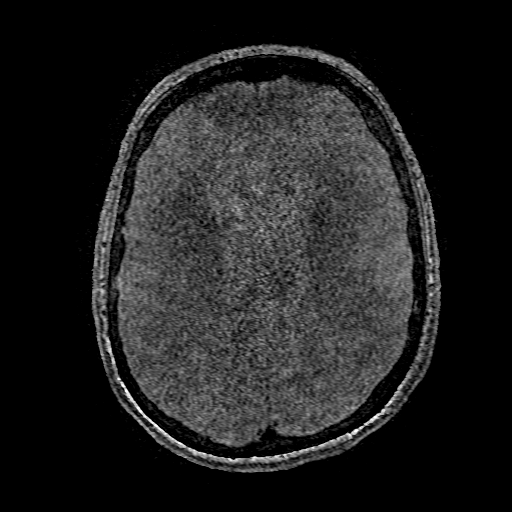

[Series 5: DWI · coronal · 4.0mm · 0.94mm/px · 2 of 72 slices shown (2 of 2)]
[im 1/72]
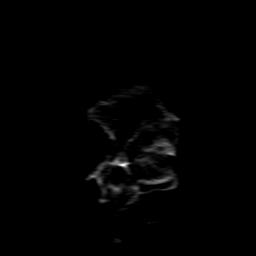
[im 72/72]
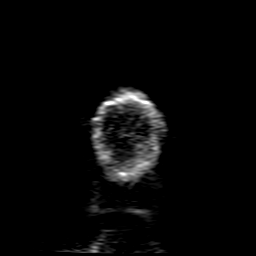

[Series 6: FLAIR · sagittal · 5.0mm · 0.47mm/px · 1 of 23 slices shown (1 of 2)]
[im 1/23]
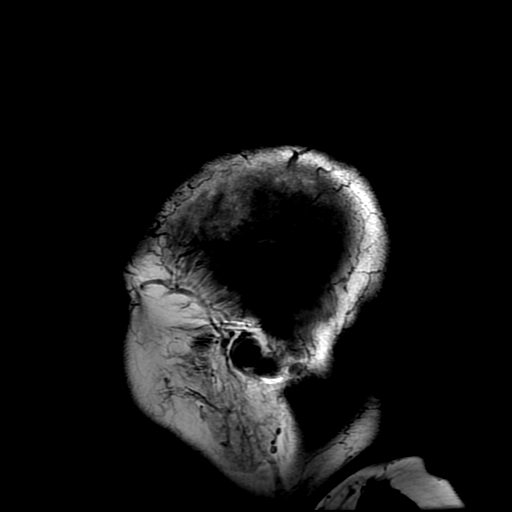

[Series 7: T2 · axial · 5.0mm · 0.47mm/px · 1 of 27 slices shown]
[im 1/27]
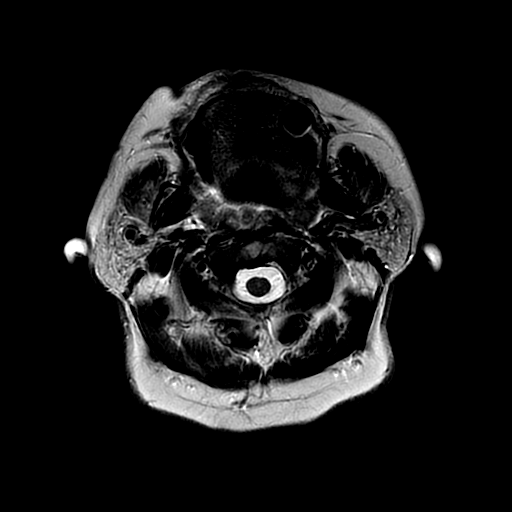

[Series 8: FLAIR · axial · 5.0mm · 0.47mm/px · 1 of 27 slices shown (2 of 2)]
[im 1/27]
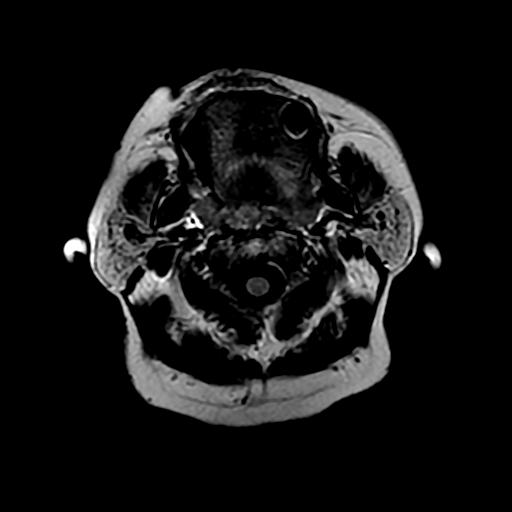

[Series 9: (person_name) · axial · 3.0mm · 0.47mm/px · 1 of 100 slices shown]
[im 1/100]
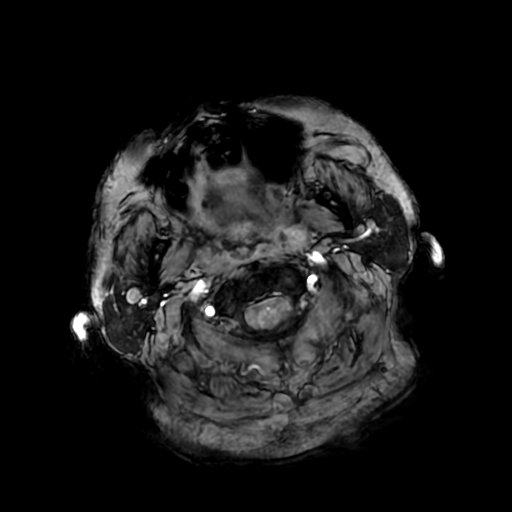

[Series 11: T2 post-contrast · coronal · 5.0mm · 0.39mm/px · 1 of 27 slices shown]
[im 1/27]
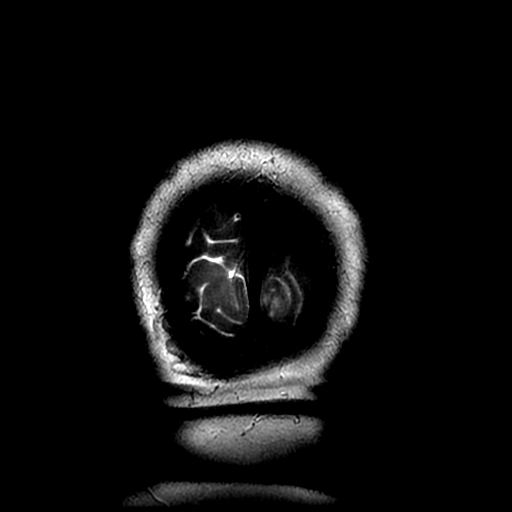

[Series 17: T1 · coronal · 5.0mm · 0.39mm/px · 1 of 27 slices shown]
[im 1/27]
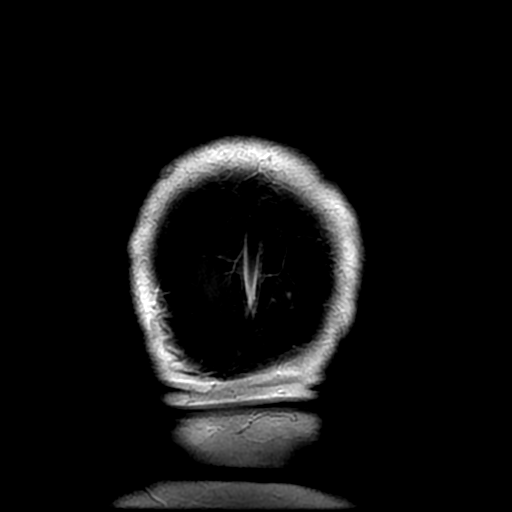

[Series 350: ADC · axial · 3.0mm · 0.94mm/px · 1 of 47 slices shown (1 of 2)]
[im 1/47]
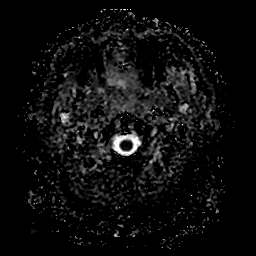

[Series 550: ADC · coronal · 4.0mm · 0.94mm/px · 1 of 36 slices shown (2 of 2)]
[im 1/36]
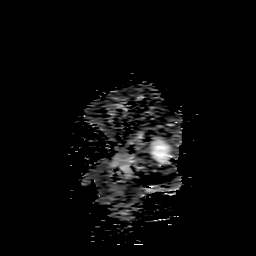

[20 of 48 positions shown; findings below may reference images not displayed]

FINDINGS: MRI HEAD FINDINGS

Brain: New since the 08/11/2017 comparison there are scattered
mostly small areas of restricted diffusion in the left PCA
territory, including the occipital pole, about the calcarine sulcus,
in the left thalamus, the left splenium, and also affecting the tail
of the left hippocampus (series 3, image 24). Associated T2 and
FLAIR hyperintensity in the areas of abnormal diffusion. There is
associated petechial hemorrhage in the left occipital lobe. No
associated mass effect. Trace associated post ischemic enhancement,
primarily at the left occipital pole.

Furthermore, there is a small cortically based area of mixed
appearing diffusion signal at the junction of the superior left
occipital and parietal lobes along the posterior parieto-occipital
sulcus as seen on series 3, image 32. This has T2 and FLAIR
hyperintensity with new cortical hemosiderin from the recent MRI
(series 9, image 67), and questionable trace posterior convexity
left subdural blood (series 8, image 17).

No definite extra-axial hemorrhage elsewhere. No intraventricular
hemorrhage or ventriculomegaly. No contralateral right hemisphere or
posterior fossa restricted diffusion.

Stable gray and white matter signal elsewhere with patchy bilateral
cerebral white matter T2 and FLAIR hyperintensity. No
ventriculomegaly. Normal basilar cisterns. Negative pituitary and
cervicomedullary junction. No dural thickening. No other abnormal
intracranial enhancement; small developmental venous anomaly in the
inferior right basal ganglia redemonstrated (normal variant series
17, image 19).

Vascular: Major intracranial vascular flow voids are stable. Mild
intracranial artery tortuosity. The distal right vertebral artery
appears dominant.

Skull and upper cervical spine: Negative visualized cervical spine.
Normal bone marrow signal.

Sinuses/Orbits: Stable and negative.

Other: Fluid layering in the pharynx. The patient appears to be
intubated currently. Visible internal auditory structures appear
normal. Mastoids remain clear. Negative scalp soft tissues.

MRA NECK FINDINGS

Precontrast time-of-flight images reveal antegrade flow in both
carotid and vertebral arteries throughout the neck and to the skull
base. Post-contrast neck MRA images reveal a 3 vessel arch
configuration. No great vessel origin stenosis.

Normal right CCA aside from mild tortuosity. There is mild
irregularity at the right ICA origin and bulb compatible with
atherosclerosis, but no associated stenosis. There is tortuosity of
the cervical right ICA just distal to the bulb with a mildly kinked
appearance, but otherwise no cervical right ICA stenosis.

Negative left CCA aside from tortuosity. There is moderate
irregularity at the left carotid bifurcation in keeping with
atherosclerosis. Incidental stenosis of the left ECA origin is
noted. No significant proximal left ICA stenosis. Tortuosity of the
ICA just distal to the bulb with a kinked appearance similar to that
on the left. No other cervical left ICA stenosis.

Tortuous bilateral vertebral arteries in the neck. The right is
mildly dominant. No stenosis at the vertebral artery origins or
elsewhere proximal to the skull base.

MRA HEAD FINDINGS

Tortuous distal vertebral arteries, the right is dominant. Both PICA
origins are patent. There is mild to moderate irregularity and
stenosis in the right V4 segment, better demonstrated on the
post-contrast MRA neck images above (series [id number], image 8). No
distal left vertebral artery stenosis. Patent vertebrobasilar
junction. Tortuous basilar artery without stenosis. Normal SCA and
PCA origins. Posterior communicating arteries are diminutive or
absent.

There is multifocal severe irregularity and stenosis in the left PCA
P1 and P2 segments. However, there is preserved distal left PCA flow
signal. There is moderate to severe stenosis in the distal right
PCA, P3 segments.

Antegrade flow in both ICA siphons. No siphon stenosis. Normal
ophthalmic artery origins. Patent carotid termini. Normal MCA and
ACA origins. Visible bilateral ACA branches are within normal
limits. Bilateral MCA M1 segments and MCA bifurcations appear patent
without stenosis. Visible bilateral MCA branches are within normal
limits.
IMPRESSION: 1. Positive for scattered acute infarcts in the Left PCA territory
with petechial hemorrhage.
2. Superimposed small subacute appearing infarct with hemosiderin in
the superior left occipital lobe (series 8, image 17) with
questionable associated trace left posterior convexity Subdural
Hematoma. Recommend repeat noncontrast Head CT with attention to
this area.
3. MRA positive for multifocal severe stenosis in the left PCA, but
no large vessel occlusion.
4. Neck MRA reveals tortuous cervical carotid and vertebral arteries
with atherosclerosis but no stenosis.
5. Head MRA reveals intracranial atherosclerosis with up to moderate
stenosis of the dominant distal right vertebral artery, and severe
stenosis the right PCA P3 division.
6. Aside from #1 and #2 stable MRI appearance of the brain since
08/11/2017.

## 2020-01-14 DIAGNOSIS — M19071 Primary osteoarthritis, right ankle and foot: Secondary | ICD-10-CM | POA: Diagnosis not present

## 2020-01-14 DIAGNOSIS — M19041 Primary osteoarthritis, right hand: Secondary | ICD-10-CM | POA: Diagnosis not present

## 2020-01-14 DIAGNOSIS — Z87440 Personal history of urinary (tract) infections: Secondary | ICD-10-CM | POA: Diagnosis not present

## 2020-01-14 DIAGNOSIS — M79671 Pain in right foot: Secondary | ICD-10-CM | POA: Diagnosis not present

## 2020-01-14 DIAGNOSIS — M810 Age-related osteoporosis without current pathological fracture: Secondary | ICD-10-CM | POA: Diagnosis not present

## 2020-01-14 DIAGNOSIS — E785 Hyperlipidemia, unspecified: Secondary | ICD-10-CM | POA: Diagnosis not present

## 2020-01-14 DIAGNOSIS — E1169 Type 2 diabetes mellitus with other specified complication: Secondary | ICD-10-CM | POA: Diagnosis not present

## 2020-01-14 DIAGNOSIS — M353 Polymyalgia rheumatica: Secondary | ICD-10-CM | POA: Diagnosis not present

## 2020-01-14 DIAGNOSIS — Z7952 Long term (current) use of systemic steroids: Secondary | ICD-10-CM | POA: Diagnosis not present

## 2020-01-14 DIAGNOSIS — L609 Nail disorder, unspecified: Secondary | ICD-10-CM | POA: Diagnosis not present

## 2020-01-14 DIAGNOSIS — M79672 Pain in left foot: Secondary | ICD-10-CM | POA: Diagnosis not present

## 2020-01-14 DIAGNOSIS — M79641 Pain in right hand: Secondary | ICD-10-CM | POA: Diagnosis not present

## 2020-01-14 DIAGNOSIS — M79644 Pain in right finger(s): Secondary | ICD-10-CM | POA: Diagnosis not present

## 2020-01-14 DIAGNOSIS — M19042 Primary osteoarthritis, left hand: Secondary | ICD-10-CM | POA: Diagnosis not present

## 2020-01-14 DIAGNOSIS — M797 Fibromyalgia: Secondary | ICD-10-CM | POA: Diagnosis not present

## 2020-01-14 DIAGNOSIS — M1990 Unspecified osteoarthritis, unspecified site: Secondary | ICD-10-CM | POA: Diagnosis not present

## 2020-01-14 DIAGNOSIS — M79642 Pain in left hand: Secondary | ICD-10-CM | POA: Diagnosis not present

## 2020-01-14 DIAGNOSIS — M19072 Primary osteoarthritis, left ankle and foot: Secondary | ICD-10-CM | POA: Diagnosis not present

## 2020-01-14 DIAGNOSIS — E7800 Pure hypercholesterolemia, unspecified: Secondary | ICD-10-CM | POA: Diagnosis not present

## 2020-01-15 DIAGNOSIS — M353 Polymyalgia rheumatica: Secondary | ICD-10-CM | POA: Diagnosis not present

## 2020-01-15 DIAGNOSIS — E119 Type 2 diabetes mellitus without complications: Secondary | ICD-10-CM | POA: Diagnosis not present

## 2020-01-15 DIAGNOSIS — M8580 Other specified disorders of bone density and structure, unspecified site: Secondary | ICD-10-CM | POA: Diagnosis not present

## 2020-01-15 DIAGNOSIS — N183 Chronic kidney disease, stage 3 unspecified: Secondary | ICD-10-CM | POA: Diagnosis not present

## 2020-01-15 DIAGNOSIS — Z7952 Long term (current) use of systemic steroids: Secondary | ICD-10-CM | POA: Diagnosis not present

## 2020-01-15 DIAGNOSIS — I69331 Monoplegia of upper limb following cerebral infarction affecting right dominant side: Secondary | ICD-10-CM | POA: Diagnosis not present

## 2020-01-15 DIAGNOSIS — M0550 Rheumatoid polyneuropathy with rheumatoid arthritis of unspecified site: Secondary | ICD-10-CM | POA: Diagnosis not present

## 2020-01-15 DIAGNOSIS — T380X5D Adverse effect of glucocorticoids and synthetic analogues, subsequent encounter: Secondary | ICD-10-CM | POA: Diagnosis not present

## 2020-01-15 DIAGNOSIS — I129 Hypertensive chronic kidney disease with stage 1 through stage 4 chronic kidney disease, or unspecified chronic kidney disease: Secondary | ICD-10-CM | POA: Diagnosis not present

## 2020-01-15 DIAGNOSIS — Z6826 Body mass index (BMI) 26.0-26.9, adult: Secondary | ICD-10-CM | POA: Diagnosis not present

## 2020-01-15 DIAGNOSIS — E273 Drug-induced adrenocortical insufficiency: Secondary | ICD-10-CM | POA: Diagnosis not present

## 2020-01-15 DIAGNOSIS — E890 Postprocedural hypothyroidism: Secondary | ICD-10-CM | POA: Diagnosis not present

## 2020-02-18 DIAGNOSIS — E892 Postprocedural hypoparathyroidism: Secondary | ICD-10-CM | POA: Diagnosis not present

## 2020-02-18 DIAGNOSIS — E890 Postprocedural hypothyroidism: Secondary | ICD-10-CM | POA: Diagnosis not present

## 2020-02-18 DIAGNOSIS — C73 Malignant neoplasm of thyroid gland: Secondary | ICD-10-CM | POA: Diagnosis not present

## 2020-02-18 DIAGNOSIS — H401131 Primary open-angle glaucoma, bilateral, mild stage: Secondary | ICD-10-CM | POA: Diagnosis not present

## 2020-03-28 DIAGNOSIS — R350 Frequency of micturition: Secondary | ICD-10-CM | POA: Diagnosis not present

## 2020-03-28 DIAGNOSIS — N390 Urinary tract infection, site not specified: Secondary | ICD-10-CM | POA: Diagnosis not present

## 2020-04-01 DIAGNOSIS — N390 Urinary tract infection, site not specified: Secondary | ICD-10-CM | POA: Diagnosis not present

## 2020-04-01 DIAGNOSIS — R350 Frequency of micturition: Secondary | ICD-10-CM | POA: Diagnosis not present

## 2020-04-01 DIAGNOSIS — R1030 Lower abdominal pain, unspecified: Secondary | ICD-10-CM | POA: Diagnosis not present

## 2020-04-15 DIAGNOSIS — M810 Age-related osteoporosis without current pathological fracture: Secondary | ICD-10-CM | POA: Diagnosis not present

## 2020-04-15 DIAGNOSIS — M797 Fibromyalgia: Secondary | ICD-10-CM | POA: Diagnosis not present

## 2020-04-15 DIAGNOSIS — M1990 Unspecified osteoarthritis, unspecified site: Secondary | ICD-10-CM | POA: Diagnosis not present

## 2020-04-15 DIAGNOSIS — L609 Nail disorder, unspecified: Secondary | ICD-10-CM | POA: Diagnosis not present

## 2020-04-15 DIAGNOSIS — M79644 Pain in right finger(s): Secondary | ICD-10-CM | POA: Diagnosis not present

## 2020-04-15 DIAGNOSIS — E1169 Type 2 diabetes mellitus with other specified complication: Secondary | ICD-10-CM | POA: Diagnosis not present

## 2020-04-15 DIAGNOSIS — E785 Hyperlipidemia, unspecified: Secondary | ICD-10-CM | POA: Diagnosis not present

## 2020-04-15 DIAGNOSIS — Z7952 Long term (current) use of systemic steroids: Secondary | ICD-10-CM | POA: Diagnosis not present

## 2020-04-15 DIAGNOSIS — M353 Polymyalgia rheumatica: Secondary | ICD-10-CM | POA: Diagnosis not present

## 2020-04-24 DIAGNOSIS — L821 Other seborrheic keratosis: Secondary | ICD-10-CM | POA: Diagnosis not present

## 2020-04-24 DIAGNOSIS — L819 Disorder of pigmentation, unspecified: Secondary | ICD-10-CM | POA: Diagnosis not present

## 2020-04-24 DIAGNOSIS — D485 Neoplasm of uncertain behavior of skin: Secondary | ICD-10-CM | POA: Diagnosis not present

## 2020-04-24 DIAGNOSIS — L814 Other melanin hyperpigmentation: Secondary | ICD-10-CM | POA: Diagnosis not present

## 2020-04-24 DIAGNOSIS — R238 Other skin changes: Secondary | ICD-10-CM | POA: Diagnosis not present

## 2020-04-24 DIAGNOSIS — D229 Melanocytic nevi, unspecified: Secondary | ICD-10-CM | POA: Diagnosis not present

## 2020-04-24 DIAGNOSIS — L609 Nail disorder, unspecified: Secondary | ICD-10-CM | POA: Diagnosis not present

## 2020-04-24 DIAGNOSIS — L738 Other specified follicular disorders: Secondary | ICD-10-CM | POA: Diagnosis not present

## 2020-04-29 DIAGNOSIS — Z20828 Contact with and (suspected) exposure to other viral communicable diseases: Secondary | ICD-10-CM | POA: Diagnosis not present

## 2020-05-20 DIAGNOSIS — L918 Other hypertrophic disorders of the skin: Secondary | ICD-10-CM | POA: Diagnosis not present

## 2020-05-20 DIAGNOSIS — D485 Neoplasm of uncertain behavior of skin: Secondary | ICD-10-CM | POA: Diagnosis not present

## 2020-05-22 DIAGNOSIS — B351 Tinea unguium: Secondary | ICD-10-CM | POA: Diagnosis not present

## 2020-06-12 DIAGNOSIS — N390 Urinary tract infection, site not specified: Secondary | ICD-10-CM | POA: Diagnosis not present

## 2020-06-23 DIAGNOSIS — E890 Postprocedural hypothyroidism: Secondary | ICD-10-CM | POA: Diagnosis not present

## 2020-06-23 DIAGNOSIS — C73 Malignant neoplasm of thyroid gland: Secondary | ICD-10-CM | POA: Diagnosis not present

## 2020-06-26 DIAGNOSIS — H401131 Primary open-angle glaucoma, bilateral, mild stage: Secondary | ICD-10-CM | POA: Diagnosis not present

## 2020-08-04 DIAGNOSIS — R5383 Other fatigue: Secondary | ICD-10-CM | POA: Diagnosis not present

## 2020-08-04 DIAGNOSIS — E785 Hyperlipidemia, unspecified: Secondary | ICD-10-CM | POA: Diagnosis not present

## 2020-08-04 DIAGNOSIS — E119 Type 2 diabetes mellitus without complications: Secondary | ICD-10-CM | POA: Diagnosis not present

## 2020-08-04 DIAGNOSIS — N390 Urinary tract infection, site not specified: Secondary | ICD-10-CM | POA: Diagnosis not present

## 2020-08-04 DIAGNOSIS — M810 Age-related osteoporosis without current pathological fracture: Secondary | ICD-10-CM | POA: Diagnosis not present

## 2020-08-04 DIAGNOSIS — M1990 Unspecified osteoarthritis, unspecified site: Secondary | ICD-10-CM | POA: Diagnosis not present

## 2020-08-12 DIAGNOSIS — E119 Type 2 diabetes mellitus without complications: Secondary | ICD-10-CM | POA: Diagnosis not present

## 2020-08-12 DIAGNOSIS — Z85850 Personal history of malignant neoplasm of thyroid: Secondary | ICD-10-CM | POA: Diagnosis not present

## 2020-08-12 DIAGNOSIS — Z8673 Personal history of transient ischemic attack (TIA), and cerebral infarction without residual deficits: Secondary | ICD-10-CM | POA: Diagnosis not present

## 2020-08-12 DIAGNOSIS — L409 Psoriasis, unspecified: Secondary | ICD-10-CM | POA: Diagnosis not present

## 2020-08-12 DIAGNOSIS — E785 Hyperlipidemia, unspecified: Secondary | ICD-10-CM | POA: Diagnosis not present

## 2020-08-12 DIAGNOSIS — M353 Polymyalgia rheumatica: Secondary | ICD-10-CM | POA: Diagnosis not present

## 2020-08-12 DIAGNOSIS — M0579 Rheumatoid arthritis with rheumatoid factor of multiple sites without organ or systems involvement: Secondary | ICD-10-CM | POA: Diagnosis not present

## 2020-08-12 DIAGNOSIS — Z0000 Encounter for general adult medical examination without abnormal findings: Secondary | ICD-10-CM | POA: Diagnosis not present

## 2020-08-18 DIAGNOSIS — M810 Age-related osteoporosis without current pathological fracture: Secondary | ICD-10-CM | POA: Diagnosis not present

## 2020-08-18 DIAGNOSIS — M1990 Unspecified osteoarthritis, unspecified site: Secondary | ICD-10-CM | POA: Diagnosis not present

## 2020-08-18 DIAGNOSIS — M353 Polymyalgia rheumatica: Secondary | ICD-10-CM | POA: Diagnosis not present

## 2020-08-18 DIAGNOSIS — E785 Hyperlipidemia, unspecified: Secondary | ICD-10-CM | POA: Diagnosis not present

## 2020-08-18 DIAGNOSIS — E1169 Type 2 diabetes mellitus with other specified complication: Secondary | ICD-10-CM | POA: Diagnosis not present

## 2020-08-18 DIAGNOSIS — L609 Nail disorder, unspecified: Secondary | ICD-10-CM | POA: Diagnosis not present

## 2020-08-18 DIAGNOSIS — M79644 Pain in right finger(s): Secondary | ICD-10-CM | POA: Diagnosis not present

## 2020-08-18 DIAGNOSIS — M797 Fibromyalgia: Secondary | ICD-10-CM | POA: Diagnosis not present

## 2020-08-18 DIAGNOSIS — Z7952 Long term (current) use of systemic steroids: Secondary | ICD-10-CM | POA: Diagnosis not present

## 2020-08-22 DIAGNOSIS — H18832 Recurrent erosion of cornea, left eye: Secondary | ICD-10-CM | POA: Diagnosis not present

## 2020-08-22 DIAGNOSIS — H401131 Primary open-angle glaucoma, bilateral, mild stage: Secondary | ICD-10-CM | POA: Diagnosis not present

## 2020-12-16 DIAGNOSIS — M353 Polymyalgia rheumatica: Secondary | ICD-10-CM | POA: Diagnosis not present

## 2020-12-16 DIAGNOSIS — M1990 Unspecified osteoarthritis, unspecified site: Secondary | ICD-10-CM | POA: Diagnosis not present

## 2020-12-16 DIAGNOSIS — M810 Age-related osteoporosis without current pathological fracture: Secondary | ICD-10-CM | POA: Diagnosis not present

## 2020-12-16 DIAGNOSIS — M79644 Pain in right finger(s): Secondary | ICD-10-CM | POA: Diagnosis not present

## 2020-12-16 DIAGNOSIS — E1169 Type 2 diabetes mellitus with other specified complication: Secondary | ICD-10-CM | POA: Diagnosis not present

## 2020-12-16 DIAGNOSIS — Z7952 Long term (current) use of systemic steroids: Secondary | ICD-10-CM | POA: Diagnosis not present

## 2020-12-16 DIAGNOSIS — M797 Fibromyalgia: Secondary | ICD-10-CM | POA: Diagnosis not present

## 2020-12-16 DIAGNOSIS — E785 Hyperlipidemia, unspecified: Secondary | ICD-10-CM | POA: Diagnosis not present

## 2020-12-16 DIAGNOSIS — M25511 Pain in right shoulder: Secondary | ICD-10-CM | POA: Diagnosis not present

## 2020-12-29 DIAGNOSIS — C73 Malignant neoplasm of thyroid gland: Secondary | ICD-10-CM | POA: Diagnosis not present

## 2020-12-29 DIAGNOSIS — E890 Postprocedural hypothyroidism: Secondary | ICD-10-CM | POA: Diagnosis not present

## 2021-02-04 DIAGNOSIS — E119 Type 2 diabetes mellitus without complications: Secondary | ICD-10-CM | POA: Diagnosis not present

## 2021-02-04 DIAGNOSIS — E785 Hyperlipidemia, unspecified: Secondary | ICD-10-CM | POA: Diagnosis not present

## 2021-02-11 DIAGNOSIS — Z85850 Personal history of malignant neoplasm of thyroid: Secondary | ICD-10-CM | POA: Diagnosis not present

## 2021-02-11 DIAGNOSIS — N39498 Other specified urinary incontinence: Secondary | ICD-10-CM | POA: Diagnosis not present

## 2021-02-11 DIAGNOSIS — R351 Nocturia: Secondary | ICD-10-CM | POA: Diagnosis not present

## 2021-02-11 DIAGNOSIS — E785 Hyperlipidemia, unspecified: Secondary | ICD-10-CM | POA: Diagnosis not present

## 2021-02-11 DIAGNOSIS — Z79899 Other long term (current) drug therapy: Secondary | ICD-10-CM | POA: Diagnosis not present

## 2021-02-11 DIAGNOSIS — M810 Age-related osteoporosis without current pathological fracture: Secondary | ICD-10-CM | POA: Diagnosis not present

## 2021-02-11 DIAGNOSIS — E1159 Type 2 diabetes mellitus with other circulatory complications: Secondary | ICD-10-CM | POA: Diagnosis not present

## 2021-02-11 DIAGNOSIS — Z8673 Personal history of transient ischemic attack (TIA), and cerebral infarction without residual deficits: Secondary | ICD-10-CM | POA: Diagnosis not present

## 2021-03-05 DIAGNOSIS — H401131 Primary open-angle glaucoma, bilateral, mild stage: Secondary | ICD-10-CM | POA: Diagnosis not present

## 2021-03-16 ENCOUNTER — Emergency Department (HOSPITAL_COMMUNITY)
Admission: EM | Admit: 2021-03-16 | Discharge: 2021-03-16 | Disposition: A | Discharge status: Home / Self Care | Payer: PPO | Attending: Emergency Medicine | Admitting: Emergency Medicine

## 2021-03-16 ENCOUNTER — Encounter (HOSPITAL_COMMUNITY): Payer: Self-pay | Admitting: *Deleted

## 2021-03-16 DIAGNOSIS — Z87891 Personal history of nicotine dependence: Secondary | ICD-10-CM | POA: Insufficient documentation

## 2021-03-16 DIAGNOSIS — Z7902 Long term (current) use of antithrombotics/antiplatelets: Secondary | ICD-10-CM | POA: Insufficient documentation

## 2021-03-16 DIAGNOSIS — R319 Hematuria, unspecified: Secondary | ICD-10-CM | POA: Diagnosis present

## 2021-03-16 DIAGNOSIS — N3001 Acute cystitis with hematuria: Secondary | ICD-10-CM

## 2021-03-16 DIAGNOSIS — Z96653 Presence of artificial knee joint, bilateral: Secondary | ICD-10-CM | POA: Insufficient documentation

## 2021-03-16 DIAGNOSIS — Z79899 Other long term (current) drug therapy: Secondary | ICD-10-CM | POA: Diagnosis not present

## 2021-03-16 DIAGNOSIS — I10 Essential (primary) hypertension: Secondary | ICD-10-CM | POA: Diagnosis not present

## 2021-03-16 DIAGNOSIS — Z91040 Latex allergy status: Secondary | ICD-10-CM | POA: Insufficient documentation

## 2021-03-16 DIAGNOSIS — E119 Type 2 diabetes mellitus without complications: Secondary | ICD-10-CM | POA: Insufficient documentation

## 2021-03-16 LAB — URINALYSIS, ROUTINE W REFLEX MICROSCOPIC
Bacteria, UA: NONE SEEN
Bilirubin Urine: NEGATIVE
Glucose, UA: NEGATIVE mg/dL
Ketones, ur: NEGATIVE mg/dL
Leukocytes,Ua: NEGATIVE
Nitrite: POSITIVE — AB
Protein, ur: 100 mg/dL — AB
RBC / HPF: >50 RBC/hpf — ABNORMAL HIGH (ref 0–5)
Specific Gravity, Urine: 1.027 (ref 1.005–1.030)
pH: 5 (ref 5.0–8.0)

## 2021-03-16 MED ORDER — CEPHALEXIN 500 MG PO CAPS: 500.0000 mg | ORAL_CAPSULE | Freq: Four times a day (QID) | ORAL | 0 refills | Status: AC

## 2021-03-16 MED ORDER — CEPHALEXIN 500 MG PO CAPS
500.0000 mg | ORAL_CAPSULE | Freq: Once | ORAL | Status: AC
  Administered 2021-03-16: 500 mg via ORAL
  Filled 2021-03-16: qty 1

## 2021-03-16 NOTE — ED Triage Notes (Signed)
Pt complains of incontinence since last night , urinating blood since this morning.

## 2021-03-16 NOTE — ED Provider Notes (Signed)
[place] DEPT Provider Note   CSN: [id number] Arrival date & time: 03/16/21  0918     History Chief Complaint  Patient presents with   Hematuria    Misty Morris is a 70 y.o. female.  The history is provided by the patient.  Hematuria This is a new problem. The current episode started yesterday. The problem occurs constantly. The problem has not changed since onset.Pertinent negatives include no chest pain, no abdominal pain, no headaches and no shortness of breath. Nothing aggravates the symptoms. Nothing relieves the symptoms. She has tried nothing for the symptoms. The treatment provided no relief.      Past Medical History:  Diagnosis Date   Fibromyalgia    GERD (gastroesophageal reflux disease)    Hypercholesterolemia    Hypertension    Polymyalgia rheumatica (HCC)    Rheumatoid arthritis ([place])    "atypical/MD 08/2017" (02/14/2018)   Shingles    Stroke ([place]) 08/2017   "no residual" (02/14/2018)   TIA (transient ischemic attack) 02/14/2018   Type 2 diabetes, diet controlled ([place])    Vision impairment    right eye    Patient Active Problem List   Diagnosis Date Noted   UTI (urinary tract infection) 10/07/2018   Sepsis ([place]) 10/07/2018   Hypokalemia 10/07/2018   S/P total thyroidectomy 10/07/2018   Rheumatoid arthritis ([place]) 09/18/2018   Papillary thyroid carcinoma ([place]) 08/24/2018   TIA (transient ischemic attack) 02/14/2018   Hyperlipidemia    Ischemic optic neuropathy of right eye    Stroke ([place]) 09/04/2017   HTN (hypertension) 09/04/2017   DM (diabetes mellitus) ([place]) 09/04/2017   Polymyalgia rheumatica ([place]) 09/04/2017   Fibromyalgia 09/04/2017   S/P left UKR 01/17/2017   Overweight (BMI 25.0-29.9) 10/01/2015   S/P right TKA 09/30/2015    Past Surgical History:  Procedure Laterality Date   CATARACT EXTRACTION W/ INTRAOCULAR LENS  IMPLANT, BILATERAL     COLONOSCOPY WITH PROPOFOL N/A 10/12/2019   Procedure: COLONOSCOPY  WITH PROPOFOL;  Surgeon: Carol Ada, MD;  [facility]: [facility];  Service: Endoscopy;  Laterality: N/A;   HEMOSTASIS CLIP PLACEMENT  10/12/2019   Procedure: HEMOSTASIS CLIP PLACEMENT;  Surgeon: Carol Ada, MD;  [facility]: [facility];  Service: Endoscopy;;   JOINT REPLACEMENT     OPEN REDUCTION LE FORT I FRACTURE  1980s   PARTIAL KNEE ARTHROPLASTY Left 01/17/2017   Procedure: UNICOMPARTMENTAL LEFT KNEE;  Surgeon: Paralee Cancel, MD;  Location: [facility];  Service: Orthopedics;  Laterality: Left;   POLYPECTOMY  10/12/2019   Procedure: POLYPECTOMY;  Surgeon: Carol Ada, MD;  [facility];  Service: Endoscopy;;   RADIOLOGY WITH ANESTHESIA N/A 08/11/2017   Procedure: MRI OF BRAIN WITH AND WITHOUT CONSTRAST, AND OF THE ORBIT WITH AND WITHOUT CONTRAST;  Surgeon: Radiologist, Medication, MD;  Location: [facility];  Service: Radiology;  Laterality: N/A;   RADIOLOGY WITH ANESTHESIA N/A 09/06/2017   Procedure: MRI OF BRAIN;  Surgeon: Radiologist, Medication, MD;  Location: [facility];  Service: Radiology;  Laterality: N/A;   RHINOPLASTY     TOTAL KNEE ARTHROPLASTY Right 09/30/2015   Procedure: TOTAL RIGHT KNEE ARTHROPLASTY;  Surgeon: Paralee Cancel, MD;  [facility];  Service: Orthopedics;  Laterality: Right;   VAGINAL HYSTERECTOMY       OB History   No obstetric history on file.     Family History  Problem Relation Age of Onset   Hypertension Mother    Seizures Mother    Hypertension Father    Hypertension Sister  Pancreatic cancer Other     Social History   Tobacco Use   Smoking status: Former    Packs/day: 1.00    Years: 25.00    Pack years: 25.00    Types: Cigarettes    Quit date: 12/27/2011    Years since quitting: 9.2   Smokeless tobacco: Never  Vaping Use   Vaping Use: Never used  Substance Use Topics   Alcohol use: Never   Drug use: Yes    Types: Marijuana    Comment: 02/14/2018 "2-3 times/wk"    Home Medications Prior to Admission medications   Medication Sig  Start Date End Date Taking? Authorizing Provider  Acetylcarn-Alpha Lipoic Acid 400-200 MG CAPS Take 1 capsule by mouth 2 (two) times daily.    [provider]  alendronate (FOSAMAX) 70 MG tablet Take 70 mg by mouth every Friday. Take with a full glass of water on an empty stomach.    [provider]  amLODipine (NORVASC) 5 MG tablet Take 5 mg by mouth every evening.  08/20/15   [provider]  Calcium-Magnesium-Vitamin D (CALCIUM 1200+D3 PO) Take 1 tablet by mouth at bedtime.    [provider]  cholecalciferol (VITAMIN D3) 25 MCG (1000 UNIT) tablet Take 2,000 Units by mouth daily.    [provider]  Chromium Picolinate 200 MCG CAPS Take 200 mcg by mouth daily.    [provider]  clopidogrel (PLAVIX) 75 MG tablet Take 1 tablet (75 mg total) by mouth daily. 02/16/18   Bonnielee Haff, MD  Coenzyme Q10 (COQ-10) 100 MG CAPS Take 100 mg by mouth daily.    [provider]  diphenhydrAMINE (BENADRYL) 25 MG tablet Take 25 mg by mouth at bedtime.    [provider]  levothyroxine (SYNTHROID) 125 MCG tablet Take 125 mcg by mouth daily before breakfast.    [provider]  LYSINE PO Take 650 mg by mouth daily.    [provider]  moxifloxacin (VIGAMOX) 0.5 % ophthalmic solution Place 1 drop into the left eye daily.    [provider]  Polyvinyl Alcohol-Povidone (REFRESH OP) Place 1 drop into the left eye in the morning, at noon, and at bedtime.    [provider]  predniSONE (DELTASONE) 1 MG tablet Take 1 mg by mouth every evening. Take with 5 mg to equal 6 mg once daily    [provider]  predniSONE (DELTASONE) 5 MG tablet Take 5 mg by mouth every evening. Take with 1 mg to equal 6 mg once daily    [provider]  rosuvastatin (CRESTOR) 20 MG tablet Take 1 tablet (20 mg total) by mouth daily at 6 PM. 02/15/18   Bonnielee Haff, MD  timolol (TIMOPTIC) 0.25 % ophthalmic solution Place  1 drop into both eyes daily.    [provider]  ZIOPTAN 0.0015 % SOLN Place 1 drop into both eyes at bedtime. 07/23/15   [provider]    Allergies    Codeine, Demerol [meperidine], Latex, Morphine and related, Sulfa antibiotics, Tramadol, and Vicodin [hydrocodone-acetaminophen]  Review of Systems   Review of Systems  Constitutional:  Positive for appetite change (lack of appetite this am). Negative for chills and fever.  HENT:  Negative for ear pain and sore throat.   Eyes:  Negative for pain and visual disturbance.  Respiratory:  Negative for cough and shortness of breath.   Cardiovascular:  Negative for chest pain and palpitations.  Gastrointestinal:  Negative for abdominal pain and  vomiting.  Genitourinary:  Positive for dysuria, frequency and hematuria.       One episode of urinary incontinence  Musculoskeletal:  Negative for arthralgias and back pain.  Skin:  Negative for color change and rash.  Neurological:  Negative for seizures, syncope and headaches.  All other systems reviewed and are negative.  Physical Exam Updated Vital Signs BP (!) 168/108 (BP Location: Right Arm)   Pulse 91   Temp 98.5 F (36.9 C) (Oral)   Resp 17   SpO2 98%   Physical Exam Vitals and nursing note reviewed.  HENT:     Head: Normocephalic and atraumatic.  Eyes:     General: No scleral icterus. Pulmonary:     Effort: Pulmonary effort is normal. No respiratory distress.  Abdominal:     Tenderness: There is no abdominal tenderness. There is no right CVA tenderness, left CVA tenderness or guarding.  Musculoskeletal:     Cervical back: Normal range of motion.  Skin:    General: Skin is warm and dry.  Neurological:     Mental Status: She is alert.  Psychiatric:        Mood and Affect: Mood normal.    ED Results / Procedures / Treatments   Labs (all labs ordered are listed, but only abnormal results are displayed) Labs Reviewed  URINALYSIS, ROUTINE W REFLEX  MICROSCOPIC - Abnormal; Notable for the following components:      Result Value   Color, Urine RED (*)    APPearance CLOUDY (*)    Hgb urine dipstick LARGE (*)    Protein, ur 100 (*)    Nitrite POSITIVE (*)    RBC / HPF >50 (*)    All other components within normal limits  URINE CULTURE    EKG None  Radiology No results found.  Procedures Procedures   Medications Ordered in ED Medications  cephALEXin (KEFLEX) capsule 500 mg (has no administration in time range)    ED Course  I have reviewed the triage vital signs and the nursing notes.  Pertinent labs & imaging results that were available during my care of the patient were reviewed by me and considered in my medical decision making (see chart for details).    MDM Rules/Calculators/A&P                           Misty Morris presents with symptoms of a urinary tract infection.  She is otherwise well-appearing although mildly hypertensive.  No clinical evidence of pyelonephritis.  Urinalysis was consistent with a UTI.  Culture was sent.  She will be prescribed Keflex.  Careful return precautions were given. Final Clinical Impression(s) / ED Diagnoses Final diagnoses:  Acute cystitis with hematuria    Rx / DC Orders ED Discharge Orders          Ordered    cephALEXin (KEFLEX) 500 MG capsule  4 times daily        03/16/21 1034             Arnaldo Natal, MD 03/16/21 1035

## 2021-03-17 LAB — URINE CULTURE: Culture: <10000 — AB

## 2021-04-07 DIAGNOSIS — N390 Urinary tract infection, site not specified: Secondary | ICD-10-CM | POA: Diagnosis not present

## 2021-04-20 DIAGNOSIS — N952 Postmenopausal atrophic vaginitis: Secondary | ICD-10-CM | POA: Diagnosis not present

## 2021-04-20 DIAGNOSIS — R350 Frequency of micturition: Secondary | ICD-10-CM | POA: Diagnosis not present

## 2021-04-20 DIAGNOSIS — R351 Nocturia: Secondary | ICD-10-CM | POA: Diagnosis not present

## 2021-04-20 DIAGNOSIS — N3000 Acute cystitis without hematuria: Secondary | ICD-10-CM | POA: Diagnosis not present

## 2021-04-20 DIAGNOSIS — R3915 Urgency of urination: Secondary | ICD-10-CM | POA: Diagnosis not present

## 2021-04-21 DIAGNOSIS — M353 Polymyalgia rheumatica: Secondary | ICD-10-CM | POA: Diagnosis not present

## 2021-04-21 DIAGNOSIS — M810 Age-related osteoporosis without current pathological fracture: Secondary | ICD-10-CM | POA: Diagnosis not present

## 2021-04-21 DIAGNOSIS — M797 Fibromyalgia: Secondary | ICD-10-CM | POA: Diagnosis not present

## 2021-04-21 DIAGNOSIS — E1169 Type 2 diabetes mellitus with other specified complication: Secondary | ICD-10-CM | POA: Diagnosis not present

## 2021-04-21 DIAGNOSIS — Z7952 Long term (current) use of systemic steroids: Secondary | ICD-10-CM | POA: Diagnosis not present

## 2021-04-21 DIAGNOSIS — E785 Hyperlipidemia, unspecified: Secondary | ICD-10-CM | POA: Diagnosis not present

## 2021-04-21 DIAGNOSIS — M1990 Unspecified osteoarthritis, unspecified site: Secondary | ICD-10-CM | POA: Diagnosis not present

## 2021-04-21 DIAGNOSIS — M79644 Pain in right finger(s): Secondary | ICD-10-CM | POA: Diagnosis not present

## 2021-04-21 DIAGNOSIS — M2550 Pain in unspecified joint: Secondary | ICD-10-CM | POA: Diagnosis not present

## 2021-06-08 DIAGNOSIS — M810 Age-related osteoporosis without current pathological fracture: Secondary | ICD-10-CM | POA: Diagnosis not present

## 2021-06-08 DIAGNOSIS — M0579 Rheumatoid arthritis with rheumatoid factor of multiple sites without organ or systems involvement: Secondary | ICD-10-CM | POA: Diagnosis not present

## 2021-06-08 DIAGNOSIS — E1159 Type 2 diabetes mellitus with other circulatory complications: Secondary | ICD-10-CM | POA: Diagnosis not present

## 2021-06-08 DIAGNOSIS — E785 Hyperlipidemia, unspecified: Secondary | ICD-10-CM | POA: Diagnosis not present

## 2021-06-16 DIAGNOSIS — R351 Nocturia: Secondary | ICD-10-CM | POA: Diagnosis not present

## 2021-06-16 DIAGNOSIS — R8279 Other abnormal findings on microbiological examination of urine: Secondary | ICD-10-CM | POA: Diagnosis not present

## 2021-06-16 DIAGNOSIS — R350 Frequency of micturition: Secondary | ICD-10-CM | POA: Diagnosis not present

## 2021-06-16 DIAGNOSIS — R3915 Urgency of urination: Secondary | ICD-10-CM | POA: Diagnosis not present

## 2021-06-16 DIAGNOSIS — N952 Postmenopausal atrophic vaginitis: Secondary | ICD-10-CM | POA: Diagnosis not present

## 2021-07-06 DIAGNOSIS — Z85850 Personal history of malignant neoplasm of thyroid: Secondary | ICD-10-CM | POA: Diagnosis not present

## 2021-07-06 DIAGNOSIS — E890 Postprocedural hypothyroidism: Secondary | ICD-10-CM | POA: Diagnosis not present

## 2021-07-06 DIAGNOSIS — C73 Malignant neoplasm of thyroid gland: Secondary | ICD-10-CM | POA: Diagnosis not present

## 2021-08-13 DIAGNOSIS — E1159 Type 2 diabetes mellitus with other circulatory complications: Secondary | ICD-10-CM | POA: Diagnosis not present

## 2021-08-13 DIAGNOSIS — E785 Hyperlipidemia, unspecified: Secondary | ICD-10-CM | POA: Diagnosis not present

## 2021-08-13 DIAGNOSIS — Z79899 Other long term (current) drug therapy: Secondary | ICD-10-CM | POA: Diagnosis not present

## 2021-08-13 DIAGNOSIS — Z8673 Personal history of transient ischemic attack (TIA), and cerebral infarction without residual deficits: Secondary | ICD-10-CM | POA: Diagnosis not present

## 2021-08-13 DIAGNOSIS — M810 Age-related osteoporosis without current pathological fracture: Secondary | ICD-10-CM | POA: Diagnosis not present

## 2021-08-13 DIAGNOSIS — N39498 Other specified urinary incontinence: Secondary | ICD-10-CM | POA: Diagnosis not present

## 2021-08-20 DIAGNOSIS — E785 Hyperlipidemia, unspecified: Secondary | ICD-10-CM | POA: Diagnosis not present

## 2021-08-20 DIAGNOSIS — Z85850 Personal history of malignant neoplasm of thyroid: Secondary | ICD-10-CM | POA: Diagnosis not present

## 2021-08-20 DIAGNOSIS — Z79899 Other long term (current) drug therapy: Secondary | ICD-10-CM | POA: Diagnosis not present

## 2021-08-20 DIAGNOSIS — E1159 Type 2 diabetes mellitus with other circulatory complications: Secondary | ICD-10-CM | POA: Diagnosis not present

## 2021-08-20 DIAGNOSIS — Z0000 Encounter for general adult medical examination without abnormal findings: Secondary | ICD-10-CM | POA: Diagnosis not present

## 2021-08-20 DIAGNOSIS — M353 Polymyalgia rheumatica: Secondary | ICD-10-CM | POA: Diagnosis not present

## 2021-08-20 DIAGNOSIS — M810 Age-related osteoporosis without current pathological fracture: Secondary | ICD-10-CM | POA: Diagnosis not present

## 2021-08-20 DIAGNOSIS — Z8673 Personal history of transient ischemic attack (TIA), and cerebral infarction without residual deficits: Secondary | ICD-10-CM | POA: Diagnosis not present

## 2021-08-20 DIAGNOSIS — M1990 Unspecified osteoarthritis, unspecified site: Secondary | ICD-10-CM | POA: Diagnosis not present

## 2021-08-24 DIAGNOSIS — M353 Polymyalgia rheumatica: Secondary | ICD-10-CM | POA: Diagnosis not present

## 2021-08-24 DIAGNOSIS — M79644 Pain in right finger(s): Secondary | ICD-10-CM | POA: Diagnosis not present

## 2021-08-24 DIAGNOSIS — M8589 Other specified disorders of bone density and structure, multiple sites: Secondary | ICD-10-CM | POA: Diagnosis not present

## 2021-08-24 DIAGNOSIS — Z7952 Long term (current) use of systemic steroids: Secondary | ICD-10-CM | POA: Diagnosis not present

## 2021-08-24 DIAGNOSIS — M1990 Unspecified osteoarthritis, unspecified site: Secondary | ICD-10-CM | POA: Diagnosis not present

## 2021-08-24 DIAGNOSIS — E785 Hyperlipidemia, unspecified: Secondary | ICD-10-CM | POA: Diagnosis not present

## 2021-08-24 DIAGNOSIS — E1169 Type 2 diabetes mellitus with other specified complication: Secondary | ICD-10-CM | POA: Diagnosis not present

## 2021-08-24 DIAGNOSIS — M810 Age-related osteoporosis without current pathological fracture: Secondary | ICD-10-CM | POA: Diagnosis not present

## 2021-08-24 DIAGNOSIS — M2550 Pain in unspecified joint: Secondary | ICD-10-CM | POA: Diagnosis not present

## 2021-08-24 DIAGNOSIS — M797 Fibromyalgia: Secondary | ICD-10-CM | POA: Diagnosis not present

## 2021-09-07 DIAGNOSIS — H401131 Primary open-angle glaucoma, bilateral, mild stage: Secondary | ICD-10-CM | POA: Diagnosis not present

## 2021-09-07 DIAGNOSIS — Z961 Presence of intraocular lens: Secondary | ICD-10-CM | POA: Diagnosis not present

## 2021-09-07 DIAGNOSIS — H18832 Recurrent erosion of cornea, left eye: Secondary | ICD-10-CM | POA: Diagnosis not present

## 2021-11-14 DIAGNOSIS — J9801 Acute bronchospasm: Secondary | ICD-10-CM | POA: Diagnosis not present

## 2021-11-14 DIAGNOSIS — I10 Essential (primary) hypertension: Secondary | ICD-10-CM | POA: Diagnosis not present

## 2021-11-14 DIAGNOSIS — H6692 Otitis media, unspecified, left ear: Secondary | ICD-10-CM | POA: Diagnosis not present

## 2021-12-16 DIAGNOSIS — R350 Frequency of micturition: Secondary | ICD-10-CM | POA: Diagnosis not present

## 2021-12-16 DIAGNOSIS — N952 Postmenopausal atrophic vaginitis: Secondary | ICD-10-CM | POA: Diagnosis not present

## 2021-12-16 DIAGNOSIS — N3000 Acute cystitis without hematuria: Secondary | ICD-10-CM | POA: Diagnosis not present

## 2021-12-17 DIAGNOSIS — M0579 Rheumatoid arthritis with rheumatoid factor of multiple sites without organ or systems involvement: Secondary | ICD-10-CM | POA: Diagnosis not present

## 2021-12-21 DIAGNOSIS — M810 Age-related osteoporosis without current pathological fracture: Secondary | ICD-10-CM | POA: Diagnosis not present

## 2021-12-21 DIAGNOSIS — M353 Polymyalgia rheumatica: Secondary | ICD-10-CM | POA: Diagnosis not present

## 2021-12-21 DIAGNOSIS — M797 Fibromyalgia: Secondary | ICD-10-CM | POA: Diagnosis not present

## 2021-12-21 DIAGNOSIS — M2550 Pain in unspecified joint: Secondary | ICD-10-CM | POA: Diagnosis not present

## 2021-12-21 DIAGNOSIS — M79644 Pain in right finger(s): Secondary | ICD-10-CM | POA: Diagnosis not present

## 2021-12-21 DIAGNOSIS — E1169 Type 2 diabetes mellitus with other specified complication: Secondary | ICD-10-CM | POA: Diagnosis not present

## 2021-12-21 DIAGNOSIS — M1990 Unspecified osteoarthritis, unspecified site: Secondary | ICD-10-CM | POA: Diagnosis not present

## 2021-12-21 DIAGNOSIS — Z7952 Long term (current) use of systemic steroids: Secondary | ICD-10-CM | POA: Diagnosis not present

## 2021-12-21 DIAGNOSIS — E785 Hyperlipidemia, unspecified: Secondary | ICD-10-CM | POA: Diagnosis not present

## 2022-01-11 DIAGNOSIS — C73 Malignant neoplasm of thyroid gland: Secondary | ICD-10-CM | POA: Diagnosis not present

## 2022-01-11 DIAGNOSIS — Z85850 Personal history of malignant neoplasm of thyroid: Secondary | ICD-10-CM | POA: Diagnosis not present

## 2022-01-11 DIAGNOSIS — E890 Postprocedural hypothyroidism: Secondary | ICD-10-CM | POA: Diagnosis not present

## 2022-01-11 DIAGNOSIS — Z79890 Hormone replacement therapy (postmenopausal): Secondary | ICD-10-CM | POA: Diagnosis not present

## 2022-01-19 DIAGNOSIS — R490 Dysphonia: Secondary | ICD-10-CM | POA: Diagnosis not present

## 2022-01-19 DIAGNOSIS — H938X3 Other specified disorders of ear, bilateral: Secondary | ICD-10-CM | POA: Diagnosis not present

## 2022-01-29 DIAGNOSIS — M0550 Rheumatoid polyneuropathy with rheumatoid arthritis of unspecified site: Secondary | ICD-10-CM | POA: Diagnosis not present

## 2022-01-29 DIAGNOSIS — Z85850 Personal history of malignant neoplasm of thyroid: Secondary | ICD-10-CM | POA: Diagnosis not present

## 2022-01-29 DIAGNOSIS — I129 Hypertensive chronic kidney disease with stage 1 through stage 4 chronic kidney disease, or unspecified chronic kidney disease: Secondary | ICD-10-CM | POA: Diagnosis not present

## 2022-01-29 DIAGNOSIS — E1122 Type 2 diabetes mellitus with diabetic chronic kidney disease: Secondary | ICD-10-CM | POA: Diagnosis not present

## 2022-01-29 DIAGNOSIS — Z87891 Personal history of nicotine dependence: Secondary | ICD-10-CM | POA: Diagnosis not present

## 2022-01-29 DIAGNOSIS — N183 Chronic kidney disease, stage 3 unspecified: Secondary | ICD-10-CM | POA: Diagnosis not present

## 2022-02-18 DIAGNOSIS — M353 Polymyalgia rheumatica: Secondary | ICD-10-CM | POA: Diagnosis not present

## 2022-02-18 DIAGNOSIS — E1159 Type 2 diabetes mellitus with other circulatory complications: Secondary | ICD-10-CM | POA: Diagnosis not present

## 2022-02-18 DIAGNOSIS — Z85850 Personal history of malignant neoplasm of thyroid: Secondary | ICD-10-CM | POA: Diagnosis not present

## 2022-02-18 DIAGNOSIS — E785 Hyperlipidemia, unspecified: Secondary | ICD-10-CM | POA: Diagnosis not present

## 2022-02-18 DIAGNOSIS — Z87898 Personal history of other specified conditions: Secondary | ICD-10-CM | POA: Diagnosis not present

## 2022-02-18 DIAGNOSIS — Z8673 Personal history of transient ischemic attack (TIA), and cerebral infarction without residual deficits: Secondary | ICD-10-CM | POA: Diagnosis not present

## 2022-02-18 DIAGNOSIS — M0579 Rheumatoid arthritis with rheumatoid factor of multiple sites without organ or systems involvement: Secondary | ICD-10-CM | POA: Diagnosis not present

## 2022-03-02 DIAGNOSIS — R490 Dysphonia: Secondary | ICD-10-CM | POA: Diagnosis not present

## 2022-03-04 DIAGNOSIS — H401132 Primary open-angle glaucoma, bilateral, moderate stage: Secondary | ICD-10-CM | POA: Diagnosis not present

## 2022-03-12 DIAGNOSIS — Z0289 Encounter for other administrative examinations: Secondary | ICD-10-CM | POA: Diagnosis not present

## 2022-04-23 DIAGNOSIS — M79644 Pain in right finger(s): Secondary | ICD-10-CM | POA: Diagnosis not present

## 2022-04-23 DIAGNOSIS — M797 Fibromyalgia: Secondary | ICD-10-CM | POA: Diagnosis not present

## 2022-04-23 DIAGNOSIS — M1990 Unspecified osteoarthritis, unspecified site: Secondary | ICD-10-CM | POA: Diagnosis not present

## 2022-04-23 DIAGNOSIS — M2550 Pain in unspecified joint: Secondary | ICD-10-CM | POA: Diagnosis not present

## 2022-04-23 DIAGNOSIS — M810 Age-related osteoporosis without current pathological fracture: Secondary | ICD-10-CM | POA: Diagnosis not present

## 2022-04-23 DIAGNOSIS — M353 Polymyalgia rheumatica: Secondary | ICD-10-CM | POA: Diagnosis not present

## 2022-04-23 DIAGNOSIS — E1169 Type 2 diabetes mellitus with other specified complication: Secondary | ICD-10-CM | POA: Diagnosis not present

## 2022-04-23 DIAGNOSIS — Z7952 Long term (current) use of systemic steroids: Secondary | ICD-10-CM | POA: Diagnosis not present

## 2022-04-23 DIAGNOSIS — E785 Hyperlipidemia, unspecified: Secondary | ICD-10-CM | POA: Diagnosis not present

## 2022-06-04 DIAGNOSIS — T881XXA Other complications following immunization, not elsewhere classified, initial encounter: Secondary | ICD-10-CM | POA: Diagnosis not present

## 2022-08-23 DIAGNOSIS — M797 Fibromyalgia: Secondary | ICD-10-CM | POA: Diagnosis not present

## 2022-08-23 DIAGNOSIS — Z7952 Long term (current) use of systemic steroids: Secondary | ICD-10-CM | POA: Diagnosis not present

## 2022-08-23 DIAGNOSIS — M810 Age-related osteoporosis without current pathological fracture: Secondary | ICD-10-CM | POA: Diagnosis not present

## 2022-08-23 DIAGNOSIS — M2550 Pain in unspecified joint: Secondary | ICD-10-CM | POA: Diagnosis not present

## 2022-08-23 DIAGNOSIS — M1990 Unspecified osteoarthritis, unspecified site: Secondary | ICD-10-CM | POA: Diagnosis not present

## 2022-08-23 DIAGNOSIS — E785 Hyperlipidemia, unspecified: Secondary | ICD-10-CM | POA: Diagnosis not present

## 2022-08-23 DIAGNOSIS — M353 Polymyalgia rheumatica: Secondary | ICD-10-CM | POA: Diagnosis not present

## 2022-08-25 DIAGNOSIS — E1159 Type 2 diabetes mellitus with other circulatory complications: Secondary | ICD-10-CM | POA: Diagnosis not present

## 2022-08-25 DIAGNOSIS — E785 Hyperlipidemia, unspecified: Secondary | ICD-10-CM | POA: Diagnosis not present

## 2022-08-25 DIAGNOSIS — Z85850 Personal history of malignant neoplasm of thyroid: Secondary | ICD-10-CM | POA: Diagnosis not present

## 2022-08-25 DIAGNOSIS — M0579 Rheumatoid arthritis with rheumatoid factor of multiple sites without organ or systems involvement: Secondary | ICD-10-CM | POA: Diagnosis not present

## 2022-08-25 DIAGNOSIS — M1990 Unspecified osteoarthritis, unspecified site: Secondary | ICD-10-CM | POA: Diagnosis not present

## 2022-08-25 DIAGNOSIS — Z8673 Personal history of transient ischemic attack (TIA), and cerebral infarction without residual deficits: Secondary | ICD-10-CM | POA: Diagnosis not present

## 2022-08-25 DIAGNOSIS — Z87898 Personal history of other specified conditions: Secondary | ICD-10-CM | POA: Diagnosis not present

## 2022-08-25 DIAGNOSIS — M353 Polymyalgia rheumatica: Secondary | ICD-10-CM | POA: Diagnosis not present

## 2022-08-30 DIAGNOSIS — E890 Postprocedural hypothyroidism: Secondary | ICD-10-CM | POA: Diagnosis not present

## 2022-08-30 DIAGNOSIS — C73 Malignant neoplasm of thyroid gland: Secondary | ICD-10-CM | POA: Diagnosis not present

## 2022-09-01 DIAGNOSIS — Z0000 Encounter for general adult medical examination without abnormal findings: Secondary | ICD-10-CM | POA: Diagnosis not present

## 2022-09-01 DIAGNOSIS — E559 Vitamin D deficiency, unspecified: Secondary | ICD-10-CM | POA: Diagnosis not present

## 2022-09-01 DIAGNOSIS — H3090 Unspecified chorioretinal inflammation, unspecified eye: Secondary | ICD-10-CM | POA: Diagnosis not present

## 2022-09-01 DIAGNOSIS — E1159 Type 2 diabetes mellitus with other circulatory complications: Secondary | ICD-10-CM | POA: Diagnosis not present

## 2022-09-01 DIAGNOSIS — M0579 Rheumatoid arthritis with rheumatoid factor of multiple sites without organ or systems involvement: Secondary | ICD-10-CM | POA: Diagnosis not present

## 2022-09-01 DIAGNOSIS — L409 Psoriasis, unspecified: Secondary | ICD-10-CM | POA: Diagnosis not present

## 2022-09-01 DIAGNOSIS — Z85850 Personal history of malignant neoplasm of thyroid: Secondary | ICD-10-CM | POA: Diagnosis not present

## 2022-09-01 DIAGNOSIS — M353 Polymyalgia rheumatica: Secondary | ICD-10-CM | POA: Diagnosis not present

## 2022-09-01 DIAGNOSIS — N39498 Other specified urinary incontinence: Secondary | ICD-10-CM | POA: Diagnosis not present

## 2022-09-01 DIAGNOSIS — E781 Pure hyperglyceridemia: Secondary | ICD-10-CM | POA: Diagnosis not present

## 2022-09-06 DIAGNOSIS — H52203 Unspecified astigmatism, bilateral: Secondary | ICD-10-CM | POA: Diagnosis not present

## 2022-09-06 DIAGNOSIS — Z961 Presence of intraocular lens: Secondary | ICD-10-CM | POA: Diagnosis not present

## 2022-09-06 DIAGNOSIS — H401131 Primary open-angle glaucoma, bilateral, mild stage: Secondary | ICD-10-CM | POA: Diagnosis not present

## 2022-09-15 DIAGNOSIS — M79601 Pain in right arm: Secondary | ICD-10-CM | POA: Diagnosis not present

## 2022-09-22 DIAGNOSIS — I10 Essential (primary) hypertension: Secondary | ICD-10-CM | POA: Diagnosis not present

## 2022-09-22 DIAGNOSIS — Z86010 Personal history of colonic polyps: Secondary | ICD-10-CM | POA: Diagnosis not present

## 2022-09-22 DIAGNOSIS — I6789 Other cerebrovascular disease: Secondary | ICD-10-CM | POA: Diagnosis not present

## 2022-10-04 ENCOUNTER — Other Ambulatory Visit: Payer: Self-pay | Admitting: Gastroenterology

## 2022-10-11 NOTE — Therapy (Signed)
OUTPATIENT PHYSICAL THERAPY LOWER EXTREMITY EVALUATION   Patient Name: Misty Morris MRN: [id number] DOB:06/08/1951, 72 y.o., female Today's Date: 10/13/2022  END OF SESSION:  PT End of Session - 10/12/22 1421     Visit Number 1    Number of Visits 12    Date for PT Re-Evaluation 12/21/22   due to scheduling availability for the pool at the clinic   Authorization Type HTA    PT Start Time 1317    PT Stop Time 1414    PT Time Calculation (min) 57 min    Activity Tolerance Patient tolerated treatment well    Behavior During Therapy [facility] for tasks assessed/performed             Past Medical History:  Diagnosis Date   Fibromyalgia    GERD (gastroesophageal reflux disease)    Hypercholesterolemia    Hypertension    Polymyalgia rheumatica (HCC)    Rheumatoid arthritis (HCC)    "atypical/MD 08/2017" (02/14/2018)   Shingles    Stroke (HCC) 08/2017   "no residual" (02/14/2018)   TIA (transient ischemic attack) 02/14/2018   Type 2 diabetes, diet controlled (HCC)    Vision impairment    right eye   Past Surgical History:  Procedure Laterality Date   CATARACT EXTRACTION W/ INTRAOCULAR LENS  IMPLANT, BILATERAL     COLONOSCOPY WITH PROPOFOL N/A 10/12/2019   Procedure: COLONOSCOPY WITH PROPOFOL;  Surgeon: Jeani Hawking, MD;  [facility];  Service: Endoscopy;  Laterality: N/A;   HEMOSTASIS CLIP PLACEMENT  10/12/2019   Procedure: HEMOSTASIS CLIP PLACEMENT;  Surgeon: Jeani Hawking, MD;  [facility];  Service: Endoscopy;;   JOINT REPLACEMENT     OPEN REDUCTION LE FORT I FRACTURE  1980s   PARTIAL KNEE ARTHROPLASTY Left 01/17/2017   Procedure: UNICOMPARTMENTAL LEFT KNEE;  Surgeon: Durene Romans, MD;  [facility];  Service: Orthopedics;  Laterality: Left;   POLYPECTOMY  10/12/2019   Procedure: POLYPECTOMY;  Surgeon: Jeani Hawking, MD;  [facility];  Service: Endoscopy;;   RADIOLOGY WITH ANESTHESIA N/A 08/11/2017   Procedure: MRI OF BRAIN WITH AND WITHOUT  CONSTRAST, AND OF THE ORBIT WITH AND WITHOUT CONTRAST;  Surgeon: Radiologist, Medication, MD;  Location: [facility];  Service: Radiology;  Laterality: N/A;   RADIOLOGY WITH ANESTHESIA N/A 09/06/2017   Procedure: MRI OF BRAIN;  Surgeon: Radiologist, Medication, MD;  Location: [facility];  Service: Radiology;  Laterality: N/A;   RHINOPLASTY     TOTAL KNEE ARTHROPLASTY Right 09/30/2015   Procedure: TOTAL RIGHT KNEE ARTHROPLASTY;  Surgeon: Durene Romans, MD;  [facility];  Service: Orthopedics;  Laterality: Right;   VAGINAL HYSTERECTOMY     Patient Active Problem List   Diagnosis Date Noted   UTI (urinary tract infection) 10/07/2018   Sepsis (HCC) 10/07/2018   Hypokalemia 10/07/2018   S/P total thyroidectomy 10/07/2018   Rheumatoid arthritis (HCC) 09/18/2018   Papillary thyroid carcinoma (HCC) 08/24/2018   TIA (transient ischemic attack) 02/14/2018   Hyperlipidemia    Ischemic optic neuropathy of right eye    Stroke (HCC) 09/04/2017   HTN (hypertension) 09/04/2017   DM (diabetes mellitus) (HCC) 09/04/2017   Polymyalgia rheumatica (HCC) 09/04/2017   Fibromyalgia 09/04/2017   S/P left UKR 01/17/2017   Overweight (BMI 25.0-29.9) 10/01/2015   S/P right TKA 09/30/2015    PCP: Irena Reichmann, DO  REFERRING PROVIDER: Casimer Lanius, MD  REFERRING DIAG: M79.7 (ICD-10-CM) - Fibromyalgia  THERAPY DIAG:  Bilateral shoulder pain, unspecified chronicity  Other low back  pain  Muscle weakness (generalized)  Other lack of coordination  Rationale for Evaluation and Treatment: Rehabilitation  ONSET DATE: PT order 09/02/2022  SUBJECTIVE:   SUBJECTIVE STATEMENT: Pt states her sx's worsened over the past couple of years.  Pt saw MD on 09/02/22 and he ordered PT.  PT order indicated aquatic therapy.  Pt hasn't received PT for fibromyalgia prior though has had PT for her knee replacements and also for CVA.  Pt was [facility] the senior center and taking exercises classes including arthritis and balance  classes.  They don't have the arthritis class anymore.  Pt goes to the senior center 4 days/wk and performs the recumbent bike, resistance machines, and free weights.  "I'm tired of hurting 24 hrs/day."  Pt reports having constant pain in hands, arms, and shoulders.  The lowest it ever gets to is a 6-7/10.  Pt states her hands don't work good at all.  Her thumbs hurt a lot.  Pt has increased pain and is limited with household chores and cleaning including washing dishes and performing laundry.  Pt states her energy level is low and she has ways to conserve her energy.  She is limited with ambulation due to fatigue.  Pt states she is "pretty much done" by 3:00 PM.  Pain and energy level hinder her from doing her housework.  She can only perform 15-20 mins of housework and then has to sit down.    PERTINENT HISTORY: RA, OA in cervical, hands, and lumbar, Polymyalgia rheumatica, CVA / TIA in 2019, DM type 2, HTN Pt states her balance is a little off and her peripheral vision is off R TKA 2017, partial L knee replacement 2018,  PAIN:  Are you having pain?  Pt states she has pain from head to toe.  Pt denies hip pain.  Pt reports having pain in cervical, lumbar, hands, and bilat arms/shoulders.  Pt states her legs hurt, but not as bad as her arms.   Arms, shoulders, and hands:  6-7/10 current and best.  10/10 worst. Lumbar:  1-2/10 current, 0/10 best, and 7-8/10 worst  PRECAUTIONS: Other: RA, OA, CVA/TIA, balance, TKA  WEIGHT BEARING RESTRICTIONS: No  FALLS:  Has patient fallen in last 6 months? No  LIVING ENVIRONMENT: Lives with: lives alone Lives in: 1 story home Stairs:   2 steps to enter home and a post to hold on to Has following equipment at home: Single point cane and walker  OCCUPATION: Pt is retired.   PLOF: Independent; Pt's sx's have been worsening over the years which affects her mobility and performance of daily activities.   PATIENT GOALS: improved pain, tolerance to activity,  and performance of household chores   OBJECTIVE:   DIAGNOSTIC FINDINGS: N/A  PATIENT SURVEYS:  FOTO 44 with a goal of 52 at visit #11  COGNITION: Overall cognitive status: Within functional limits for tasks assessed      UE ROM: Shoulder flexion and Abduction:  WFL Elbow flexion: WFL   UPPER EXTREMITY STRENGTH:  Strength Right eval Left eval  Shouder flexion 5/5 5/5  Shoulder Abduction 5/5 5/5  Hip abduction    Hip adduction    Shoulder internal rotation [facility]  Shoulder external rotation Weak and painful Stronger on Left though still has weakness  Elbow flexion 5/5 5/5  Elbow extension 4+/5 4+/5  Ankle dorsiflexion    Ankle plantarflexion    Ankle inversion    Ankle eversion     (Blank rows = not tested)  LOWER EXTREMITY MMT:  MMT Right eval Left eval  Hip flexion 5/5 5/5  Hip extension    Hip abduction 4/5 5/5  Hip adduction    Hip internal rotation    Hip external rotation 4+/5 4+/5  Knee flexion 5/5 seated 5/5 seated  Knee extension 5/5 5/5  Ankle dorsiflexion 5/5 5/5  Ankle plantarflexion WFL seated WFL seated  Ankle inversion    Ankle eversion     (Blank rows = not tested)    FUNCTIONAL TESTS:  5x STS test:  17 seconds without Ue's  BALANCE: SLS:  Pt unable to perform. Tandem stance 3-4 sec with SBA, not steady.  GAIT: Comments: Pt ambulates without an AD.  Pt had occasions of unsteadiness that she self corrected.  She has limited arm swing on R, normal on L.    TODAY'S TREATMENT:                                                                                                                                PATIENT EDUCATION:  Education details: Educated pt in aquatic therapy process and aquatic properties and benefits.  Objective findings, dx, relevant anatomy, and POC.  PT answered Pt's questions.   Person educated: Patient Education method: Explanation Education comprehension: verbalized understanding  HOME EXERCISE PROGRAM: Did  not give HEP.  ASSESSMENT:  CLINICAL IMPRESSION: Patient is a 72 y.o. female with a dx of fibromyalgia presenting to the clinic with shoulder/UE pain, lumbago, cervicalgia, muscle weakness, and balance deficits.  PT order indicated aquatic therapy.  Pt has an extensive past medical hx.  Pt c/o's of constant pain and has increased pain and limitations with household chores.  Pt states her energy level is low and she has ways to conserve her energy.  She is limited with ambulation due to fatigue.  Pt has muscle weakness in R > L shoulder ER, bilat hip ER, and R hip abduction.  Pt has balance deficits as evidenced by performance of SLS and tandem stance.  Pt works out at the senior center 4 days/wk.  Pt should benefit from skilled PT services to address impairments, improve tolerance to activity, and improve overall function.     OBJECTIVE IMPAIRMENTS: decreased activity tolerance, decreased balance, decreased endurance, decreased mobility, difficulty walking, decreased strength, impaired UE functional use, and pain.   ACTIVITY LIMITATIONS: carrying, lifting, standing, and locomotion level  PARTICIPATION LIMITATIONS: cleaning, laundry, shopping, and community activity  PERSONAL FACTORS: Time since onset of injury/illness/exacerbation and 3+ comorbidities: RA, OA, Polymyalgia rheumatica, CVA / TIA, DM, balance, R TKA,  and partial L knee replacement   are also affecting patient's functional outcome.   REHAB POTENTIAL: Fair time of onset and multiple co-morbidities  CLINICAL DECISION MAKING: Evolving/moderate complexity  EVALUATION COMPLEXITY: Moderate   GOALS:   SHORT TERM GOALS: Pt will tolerate aquatic therapy without adverse effects for improved tolerance to activity and pain.  Baseline: Goal status: INITIAL Target date: 11/23/2022  2.  Pt will tolerate progression of aquatic exercises without significant pain in order for improved strength, function, and tolerance to activity.   Baseline:  Goal status: INITIAL Target date: 12/07/2022   3.  Pt will perform 5x STS test in no > 12 sec for improved functional LE strength and performance of transfers.  Baseline:  Goal status: INITIAL Target date:  11/30/2022   4.  Pt will report at least a 25% improvement in pain and sx's overall for improved performance of daily activities and daily mobility.  Baseline:  Goal status: INITIAL   LONG TERM GOALS: Target date: 12/21/2022    Pt will perform SLS at least 5 sec bilat for improved proprioception and functional stability with daily mobility.    Baseline:  Goal status: INITIAL  2.  Pt will demonstrate and report improved steadiness with gait.   Baseline:  Goal status: INITIAL  3.  Pt will report at least a 60-70% improvement in pain and sx's overall. Baseline:  Goal status: INITIAL  4.  Pt will be independent with aquatic HEP for improved strength, pain, and function.   Baseline:  Goal status: INITIAL  5.  Pt will report at least a 50% improvement in tolerance with performing household chores.  Baseline:  Goal status: INITIAL     PLAN:  PT FREQUENCY: 2x/week  PT DURATION: other: 10 weeks overall due to schedule availability for the pool though probably 6 weeks of treatment  PLANNED INTERVENTIONS: Therapeutic exercises, Therapeutic activity, Neuromuscular re-education, Balance training, Gait training, Patient/Family education, Self Care, Stair training, Aquatic Therapy, Dry Needling, Electrical stimulation, Cryotherapy, Moist heat, Taping, Ultrasound, Manual therapy, and Re-evaluation  PLAN FOR NEXT SESSION: 3-4 weeks of aquatic therapy and determine if pt needs further aquatic or could benefit from land   [employer] PT, DPT 10/13/22 10:41 PM

## 2022-10-12 ENCOUNTER — Encounter (HOSPITAL_BASED_OUTPATIENT_CLINIC_OR_DEPARTMENT_OTHER): Payer: Self-pay | Admitting: Physical Therapy

## 2022-10-12 ENCOUNTER — Ambulatory Visit (HOSPITAL_BASED_OUTPATIENT_CLINIC_OR_DEPARTMENT_OTHER): Payer: PPO | Attending: Rheumatology | Admitting: Physical Therapy

## 2022-10-12 DIAGNOSIS — M6281 Muscle weakness (generalized): Secondary | ICD-10-CM | POA: Diagnosis not present

## 2022-10-12 DIAGNOSIS — M25511 Pain in right shoulder: Secondary | ICD-10-CM

## 2022-10-12 DIAGNOSIS — R278 Other lack of coordination: Secondary | ICD-10-CM

## 2022-10-12 DIAGNOSIS — M5459 Other low back pain: Secondary | ICD-10-CM

## 2022-10-12 DIAGNOSIS — M25512 Pain in left shoulder: Secondary | ICD-10-CM | POA: Insufficient documentation

## 2022-10-13 ENCOUNTER — Other Ambulatory Visit: Payer: Self-pay

## 2022-11-08 ENCOUNTER — Ambulatory Visit (HOSPITAL_BASED_OUTPATIENT_CLINIC_OR_DEPARTMENT_OTHER): Payer: PPO | Attending: Rheumatology | Admitting: Physical Therapy

## 2022-11-08 ENCOUNTER — Encounter (HOSPITAL_BASED_OUTPATIENT_CLINIC_OR_DEPARTMENT_OTHER): Payer: Self-pay | Admitting: Physical Therapy

## 2022-11-08 DIAGNOSIS — M25512 Pain in left shoulder: Secondary | ICD-10-CM | POA: Diagnosis not present

## 2022-11-08 DIAGNOSIS — M25511 Pain in right shoulder: Secondary | ICD-10-CM | POA: Diagnosis not present

## 2022-11-08 DIAGNOSIS — M5459 Other low back pain: Secondary | ICD-10-CM | POA: Insufficient documentation

## 2022-11-08 DIAGNOSIS — M6281 Muscle weakness (generalized): Secondary | ICD-10-CM | POA: Diagnosis not present

## 2022-11-08 DIAGNOSIS — R278 Other lack of coordination: Secondary | ICD-10-CM | POA: Insufficient documentation

## 2022-11-08 NOTE — Therapy (Signed)
OUTPATIENT PHYSICAL THERAPY LOWER EXTREMITY  TREATMENT   Patient Name: Misty Morris MRN: [id number] DOB:1951/05/24, 72 y.o., female Today's Date: 11/08/2022   PT End of Session - 11/08/22 1114     Visit Number 2    Number of Visits 12    Date for PT Re-Evaluation 12/21/22   due to scheduling availability for the pool at the clinic   Authorization Type HTA    PT Start Time 1114    PT Stop Time [id number]    PT Time Calculation (min) 44 min    Activity Tolerance Patient tolerated treatment well    Behavior During Therapy [facility] for tasks assessed/performed               Past Medical History:  Diagnosis Date   Fibromyalgia    GERD (gastroesophageal reflux disease)    Hypercholesterolemia    Hypertension    Polymyalgia rheumatica    Rheumatoid arthritis    "atypical/MD 08/2017" (02/14/2018)   Shingles    Stroke 08/2017   "no residual" (02/14/2018)   TIA (transient ischemic attack) 02/14/2018   Type 2 diabetes, diet controlled    Vision impairment    right eye   Past Surgical History:  Procedure Laterality Date   CATARACT EXTRACTION W/ INTRAOCULAR LENS  IMPLANT, BILATERAL     COLONOSCOPY WITH PROPOFOL N/A 10/12/2019   Procedure: COLONOSCOPY WITH PROPOFOL;  Surgeon: Carol Ada, MD;  Location: [facility];  Service: Endoscopy;  Laterality: N/A;   HEMOSTASIS CLIP PLACEMENT  10/12/2019   Procedure: HEMOSTASIS CLIP PLACEMENT;  Surgeon: Carol Ada, MD;  Location: [facility];  Service: Endoscopy;;   JOINT REPLACEMENT     OPEN REDUCTION LE FORT I FRACTURE  1980s   PARTIAL KNEE ARTHROPLASTY Left 01/17/2017   Procedure: UNICOMPARTMENTAL LEFT KNEE;  Surgeon: Paralee Cancel, MD;  Location: [facility];  Service: Orthopedics;  Laterality: Left;   POLYPECTOMY  10/12/2019   Procedure: POLYPECTOMY;  Surgeon: Carol Ada, MD;  [facility];  Service: Endoscopy;;   RADIOLOGY WITH ANESTHESIA N/A 08/11/2017   Procedure: MRI OF BRAIN WITH AND WITHOUT CONSTRAST, AND OF THE ORBIT WITH AND  WITHOUT CONTRAST;  Surgeon: Radiologist, Medication, MD;  Location: [place];  Service: Radiology;  Laterality: N/A;   RADIOLOGY WITH ANESTHESIA N/A 09/06/2017   Procedure: MRI OF BRAIN;  Surgeon: Radiologist, Medication, MD;  Location: [place];  Service: Radiology;  Laterality: N/A;   RHINOPLASTY     TOTAL KNEE ARTHROPLASTY Right 09/30/2015   Procedure: TOTAL RIGHT KNEE ARTHROPLASTY;  Surgeon: Paralee Cancel, MD;  Location[facility];  Service: Orthopedics;  Laterality: Right;   VAGINAL HYSTERECTOMY     Patient Active Problem List   Diagnosis Date Noted   UTI (urinary tract infection) 10/07/2018   Sepsis 10/07/2018   Hypokalemia 10/07/2018   S/P total thyroidectomy 10/07/2018   Rheumatoid arthritis 09/18/2018   Papillary thyroid carcinoma 08/24/2018   TIA (transient ischemic attack) 02/14/2018   Hyperlipidemia    Ischemic optic neuropathy of right eye    Stroke 09/04/2017   HTN (hypertension) 09/04/2017   DM (diabetes mellitus) 09/04/2017   Polymyalgia rheumatica 09/04/2017   Fibromyalgia 09/04/2017   S/P left UKR 01/17/2017   Overweight (BMI 25.0-29.9) 10/01/2015   S/P right TKA 09/30/2015    PCP: Janie Morning, DO  REFERRING PROVIDER: Lahoma Rocker, MD  REFERRING DIAG: M79.7 (ICD-10-CM) - Fibromyalgia  THERAPY DIAG:  Bilateral shoulder pain, unspecified chronicity  Other low back pain  Muscle weakness (generalized)  Rationale for Evaluation and  Treatment: Rehabilitation  ONSET DATE: PT order 09/02/2022  SUBJECTIVE:   SUBJECTIVE STATEMENT: Pt reports she doesn't have an MRI scheduled yet for shoulder, but it continues to hurt whenever she moves her RUE.  She has used the pool at [facility] in past, but hasn't lately.    PERTINENT HISTORY: RA, OA in cervical, hands, and lumbar, Polymyalgia rheumatica, CVA / TIA in 2019, DM type 2, HTN Pt states her balance is a little off and her peripheral vision is off R TKA 2017, partial L knee replacement 2018,  PAIN:  Are you having  pain? Yes Location: Generalized  Pain rating:   7/10    PRECAUTIONS: Other: RA, OA, CVA/TIA, balance, TKA  WEIGHT BEARING RESTRICTIONS: No  FALLS:  Has patient fallen in last 6 months? No  LIVING ENVIRONMENT: Lives with: lives alone Lives in: 1 story home Stairs:   2 steps to enter home and a post to hold on to Has following equipment at home: Single point cane and walker  OCCUPATION: Pt is retired.   PLOF: Independent; Pt's sx's have been worsening over the years which affects her mobility and performance of daily activities.   PATIENT GOALS: improved pain, tolerance to activity, and performance of household chores   OBJECTIVE:   DIAGNOSTIC FINDINGS: N/A  PATIENT SURVEYS:  FOTO 44 with a goal of 52 at visit #11  COGNITION: Overall cognitive status: Within functional limits for tasks assessed      UE ROM: Shoulder flexion and Abduction:  WFL Elbow flexion: WFL   UPPER EXTREMITY STRENGTH:  Strength Right eval Left eval  Shouder flexion 5/5 5/5  Shoulder Abduction 5/5 5/5  Hip abduction    Hip adduction    Shoulder internal rotation [facility] WFL  Shoulder external rotation Weak and painful Stronger on Left though still has weakness  Elbow flexion 5/5 5/5  Elbow extension 4+/5 4+/5  Ankle dorsiflexion    Ankle plantarflexion    Ankle inversion    Ankle eversion     (Blank rows = not tested)  LOWER EXTREMITY MMT:  MMT Right eval Left eval  Hip flexion 5/5 5/5  Hip extension    Hip abduction 4/5 5/5  Hip adduction    Hip internal rotation    Hip external rotation 4+/5 4+/5  Knee flexion 5/5 seated 5/5 seated  Knee extension 5/5 5/5  Ankle dorsiflexion 5/5 5/5  Ankle plantarflexion WFL seated WFL seated  Ankle inversion    Ankle eversion     (Blank rows = not tested)    FUNCTIONAL TESTS:  5x STS test:  17 seconds without Ue's  BALANCE: SLS:  Pt unable to perform. Tandem stance 3-4 sec with SBA, not steady.  GAIT: Comments: Pt ambulates  without an AD.  Pt had occasions of unsteadiness that she self corrected.  She has limited arm swing on R, normal on L.    TODAY'S TREATMENT:  Pt seen for aquatic therapy today.  Treatment took place in water 3.5-4.75 ft in depth at the [facility] pool. Temp of water was 91.  Pt entered/exited the pool via stairs independently with bilat rail. * without UE support:  walking forward/backward,  side stepping * with rainbow hand floats:  tricep press downs x 10;  bilat shoulder horiz abdct/addct in staggered stance;  wide stance with addct/abdct x 20; alternating row while marching forward/backwards  * holding wall:  heel raises x 10; hip abdct/addct x 10; hip ext to toe touch x 10; alternating hamstring curls x 10;   * seated on bench in water:  long sitting hip abdct/ addct x 10; alternating LAQ with DF x 10 each; STS (feet on blue step)  x 10 * squats pushing yellow hand x 10  Pt requires the buoyancy and hydrostatic pressure of water for support, and to offload joints by unweighting joint load by at least 50 % in navel deep water and by at least 75-80% in chest to neck deep water.  Viscosity of the water is needed for resistance of strengthening. Water current perturbations provides challenge to standing balance requiring increased core activation.    PATIENT EDUCATION:  Education details: aquatic progressions and modifications   Person educated: Patient Education method: Explanation Education comprehension: verbalized understanding  HOME EXERCISE PROGRAM: TBA  ASSESSMENT:  CLINICAL IMPRESSION: Pt tolerated all exercises in water well, reporting overall reduction in generalized pain.  She is confident in aquatic setting and able to take direction from therapist on deck.  Will plan to create well-rounded pool program to compliment her land [facility]; suggested 2 days/wk in pool and 2 days on land.   Pt should benefit from skilled PT services to address impairments, improve tolerance to activity, and improve overall function. Goals are ongoing.     OBJECTIVE IMPAIRMENTS: decreased activity tolerance, decreased balance, decreased endurance, decreased mobility, difficulty walking, decreased strength, impaired UE functional use, and pain.   ACTIVITY LIMITATIONS: carrying, lifting, standing, and locomotion level  PARTICIPATION LIMITATIONS: cleaning, laundry, shopping, and community activity  PERSONAL FACTORS: Time since onset of injury/illness/exacerbation and 3+ comorbidities: RA, OA, Polymyalgia rheumatica, CVA / TIA, DM, balance, R TKA,  and partial L knee replacement   are also affecting patient's functional outcome.   REHAB POTENTIAL: Fair time of onset and multiple co-morbidities  CLINICAL DECISION MAKING: Evolving/moderate complexity  EVALUATION COMPLEXITY: Moderate   GOALS:   SHORT TERM GOALS: Pt will tolerate aquatic therapy without adverse effects for improved tolerance to activity and pain.  Baseline: Goal status: INITIAL Target date: 11/23/2022    2.  Pt will tolerate progression of aquatic exercises without significant pain in order for improved strength, function, and tolerance to activity.  Baseline:  Goal status: INITIAL Target date: 12/07/2022   3.  Pt will perform 5x STS test in no > 12 sec for improved functional LE strength and performance of transfers.  Baseline:  Goal status: INITIAL Target date:  11/30/2022   4.  Pt will report at least a 25% improvement in pain and sx's overall for improved performance of daily activities and daily mobility.  Baseline:  Goal status: INITIAL   LONG TERM GOALS: Target date: 12/21/2022    Pt will perform SLS at least 5 sec bilat for improved proprioception and functional stability with daily mobility.    Baseline:  Goal status:  INITIAL  2.  Pt will demonstrate and report improved  steadiness with gait.   Baseline:  Goal status: INITIAL  3.  Pt will report at least a 60-70% improvement in pain and sx's overall. Baseline:  Goal status: INITIAL  4.  Pt will be independent with aquatic HEP for improved strength, pain, and function.   Baseline:  Goal status: INITIAL  5.  Pt will report at least a 50% improvement in tolerance with performing household chores.  Baseline:  Goal status: INITIAL     PLAN:  PT FREQUENCY: 2x/week  PT DURATION: other: 10 weeks overall due to schedule availability for the pool though probably 6 weeks of treatment  PLANNED INTERVENTIONS: Therapeutic exercises, Therapeutic activity, Neuromuscular re-education, Balance training, Gait training, Patient/Family education, Self Care, Stair training, Aquatic Therapy, Dry Needling, Electrical stimulation, Cryotherapy, Moist heat, Taping, Ultrasound, Manual therapy, and Re-evaluation  PLAN FOR NEXT SESSION: 3-4 weeks of aquatic therapy and determine if pt needs further aquatic or could benefit from land  Parker, PTA 11/08/22 1:05 PM [facility] [address] [place], [place], [address] Phone: [phone]   Fax:  [phone]

## 2022-11-11 ENCOUNTER — Ambulatory Visit (HOSPITAL_BASED_OUTPATIENT_CLINIC_OR_DEPARTMENT_OTHER): Payer: PPO | Admitting: Physical Therapy

## 2022-11-11 ENCOUNTER — Encounter (HOSPITAL_BASED_OUTPATIENT_CLINIC_OR_DEPARTMENT_OTHER): Payer: Self-pay | Admitting: Physical Therapy

## 2022-11-11 DIAGNOSIS — M25511 Pain in right shoulder: Secondary | ICD-10-CM

## 2022-11-11 DIAGNOSIS — R278 Other lack of coordination: Secondary | ICD-10-CM

## 2022-11-11 DIAGNOSIS — M6281 Muscle weakness (generalized): Secondary | ICD-10-CM

## 2022-11-11 DIAGNOSIS — M5459 Other low back pain: Secondary | ICD-10-CM

## 2022-11-11 NOTE — Therapy (Signed)
OUTPATIENT PHYSICAL THERAPY LOWER EXTREMITY  TREATMENT   Patient Name: Misty Morris MRN: [id number] DOB:1950/08/28, 72 y.o., female Today's Date: 11/11/2022   PT End of Session - 11/11/22 1036     Visit Number 3    Number of Visits 12    Date for PT Re-Evaluation 12/21/22    Authorization Type HTA    PT Start Time 1030    PT Stop Time 1110    PT Time Calculation (min) 40 min    Activity Tolerance Patient tolerated treatment well    Behavior During Therapy WFL for tasks assessed/performed               Past Medical History:  Diagnosis Date   Fibromyalgia    GERD (gastroesophageal reflux disease)    Hypercholesterolemia    Hypertension    Polymyalgia rheumatica    Rheumatoid arthritis    "atypical/MD 08/2017" (02/14/2018)   Shingles    Stroke 08/2017   "no residual" (02/14/2018)   TIA (transient ischemic attack) 02/14/2018   Type 2 diabetes, diet controlled    Vision impairment    right eye   Past Surgical History:  Procedure Laterality Date   CATARACT EXTRACTION W/ INTRAOCULAR LENS  IMPLANT, BILATERAL     COLONOSCOPY WITH PROPOFOL N/A 10/12/2019   Procedure: COLONOSCOPY WITH PROPOFOL;  Surgeon: Carol Ada, MD;  [facility];  Service: Endoscopy;  Laterality: N/A;   HEMOSTASIS CLIP PLACEMENT  10/12/2019   Procedure: HEMOSTASIS CLIP PLACEMENT;  Surgeon: Carol Ada, MD;  [facility];  Service: Endoscopy;;   JOINT REPLACEMENT     OPEN REDUCTION LE FORT I FRACTURE  1980s   PARTIAL KNEE ARTHROPLASTY Left 01/17/2017   Procedure: UNICOMPARTMENTAL LEFT KNEE;  Surgeon: Paralee Cancel, MD;  [facility];  Service: Orthopedics;  Laterality: Left;   POLYPECTOMY  10/12/2019   Procedure: POLYPECTOMY;  Surgeon: Carol Ada, MD;  [facility];  Service: Endoscopy;;   RADIOLOGY WITH ANESTHESIA N/A 08/11/2017   Procedure: MRI OF BRAIN WITH AND WITHOUT CONSTRAST, AND OF THE ORBIT WITH AND WITHOUT CONTRAST;  Surgeon: Radiologist, Medication, MD;   Location: [place];  Service: Radiology;  Laterality: N/A;   RADIOLOGY WITH ANESTHESIA N/A 09/06/2017   Procedure: MRI OF BRAIN;  Surgeon: Radiologist, Medication, MD;  Location: [place];  Service: Radiology;  Laterality: N/A;   RHINOPLASTY     TOTAL KNEE ARTHROPLASTY Right 09/30/2015   Procedure: TOTAL RIGHT KNEE ARTHROPLASTY;  Surgeon: Paralee Cancel, MD;  [facility];  Service: Orthopedics;  Laterality: Right;   VAGINAL HYSTERECTOMY     Patient Active Problem List   Diagnosis Date Noted   UTI (urinary tract infection) 10/07/2018   Sepsis 10/07/2018   Hypokalemia 10/07/2018   S/P total thyroidectomy 10/07/2018   Rheumatoid arthritis 09/18/2018   Papillary thyroid carcinoma 08/24/2018   TIA (transient ischemic attack) 02/14/2018   Hyperlipidemia    Ischemic optic neuropathy of right eye    Stroke 09/04/2017   HTN (hypertension) 09/04/2017   DM (diabetes mellitus) 09/04/2017   Polymyalgia rheumatica 09/04/2017   Fibromyalgia 09/04/2017   S/P left UKR 01/17/2017   Overweight (BMI 25.0-29.9) 10/01/2015   S/P right TKA 09/30/2015    PCP: Janie Morning, DO  REFERRING PROVIDER: Lahoma Rocker, MD  REFERRING DIAG: M79.7 (ICD-10-CM) - Fibromyalgia  THERAPY DIAG:  Bilateral shoulder pain, unspecified chronicity  Other low back pain  Muscle weakness (generalized)  Other lack of coordination  Rationale for Evaluation and Treatment: Rehabilitation  ONSET DATE: PT  order 09/02/2022  SUBJECTIVE:   SUBJECTIVE STATEMENT: Pt reports she "could feel she had done something" the day following the last session.  She states she held off on UE exercises at gym on Tue and arm feels not as sore.     PERTINENT HISTORY: RA, OA in cervical, hands, and lumbar, Polymyalgia rheumatica, CVA / TIA in 2019, DM type 2, HTN Pt states her balance is a little off and her peripheral vision is off R TKA 2017, partial L knee replacement 2018,  PAIN:  Are you having pain? Yes Location: Generalized  Pain  rating:   6/10  "a good day"    PRECAUTIONS: Other: RA, OA, CVA/TIA, balance, TKA  WEIGHT BEARING RESTRICTIONS: No  FALLS:  Has patient fallen in last 6 months? No  LIVING ENVIRONMENT: Lives with: lives alone Lives in: 1 story home Stairs:   2 steps to enter home and a post to hold on to Has following equipment at home: Single point cane and walker  OCCUPATION: Pt is retired.   PLOF: Independent; Pt's sx's have been worsening over the years which affects her mobility and performance of daily activities.   PATIENT GOALS: improved pain, tolerance to activity, and performance of household chores   OBJECTIVE:   DIAGNOSTIC FINDINGS: N/A  PATIENT SURVEYS:  FOTO 44 with a goal of 52 at visit #11  COGNITION: Overall cognitive status: Within functional limits for tasks assessed      UE ROM: Shoulder flexion and Abduction:  WFL Elbow flexion: WFL   UPPER EXTREMITY STRENGTH:  Strength Right eval Left eval  Shouder flexion 5/5 5/5  Shoulder Abduction 5/5 5/5  Hip abduction    Hip adduction    Shoulder internal rotation [facility] WFL  Shoulder external rotation Weak and painful Stronger on Left though still has weakness  Elbow flexion 5/5 5/5  Elbow extension 4+/5 4+/5  Ankle dorsiflexion    Ankle plantarflexion    Ankle inversion    Ankle eversion     (Blank rows = not tested)  LOWER EXTREMITY MMT:  MMT Right eval Left eval  Hip flexion 5/5 5/5  Hip extension    Hip abduction 4/5 5/5  Hip adduction    Hip internal rotation    Hip external rotation 4+/5 4+/5  Knee flexion 5/5 seated 5/5 seated  Knee extension 5/5 5/5  Ankle dorsiflexion 5/5 5/5  Ankle plantarflexion WFL seated WFL seated  Ankle inversion    Ankle eversion     (Blank rows = not tested)    FUNCTIONAL TESTS:  5x STS test:  17 seconds without Ue's  BALANCE: SLS:  Pt unable to perform. Tandem stance 3-4 sec with SBA, not steady.  GAIT: Comments: Pt ambulates without an AD.  Pt had  occasions of unsteadiness that she self corrected.  She has limited arm swing on R, normal on L.    TODAY'S TREATMENT:  Pt seen for aquatic therapy today.  Treatment took place in water 3.5-4. ft in depth at the [facility] pool. Temp of water was 91.  Pt entered/exited the pool via stairs independently with bilat rail.  * without UE support:  walking forward/backward,  side stepping * with rainbow hand floats:  tricep press downs x 10;  bilat shoulder horiz abdct/addct in staggered stance (10 each LE forward);  wide stance with addct/abdct x 10; alternating row while marching forward/backwards  * holding yellow noodle:  heel raises x 10; hip abdct/addct x 10; alternating hamstring curls x 10; tandem gait forward/backward (2 laps) * squats pushing yellow hand float under water x 10, 2 sets - walking 2 laps in between sets * holding wall: Hip flex to- hip ext to toe touch x 10 * seated on 4th step in water, holding rails: cycling x 20 revolutions;  long sitting hip abdct/ addct x 10; alternating LAQ with DF x 10 each; STS  x 8; repeat cycling, hip abdct/addct, LAQ * bilat shoulder IR/ER   Pt requires the buoyancy and hydrostatic pressure of water for support, and to offload joints by unweighting joint load by at least 50 % in navel deep water and by at least 75-80% in chest to neck deep water.  Viscosity of the water is needed for resistance of strengthening. Water current perturbations provides challenge to standing balance requiring increased core activation.    PATIENT EDUCATION:  Education details: aquatic progressions and modifications   Person educated: Patient Education method: Explanation Education comprehension: verbalized understanding  HOME EXERCISE PROGRAM: TBA  ASSESSMENT:  CLINICAL IMPRESSION: Good tolerance towards aquatics last session;  repeated many of same exercises and added additional exercises for balance today. Pt tolerated all exercises in water well, reporting no increase in pain level.  Encouraged pt to give feedback during session regarding if exercises as too strenuous or too easy, as we are not looking to flare up her symptoms outside of sessions.     Will plan to create well-rounded pool program to compliment her land [facility] center; suggested 2 days/wk in pool and 2 days in gym.   Pt should benefit from skilled PT services to address impairments, improve tolerance to activity, and improve overall function. Goals are ongoing.     OBJECTIVE IMPAIRMENTS: decreased activity tolerance, decreased balance, decreased endurance, decreased mobility, difficulty walking, decreased strength, impaired UE functional use, and pain.   ACTIVITY LIMITATIONS: carrying, lifting, standing, and locomotion level  PARTICIPATION LIMITATIONS: cleaning, laundry, shopping, and community activity  PERSONAL FACTORS: Time since onset of injury/illness/exacerbation and 3+ comorbidities: RA, OA, Polymyalgia rheumatica, CVA / TIA, DM, balance, R TKA,  and partial L knee replacement   are also affecting patient's functional outcome.   REHAB POTENTIAL: Fair time of onset and multiple co-morbidities  CLINICAL DECISION MAKING: Evolving/moderate complexity  EVALUATION COMPLEXITY: Moderate   GOALS:   SHORT TERM GOALS: Pt will tolerate aquatic therapy without adverse effects for improved tolerance to activity and pain.  Baseline: Goal status: INITIAL Target date: 11/23/2022    2.  Pt will tolerate progression of aquatic exercises without significant pain in order for improved strength, function, and tolerance to activity.  Baseline:  Goal status: INITIAL Target date: 12/07/2022   3.  Pt will perform 5x STS test in no > 12 sec for improved functional LE strength and performance of transfers.  Baseline:   Goal status: INITIAL Target date:  11/30/2022   4.  Pt will report at least a 25% improvement in pain and sx's overall for improved performance of daily activities and daily mobility.  Baseline:  Goal status: INITIAL   LONG TERM GOALS: Target date: 12/21/2022    Pt will perform SLS at least 5 sec bilat for improved proprioception and functional stability with daily mobility.    Baseline:  Goal status: INITIAL  2.  Pt will demonstrate and report improved steadiness with gait.   Baseline:  Goal status: INITIAL  3.  Pt will report at least a 60-70% improvement in pain and sx's overall. Baseline:  Goal status: INITIAL  4.  Pt will be independent with aquatic HEP for improved strength, pain, and function.   Baseline:  Goal status: INITIAL  5.  Pt will report at least a 50% improvement in tolerance with performing household chores.  Baseline:  Goal status: INITIAL     PLAN:  PT FREQUENCY: 2x/week  PT DURATION: other: 10 weeks overall due to schedule availability for the pool though probably 6 weeks of treatment  PLANNED INTERVENTIONS: Therapeutic exercises, Therapeutic activity, Neuromuscular re-education, Balance training, Gait training, Patient/Family education, Self Care, Stair training, Aquatic Therapy, Dry Needling, Electrical stimulation, Cryotherapy, Moist heat, Taping, Ultrasound, Manual therapy, and Re-evaluation  PLAN FOR NEXT SESSION: 3-4 weeks of aquatic therapy and determine if pt needs further aquatic or could benefit from land Jeannette, PTA 11/11/22 1:25 PM [place] [facility] [address] [place], [place], [address] Phone: [phone]   Fax:  [phone]

## 2022-11-14 NOTE — Therapy (Signed)
OUTPATIENT PHYSICAL THERAPY LOWER EXTREMITY  TREATMENT   Patient Name: Misty Morris MRN: [id number] DOB:1951/05/21, 72 y.o., female Today's Date: 11/15/2022   PT End of Session - 11/15/22 1021     Visit Number 4    Number of Visits 12    Date for PT Re-Evaluation 12/21/22    Authorization Type HTA    PT Start Time 1031    PT Stop Time 1115    PT Time Calculation (min) 44 min    Activity Tolerance Patient tolerated treatment well    Behavior During Therapy [facility] for tasks assessed/performed               Past Medical History:  Diagnosis Date   Fibromyalgia    GERD (gastroesophageal reflux disease)    Hypercholesterolemia    Hypertension    Polymyalgia rheumatica    Rheumatoid arthritis    "atypical/MD 08/2017" (02/14/2018)   Shingles    Stroke 08/2017   "no residual" (02/14/2018)   TIA (transient ischemic attack) 02/14/2018   Type 2 diabetes, diet controlled    Vision impairment    right eye   Past Surgical History:  Procedure Laterality Date   CATARACT EXTRACTION W/ INTRAOCULAR LENS  IMPLANT, BILATERAL     COLONOSCOPY WITH PROPOFOL N/A 10/12/2019   Procedure: COLONOSCOPY WITH PROPOFOL;  Surgeon: Jeani Hawking, MD;  Location: [facility];  Service: Endoscopy;  Laterality: N/A;   HEMOSTASIS CLIP PLACEMENT  10/12/2019   Procedure: HEMOSTASIS CLIP PLACEMENT;  Surgeon: Jeani Hawking, MD;  Location: [facility];  Service: Endoscopy;;   JOINT REPLACEMENT     OPEN REDUCTION LE FORT I FRACTURE  1980s   PARTIAL KNEE ARTHROPLASTY Left 01/17/2017   Procedure: UNICOMPARTMENTAL LEFT KNEE;  Surgeon: Durene Romans, MD;  Location: [facility];  Service: Orthopedics;  Laterality: Left;   POLYPECTOMY  10/12/2019   Procedure: POLYPECTOMY;  Surgeon: Jeani Hawking, MD;  [facility];  Service: Endoscopy;;   RADIOLOGY WITH ANESTHESIA N/A 08/11/2017   Procedure: MRI OF BRAIN WITH AND WITHOUT CONSTRAST, AND OF THE ORBIT WITH AND WITHOUT CONTRAST;  Surgeon: Radiologist, Medication, MD;   Location: [facility];  Service: Radiology;  Laterality: N/A;   RADIOLOGY WITH ANESTHESIA N/A 09/06/2017   Procedure: MRI OF BRAIN;  Surgeon: Radiologist, Medication, MD;  Location: [facility];  Service: Radiology;  Laterality: N/A;   RHINOPLASTY     TOTAL KNEE ARTHROPLASTY Right 09/30/2015   Procedure: TOTAL RIGHT KNEE ARTHROPLASTY;  Surgeon: Durene Romans, MD;  [facility];  Service: Orthopedics;  Laterality: Right;   VAGINAL HYSTERECTOMY     Patient Active Problem List   Diagnosis Date Noted   UTI (urinary tract infection) 10/07/2018   Sepsis 10/07/2018   Hypokalemia 10/07/2018   S/P total thyroidectomy 10/07/2018   Rheumatoid arthritis 09/18/2018   Papillary thyroid carcinoma 08/24/2018   TIA (transient ischemic attack) 02/14/2018   Hyperlipidemia    Ischemic optic neuropathy of right eye    Stroke 09/04/2017   HTN (hypertension) 09/04/2017   DM (diabetes mellitus) 09/04/2017   Polymyalgia rheumatica 09/04/2017   Fibromyalgia 09/04/2017   S/P left UKR 01/17/2017   Overweight (BMI 25.0-29.9) 10/01/2015   S/P right TKA 09/30/2015    PCP: Irena Reichmann, DO  REFERRING PROVIDER: Casimer Lanius, MD  REFERRING DIAG: M79.7 (ICD-10-CM) - Fibromyalgia  THERAPY DIAG:  Bilateral shoulder pain, unspecified chronicity  Other low back pain  Muscle weakness (generalized)  Other lack of coordination  Rationale for Evaluation and Treatment: Rehabilitation  ONSET DATE: PT  order 09/02/2022  SUBJECTIVE:   SUBJECTIVE STATEMENT: Pt reports she had a rough night last night which is unusual for me.     PERTINENT HISTORY: RA, OA in cervical, hands, and lumbar, Polymyalgia rheumatica, CVA / TIA in 2019, DM type 2, HTN Pt states her balance is a little off and her peripheral vision is off R TKA 2017, partial L knee replacement 2018,  PAIN:  Are you having pain? Yes Location: Generalized  Pain rating:   6-7/10  "a good day"    PRECAUTIONS: Other: RA, OA, CVA/TIA, balance, TKA  WEIGHT  BEARING RESTRICTIONS: No  FALLS:  Has patient fallen in last 6 months? No  LIVING ENVIRONMENT: Lives with: lives alone Lives in: 1 story home Stairs:   2 steps to enter home and a post to hold on to Has following equipment at home: Single point cane and walker  OCCUPATION: Pt is retired.   PLOF: Independent; Pt's sx's have been worsening over the years which affects her mobility and performance of daily activities.   PATIENT GOALS: improved pain, tolerance to activity, and performance of household chores   OBJECTIVE:   DIAGNOSTIC FINDINGS: N/A  PATIENT SURVEYS:  FOTO 44 with a goal of 52 at visit #11  COGNITION: Overall cognitive status: Within functional limits for tasks assessed      UE ROM: Shoulder flexion and Abduction:  WFL Elbow flexion: WFL   UPPER EXTREMITY STRENGTH:  Strength Right eval Left eval  Shouder flexion 5/5 5/5  Shoulder Abduction 5/5 5/5  Hip abduction    Hip adduction    Shoulder internal rotation [facility] WFL  Shoulder external rotation Weak and painful Stronger on Left though still has weakness  Elbow flexion 5/5 5/5  Elbow extension 4+/5 4+/5  Ankle dorsiflexion    Ankle plantarflexion    Ankle inversion    Ankle eversion     (Blank rows = not tested)  LOWER EXTREMITY MMT:  MMT Right eval Left eval  Hip flexion 5/5 5/5  Hip extension    Hip abduction 4/5 5/5  Hip adduction    Hip internal rotation    Hip external rotation 4+/5 4+/5  Knee flexion 5/5 seated 5/5 seated  Knee extension 5/5 5/5  Ankle dorsiflexion 5/5 5/5  Ankle plantarflexion WFL seated WFL seated  Ankle inversion    Ankle eversion     (Blank rows = not tested)    FUNCTIONAL TESTS:  5x STS test:  17 seconds without Ue's  BALANCE: SLS:  Pt unable to perform. Tandem stance 3-4 sec with SBA, not steady.  GAIT: Comments: Pt ambulates without an AD.  Pt had occasions of unsteadiness that she self corrected.  She has limited arm swing on R, normal on L.     TODAY'S TREATMENT:  Pt seen for aquatic therapy today.  Treatment took place in water 3.5-4. ft in depth at the [facility] pool. Temp of water was 91.  Pt entered/exited the pool via stairs independently with bilat rail.  * without UE support:  walking forward/backward,  side stepping * with rainbow hand floats:  tricep press downs x 10;  bilat shoulder horiz abdct/addct in staggered stance;  wide stance with addct/abdct x 10; bilat shoulder IR/ER x 10 *6 widths forward and backward amb rainbow HB submerged to sides for core engagement.  - side stepping x 4 widths * holding rainbow HB:  heel raises x 10; toe raises x 10; hip abdct/addct x 10 ;  hip flex x10; hip ext x10; hip flex/extension x10 alternating. Cues for SLS balance * seated on 4th step in water, holding rails: cycling x 20 revolutions;  long sitting hip abdct/ addct x 10; flutter x 10. 2 sets of ea. * Sitting balance on noodle *straddling noodle: cycling; add/abd; XX skiing  Pt requires the buoyancy and hydrostatic pressure of water for support, and to offload joints by unweighting joint load by at least 50 % in navel deep water and by at least 75-80% in chest to neck deep water.  Viscosity of the water is needed for resistance of strengthening. Water current perturbations provides challenge to standing balance requiring increased core activation.    PATIENT EDUCATION:  Education details: aquatic progressions and modifications   Person educated: Patient Education method: Explanation Education comprehension: verbalized understanding  HOME EXERCISE PROGRAM: Access Code: [email or web address] URL: [email or web address] Date: 11/15/2022 Prepared by: Geni Bers This aquatic home exercise program from [employer] utilizes pictures from land based exercises, but has been adapted prior to  lamination and issuance.    ASSESSMENT:  CLINICAL IMPRESSION: Pt reports no improvement in pain with addition of aquatics so far but no increase either. Added focus on core strength and balance today which she has good tolerance. Pain does decrease with submersion. Able to gain sitting balance on yellow noodle.  Requires multiple reps of SLS (with exercise) to form motor plan to execute without LOB. Cues throughout for abd bracing for core stabilization. Began creating HEP.  Goals ongoing    Will plan to create well-rounded pool program to compliment her land [facility]; suggested 2 days/wk in pool and 2 days in gym.   Pt should benefit from skilled PT services to address impairments, improve tolerance to activity, and improve overall function. Goals are ongoing.     OBJECTIVE IMPAIRMENTS: decreased activity tolerance, decreased balance, decreased endurance, decreased mobility, difficulty walking, decreased strength, impaired UE functional use, and pain.   ACTIVITY LIMITATIONS: carrying, lifting, standing, and locomotion level  PARTICIPATION LIMITATIONS: cleaning, laundry, shopping, and community activity  PERSONAL FACTORS: Time since onset of injury/illness/exacerbation and 3+ comorbidities: RA, OA, Polymyalgia rheumatica, CVA / TIA, DM, balance, R TKA,  and partial L knee replacement   are also affecting patient's functional outcome.   REHAB POTENTIAL: Fair time of onset and multiple co-morbidities  CLINICAL DECISION MAKING: Evolving/moderate complexity  EVALUATION COMPLEXITY: Moderate   GOALS:   SHORT TERM GOALS: Pt will tolerate aquatic therapy without adverse effects for improved tolerance to activity and pain.  Baseline: Goal status: Met 11/15/22 Target date: 11/23/2022    2.  Pt will tolerate progression of aquatic exercises without significant pain in order for improved strength, function, and tolerance to activity.  Baseline:  Goal  status: INITIAL Target date: 12/07/2022  3.  Pt will perform 5x STS test in no > 12 sec for improved functional LE strength and performance of transfers.  Baseline:  Goal status: INITIAL Target date:  11/30/2022   4.  Pt will report at least a 25% improvement in pain and sx's overall for improved performance of daily activities and daily mobility.  Baseline:  Goal status: INITIAL   LONG TERM GOALS: Target date: 12/21/2022    Pt will perform SLS at least 5 sec bilat for improved proprioception and functional stability with daily mobility.    Baseline:  Goal status: INITIAL  2.  Pt will demonstrate and report improved steadiness with gait.   Baseline:  Goal status: INITIAL  3.  Pt will report at least a 60-70% improvement in pain and sx's overall. Baseline:  Goal status: INITIAL  4.  Pt will be independent with aquatic HEP for improved strength, pain, and function.   Baseline:  Goal status: INITIAL  5.  Pt will report at least a 50% improvement in tolerance with performing household chores.  Baseline:  Goal status: INITIAL     PLAN:  PT FREQUENCY: 2x/week  PT DURATION: other: 10 weeks overall due to schedule availability for the pool though probably 6 weeks of treatment  PLANNED INTERVENTIONS: Therapeutic exercises, Therapeutic activity, Neuromuscular re-education, Balance training, Gait training, Patient/Family education, Self Care, Stair training, Aquatic Therapy, Dry Needling, Electrical stimulation, Cryotherapy, Moist heat, Taping, Ultrasound, Manual therapy, and Re-evaluation  PLAN FOR NEXT SESSION: 3-4 weeks of aquatic therapy and determine if pt needs further aquatic or could benefit from land  [place] Tomma Lightning) Feliciano Wynter MPT 11/15/22 10:23 AM [facility] [address] [place], [place], [address] Phone: [phone]   Fax:  [phone]

## 2022-11-15 ENCOUNTER — Ambulatory Visit (HOSPITAL_BASED_OUTPATIENT_CLINIC_OR_DEPARTMENT_OTHER): Payer: PPO | Admitting: Physical Therapy

## 2022-11-15 ENCOUNTER — Encounter (HOSPITAL_BASED_OUTPATIENT_CLINIC_OR_DEPARTMENT_OTHER): Payer: Self-pay | Admitting: Physical Therapy

## 2022-11-15 DIAGNOSIS — M25512 Pain in left shoulder: Secondary | ICD-10-CM

## 2022-11-15 DIAGNOSIS — M6281 Muscle weakness (generalized): Secondary | ICD-10-CM

## 2022-11-15 DIAGNOSIS — R278 Other lack of coordination: Secondary | ICD-10-CM

## 2022-11-15 DIAGNOSIS — M25511 Pain in right shoulder: Secondary | ICD-10-CM | POA: Diagnosis not present

## 2022-11-15 DIAGNOSIS — M5459 Other low back pain: Secondary | ICD-10-CM

## 2022-11-18 ENCOUNTER — Encounter (HOSPITAL_BASED_OUTPATIENT_CLINIC_OR_DEPARTMENT_OTHER): Payer: Self-pay | Admitting: Physical Therapy

## 2022-11-18 ENCOUNTER — Ambulatory Visit (HOSPITAL_BASED_OUTPATIENT_CLINIC_OR_DEPARTMENT_OTHER): Payer: PPO | Admitting: Physical Therapy

## 2022-11-18 DIAGNOSIS — M6281 Muscle weakness (generalized): Secondary | ICD-10-CM

## 2022-11-18 DIAGNOSIS — M25511 Pain in right shoulder: Secondary | ICD-10-CM | POA: Diagnosis not present

## 2022-11-18 DIAGNOSIS — M5459 Other low back pain: Secondary | ICD-10-CM

## 2022-11-18 NOTE — Therapy (Signed)
OUTPATIENT PHYSICAL THERAPY LOWER EXTREMITY  TREATMENT   Patient Name: Misty Morris MRN: [id number] DOB:[birth year], 72 y.o., female Today's Date: 11/18/2022   PT End of Session - 11/18/22 1136     Visit Number 5    Number of Visits 12    Date for PT Re-Evaluation 12/21/22    Authorization Type HTA    PT Start Time 1025    PT Stop Time 1105    PT Time Calculation (min) 40 min    Activity Tolerance Patient tolerated treatment well    Behavior During Therapy WFL for tasks assessed/performed                Past Medical History:  Diagnosis Date   Fibromyalgia    GERD (gastroesophageal reflux disease)    Hypercholesterolemia    Hypertension    Polymyalgia rheumatica    Rheumatoid arthritis    "atypical/MD 08/2017" (02/14/2018)   Shingles    Stroke 08/2017   "no residual" (02/14/2018)   TIA (transient ischemic attack) 02/14/2018   Type 2 diabetes, diet controlled    Vision impairment    right eye   Past Surgical History:  Procedure Laterality Date   CATARACT EXTRACTION W/ INTRAOCULAR LENS  IMPLANT, BILATERAL     COLONOSCOPY WITH PROPOFOL N/A 10/12/2019   Procedure: COLONOSCOPY WITH PROPOFOL;  Surgeon: Jeani Hawking, MD;  [facility];  Service: Endoscopy;  Laterality: N/A;   HEMOSTASIS CLIP PLACEMENT  10/12/2019   Procedure: HEMOSTASIS CLIP PLACEMENT;  Surgeon: Jeani Hawking, MD;  [facility];  Service: Endoscopy;;   JOINT REPLACEMENT     OPEN REDUCTION LE FORT I FRACTURE  1980s   PARTIAL KNEE ARTHROPLASTY Left 01/17/2017   Procedure: UNICOMPARTMENTAL LEFT KNEE;  Surgeon: Durene Romans, MD;  [facility];  Service: Orthopedics;  Laterality: Left;   POLYPECTOMY  10/12/2019   Procedure: POLYPECTOMY;  Surgeon: Jeani Hawking, MD;  [facility];  Service: Endoscopy;;   RADIOLOGY WITH ANESTHESIA N/A 08/11/2017   Procedure: MRI OF BRAIN WITH AND WITHOUT CONSTRAST, AND OF THE ORBIT WITH AND WITHOUT CONTRAST;  Surgeon: Radiologist, Medication, MD;   Location: [facility];  Service: Radiology;  Laterality: N/A;   RADIOLOGY WITH ANESTHESIA N/A 09/06/2017   Procedure: MRI OF BRAIN;  Surgeon: Radiologist, Medication, MD;  Location: [facility];  Service: Radiology;  Laterality: N/A;   RHINOPLASTY     TOTAL KNEE ARTHROPLASTY Right 09/30/2015   Procedure: TOTAL RIGHT KNEE ARTHROPLASTY;  Surgeon: Durene Romans, MD;  Location: [facility];  Service: Orthopedics;  Laterality: Right;   VAGINAL HYSTERECTOMY     Patient Active Problem List   Diagnosis Date Noted   UTI (urinary tract infection) 10/07/2018   Sepsis 10/07/2018   Hypokalemia 10/07/2018   S/P total thyroidectomy 10/07/2018   Rheumatoid arthritis 09/18/2018   Papillary thyroid carcinoma 08/24/2018   TIA (transient ischemic attack) 02/14/2018   Hyperlipidemia    Ischemic optic neuropathy of right eye    Stroke 09/04/2017   HTN (hypertension) 09/04/2017   DM (diabetes mellitus) 09/04/2017   Polymyalgia rheumatica 09/04/2017   Fibromyalgia 09/04/2017   S/P left UKR 01/17/2017   Overweight (BMI 25.0-29.9) 10/01/2015   S/P right TKA 09/30/2015    PCP: Irena Reichmann, DO  REFERRING PROVIDER: Casimer Lanius, MD  REFERRING DIAG: M79.7 (ICD-10-CM) - Fibromyalgia  THERAPY DIAG:  Bilateral shoulder pain, unspecified chronicity  Other low back pain  Muscle weakness (generalized)  Rationale for Evaluation and Treatment: Rehabilitation  ONSET DATE: PT order 09/02/2022  SUBJECTIVE:  SUBJECTIVE STATEMENT: "Today is a bad day due to the weather".   PERTINENT HISTORY: RA, OA in cervical, hands, and lumbar, Polymyalgia rheumatica, CVA / TIA in 2019, DM type 2, HTN Pt states her balance is a little off and her peripheral vision is off R TKA 2017, partial L knee replacement 2018,  PAIN:  Are you having pain? Yes Location: Generalized  Pain rating:   7-8/10   PRECAUTIONS: Other: RA, OA, CVA/TIA, balance, TKA  WEIGHT BEARING RESTRICTIONS: No  FALLS:  Has patient fallen in last 6 months?  No  LIVING ENVIRONMENT: Lives with: lives alone Lives in: 1 story home Stairs:   2 steps to enter home and a post to hold on to Has following equipment at home: Single point cane and walker  OCCUPATION: Pt is retired.   PLOF: Independent; Pt's sx's have been worsening over the years which affects her mobility and performance of daily activities.   PATIENT GOALS: improved pain, tolerance to activity, and performance of household chores   OBJECTIVE:   DIAGNOSTIC FINDINGS: N/A  PATIENT SURVEYS:  FOTO 44 with a goal of 52 at visit #11  COGNITION: Overall cognitive status: Within functional limits for tasks assessed      UE ROM: Shoulder flexion and Abduction:  WFL Elbow flexion: WFL   UPPER EXTREMITY STRENGTH:  Strength Right eval Left eval  Shouder flexion 5/5 5/5  Shoulder Abduction 5/5 5/5  Hip abduction    Hip adduction    Shoulder internal rotation [facility] WFL  Shoulder external rotation Weak and painful Stronger on Left though still has weakness  Elbow flexion 5/5 5/5  Elbow extension 4+/5 4+/5  Ankle dorsiflexion    Ankle plantarflexion    Ankle inversion    Ankle eversion     (Blank rows = not tested)  LOWER EXTREMITY MMT:  MMT Right eval Left eval  Hip flexion 5/5 5/5  Hip extension    Hip abduction 4/5 5/5  Hip adduction    Hip internal rotation    Hip external rotation 4+/5 4+/5  Knee flexion 5/5 seated 5/5 seated  Knee extension 5/5 5/5  Ankle dorsiflexion 5/5 5/5  Ankle plantarflexion WFL seated WFL seated  Ankle inversion    Ankle eversion     (Blank rows = not tested)    FUNCTIONAL TESTS:  5x STS test:  17 seconds without Ue's 11/18/22:  5x STS = 14.71s without UEs BALANCE: SLS:  Pt unable to perform. Tandem stance 3-4 sec with SBA, not steady.  11/18/22: SLS,  4 sec each LE  GAIT: Comments: Pt ambulates without an AD.  Pt had occasions of unsteadiness that she self corrected.  She has limited arm swing on R, normal on L.     TODAY'S TREATMENT:                                                                                                                              Pt  seen for aquatic therapy today.  Treatment took place in water 3.5-4. ft in depth at the [facility] pool. Temp of water was 91.  Pt entered/exited the pool via stairs independently with bilat rail.  * without UE support:  walking forward/backward,  * side stepping with rainbow hand floats and arm addct * with rainbow hand floats: staggered stance and tricep press downs x 10;  * holding yellow noodle: tandem gait forward/ backward  * holding wall:  heel/toe raises x 10;  single leg clam x 10 each;  * return to walking forward/ backward  * TrA set with solid noodle pull down x 10 * staggered stance with kick board row x 10 each LE forward (cues to increase speed for more challenge) *straddling noodle: cycling;  jumping jack LEs;  skiing * mini squat at wall x 10;  Leg swings in flex/ext * return to walking forward / backward for rest and recovery  Pt requires the buoyancy and hydrostatic pressure of water for support, and to offload joints by unweighting joint load by at least 50 % in navel deep water and by at least 75-80% in chest to neck deep water.  Viscosity of the water is needed for resistance of strengthening. Water current perturbations provides challenge to standing balance requiring increased core activation.    PATIENT EDUCATION:  Education details: aquatic progressions and modifications   Person educated: Patient Education method: Explanation Education comprehension: verbalized understanding  HOME EXERCISE PROGRAM: Access Code: [email or web address] URL: [email or web address] Date: 11/18/2022 Prepared by: [facility]  Exercises - Forward and Backward Walking with Hand Floats  - 1 x daily - 7 x weekly - Side Stepping with Hand Floats  - 1 x daily - 7 x weekly - Walking Tandem  Stance  - 1 x daily - 7 x weekly - 3 sets - 10 reps - Mini Squat with Counter Support  - 1 x daily - 7 x weekly - 1-2 sets - 10 reps - Standing Hip Abduction  - 1 x daily - 7 x weekly - 1-2 sets - 10 reps - Heel Toe Raises with Unilateral Counter Support  - 1 x daily - 7 x weekly - 1-2 sets - 10 reps - Shoulder Extension with Pool Noodle Pull Down  - 1 x daily - 7 x weekly - 1-2 sets - 10 reps - Leg Swing Single Leg Balance  - 1 x daily - 7 x weekly - 1-2 sets - 10 reps - Single Leg Clamshell - Hold Wall   - 1 x daily - 7 x weekly - 1-2 sets - 10 reps - Staggered Stance Row with Kick Board  - 1 x daily - 7 x weekly - 1-2 sets - 10 reps - Seated Straddle on Flotation Forward Breast Stroke Arms and Bicycle Legs  - 1 x daily - 7 x weekly  * This aquatic home exercise program from [employer] utilizes pictures from land based exercises, but has been adapted prior to lamination and issuance.    ASSESSMENT:  CLINICAL IMPRESSION: Pt's 5x STS and SLS times have improved; not at goal yet. Limited change in pain level during session, but pt reported good tolerance for exercises presented.  Issued laminated HEP.  Pt will take to her local pool to trial prior to next/last visit.  Will plan next visit to be d/c visit per pt request. Pt has partially met her goals.    OBJECTIVE IMPAIRMENTS: decreased activity tolerance, decreased balance,  decreased endurance, decreased mobility, difficulty walking, decreased strength, impaired UE functional use, and pain.   ACTIVITY LIMITATIONS: carrying, lifting, standing, and locomotion level  PARTICIPATION LIMITATIONS: cleaning, laundry, shopping, and community activity  PERSONAL FACTORS: Time since onset of injury/illness/exacerbation and 3+ comorbidities: RA, OA, Polymyalgia rheumatica, CVA / TIA, DM, balance, R TKA,  and partial L knee replacement   are also affecting patient's functional outcome.   REHAB POTENTIAL: Fair time of onset and multiple  co-morbidities  CLINICAL DECISION MAKING: Evolving/moderate complexity  EVALUATION COMPLEXITY: Moderate   GOALS:   SHORT TERM GOALS: Pt will tolerate aquatic therapy without adverse effects for improved tolerance to activity and pain.  Baseline: Goal status: Met 11/15/22 Target date: 11/23/2022    2.  Pt will tolerate progression of aquatic exercises without significant pain in order for improved strength, function, and tolerance to activity.  Baseline:  Goal status: MET 11/18/22 Target date: 12/07/2022   3.  Pt will perform 5x STS test in no > 12 sec for improved functional LE strength and performance of transfers.  Baseline: 17s at eval;  14s 11/18/22 Goal status: ONGOING  Target date:  11/30/2022   4.  Pt will report at least a 25% improvement in pain and sx's overall for improved performance of daily activities and daily mobility.  Baseline: no improvement reported -11/18/22 Goal status:ONGOING    LONG TERM GOALS: Target date: 12/21/2022    Pt will perform SLS at least 5 sec bilat for improved proprioception and functional stability with daily mobility.    Baseline: 4 sec bilat 11/18/22 Goal status:ONGOING   2.  Pt will demonstrate and report improved steadiness with gait.   Baseline:  Goal status: INITIAL  3.  Pt will report at least a 60-70% improvement in pain and sx's overall. Baseline:  Goal status: INITIAL  4.  Pt will be independent with aquatic HEP for improved strength, pain, and function.   Baseline: issued HEP 11/18/22 Goal status: ONGOING   5.  Pt will report at least a 50% improvement in tolerance with performing household chores.  Baseline:  Goal status: INITIAL     PLAN:  PT FREQUENCY: 2x/week  PT DURATION: other: 10 weeks overall due to schedule availability for the pool though probably 6 weeks of treatment  PLANNED INTERVENTIONS: Therapeutic exercises, Therapeutic activity, Neuromuscular re-education, Balance training, Gait training,  Patient/Family education, Self Care, Stair training, Aquatic Therapy, Dry Needling, Electrical stimulation, Cryotherapy, Moist heat, Taping, Ultrasound, Manual therapy, and Re-evaluation  PLAN FOR NEXT SESSION: d/c visit per pt request.   Mayer CamelJennifer Carlson-Long, PTA 11/18/22 11:36 AM [facility] [address] [place], [place], [address] Phone: [phone]   Fax:  [phone]

## 2022-11-22 ENCOUNTER — Ambulatory Visit (HOSPITAL_BASED_OUTPATIENT_CLINIC_OR_DEPARTMENT_OTHER): Payer: PPO | Admitting: Physical Therapy

## 2022-11-25 ENCOUNTER — Encounter (HOSPITAL_BASED_OUTPATIENT_CLINIC_OR_DEPARTMENT_OTHER): Payer: Self-pay | Admitting: Physical Therapy

## 2022-11-25 ENCOUNTER — Ambulatory Visit (HOSPITAL_BASED_OUTPATIENT_CLINIC_OR_DEPARTMENT_OTHER): Payer: PPO | Admitting: Physical Therapy

## 2022-11-25 DIAGNOSIS — M6281 Muscle weakness (generalized): Secondary | ICD-10-CM

## 2022-11-25 DIAGNOSIS — M25511 Pain in right shoulder: Secondary | ICD-10-CM | POA: Diagnosis not present

## 2022-11-25 DIAGNOSIS — M5459 Other low back pain: Secondary | ICD-10-CM

## 2022-11-25 DIAGNOSIS — R278 Other lack of coordination: Secondary | ICD-10-CM

## 2022-11-25 NOTE — Therapy (Signed)
OUTPATIENT PHYSICAL THERAPY LOWER EXTREMITY  TREATMENT  PHYSICAL THERAPY DISCHARGE SUMMARY  Visits from Start of Care: 6  Current functional level related to goals / functional outcomes: Pt is safe and indep with ADL's and functional mobility   Remaining deficits: Chronic pain   Education / Equipment: Management of condition/HEP   Patient agrees to discharge. Patient goals were partially met. Patient is being discharged due to the patient's request.  Patient Name: Misty Morris MRN: [id number] DOB:1951/07/03, 72 y.o., female Today's Date: 11/25/2022   PT End of Session - 11/25/22 1052     Visit Number 6    Number of Visits 12    Date for PT Re-Evaluation 12/21/22    Authorization Type HTA    PT Start Time 1034    PT Stop Time 1115    PT Time Calculation (min) 41 min    Activity Tolerance Patient tolerated treatment well    Behavior During Therapy [facility] for tasks assessed/performed                Past Medical History:  Diagnosis Date   Fibromyalgia    GERD (gastroesophageal reflux disease)    Hypercholesterolemia    Hypertension    Polymyalgia rheumatica    Rheumatoid arthritis    "atypical/MD 08/2017" (02/14/2018)   Shingles    Stroke 08/2017   "no residual" (02/14/2018)   TIA (transient ischemic attack) 02/14/2018   Type 2 diabetes, diet controlled    Vision impairment    right eye   Past Surgical History:  Procedure Laterality Date   CATARACT EXTRACTION W/ INTRAOCULAR LENS  IMPLANT, BILATERAL     COLONOSCOPY WITH PROPOFOL N/A 10/12/2019   Procedure: COLONOSCOPY WITH PROPOFOL;  Surgeon: Jeani Hawking, MD;  [facility]: [facility];  Service: Endoscopy;  Laterality: N/A;   HEMOSTASIS CLIP PLACEMENT  10/12/2019   Procedure: HEMOSTASIS CLIP PLACEMENT;  Surgeon: Jeani Hawking, MD;  [facility]: [facility];  Service: Endoscopy;;   JOINT REPLACEMENT     OPEN REDUCTION LE FORT I FRACTURE  1980s   PARTIAL KNEE ARTHROPLASTY Left 01/17/2017   Procedure:  UNICOMPARTMENTAL LEFT KNEE;  Surgeon: Durene Romans, MD;  [facility]: [facility];  Service: Orthopedics;  Laterality: Left;   POLYPECTOMY  10/12/2019   Procedure: POLYPECTOMY;  Surgeon: Jeani Hawking, MD;  [facility];  Service: Endoscopy;;   RADIOLOGY WITH ANESTHESIA N/A 08/11/2017   Procedure: MRI OF BRAIN WITH AND WITHOUT CONSTRAST, AND OF THE ORBIT WITH AND WITHOUT CONTRAST;  Surgeon: Radiologist, Medication, MD;  Location: [facility];  Service: Radiology;  Laterality: N/A;   RADIOLOGY WITH ANESTHESIA N/A 09/06/2017   Procedure: MRI OF BRAIN;  Surgeon: Radiologist, Medication, MD;  Location: [facility];  Service: Radiology;  Laterality: N/A;   RHINOPLASTY     TOTAL KNEE ARTHROPLASTY Right 09/30/2015   Procedure: TOTAL RIGHT KNEE ARTHROPLASTY;  Surgeon: Durene Romans, MD;  [facility];  Service: Orthopedics;  Laterality: Right;   VAGINAL HYSTERECTOMY     Patient Active Problem List   Diagnosis Date Noted   UTI (urinary tract infection) 10/07/2018   Sepsis 10/07/2018   Hypokalemia 10/07/2018   S/P total thyroidectomy 10/07/2018   Rheumatoid arthritis 09/18/2018   Papillary thyroid carcinoma 08/24/2018   TIA (transient ischemic attack) 02/14/2018   Hyperlipidemia    Ischemic optic neuropathy of right eye    Stroke 09/04/2017   HTN (hypertension) 09/04/2017   DM (diabetes mellitus) 09/04/2017   Polymyalgia rheumatica 09/04/2017   Fibromyalgia 09/04/2017   S/P left UKR 01/17/2017  Overweight (BMI 25.0-29.9) 10/01/2015   S/P right TKA 09/30/2015    PCP: Irena Reichmann, DO  REFERRING PROVIDER: Casimer Lanius, MD  REFERRING DIAG: M79.7 (ICD-10-CM) - Fibromyalgia  THERAPY DIAG:  Bilateral shoulder pain, unspecified chronicity  Other low back pain  Muscle weakness (generalized)  Other lack of coordination  Rationale for Evaluation and Treatment: Rehabilitation  ONSET DATE: PT order 09/02/2022  SUBJECTIVE:   SUBJECTIVE STATEMENT: "[facility].   No questions. I did have to modify some of the exercises some".   PERTINENT HISTORY: RA, OA in cervical, hands, and lumbar, Polymyalgia rheumatica, CVA / TIA in 2019, DM type 2, HTN Pt states her balance is a little off and her peripheral vision is off R TKA 2017, partial L knee replacement 2018,  PAIN:  Are you having pain? Yes Location: Generalized  Pain rating:   7-8/10   PRECAUTIONS: Other: RA, OA, CVA/TIA, balance, TKA  WEIGHT BEARING RESTRICTIONS: No  FALLS:  Has patient fallen in last 6 months? No  LIVING ENVIRONMENT: Lives with: lives alone Lives in: 1 story home Stairs:   2 steps to enter home and a post to hold on to Has following equipment at home: Single point cane and walker  OCCUPATION: Pt is retired.   PLOF: Independent; Pt's sx's have been worsening over the years which affects her mobility and performance of daily activities.   PATIENT GOALS: improved pain, tolerance to activity, and performance of household chores   OBJECTIVE:   DIAGNOSTIC FINDINGS: N/A  PATIENT SURVEYS:  FOTO 44 with a goal of 52 at visit #11  COGNITION: Overall cognitive status: Within functional limits for tasks assessed      UE ROM: Shoulder flexion and Abduction:  WFL Elbow flexion: WFL   UPPER EXTREMITY STRENGTH:  Strength Right eval Left eval  Shouder flexion 5/5 5/5  Shoulder Abduction 5/5 5/5  Hip abduction    Hip adduction    Shoulder internal rotation [facility] WFL  Shoulder external rotation Weak and painful Stronger on Left though still has weakness  Elbow flexion 5/5 5/5  Elbow extension 4+/5 4+/5  Ankle dorsiflexion    Ankle plantarflexion    Ankle inversion    Ankle eversion     (Blank rows = not tested)  LOWER EXTREMITY MMT:  MMT Right eval Left eval  Hip flexion 5/5 5/5  Hip extension    Hip abduction 4/5 5/5  Hip adduction    Hip internal rotation    Hip external rotation 4+/5 4+/5  Knee flexion 5/5 seated 5/5 seated  Knee extension 5/5 5/5   Ankle dorsiflexion 5/5 5/5  Ankle plantarflexion WFL seated WFL seated  Ankle inversion    Ankle eversion     (Blank rows = not tested)    FUNCTIONAL TESTS:  5x STS test:  17 seconds without Ue's 11/18/22:  5x STS = 14.71s without Ues 11/25/22 5x STS = 13.02 BALANCE: SLS:  Pt unable to perform. Tandem stance 3-4 sec with SBA, not steady.  11/18/22: SLS,  4 sec each LE  GAIT: Comments: Pt ambulates without an AD.  Pt had occasions of unsteadiness that she self corrected.  She has limited arm swing on R, normal on L.    TODAY'S TREATMENT:  Pt seen for aquatic therapy today.  Treatment took place in water 3.5-4. ft in depth at the [facility] pool. Temp of water was 91.  Pt entered/exited the pool via stairs independently with bilat rail.  * without UE support:  walking forward/backward,  * side stepping with rainbow hand floats and arm addct * with rainbow hand floats: staggered stance and tricep press downs x 10;  * holding yellow noodle: tandem gait forward/ backward  * holding wall:  heel/toe raises x 10;  single leg clam x 10 each;  * TrA set with solid noodle pull down x 10 * staggered stance with kick board row x 10 each LE forward (cues to increase speed for more challenge) *straddling noodle: cycling;  jumping jack LEs;  skiing  * mini squat at wall x 10;  Leg swings in flex/ext   Pt requires the buoyancy and hydrostatic pressure of water for support, and to offload joints by unweighting joint load by at least 50 % in navel deep water and by at least 75-80% in chest to neck deep water.  Viscosity of the water is needed for resistance of strengthening. Water current perturbations provides challenge to standing balance requiring increased core activation.    PATIENT EDUCATION:  Education details: aquatic progressions and modifications    Person educated: Patient Education method: Explanation Education comprehension: verbalized understanding  HOME EXERCISE PROGRAM: Access Code: [email or web address] URL: [email or web address] Date: 11/18/2022 Prepared by: [facility]  Exercises - Forward and Backward Walking with Hand Floats  - 1 x daily - 7 x weekly - Side Stepping with Hand Floats  - 1 x daily - 7 x weekly - Walking Tandem Stance  - 1 x daily - 7 x weekly - 3 sets - 10 reps - Mini Squat with Counter Support  - 1 x daily - 7 x weekly - 1-2 sets - 10 reps - Standing Hip Abduction  - 1 x daily - 7 x weekly - 1-2 sets - 10 reps - Heel Toe Raises with Unilateral Counter Support  - 1 x daily - 7 x weekly - 1-2 sets - 10 reps - Shoulder Extension with Pool Noodle Pull Down  - 1 x daily - 7 x weekly - 1-2 sets - 10 reps - Leg Swing Single Leg Balance  - 1 x daily - 7 x weekly - 1-2 sets - 10 reps - Single Leg Clamshell - Hold Wall   - 1 x daily - 7 x weekly - 1-2 sets - 10 reps - Staggered Stance Row with Kick Board  - 1 x daily - 7 x weekly - 1-2 sets - 10 reps - Seated Straddle on Flotation Forward Breast Stroke Arms and Bicycle Legs  - 1 x daily - 7 x weekly  * This aquatic home exercise program from [facility] utilizes pictures from land based exercises, but has been adapted prior to lamination and issuance.    ASSESSMENT:  CLINICAL IMPRESSION: Pt directed through final aquatic HEP to ensure understanding and indep. She requires minor cues for execution, modification of ue support slightly and clarification of written program. Pt reports she has not had any overall reduction in pain sensitivity due to fibromyalgia. Pt has requesting DC as she believes she has learned what she needs to continue indep. Not all goals met due to her chronic fibromyalgia and her early unexpected DC.  She will complete HEP indep at personal pool access. She is encouraged to email with  any  questions.    OBJECTIVE IMPAIRMENTS: decreased activity tolerance, decreased balance, decreased endurance, decreased mobility, difficulty walking, decreased strength, impaired UE functional use, and pain.   ACTIVITY LIMITATIONS: carrying, lifting, standing, and locomotion level  PARTICIPATION LIMITATIONS: cleaning, laundry, shopping, and community activity  PERSONAL FACTORS: Time since onset of injury/illness/exacerbation and 3+ comorbidities: RA, OA, Polymyalgia rheumatica, CVA / TIA, DM, balance, R TKA,  and partial L knee replacement   are also affecting patient's functional outcome.   REHAB POTENTIAL: Fair time of onset and multiple co-morbidities  CLINICAL DECISION MAKING: Evolving/moderate complexity  EVALUATION COMPLEXITY: Moderate   GOALS:   SHORT TERM GOALS: Pt will tolerate aquatic therapy without adverse effects for improved tolerance to activity and pain.  Baseline: Goal status: Met 11/15/22 Target date: 11/23/2022    2.  Pt will tolerate progression of aquatic exercises without significant pain in order for improved strength, function, and tolerance to activity.  Baseline:  Goal status: MET 11/18/22 Target date: 12/07/2022   3.  Pt will perform 5x STS test in no > 12 sec for improved functional LE strength and performance of transfers.  Baseline: 17s at eval;  14s 11/18/22;  13.01 11/25/22 Goal status: ONGOING / Not met 11/25/22 Target date:  11/30/2022   4.  Pt will report at least a 25% improvement in pain and sx's overall for improved performance of daily activities and daily mobility.  Baseline: no improvement reported -11/18/22 Goal status:ONGOING / Not met 11/25/18   LONG TERM GOALS: Target date: 12/21/2022    Pt will perform SLS at least 5 sec bilat for improved proprioception and functional stability with daily mobility.    Baseline: 4 sec bilat 11/18/22 Goal status:ONGOING / Not met 11/25/22  2.  Pt will demonstrate and report improved steadiness with  gait.   Baseline:  Goal status:  Not met 11/25/22  3.  Pt will report at least a 60-70% improvement in pain and sx's overall. Baseline:  Goal status: Not met 11/25/22  4.  Pt will be independent with aquatic HEP for improved strength, pain, and function.   Baseline: issued HEP 11/18/22 Goal status: Met 11/25/22  5.  Pt will report at least a 50% improvement in tolerance with performing household chores.  Baseline:  Goal status: Not met 11/25/22     PLAN:  PT FREQUENCY: 2x/week  PT DURATION: other: 10 weeks overall due to schedule availability for the pool though probably 6 weeks of treatment  PLANNED INTERVENTIONS: Therapeutic exercises, Therapeutic activity, Neuromuscular re-education, Balance training, Gait training, Patient/Family education, Self Care, Stair training, Aquatic Therapy, Dry Needling, Electrical stimulation, Cryotherapy, Moist heat, Taping, Ultrasound, Manual therapy, and Re-evaluation  PLAN FOR NEXT SESSION: d/c visit per pt request.   Corrie Dandy Tomma Lightning) Mccauley Diehl MPT 11/25/22 10:53 AM [facility] [address] [place], [place], [address] Phone: [phone]   Fax:  [phone]

## 2022-11-29 ENCOUNTER — Ambulatory Visit (HOSPITAL_BASED_OUTPATIENT_CLINIC_OR_DEPARTMENT_OTHER): Payer: PPO | Admitting: Physical Therapy

## 2022-12-02 ENCOUNTER — Ambulatory Visit (HOSPITAL_BASED_OUTPATIENT_CLINIC_OR_DEPARTMENT_OTHER): Payer: PPO | Admitting: Physical Therapy

## 2022-12-06 ENCOUNTER — Ambulatory Visit (HOSPITAL_BASED_OUTPATIENT_CLINIC_OR_DEPARTMENT_OTHER): Payer: PPO | Admitting: Physical Therapy

## 2022-12-15 DIAGNOSIS — N952 Postmenopausal atrophic vaginitis: Secondary | ICD-10-CM | POA: Diagnosis not present

## 2022-12-15 DIAGNOSIS — N3000 Acute cystitis without hematuria: Secondary | ICD-10-CM | POA: Diagnosis not present

## 2022-12-16 ENCOUNTER — Encounter (HOSPITAL_COMMUNITY): Payer: Self-pay | Admitting: Gastroenterology

## 2022-12-16 ENCOUNTER — Ambulatory Visit (INDEPENDENT_AMBULATORY_CARE_PROVIDER_SITE_OTHER): Payer: PPO | Admitting: Orthopaedic Surgery

## 2022-12-16 ENCOUNTER — Encounter (HOSPITAL_BASED_OUTPATIENT_CLINIC_OR_DEPARTMENT_OTHER): Payer: Self-pay | Admitting: Orthopaedic Surgery

## 2022-12-16 ENCOUNTER — Ambulatory Visit (HOSPITAL_BASED_OUTPATIENT_CLINIC_OR_DEPARTMENT_OTHER): Payer: PPO

## 2022-12-16 DIAGNOSIS — G8929 Other chronic pain: Secondary | ICD-10-CM

## 2022-12-16 DIAGNOSIS — M25511 Pain in right shoulder: Secondary | ICD-10-CM | POA: Diagnosis not present

## 2022-12-16 NOTE — Progress Notes (Signed)
Anesthesia Review:  PCP: Irena Reichmann  Cardiologist : none  Chest x-ray : EKG : Echo : Stress test: Cardiac Cath :  Activity level: can do a  flight of stairs without difficulty  Sleep Study/ CPAP : none  Fasting Blood Sugar :      / Checks Blood Sugar -- times a day:   Blood Thinner/ Instructions /Last Dose: ASA / Instructions/ Last Dose :    Plavix- Stop 7 days prior per pt    DM- type 2- diet controlled on no meds checks glucose at least daily in am  Latex allergy  Contact lens in  left eye  PT has bowel prep instructions per MD>

## 2022-12-16 NOTE — Progress Notes (Signed)
Chief Complaint: Right shoulder pain     History of Present Illness:    Misty Morris is a 72 y.o. female right-hand-dominant female presents with ongoing right shoulder pain over the course of the last several years.  She has previously undergone physical therapy particularly aquatic therapy at [facility] with little relief of the right shoulder.  She states that she does have good motion although overhead activity does cause pain.  This is limiting her and being active.  She is a retired Engineer, civil (consulting).  She has not previously had any injections.  She has trialed anti-inflammatories without relief    Surgical History:   None  PMH/PSH/Family History/Social History/Meds/Allergies:    Past Medical History:  Diagnosis Date   Cancer (HCC)    hx of thyroid cancer   Fibromyalgia    Hypercholesterolemia    Hypertension    Polymyalgia rheumatica (HCC)    Rheumatoid arthritis (HCC)    "atypical/MD 08/2017" (02/14/2018)   Shingles    Stroke (HCC) 08/2017   "no residual" (02/14/2018)   TIA (transient ischemic attack) 02/14/2018   Type 2 diabetes, diet controlled (HCC)    Vision impairment    right eye   Past Surgical History:  Procedure Laterality Date   CATARACT EXTRACTION W/ INTRAOCULAR LENS  IMPLANT, BILATERAL     COLONOSCOPY WITH PROPOFOL N/A 10/12/2019   Procedure: COLONOSCOPY WITH PROPOFOL;  Surgeon: Jeani Hawking, MD;  [facility];  Service: Endoscopy;  Laterality: N/A;   HEMOSTASIS CLIP PLACEMENT  10/12/2019   Procedure: HEMOSTASIS CLIP PLACEMENT;  Surgeon: Jeani Hawking, MD;  [facility];  Service: Endoscopy;;   JOINT REPLACEMENT     OPEN REDUCTION LE FORT I FRACTURE  1980s   PARTIAL KNEE ARTHROPLASTY Left 01/17/2017   Procedure: UNICOMPARTMENTAL LEFT KNEE;  Surgeon: Durene Romans, MD;  [facility];  Service: Orthopedics;  Laterality: Left;   POLYPECTOMY  10/12/2019   Procedure: POLYPECTOMY;  Surgeon: Jeani Hawking, MD;   [facility];  Service: Endoscopy;;   RADIOLOGY WITH ANESTHESIA N/A 08/11/2017   Procedure: MRI OF BRAIN WITH AND WITHOUT CONSTRAST, AND OF THE ORBIT WITH AND WITHOUT CONTRAST;  Surgeon: Radiologist, Medication, MD;  [facility];  Service: Radiology;  Laterality: N/A;   RADIOLOGY WITH ANESTHESIA N/A 09/06/2017   Procedure: MRI OF BRAIN;  Surgeon: Radiologist, Medication, MD;  [facility];  Service: Radiology;  Laterality: N/A;   RHINOPLASTY     TOTAL KNEE ARTHROPLASTY Right 09/30/2015   Procedure: TOTAL RIGHT KNEE ARTHROPLASTY;  Surgeon: Durene Romans, MD;  [facility];  Service: Orthopedics;  Laterality: Right;   VAGINAL HYSTERECTOMY     Social History   Socioeconomic History   Marital status: Single    Spouse name: Not on file   Number of children: Not on file   Years of education: Not on file   Highest education level: Not on file  Occupational History   Not on file  Tobacco Use   Smoking status: Former    Packs/day: 1.00    Years: 25.00    Additional pack years: 0.00    Total pack years: 25.00    Types: Cigarettes    Quit date: 12/27/2011    Years since quitting: 10.9   Smokeless tobacco: Never  Vaping Use   Vaping Use: Never used  Substance and Sexual Activity   Alcohol use: Never   Drug use: Yes    Types: Marijuana    Comment: on occas   Sexual activity: Not Currently  Other Topics Concern   Not on file  Social History Narrative   Not on file   Social Determinants of Health   Financial Resource Strain: Not on file  Food Insecurity: Not on file  Transportation Needs: Not on file  Physical Activity: Not on file  Stress: Not on file  Social Connections: Not on file   Family History  Problem Relation Age of Onset   Hypertension Mother    Seizures Mother    Hypertension Father    Hypertension Sister    Pancreatic cancer Other    Allergies  Allergen Reactions   Codeine Hives   Demerol [Meperidine] Hives   Latex Dermatitis and Other (See  Comments)    Blistering    Morphine And Related Hives   Sulfa Antibiotics Hives   Tramadol Hives   Vicodin [Hydrocodone-Acetaminophen] Itching    Elevated blood pressure. Tolerates low doses.   Current Outpatient Medications  Medication Sig Dispense Refill   Acetylcarn-Alpha Lipoic Acid 400-200 MG CAPS Take 1 capsule by mouth 2 (two) times daily.     alendronate (FOSAMAX) 70 MG tablet Take 70 mg by mouth every Friday. Take with a full glass of water on an empty stomach.     amLODipine (NORVASC) 5 MG tablet Take 5 mg by mouth every evening.   2   Calcium-Magnesium-Vitamin D (CALCIUM 1200+D3 PO) Take 1 tablet by mouth at bedtime.     cholecalciferol (VITAMIN D3) 25 MCG (1000 UNIT) tablet Take 2,000 Units by mouth daily.     Chromium Picolinate 200 MCG CAPS Take 200 mcg by mouth daily.     clopidogrel (PLAVIX) 75 MG tablet Take 1 tablet (75 mg total) by mouth daily. 30 tablet 3   Coenzyme Q10 (COQ-10) 100 MG CAPS Take 100 mg by mouth daily.     diphenhydrAMINE (BENADRYL) 25 MG tablet Take 25 mg by mouth at bedtime.     levothyroxine (SYNTHROID) 125 MCG tablet Take 125 mcg by mouth daily before breakfast.     LYSINE PO Take 650 mg by mouth daily.     moxifloxacin (VIGAMOX) 0.5 % ophthalmic solution Place 1 drop into the left eye daily.     Polyvinyl Alcohol-Povidone (REFRESH OP) Place 1 drop into the left eye in the morning, at noon, and at bedtime.     predniSONE (DELTASONE) 1 MG tablet Take 1 mg by mouth every evening. Take with 5 mg to equal 6 mg once daily .  Takes 4     predniSONE (DELTASONE) 5 MG tablet Take 5 mg by mouth every evening. Take with 1 mg to equal 6 mg once daily (Patient not taking: Reported on 10/12/2022)     rosuvastatin (CRESTOR) 20 MG tablet Take 1 tablet (20 mg total) by mouth daily at 6 PM. 30 tablet 3   timolol (TIMOPTIC) 0.25 % ophthalmic solution Place 1 drop into both eyes daily.     ZIOPTAN 0.0015 % SOLN Place 1 drop into both eyes at bedtime.  3   No current  facility-administered medications for this visit.   No results found.  Review of Systems:   A ROS was performed including pertinent positives and negatives as documented in the HPI.  Physical Exam :   Constitutional: NAD and appears stated age Neurological: Alert and oriented Psych: Appropriate affect  and cooperative There were no vitals taken for this visit.   Comprehensive Musculoskeletal Exam:    Musculoskeletal Exam    Inspection Right Left  Skin No atrophy or winging No atrophy or winging  Palpation    Tenderness Lateral deltoid None  Range of Motion    Flexion (passive) 170 170  Flexion (active) 170 170  Abduction 170 170  ER at the side 70 70  Can reach behind back to T12 T12  Strength     Weakness with forward elevation, negative belly press None  Special Tests    Pseudoparalytic No No  Neurologic    Fires PIN, radial, median, ulnar, musculocutaneous, axillary, suprascapular, long thoracic, and spinal accessory innervated muscles. No abnormal sensibility  Vascular/Lymphatic    Radial Pulse 2+ 2+  Cervical Exam    Patient has symmetric cervical range of motion with negative Spurling's test.  Special Test: Positive Neer impingement     Imaging:   Xray (3 views right shoulder): Sclerosis of the greater tuberosity with very mild degenerative findings about the glenohumeral joint    I personally reviewed and interpreted the radiographs.   Assessment:   72 y.o. female right-hand-dominant female with right shoulder pain consistent with possible rotator cuff tear.  I do believe that given the fact that she has failed physical therapy at this time an MRI would be useful to help further counsel her to decide on the next steps.  She does have very severe claustrophobia and to that effect we will need to order a sedated MRI.  Will plan to proceed with this and I will see her back following discuss results  Plan :    -Plan for sedated MRI and follow-up to discuss  results     I personally saw and evaluated the patient, and participated in the management and treatment plan.  Huel Cote, MD Attending Physician, Orthopedic Surgery  This document was dictated using Dragon voice recognition software. A reasonable attempt at proof reading has been made to minimize errors.

## 2022-12-16 NOTE — Progress Notes (Addendum)
Anesthesia Review:  PCP: Cardiologist : Chest x-ray : EKG : Echo : Stress test: Cardiac Cath :  Activity level:  Sleep Study/ CPAP : Fasting Blood Sugar :      / Checks Blood Sugar -- times a day:   Blood Thinner/ Instructions /Last Dose: ASA / Instructions/ Last Dose :  Plavix    Latex Allergy    DM- type 2   LVMM on 12/16/22 at 0900am.

## 2022-12-23 ENCOUNTER — Telehealth: Payer: Self-pay | Admitting: Orthopaedic Surgery

## 2022-12-23 NOTE — Telephone Encounter (Signed)
Faxed sedation form to [facility] will contact pt to schedule appt

## 2022-12-23 NOTE — Telephone Encounter (Signed)
Patient called in stating she needs her MRI moved to the hospital she needs an open MRI please advise

## 2022-12-24 ENCOUNTER — Ambulatory Visit (HOSPITAL_COMMUNITY): Payer: PPO | Admitting: Anesthesiology

## 2022-12-24 ENCOUNTER — Ambulatory Visit (HOSPITAL_COMMUNITY)
Admission: RE | Admit: 2022-12-24 | Discharge: 2022-12-24 | Disposition: A | Discharge status: Home / Self Care | Payer: PPO | Attending: Gastroenterology | Admitting: Gastroenterology

## 2022-12-24 ENCOUNTER — Other Ambulatory Visit: Payer: Self-pay

## 2022-12-24 ENCOUNTER — Encounter (HOSPITAL_COMMUNITY): Payer: Self-pay | Admitting: Gastroenterology

## 2022-12-24 ENCOUNTER — Encounter (HOSPITAL_COMMUNITY)
Admission: RE | Disposition: A | Discharge status: Home / Self Care | Payer: Self-pay | Source: Home / Self Care | Attending: Gastroenterology

## 2022-12-24 ENCOUNTER — Ambulatory Visit (HOSPITAL_BASED_OUTPATIENT_CLINIC_OR_DEPARTMENT_OTHER): Payer: PPO | Admitting: Anesthesiology

## 2022-12-24 DIAGNOSIS — K5730 Diverticulosis of large intestine without perforation or abscess without bleeding: Secondary | ICD-10-CM | POA: Diagnosis not present

## 2022-12-24 DIAGNOSIS — I10 Essential (primary) hypertension: Secondary | ICD-10-CM

## 2022-12-24 DIAGNOSIS — E119 Type 2 diabetes mellitus without complications: Secondary | ICD-10-CM

## 2022-12-24 DIAGNOSIS — Z1211 Encounter for screening for malignant neoplasm of colon: Secondary | ICD-10-CM | POA: Diagnosis not present

## 2022-12-24 DIAGNOSIS — Z87891 Personal history of nicotine dependence: Secondary | ICD-10-CM

## 2022-12-24 DIAGNOSIS — Z86010 Personal history of colonic polyps: Secondary | ICD-10-CM | POA: Diagnosis not present

## 2022-12-24 HISTORY — DX: Malignant (primary) neoplasm, unspecified: C80.1

## 2022-12-24 HISTORY — PX: COLONOSCOPY WITH PROPOFOL: SHX5780

## 2022-12-24 LAB — GLUCOSE, CAPILLARY
Glucose-Capillary: 67 mg/dL — ABNORMAL LOW (ref 70–99)
Glucose-Capillary: 139 mg/dL — ABNORMAL HIGH (ref 70–99)
Glucose-Capillary: 81 mg/dL (ref 70–99)

## 2022-12-24 SURGERY — COLONOSCOPY WITH PROPOFOL
Anesthesia: Monitor Anesthesia Care

## 2022-12-24 MED ORDER — DEXTROSE 50 % IV SOLN
INTRAVENOUS | Status: AC
  Filled 2022-12-24: qty 50

## 2022-12-24 MED ORDER — SODIUM CHLORIDE 0.9 % IV SOLN: INTRAVENOUS | Status: DC

## 2022-12-24 MED ORDER — PROPOFOL 500 MG/50ML IV EMUL
INTRAVENOUS | Status: DC | PRN
  Administered 2022-12-24: 75 ug/kg/min via INTRAVENOUS

## 2022-12-24 MED ORDER — LACTATED RINGERS IV SOLN: INTRAVENOUS | Status: DC

## 2022-12-24 MED ORDER — DEXTROSE 50 % IV SOLN
12.5000 g | INTRAVENOUS | Status: AC
  Administered 2022-12-24: 12.5 g via INTRAVENOUS

## 2022-12-24 MED ORDER — PROPOFOL 10 MG/ML IV BOLUS
INTRAVENOUS | Status: DC | PRN
  Administered 2022-12-24 (×2): 40 mg via INTRAVENOUS

## 2022-12-24 MED ORDER — LACTATED RINGERS IV SOLN: INTRAVENOUS | Status: DC | PRN

## 2022-12-24 SURGICAL SUPPLY — 22 items

## 2022-12-24 NOTE — Progress Notes (Addendum)
Hypoglycemic Event  CBG: 67   Treatment: D50 25 mL (12.5 gm)  Symptoms: None  Follow-up CBG: Time:1043 CBG Result:139  Possible Reasons for Event: Inadequate meal intake - NPO for colonoscopy  Comments/MD notified:MDA Stoltzfus notified. Okay with 12.5 g of D50 per hypoglycemia protocol.    Eulas Post 12/24/22 10:43 AM  Addendum 1157: Post-operative blood sugar 81.  Eulas Post, RN 12/24/22 11:57 AM

## 2022-12-24 NOTE — H&P (Signed)
Misty Morris HPI:  Her colonoscopy on 10/12/2019 was positive for three adenomas.  The rectal adenoma was 15 mm and pedunculated with HGD.  The stalk was negative for any adenomatous change.  The patient had a colonoscopy at that time as her Cologuard was positive.  Over this interval time period she denies any problems with hematochezia, melena, diarrhea, constipation, chest pain, SOB, or MI.  Past Medical History:  Diagnosis Date   Cancer (HCC)    hx of thyroid cancer   Fibromyalgia    Hypercholesterolemia    Hypertension    Polymyalgia rheumatica (HCC)    Rheumatoid arthritis (HCC)    "atypical/MD 08/2017" (02/14/2018)   Shingles    Stroke (HCC) 08/2017   "no residual" (02/14/2018)   TIA (transient ischemic attack) 02/14/2018   Type 2 diabetes, diet controlled (HCC)    Vision impairment    right eye    Past Surgical History:  Procedure Laterality Date   CATARACT EXTRACTION W/ INTRAOCULAR LENS  IMPLANT, BILATERAL     COLONOSCOPY WITH PROPOFOL N/A 10/12/2019   Procedure: COLONOSCOPY WITH PROPOFOL;  Surgeon: Misty Hawking, MD;  Location: [facility];  Service: Endoscopy;  Laterality: N/A;   HEMOSTASIS CLIP PLACEMENT  10/12/2019   Procedure: HEMOSTASIS CLIP PLACEMENT;  Surgeon: Misty Hawking, MD;  Location: [facility];  Service: Endoscopy;;   JOINT REPLACEMENT     OPEN REDUCTION LE FORT I FRACTURE  1980s   PARTIAL KNEE ARTHROPLASTY Left 01/17/2017   Procedure: UNICOMPARTMENTAL LEFT KNEE;  Surgeon: Durene Romans, MD;  Location: [facility];  Service: Orthopedics;  Laterality: Left;   POLYPECTOMY  10/12/2019   Procedure: POLYPECTOMY;  Surgeon: Misty Hawking, MD;  Location: [facility];  Service: Endoscopy;;   RADIOLOGY WITH ANESTHESIA N/A 08/11/2017   Procedure: MRI OF BRAIN WITH AND WITHOUT CONSTRAST, AND OF THE ORBIT WITH AND WITHOUT CONTRAST;  Surgeon: Radiologist, Medication, MD;  Location: [facility];  Service: Radiology;  Laterality: N/A;   RADIOLOGY WITH ANESTHESIA N/A 09/06/2017   Procedure:  MRI OF BRAIN;  Surgeon: Radiologist, Medication, MD;  Location: [facility];  Service: Radiology;  Laterality: N/A;   RHINOPLASTY     TOTAL KNEE ARTHROPLASTY Right 09/30/2015   Procedure: TOTAL RIGHT KNEE ARTHROPLASTY;  Surgeon: Durene Romans, MD;  Location: [facility];  Service: Orthopedics;  Laterality: Right;   VAGINAL HYSTERECTOMY      Family History  Problem Relation Age of Onset   Hypertension Mother    Seizures Mother    Hypertension Father    Hypertension Sister    Pancreatic cancer Other     Social History:  reports that she quit smoking about 11 years ago. Her smoking use included cigarettes. She has a 25.00 pack-year smoking history. She has never used smokeless tobacco. She reports current drug use. Drug: Marijuana. She reports that she does not drink alcohol.  Allergies:  Allergies  Allergen Reactions   Codeine Hives   Demerol [Meperidine] Hives   Latex Dermatitis and Other (See Comments)    Blistering    Morphine And Codeine Hives   Sulfa Antibiotics Hives   Tramadol Hives   Vicodin [Hydrocodone-Acetaminophen] Itching    Elevated blood pressure. Tolerates low doses.    Medications: Scheduled: Continuous:  sodium chloride     lactated ringers 50 mL/hr at 12/24/22 1021    Results for orders placed or performed during the hospital encounter of 12/24/22 (from the past 24 hour(s))  Glucose, capillary     Status: Abnormal   Collection Time: 12/24/22  10:17 AM  Result Value Ref Range   Glucose-Capillary 67 (L) 70 - 99 mg/dL     No results found.  ROS:  As stated above in the HPI otherwise negative.  Blood pressure (!) 186/68, pulse 67, temperature 97.7 F (36.5 C), temperature source Temporal, resp. rate 12, height 5\' 2"  (1.575 m), weight 66.2 kg, SpO2 98 %.    PE: Gen: NAD, Alert and Oriented HEENT:  [place]/AT, EOMI Neck: Supple, no LAD Lungs: CTA Bilaterally CV: RRR without M/G/R ABD: Soft, NTND, +BS Ext: No C/C/E  Assessment/Plan: 1) Personal history of polyps  - colonoscopy.  Misty Morris D 12/24/2022, 10:28 AM

## 2022-12-24 NOTE — Discharge Instructions (Signed)

## 2022-12-24 NOTE — Anesthesia Postprocedure Evaluation (Signed)
Anesthesia Post Note  Patient: Misty Morris  Procedure(s) Performed: COLONOSCOPY WITH PROPOFOL     Patient location during evaluation: PACU Anesthesia Type: MAC Level of consciousness: awake and alert Pain management: pain level controlled Vital Signs Assessment: post-procedure vital signs reviewed and stable Respiratory status: spontaneous breathing, nonlabored ventilation, respiratory function stable and patient connected to nasal cannula oxygen Cardiovascular status: stable and blood pressure returned to baseline Postop Assessment: no apparent nausea or vomiting Anesthetic complications: no   No notable events documented.  Last Vitals:  Vitals:   12/24/22 1150 12/24/22 1200  BP: 134/81 (!) 153/73  Pulse: 69 66  Resp: 16 13  Temp:    SpO2: 98% 99%    Last Pain:  Vitals:   12/24/22 1200  TempSrc:   PainSc: 0-No pain                 Earl Lites P Nariah Morgano

## 2022-12-24 NOTE — Anesthesia Preprocedure Evaluation (Addendum)
Anesthesia Evaluation  Patient identified by MRN, date of birth, ID band Patient awake    Reviewed: Allergy & Precautions, NPO status , Patient's Chart, lab work & pertinent test results  Airway Mallampati: II  TM Distance: >3 FB Neck ROM: Full    Dental no notable dental hx. (+) Edentulous Upper, Edentulous Lower   Pulmonary former smoker   Pulmonary exam normal        Cardiovascular hypertension, Pt. on medications  Rhythm:Regular Rate:Normal     Neuro/Psych CVA, No Residual Symptoms  negative psych ROS   GI/Hepatic negative GI ROS, Neg liver ROS,,,  Endo/Other  diabetes    Renal/GU negative Renal ROS  negative genitourinary   Musculoskeletal  (+) Arthritis , Rheumatoid disorders,  Fibromyalgia -  Abdominal Normal abdominal exam  (+)   Peds  Hematology negative hematology ROS (+)   Anesthesia Other Findings   Reproductive/Obstetrics                             Anesthesia Physical Anesthesia Plan  ASA: 3  Anesthesia Plan: MAC   Post-op Pain Management:    Induction:   PONV Risk Score and Plan: 2 and Treatment may vary due to age or medical condition and Propofol infusion  Airway Management Planned: Simple Face Mask and Nasal Cannula  Additional Equipment: None  Intra-op Plan:   Post-operative Plan:   Informed Consent: I have reviewed the patients History and Physical, chart, labs and discussed the procedure including the risks, benefits and alternatives for the proposed anesthesia with the patient or authorized representative who has indicated his/her understanding and acceptance.     Dental advisory given  Plan Discussed with: CRNA  Anesthesia Plan Comments:        Anesthesia Quick Evaluation

## 2022-12-24 NOTE — Transfer of Care (Signed)
Immediate Anesthesia Transfer of Care Note  Patient: Misty Morris  Procedure(s) Performed: COLONOSCOPY WITH PROPOFOL  Patient Location: PACU  Anesthesia Type:MAC  Level of Consciousness: awake and alert   Airway & Oxygen Therapy: Patient Spontanous Breathing and Patient connected to face mask oxygen  Post-op Assessment: Report given to RN and Post -op Vital signs reviewed and stable  Post vital signs: Reviewed and stable  Last Vitals:  Vitals Value Taken Time  BP 145/69 12/24/22 1144  Temp 36.5 C 12/24/22 1144  Pulse 87 12/24/22 1144  Resp 21 12/24/22 1144  SpO2 98 % 12/24/22 1144    Last Pain:  Vitals:   12/24/22 1144  TempSrc: Temporal  PainSc: 0-No pain         Complications: No notable events documented.

## 2022-12-24 NOTE — Op Note (Signed)
[facility] Patient Name: Misty Morris Procedure Date: 12/24/2022 MRN: [id number] Attending MD: Jeani Hawking , MD, [id number] Date of Birth: 03-Dec-1950 CSN: [id number] Age: 72 Admit Type: Outpatient Procedure:                Colonoscopy Indications:              High risk colon cancer surveillance: Personal                            history of colonic polyps Providers:                Jeani Hawking, MD, Norman Clay, RN, Martha Clan,                            RN, Irene Shipper, Technician, Deri Fuelling, CRNA Referring MD:             Jeani Hawking, MD Medicines:                Propofol per Anesthesia Complications:            No immediate complications. Estimated Blood Loss:     Estimated blood loss: none. Procedure:                Pre-Anesthesia Assessment:                           - Prior to the procedure, a History and Physical                            was performed, and patient medications and                            allergies were reviewed. The patient's tolerance of                            previous anesthesia was also reviewed. The risks                            and benefits of the procedure and the sedation                            options and risks were discussed with the patient.                            All questions were answered, and informed consent                            was obtained. Prior Anticoagulants: The patient has                            taken no anticoagulant or antiplatelet agents. ASA                            Grade Assessment: III - A patient with severe  systemic disease. After reviewing the risks and                            benefits, the patient was deemed in satisfactory                            condition to undergo the procedure.                           - Sedation was administered by an anesthesia                            professional. Deep sedation was attained.                            After obtaining informed consent, the colonoscope                            was passed under direct vision. Throughout the                            procedure, the patient's blood pressure, pulse, and                            oxygen saturations were monitored continuously. The                            CF-HQ190L ([id number]) Olympus colonoscope was                            introduced through the anus and advanced to the the                            cecum, identified by appendiceal orifice and                            ileocecal valve. The colonoscopy was somewhat                            difficult due to a tortuous colon. Successful                            completion of the procedure was aided by using                            manual pressure and straightening and shortening                            the scope to obtain bowel loop reduction. The                            patient tolerated the procedure well. The quality  of the bowel preparation was evaluated using the                            BBPS [facility] Bowel Preparation Scale) with scores                            of: Right Colon = 3 (entire mucosa seen well with                            no residual staining, small fragments of stool or                            opaque liquid), Transverse Colon = 2 (minor amount                            of residual staining, small fragments of stool                            and/or opaque liquid, but mucosa seen well) and                            Left Colon = 2 (minor amount of residual staining,                            small fragments of stool and/or opaque liquid, but                            mucosa seen well). The total BBPS score equals 7.                            The quality of the bowel preparation was good. The                            ileocecal valve, appendiceal orifice, and rectum                            were  photographed. Scope In: 11:17:49 AM Scope Out: 11:37:25 AM Scope Withdrawal Time: 0 hours 13 minutes 45 seconds  Total Procedure Duration: 0 hours 19 minutes 36 seconds  Findings:      Scattered large-mouthed, medium-mouthed and small-mouthed diverticula       were found in the entire colon. Impression:               - Diverticulosis in the entire examined colon.                           - No specimens collected. Moderate Sedation:      Not Applicable - Patient had care per Anesthesia. Recommendation:           - Patient has a contact number available for                            emergencies. The signs and symptoms of potential  delayed complications were discussed with the                            patient. Return to normal activities tomorrow.                            Written discharge instructions were provided to the                            patient.                           - Resume previous diet.                           - Continue present medications.                           - Repeat colonoscopy is not recommended for                            surveillance. Procedure Code(s):        --- Professional ---                           [phone], Colonoscopy, flexible; diagnostic, including                            collection of specimen(s) by brushing or washing,                            when performed (separate procedure) Diagnosis Code(s):        --- Professional ---                           Z86.010, Personal history of colonic polyps                           K57.30, Diverticulosis of large intestine without                            perforation or abscess without bleeding CPT copyright 2022 American Medical Association. All rights reserved. The codes documented in this report are preliminary and upon coder review may  be revised to meet current compliance requirements. Jeani Hawking, MD Jeani Hawking, MD 12/24/2022 11:45:27 AM This report  has been signed electronically. Number of Addenda: 0

## 2022-12-27 ENCOUNTER — Encounter (HOSPITAL_COMMUNITY): Payer: Self-pay | Admitting: Gastroenterology

## 2023-01-12 DIAGNOSIS — E119 Type 2 diabetes mellitus without complications: Secondary | ICD-10-CM | POA: Diagnosis not present

## 2023-01-12 DIAGNOSIS — I10 Essential (primary) hypertension: Secondary | ICD-10-CM | POA: Diagnosis not present

## 2023-01-12 DIAGNOSIS — M059 Rheumatoid arthritis with rheumatoid factor, unspecified: Secondary | ICD-10-CM | POA: Diagnosis not present

## 2023-01-14 ENCOUNTER — Ambulatory Visit (HOSPITAL_BASED_OUTPATIENT_CLINIC_OR_DEPARTMENT_OTHER): Payer: PPO | Admitting: Orthopaedic Surgery

## 2023-01-17 DIAGNOSIS — C73 Malignant neoplasm of thyroid gland: Secondary | ICD-10-CM | POA: Diagnosis not present

## 2023-01-17 DIAGNOSIS — E890 Postprocedural hypothyroidism: Secondary | ICD-10-CM | POA: Diagnosis not present

## 2023-01-24 DIAGNOSIS — E890 Postprocedural hypothyroidism: Secondary | ICD-10-CM | POA: Diagnosis not present

## 2023-01-24 DIAGNOSIS — C73 Malignant neoplasm of thyroid gland: Secondary | ICD-10-CM | POA: Diagnosis not present

## 2023-02-04 DIAGNOSIS — Z7952 Long term (current) use of systemic steroids: Secondary | ICD-10-CM | POA: Diagnosis not present

## 2023-02-04 DIAGNOSIS — M1990 Unspecified osteoarthritis, unspecified site: Secondary | ICD-10-CM | POA: Diagnosis not present

## 2023-02-04 DIAGNOSIS — M2550 Pain in unspecified joint: Secondary | ICD-10-CM | POA: Diagnosis not present

## 2023-02-04 DIAGNOSIS — K219 Gastro-esophageal reflux disease without esophagitis: Secondary | ICD-10-CM | POA: Diagnosis not present

## 2023-02-04 DIAGNOSIS — M797 Fibromyalgia: Secondary | ICD-10-CM | POA: Diagnosis not present

## 2023-02-04 DIAGNOSIS — M353 Polymyalgia rheumatica: Secondary | ICD-10-CM | POA: Diagnosis not present

## 2023-02-04 DIAGNOSIS — M810 Age-related osteoporosis without current pathological fracture: Secondary | ICD-10-CM | POA: Diagnosis not present

## 2023-02-16 DIAGNOSIS — E781 Pure hyperglyceridemia: Secondary | ICD-10-CM | POA: Diagnosis not present

## 2023-02-16 DIAGNOSIS — E559 Vitamin D deficiency, unspecified: Secondary | ICD-10-CM | POA: Diagnosis not present

## 2023-02-16 DIAGNOSIS — M0579 Rheumatoid arthritis with rheumatoid factor of multiple sites without organ or systems involvement: Secondary | ICD-10-CM | POA: Diagnosis not present

## 2023-02-16 DIAGNOSIS — E1159 Type 2 diabetes mellitus with other circulatory complications: Secondary | ICD-10-CM | POA: Diagnosis not present

## 2023-02-16 DIAGNOSIS — Z79899 Other long term (current) drug therapy: Secondary | ICD-10-CM | POA: Diagnosis not present

## 2023-02-17 ENCOUNTER — Ambulatory Visit (HOSPITAL_COMMUNITY): Payer: PPO

## 2023-02-22 DIAGNOSIS — M810 Age-related osteoporosis without current pathological fracture: Secondary | ICD-10-CM | POA: Diagnosis not present

## 2023-02-23 DIAGNOSIS — K219 Gastro-esophageal reflux disease without esophagitis: Secondary | ICD-10-CM | POA: Diagnosis not present

## 2023-02-23 DIAGNOSIS — M0579 Rheumatoid arthritis with rheumatoid factor of multiple sites without organ or systems involvement: Secondary | ICD-10-CM | POA: Diagnosis not present

## 2023-02-23 DIAGNOSIS — E559 Vitamin D deficiency, unspecified: Secondary | ICD-10-CM | POA: Diagnosis not present

## 2023-02-23 DIAGNOSIS — M810 Age-related osteoporosis without current pathological fracture: Secondary | ICD-10-CM | POA: Diagnosis not present

## 2023-02-23 DIAGNOSIS — Z79899 Other long term (current) drug therapy: Secondary | ICD-10-CM | POA: Diagnosis not present

## 2023-02-23 DIAGNOSIS — E1159 Type 2 diabetes mellitus with other circulatory complications: Secondary | ICD-10-CM | POA: Diagnosis not present

## 2023-02-23 DIAGNOSIS — E781 Pure hyperglyceridemia: Secondary | ICD-10-CM | POA: Diagnosis not present

## 2023-02-23 DIAGNOSIS — M353 Polymyalgia rheumatica: Secondary | ICD-10-CM | POA: Diagnosis not present

## 2023-02-24 ENCOUNTER — Encounter (HOSPITAL_BASED_OUTPATIENT_CLINIC_OR_DEPARTMENT_OTHER): Payer: Self-pay | Admitting: Orthopaedic Surgery

## 2023-02-25 ENCOUNTER — Ambulatory Visit (HOSPITAL_BASED_OUTPATIENT_CLINIC_OR_DEPARTMENT_OTHER): Payer: Self-pay | Admitting: Orthopaedic Surgery

## 2023-02-25 NOTE — H&P (Signed)
Chief Complaint: Right shoulder pain        History of Present Illness:      Misty Morris is a 72 y.o. female right-hand-dominant female presents with ongoing right shoulder pain over the course of the last several years.  She has previously undergone physical therapy particularly aquatic therapy at [facility] with little relief of the right shoulder.  She states that she does have good motion although overhead activity does cause pain.  This is limiting her and being active.  She is a retired Engineer, civil (consulting).  She has not previously had any injections.  She has trialed anti-inflammatories without relief       Surgical History:   None   PMH/PSH/Family History/Social History/Meds/Allergies:         Past Medical History:  Diagnosis Date   Cancer (HCC)      hx of thyroid cancer   Fibromyalgia     Hypercholesterolemia     Hypertension     Polymyalgia rheumatica (HCC)     Rheumatoid arthritis (HCC)      "atypical/MD 08/2017" (02/14/2018)   Shingles     Stroke (HCC) 08/2017    "no residual" (02/14/2018)   TIA (transient ischemic attack) 02/14/2018   Type 2 diabetes, diet controlled (HCC)     Vision impairment      right eye             Past Surgical History:  Procedure Laterality Date   CATARACT EXTRACTION W/ INTRAOCULAR LENS  IMPLANT, BILATERAL       COLONOSCOPY WITH PROPOFOL N/A 10/12/2019    Procedure: COLONOSCOPY WITH PROPOFOL;  Surgeon: Misty Hawking, MD;  Location: [facility];  Service: Endoscopy;  Laterality: N/A;   HEMOSTASIS CLIP PLACEMENT   10/12/2019    Procedure: HEMOSTASIS CLIP PLACEMENT;  Surgeon: Misty Hawking, MD;  Location: [facility];  Service: Endoscopy;;   JOINT REPLACEMENT       OPEN REDUCTION LE FORT I FRACTURE   1980s   PARTIAL KNEE ARTHROPLASTY Left 01/17/2017    Procedure: UNICOMPARTMENTAL LEFT KNEE;  Surgeon: Misty Romans, MD;  Location: [facility];  Service: Orthopedics;  Laterality: Left;   POLYPECTOMY   10/12/2019    Procedure: POLYPECTOMY;   Surgeon: Misty Hawking, MD;  Location: [facility];  Service: Endoscopy;;   RADIOLOGY WITH ANESTHESIA N/A 08/11/2017    Procedure: MRI OF BRAIN WITH AND WITHOUT CONSTRAST, AND OF THE ORBIT WITH AND WITHOUT CONTRAST;  Surgeon: Radiologist, Medication, MD;  Location: [facility];  Service: Radiology;  Laterality: N/A;   RADIOLOGY WITH ANESTHESIA N/A 09/06/2017    Procedure: MRI OF BRAIN;  Surgeon: Radiologist, Medication, MD;  Location: [facility];  Service: Radiology;  Laterality: N/A;   RHINOPLASTY       TOTAL KNEE ARTHROPLASTY Right 09/30/2015    Procedure: TOTAL RIGHT KNEE ARTHROPLASTY;  Surgeon: Misty Romans, MD;  Location: [facility];  Service: Orthopedics;  Laterality: Right;   VAGINAL HYSTERECTOMY            Social History         Socioeconomic History   Marital status: Single      Spouse name: Not on file   Number of children: Not on file   Years of education: Not on file   Highest education level: Not on file  Occupational History   Not on file  Tobacco Use   Smoking status: Former      Packs/day: 1.00  Years: 25.00      Additional pack years: 0.00      Total pack years: 25.00      Types: Cigarettes      Quit date: 12/27/2011      Years since quitting: 10.9   Smokeless tobacco: Never  Vaping Use   Vaping Use: Never used  Substance and Sexual Activity   Alcohol use: Never   Drug use: Yes      Types: Marijuana      Comment: on occas   Sexual activity: Not Currently  Other Topics Concern   Not on file  Social History Narrative   Not on file    Social Determinants of Health    Financial Resource Strain: Not on file  Food Insecurity: Not on file  Transportation Needs: Not on file  Physical Activity: Not on file  Stress: Not on file  Social Connections: Not on file         Family History  Problem Relation Age of Onset   Hypertension Mother     Seizures Mother     Hypertension Father     Hypertension Sister     Pancreatic cancer Other          Allergies        Allergies  Allergen Reactions   Codeine Hives   Demerol [Meperidine] Hives   Latex Dermatitis and Other (See Comments)      Blistering     Morphine And Related Hives   Sulfa Antibiotics Hives   Tramadol Hives   Vicodin [Hydrocodone-Acetaminophen] Itching      Elevated blood pressure. Tolerates low doses.            Current Outpatient Medications  Medication Sig Dispense Refill   Acetylcarn-Alpha Lipoic Acid 400-200 MG CAPS Take 1 capsule by mouth 2 (two) times daily.       alendronate (FOSAMAX) 70 MG tablet Take 70 mg by mouth every Friday. Take with a full glass of water on an empty stomach.       amLODipine (NORVASC) 5 MG tablet Take 5 mg by mouth every evening.    2   Calcium-Magnesium-Vitamin D (CALCIUM 1200+D3 PO) Take 1 tablet by mouth at bedtime.       cholecalciferol (VITAMIN D3) 25 MCG (1000 UNIT) tablet Take 2,000 Units by mouth daily.       Chromium Picolinate 200 MCG CAPS Take 200 mcg by mouth daily.       clopidogrel (PLAVIX) 75 MG tablet Take 1 tablet (75 mg total) by mouth daily. 30 tablet 3   Coenzyme Q10 (COQ-10) 100 MG CAPS Take 100 mg by mouth daily.       diphenhydrAMINE (BENADRYL) 25 MG tablet Take 25 mg by mouth at bedtime.       levothyroxine (SYNTHROID) 125 MCG tablet Take 125 mcg by mouth daily before breakfast.       LYSINE PO Take 650 mg by mouth daily.       moxifloxacin (VIGAMOX) 0.5 % ophthalmic solution Place 1 drop into the left eye daily.       Polyvinyl Alcohol-Povidone (REFRESH OP) Place 1 drop into the left eye in the morning, at noon, and at bedtime.       predniSONE (DELTASONE) 1 MG tablet Take 1 mg by mouth every evening. Take with 5 mg to equal 6 mg once daily .  Takes 4       predniSONE (DELTASONE) 5 MG tablet Take 5 mg by mouth every evening.  Take with 1 mg to equal 6 mg once daily (Patient not taking: Reported on 10/12/2022)       rosuvastatin (CRESTOR) 20 MG tablet Take 1 tablet (20 mg total) by mouth daily at 6 PM. 30 tablet 3   timolol  (TIMOPTIC) 0.25 % ophthalmic solution Place 1 drop into both eyes daily.       ZIOPTAN 0.0015 % SOLN Place 1 drop into both eyes at bedtime.   3      No current facility-administered medications for this visit.      Imaging Results (Last 48 hours)  No results found.     Review of Systems:   A ROS was performed including pertinent positives and negatives as documented in the HPI.   Physical Exam :   Constitutional: NAD and appears stated age Neurological: Alert and oriented Psych: Appropriate affect and cooperative There were no vitals taken for this visit.    Comprehensive Musculoskeletal Exam:     Musculoskeletal Exam      Inspection Right Left  Skin No atrophy or winging No atrophy or winging  Palpation      Tenderness Lateral deltoid None  Range of Motion      Flexion (passive) 170 170  Flexion (active) 170 170  Abduction 170 170  ER at the side 70 70  Can reach behind back to T12 T12  Strength        Weakness with forward elevation, negative belly press None  Special Tests      Pseudoparalytic No No  Neurologic      Fires PIN, radial, median, ulnar, musculocutaneous, axillary, suprascapular, long thoracic, and spinal accessory innervated muscles. No abnormal sensibility  Vascular/Lymphatic      Radial Pulse 2+ 2+  Cervical Exam      Patient has symmetric cervical range of motion with negative Spurling's test.  Special Test: Positive Neer impingement    No MGR CTAB Abdomen soft     Imaging:   Xray (3 views right shoulder): Sclerosis of the greater tuberosity with very mild degenerative findings about the glenohumeral joint       I personally reviewed and interpreted the radiographs.     Assessment:   72 y.o. female right-hand-dominant female with right shoulder pain consistent with possible rotator cuff tear.  I do believe that given the fact that she has failed physical therapy at this time an MRI would be useful to help further counsel her to decide on  the next steps.  She does have very severe claustrophobia and to that effect we will need to order a sedated MRI.  Will plan to proceed with this and I will see her back following discuss results   Plan :     -Plan for sedated MRI and follow-up to discuss results         I personally saw and evaluated the patient, and participated in the management and treatment plan.   Huel Cote, MD Attending Physician, Orthopedic Surgery   This document was dictated using Dragon voice recognition software. A reasonable attempt at proof reading has been made to minimize errors.

## 2023-03-02 ENCOUNTER — Other Ambulatory Visit: Payer: Self-pay

## 2023-03-02 ENCOUNTER — Encounter (HOSPITAL_COMMUNITY): Payer: Self-pay

## 2023-03-02 NOTE — Progress Notes (Signed)
Anesthesia Chart Review: Same day workup  72 yo female with pertinent hx of HTN, TIA (2019), polymyalgia rheumatica, fibromyalgia, diet controlled DM2.  Seen by PCP Dr. Irena Reichmann 02/23/23 and discussed likely needing shoulder surgery in the near future. Chronic conditions well controlled, A1c 6.3. At that time she was medically cleared to undergo MRI with anesthesia.   CBC 02/28/23 from PCP office reviewed, WNL, WBC 7.4, Hgb 13.1, Plt 232.  CMP 02/28/23 from PCP office reviewed, WNL, Cr 0.85, Na 144, K 4.0.  Pt will need DOS eval.  TTE 02/15/2018: - Left ventricle: The cavity size was normal. Wall thickness was    increased in a pattern of mild LVH. There was moderate focal    basal hypertrophy of the septum. Systolic function was normal.    The estimated ejection fraction was in the range of 55% to 60%.    Wall motion was normal; there were no regional wall motion    abnormalities. Doppler parameters are consistent with abnormal    left ventricular relaxation (grade 1 diastolic dysfunction).  - Mitral valve: Calcified annulus.  - Atrial septum: No defect or patent foramen ovale was identified    by color doppler.   Impressions:   - Normal LV systolic function with mild to moderate LVH and    diastolic dysfunction.     Zannie Cove [facility] Short Stay Center/Anesthesiology Phone [phone] 03/02/2023 9:10 AM

## 2023-03-02 NOTE — Anesthesia Preprocedure Evaluation (Addendum)
Anesthesia Evaluation  Patient identified by MRN, date of birth, ID band Patient awake    Reviewed: Allergy & Precautions, NPO status , Patient's Chart, lab work & pertinent test results  History of Anesthesia Complications Negative for: history of anesthetic complications  Airway Mallampati: II  TM Distance: >3 FB Neck ROM: Full    Dental  (+) Dental Advisory Given, Missing   Pulmonary neg shortness of breath, neg sleep apnea, neg COPD, neg recent URI, Patient abstained from smoking., former smoker   breath sounds clear to auscultation       Cardiovascular hypertension,  Rhythm:Regular     Neuro/Psych TIA Neuromuscular disease CVA    GI/Hepatic negative GI ROS, Neg liver ROS,,,  Endo/Other  diabetes    Renal/GU negative Renal ROS     Musculoskeletal  (+) Arthritis ,  Fibromyalgia -  Abdominal   Peds  Hematology   Anesthesia Other Findings   Reproductive/Obstetrics                             Anesthesia Physical Anesthesia Plan  ASA: 3  Anesthesia Plan: General   Post-op Pain Management: Minimal or no pain anticipated   Induction: Intravenous  PONV Risk Score and Plan: 3 and Ondansetron and Treatment may vary due to age or medical condition  Airway Management Planned: LMA and Oral ETT  Additional Equipment: None  Intra-op Plan:   Post-operative Plan: Extubation in OR  Informed Consent: I have reviewed the patients History and Physical, chart, labs and discussed the procedure including the risks, benefits and alternatives for the proposed anesthesia with the patient or authorized representative who has indicated his/her understanding and acceptance.     Dental advisory given  Plan Discussed with: CRNA  Anesthesia Plan Comments: (PAT note by Misty Poles, PA-C: 72 yo female with pertinent hx of HTN, TIA (2019), polymyalgia rheumatica, fibromyalgia, diet controlled  DM2.  Seen by PCP Dr. Irena Reichmann 02/23/23 and discussed likely needing shoulder surgery in the near future. Chronic conditions well controlled, A1c 6.3. At that time she was medically cleared to undergo MRI with anesthesia.   CBC 02/28/23 from PCP office reviewed, WNL, WBC 7.4, Hgb 13.1, Plt 232.  CMP 02/28/23 from PCP office reviewed, WNL, Cr 0.85, Na 144, K 4.0.  Pt will need DOS eval.  TTE 02/15/2018: - Left ventricle: The cavity size was normal. Wall thickness was  increased in a pattern of mild LVH. There was moderate focal  basal hypertrophy of the septum. Systolic function was normal.  The estimated ejection fraction was in the range of 55% to 60%.  Wall motion was normal; there were no regional wall motion  abnormalities. Doppler parameters are consistent with abnormal  left ventricular relaxation (grade 1 diastolic dysfunction).  - Mitral valve: Calcified annulus.  - Atrial septum: No defect or patent foramen ovale was identified  by color doppler.   Impressions:   - Normal LV systolic function with mild to moderate LVH and  diastolic dysfunction.   )        Anesthesia Quick Evaluation

## 2023-03-02 NOTE — Progress Notes (Signed)
SDW call  Patient was given pre-op instructions over the phone. Patient verbalized understanding of instructions provided.     PCP - Dr. Irena Reichmann Cardiologist - denies Pulmonary: denies   PPM/ICD - denies Device Orders - n/a Rep Notified - n/a   Chest x-ray - 10/07/2018 EKG -  DOS, 7/25/20204 Stress Test - ECHO - 02/15/2018 Cardiac Cath -   Sleep Study/sleep apnea/CPAP: denies  Type II diabetic, diet controlled Fasting Blood sugar range: 85-90 How often check sugars: daily   Blood Thinner Instructions: Plavix, states last dose 03/02/2023 Aspirin Instructions:denies   ERAS Protcol - Yes, clear fluids until 0700   COVID TEST- n/a    Anesthesia review: Yes. HTN, stroke, DM, TIA   Patient denies shortness of breath, fever, cough and chest pain over the phone call  Your procedure is scheduled on Thursday 03/03/2023  Report to [facility] Main Entrance "A" at 0730  A.M., then check in with the Admitting office.  Call this number if you have problems the morning of surgery:  [phone]   If you have any questions prior to your surgery date call [phone]: Open Monday-Friday 8am-4pm If you experience any cold or flu symptoms such as cough, fever, chills, shortness of breath, etc. between now and your scheduled surgery, please notify us at the above number    Remember:  Do not eat after midnight the night before your surgery  You may drink clear liquids until 0700  the morning of your surgery.   Clear liquids allowed are: Water, Non-Citrus Juices (without pulp), Carbonated Beverages, Clear Tea, Black Coffee ONLY (NO MILK, CREAM OR POWDERED CREAMER of any kind), and Gatorade   Take these medicines the morning of surgery with A SIP OF WATER:  Levothyroxine, moxifloxacin eye drops, refresh eye drops, timoptic eye drops   As of today, STOP taking any Aspirin (unless otherwise instructed by your surgeon) Aleve, Naproxen, Ibuprofen, Motrin, Advil, Goody's, BC's, all herbal  medications, fish oil, and all vitamins.

## 2023-03-03 ENCOUNTER — Encounter (HOSPITAL_COMMUNITY)
Admission: RE | Disposition: A | Discharge status: Home / Self Care | Payer: Self-pay | Source: Home / Self Care | Attending: Orthopaedic Surgery

## 2023-03-03 ENCOUNTER — Ambulatory Visit (HOSPITAL_COMMUNITY)
Admission: RE | Admit: 2023-03-03 | Discharge: 2023-03-03 | Disposition: A | Discharge status: Home / Self Care | Payer: PPO | Attending: Orthopaedic Surgery | Admitting: Orthopaedic Surgery

## 2023-03-03 ENCOUNTER — Ambulatory Visit (HOSPITAL_COMMUNITY)
Admission: RE | Admit: 2023-03-03 | Discharge: 2023-03-03 | Disposition: A | Discharge status: Home / Self Care | Payer: PPO | Source: Ambulatory Visit | Attending: Orthopaedic Surgery | Admitting: Orthopaedic Surgery

## 2023-03-03 ENCOUNTER — Ambulatory Visit (HOSPITAL_COMMUNITY): Payer: PPO | Admitting: Physician Assistant

## 2023-03-03 ENCOUNTER — Ambulatory Visit (HOSPITAL_BASED_OUTPATIENT_CLINIC_OR_DEPARTMENT_OTHER): Payer: PPO | Admitting: Physician Assistant

## 2023-03-03 ENCOUNTER — Encounter (HOSPITAL_COMMUNITY): Payer: Self-pay

## 2023-03-03 ENCOUNTER — Other Ambulatory Visit: Payer: Self-pay

## 2023-03-03 DIAGNOSIS — M19011 Primary osteoarthritis, right shoulder: Secondary | ICD-10-CM | POA: Diagnosis not present

## 2023-03-03 DIAGNOSIS — M7581 Other shoulder lesions, right shoulder: Secondary | ICD-10-CM | POA: Diagnosis not present

## 2023-03-03 DIAGNOSIS — Z87891 Personal history of nicotine dependence: Secondary | ICD-10-CM

## 2023-03-03 DIAGNOSIS — Z8673 Personal history of transient ischemic attack (TIA), and cerebral infarction without residual deficits: Secondary | ICD-10-CM | POA: Diagnosis not present

## 2023-03-03 DIAGNOSIS — M25511 Pain in right shoulder: Secondary | ICD-10-CM | POA: Diagnosis present

## 2023-03-03 DIAGNOSIS — G8929 Other chronic pain: Secondary | ICD-10-CM

## 2023-03-03 DIAGNOSIS — M069 Rheumatoid arthritis, unspecified: Secondary | ICD-10-CM | POA: Diagnosis not present

## 2023-03-03 DIAGNOSIS — I10 Essential (primary) hypertension: Secondary | ICD-10-CM | POA: Diagnosis not present

## 2023-03-03 DIAGNOSIS — M67813 Other specified disorders of tendon, right shoulder: Secondary | ICD-10-CM | POA: Diagnosis not present

## 2023-03-03 DIAGNOSIS — M12811 Other specific arthropathies, not elsewhere classified, right shoulder: Secondary | ICD-10-CM | POA: Diagnosis not present

## 2023-03-03 DIAGNOSIS — I679 Cerebrovascular disease, unspecified: Secondary | ICD-10-CM | POA: Diagnosis not present

## 2023-03-03 DIAGNOSIS — M25411 Effusion, right shoulder: Secondary | ICD-10-CM | POA: Diagnosis not present

## 2023-03-03 DIAGNOSIS — M75111 Incomplete rotator cuff tear or rupture of right shoulder, not specified as traumatic: Secondary | ICD-10-CM | POA: Diagnosis not present

## 2023-03-03 DIAGNOSIS — S43431A Superior glenoid labrum lesion of right shoulder, initial encounter: Secondary | ICD-10-CM | POA: Diagnosis not present

## 2023-03-03 HISTORY — PX: RADIOLOGY WITH ANESTHESIA: SHX6223

## 2023-03-03 LAB — GLUCOSE, CAPILLARY
Glucose-Capillary: 109 mg/dL — ABNORMAL HIGH (ref 70–99)
Glucose-Capillary: 86 mg/dL (ref 70–99)

## 2023-03-03 SURGERY — MRI WITH ANESTHESIA
Anesthesia: General | Laterality: Right

## 2023-03-03 MED ORDER — LIDOCAINE 2% (20 MG/ML) 5 ML SYRINGE
INTRAMUSCULAR | Status: DC | PRN
  Administered 2023-03-03: 60 mg via INTRAVENOUS

## 2023-03-03 MED ORDER — ROCURONIUM BROMIDE 100 MG/10ML IV SOLN
INTRAVENOUS | Status: DC | PRN
  Administered 2023-03-03: 30 mg via INTRAVENOUS

## 2023-03-03 MED ORDER — ORAL CARE MOUTH RINSE: 15.0000 mL | Freq: Once | OROMUCOSAL | Status: AC

## 2023-03-03 MED ORDER — LACTATED RINGERS IV SOLN: INTRAVENOUS | Status: DC

## 2023-03-03 MED ORDER — CHLORHEXIDINE GLUCONATE 0.12 % MT SOLN: 15.0000 mL | Freq: Once | OROMUCOSAL | Status: AC

## 2023-03-03 MED ORDER — PROPOFOL 10 MG/ML IV BOLUS
INTRAVENOUS | Status: DC | PRN
Start: 2023-03-03 — End: 2023-03-03
  Administered 2023-03-03: 160 mg via INTRAVENOUS
  Administered 2023-03-03: 40 mg via INTRAVENOUS

## 2023-03-03 MED ORDER — CHLORHEXIDINE GLUCONATE 0.12 % MT SOLN
OROMUCOSAL | Status: AC
  Administered 2023-03-03: 15 mL via OROMUCOSAL
  Filled 2023-03-03: qty 15

## 2023-03-03 MED ORDER — SUCCINYLCHOLINE 20MG/ML (10ML) SYRINGE FOR MEDFUSION PUMP - OPTIME
INTRAMUSCULAR | Status: DC | PRN
  Administered 2023-03-03: 100 mg via INTRAVENOUS

## 2023-03-03 MED ORDER — SUGAMMADEX SODIUM 200 MG/2ML IV SOLN
INTRAVENOUS | Status: DC | PRN
  Administered 2023-03-03: 200 mg via INTRAVENOUS

## 2023-03-03 MED ORDER — ONDANSETRON HCL 4 MG/2ML IJ SOLN
INTRAMUSCULAR | Status: DC | PRN
  Administered 2023-03-03: 4 mg via INTRAVENOUS

## 2023-03-03 NOTE — H&P (Signed)
Chief Complaint: Right shoulder pain        History of Present Illness:      Misty Morris is a 72 y.o. female right-hand-dominant female presents with ongoing right shoulder pain over the course of the last several years.  She has previously undergone physical therapy particularly aquatic therapy at [facility] with little relief of the right shoulder.  She states that she does have good motion although overhead activity does cause pain.  This is limiting her and being active.  She is a retired Engineer, civil (consulting).  She has not previously had any injections.  She has trialed anti-inflammatories without relief       Surgical History:   None   PMH/PSH/Family History/Social History/Meds/Allergies:         Past Medical History:  Diagnosis Date   Cancer (HCC)      hx of thyroid cancer   Fibromyalgia     Hypercholesterolemia     Hypertension     Polymyalgia rheumatica (HCC)     Rheumatoid arthritis (HCC)      "atypical/MD 08/2017" (02/14/2018)   Shingles     Stroke (HCC) 08/2017    "no residual" (02/14/2018)   TIA (transient ischemic attack) 02/14/2018   Type 2 diabetes, diet controlled (HCC)     Vision impairment      right eye             Past Surgical History:  Procedure Laterality Date   CATARACT EXTRACTION W/ INTRAOCULAR LENS  IMPLANT, BILATERAL       COLONOSCOPY WITH PROPOFOL N/A 10/12/2019    Procedure: COLONOSCOPY WITH PROPOFOL;  Surgeon: Jeani Hawking, MD;  Location: [facility];  Service: Endoscopy;  Laterality: N/A;   HEMOSTASIS CLIP PLACEMENT   10/12/2019    Procedure: HEMOSTASIS CLIP PLACEMENT;  Surgeon: Jeani Hawking, MD;  Location: [facility];  Service: Endoscopy;;   JOINT REPLACEMENT       OPEN REDUCTION LE FORT I FRACTURE   1980s   PARTIAL KNEE ARTHROPLASTY Left 01/17/2017    Procedure: UNICOMPARTMENTAL LEFT KNEE;  Surgeon: Durene Romans, MD;  Location: [facility];  Service: Orthopedics;  Laterality: Left;   POLYPECTOMY   10/12/2019    Procedure: POLYPECTOMY;   Surgeon: Jeani Hawking, MD;  Location: [facility];  Service: Endoscopy;;   RADIOLOGY WITH ANESTHESIA N/A 08/11/2017    Procedure: MRI OF BRAIN WITH AND WITHOUT CONSTRAST, AND OF THE ORBIT WITH AND WITHOUT CONTRAST;  Surgeon: Radiologist, Medication, MD;  Location: [facility] OR;  Service: Radiology;  Laterality: N/A;   RADIOLOGY WITH ANESTHESIA N/A 09/06/2017    Procedure: MRI OF BRAIN;  Surgeon: Radiologist, Medication, MD;  Location: [facility] OR;  Service: Radiology;  Laterality: N/A;   RHINOPLASTY       TOTAL KNEE ARTHROPLASTY Right 09/30/2015    Procedure: TOTAL RIGHT KNEE ARTHROPLASTY;  Surgeon: Durene Romans, MD;  Location: [facility];  Service: Orthopedics;  Laterality: Right;   VAGINAL HYSTERECTOMY            Social History         Socioeconomic History   Marital status: Single      Spouse name: Not on file   Number of children: Not on file   Years of education: Not on file   Highest education level: Not on file  Occupational History   Not on file  Tobacco Use   Smoking status: Former      Packs/day: 1.00  Years: 25.00      Additional pack years: 0.00      Total pack years: 25.00      Types: Cigarettes      Quit date: 12/27/2011      Years since quitting: 10.9   Smokeless tobacco: Never  Vaping Use   Vaping Use: Never used  Substance and Sexual Activity   Alcohol use: Never   Drug use: Yes      Types: Marijuana      Comment: on occas   Sexual activity: Not Currently  Other Topics Concern   Not on file  Social History Narrative   Not on file    Social Determinants of Health    Financial Resource Strain: Not on file  Food Insecurity: Not on file  Transportation Needs: Not on file  Physical Activity: Not on file  Stress: Not on file  Social Connections: Not on file         Family History  Problem Relation Age of Onset   Hypertension Mother     Seizures Mother     Hypertension Father     Hypertension Sister     Pancreatic cancer Other          Allergies        Allergies  Allergen Reactions   Codeine Hives   Demerol [Meperidine] Hives   Latex Dermatitis and Other (See Comments)      Blistering     Morphine And Related Hives   Sulfa Antibiotics Hives   Tramadol Hives   Vicodin [Hydrocodone-Acetaminophen] Itching      Elevated blood pressure. Tolerates low doses.            Current Outpatient Medications  Medication Sig Dispense Refill   Acetylcarn-Alpha Lipoic Acid 400-200 MG CAPS Take 1 capsule by mouth 2 (two) times daily.       alendronate (FOSAMAX) 70 MG tablet Take 70 mg by mouth every Friday. Take with a full glass of water on an empty stomach.       amLODipine (NORVASC) 5 MG tablet Take 5 mg by mouth every evening.    2   Calcium-Magnesium-Vitamin D (CALCIUM 1200+D3 PO) Take 1 tablet by mouth at bedtime.       cholecalciferol (VITAMIN D3) 25 MCG (1000 UNIT) tablet Take 2,000 Units by mouth daily.       Chromium Picolinate 200 MCG CAPS Take 200 mcg by mouth daily.       clopidogrel (PLAVIX) 75 MG tablet Take 1 tablet (75 mg total) by mouth daily. 30 tablet 3   Coenzyme Q10 (COQ-10) 100 MG CAPS Take 100 mg by mouth daily.       diphenhydrAMINE (BENADRYL) 25 MG tablet Take 25 mg by mouth at bedtime.       levothyroxine (SYNTHROID) 125 MCG tablet Take 125 mcg by mouth daily before breakfast.       LYSINE PO Take 650 mg by mouth daily.       moxifloxacin (VIGAMOX) 0.5 % ophthalmic solution Place 1 drop into the left eye daily.       Polyvinyl Alcohol-Povidone (REFRESH OP) Place 1 drop into the left eye in the morning, at noon, and at bedtime.       predniSONE (DELTASONE) 1 MG tablet Take 1 mg by mouth every evening. Take with 5 mg to equal 6 mg once daily .  Takes 4       predniSONE (DELTASONE) 5 MG tablet Take 5 mg by mouth every evening.  Take with 1 mg to equal 6 mg once daily (Patient not taking: Reported on 10/12/2022)       rosuvastatin (CRESTOR) 20 MG tablet Take 1 tablet (20 mg total) by mouth daily at 6 PM. 30 tablet 3   timolol  (TIMOPTIC) 0.25 % ophthalmic solution Place 1 drop into both eyes daily.       ZIOPTAN 0.0015 % SOLN Place 1 drop into both eyes at bedtime.   3      No current facility-administered medications for this visit.      Imaging Results (Last 48 hours)  No results found.     Review of Systems:   A ROS was performed including pertinent positives and negatives as documented in the HPI.   Physical Exam :   Constitutional: NAD and appears stated age Neurological: Alert and oriented Psych: Appropriate affect and cooperative There were no vitals taken for this visit.    Comprehensive Musculoskeletal Exam:     Musculoskeletal Exam      Inspection Right Left  Skin No atrophy or winging No atrophy or winging  Palpation      Tenderness Lateral deltoid None  Range of Motion      Flexion (passive) 170 170  Flexion (active) 170 170  Abduction 170 170  ER at the side 70 70  Can reach behind back to T12 T12  Strength        Weakness with forward elevation, negative belly press None  Special Tests      Pseudoparalytic No No  Neurologic      Fires PIN, radial, median, ulnar, musculocutaneous, axillary, suprascapular, long thoracic, and spinal accessory innervated muscles. No abnormal sensibility  Vascular/Lymphatic      Radial Pulse 2+ 2+  Cervical Exam      Patient has symmetric cervical range of motion with negative Spurling's test.  Special Test: Positive Neer impingement    No MGR CTAB Abdomen soft     Imaging:   Xray (3 views right shoulder): Sclerosis of the greater tuberosity with very mild degenerative findings about the glenohumeral joint       I personally reviewed and interpreted the radiographs.     Assessment:   72 y.o. female right-hand-dominant female with right shoulder pain consistent with possible rotator cuff tear.  I do believe that given the fact that she has failed physical therapy at this time an MRI would be useful to help further counsel her to decide on  the next steps.  She does have very severe claustrophobia and to that effect we will need to order a sedated MRI.  Will plan to proceed with this and I will see her back following discuss results   Plan :     -Plan for sedated MRI and follow-up to discuss results         I personally saw and evaluated the patient, and participated in the management and treatment plan.   Huel Cote, MD Attending Physician, Orthopedic Surgery   This document was dictated using Dragon voice recognition software. A reasonable attempt at proof reading has been made to minimize errors.

## 2023-03-03 NOTE — Anesthesia Procedure Notes (Signed)
Procedure Name: Intubation Date/Time: 03/03/2023 10:32 AM  Performed by: Alease Medina, CRNAPre-anesthesia Checklist: Patient identified, Emergency Drugs available, Suction available and Patient being monitored Patient Re-evaluated:Patient Re-evaluated prior to induction Oxygen Delivery Method: Circle system utilized Preoxygenation: Pre-oxygenation with 100% oxygen Induction Type: IV induction Ventilation: Mask ventilation without difficulty Laryngoscope Size: Mac and 3 Grade View: Grade I Tube type: Oral Tube size: 7.5 mm Number of attempts: 1 Airway Equipment and Method: Stylet and Oral airway Placement Confirmation: ETT inserted through vocal cords under direct vision, positive ETCO2 and breath sounds checked- equal and bilateral Secured at: 21 cm Tube secured with: Tape Dental Injury: Teeth and Oropharynx as per pre-operative assessment

## 2023-03-03 NOTE — Transfer of Care (Signed)
Immediate Anesthesia Transfer of Care Note  Patient: Misty Morris  Procedure(s) Performed: MRI SHOULDER WITHOUT CONTRAST WITH ANESTHESIA (Right)  Patient Location: PACU  Anesthesia Type:General  Level of Consciousness: drowsy and patient cooperative  Airway & Oxygen Therapy: Patient Spontanous Breathing  Post-op Assessment: Report given to RN, Post -op Vital signs reviewed and stable, and Patient moving all extremities X 4  Post vital signs: Reviewed and stable  Last Vitals:  Vitals Value Taken Time  BP 134/63 03/03/23 1106  Temp 36.4 C 03/03/23 1105  Pulse 67 03/03/23 1107  Resp 11 03/03/23 1107  SpO2 93 % 03/03/23 1107  Vitals shown include unfiled device data.  Last Pain:  Vitals:   03/03/23 1105  TempSrc:   PainSc: 0-No pain         Complications: No notable events documented.

## 2023-03-04 ENCOUNTER — Encounter (HOSPITAL_COMMUNITY): Payer: Self-pay | Admitting: Radiology

## 2023-03-04 NOTE — Anesthesia Postprocedure Evaluation (Signed)
Anesthesia Post Note  Patient: Misty Morris  Procedure(s) Performed: MRI SHOULDER WITHOUT CONTRAST WITH ANESTHESIA (Right)     Patient location during evaluation: PACU Anesthesia Type: General Level of consciousness: awake and alert Pain management: pain level controlled Vital Signs Assessment: post-procedure vital signs reviewed and stable Respiratory status: spontaneous breathing, nonlabored ventilation and respiratory function stable Cardiovascular status: blood pressure returned to baseline and stable Postop Assessment: no apparent nausea or vomiting Anesthetic complications: no   No notable events documented.  Last Vitals:  Vitals:   03/03/23 1120 03/03/23 1135  BP: 136/75 129/73  Pulse: 60 64  Resp: 12 14  Temp:  (!) 36.4 C  SpO2: 99% 99%    Last Pain:  Vitals:   03/03/23 1135  TempSrc:   PainSc: 0-No pain                 Johnica Armwood

## 2023-03-07 DIAGNOSIS — Z961 Presence of intraocular lens: Secondary | ICD-10-CM | POA: Diagnosis not present

## 2023-03-07 DIAGNOSIS — H401132 Primary open-angle glaucoma, bilateral, moderate stage: Secondary | ICD-10-CM | POA: Diagnosis not present

## 2023-03-09 ENCOUNTER — Other Ambulatory Visit (HOSPITAL_BASED_OUTPATIENT_CLINIC_OR_DEPARTMENT_OTHER): Payer: Self-pay

## 2023-03-09 ENCOUNTER — Ambulatory Visit (INDEPENDENT_AMBULATORY_CARE_PROVIDER_SITE_OTHER): Payer: PPO | Admitting: Orthopaedic Surgery

## 2023-03-09 ENCOUNTER — Ambulatory Visit (HOSPITAL_BASED_OUTPATIENT_CLINIC_OR_DEPARTMENT_OTHER): Payer: Self-pay | Admitting: Orthopaedic Surgery

## 2023-03-09 DIAGNOSIS — M7581 Other shoulder lesions, right shoulder: Secondary | ICD-10-CM

## 2023-03-09 DIAGNOSIS — S46011A Strain of muscle(s) and tendon(s) of the rotator cuff of right shoulder, initial encounter: Secondary | ICD-10-CM | POA: Diagnosis not present

## 2023-03-09 MED ORDER — IBUPROFEN 800 MG PO TABS
800.0000 mg | ORAL_TABLET | Freq: Three times a day (TID) | ORAL | 0 refills | Status: DC
  Filled 2023-03-09: qty 30, 10d supply, fill #0

## 2023-03-09 MED ORDER — IBUPROFEN 800 MG PO TABS: 800.0000 mg | ORAL_TABLET | Freq: Three times a day (TID) | ORAL | 0 refills | Status: AC

## 2023-03-09 MED ORDER — HYDROMORPHONE HCL 2 MG PO TABS: 1.0000 mg | ORAL_TABLET | ORAL | 0 refills | Status: DC | PRN

## 2023-03-09 MED ORDER — HYDROMORPHONE HCL 2 MG PO TABS
1.0000 mg | ORAL_TABLET | ORAL | 0 refills | Status: DC | PRN
Start: 2023-03-09 — End: 2023-03-09
  Filled 2023-03-09: qty 5, 2d supply, fill #0

## 2023-03-09 NOTE — Progress Notes (Signed)
Chief Complaint: Right shoulder pain        History of Present Illness:      03/09/2023: Today for follow-up of her right shoulder MRI.  She is continue to have persistent overhead pain and weakness.  Misty Morris is a 72 y.o. female right-hand-dominant female presents with ongoing right shoulder pain over the course of the last several years.  She has previously undergone physical therapy particularly aquatic therapy at [facility] with little relief of the right shoulder.  She states that she does have good motion although overhead activity does cause pain.  This is limiting her and being active.  She is a retired Engineer, civil (consulting).  She has not previously had any injections.  She has trialed anti-inflammatories without relief       Surgical History:   None   PMH/PSH/Family History/Social History/Meds/Allergies:         Past Medical History:  Diagnosis Date   Cancer (HCC)      hx of thyroid cancer   Fibromyalgia     Hypercholesterolemia     Hypertension     Polymyalgia rheumatica (HCC)     Rheumatoid arthritis (HCC)      "atypical/MD 08/2017" (02/14/2018)   Shingles     Stroke (HCC) 08/2017    "no residual" (02/14/2018)   TIA (transient ischemic attack) 02/14/2018   Type 2 diabetes, diet controlled (HCC)     Vision impairment      right eye             Past Surgical History:  Procedure Laterality Date   CATARACT EXTRACTION W/ INTRAOCULAR LENS  IMPLANT, BILATERAL       COLONOSCOPY WITH PROPOFOL N/A 10/12/2019    Procedure: COLONOSCOPY WITH PROPOFOL;  Surgeon: Jeani Hawking, MD;  Location: [facility];  Service: Endoscopy;  Laterality: N/A;   HEMOSTASIS CLIP PLACEMENT   10/12/2019    Procedure: HEMOSTASIS CLIP PLACEMENT;  Surgeon: Jeani Hawking, MD;  Location: [facility];  Service: Endoscopy;;   JOINT REPLACEMENT       OPEN REDUCTION LE FORT I FRACTURE   1980s   PARTIAL KNEE ARTHROPLASTY Left 01/17/2017    Procedure: UNICOMPARTMENTAL LEFT KNEE;  Surgeon: Durene Romans, MD;  Location: [facility];  Service: Orthopedics;  Laterality: Left;   POLYPECTOMY   10/12/2019    Procedure: POLYPECTOMY;  Surgeon: Jeani Hawking, MD;  Location: [facility];  Service: Endoscopy;;   RADIOLOGY WITH ANESTHESIA N/A 08/11/2017    Procedure: MRI OF BRAIN WITH AND WITHOUT CONSTRAST, AND OF THE ORBIT WITH AND WITHOUT CONTRAST;  Surgeon: Radiologist, Medication, MD;  Location: [facility];  Service: Radiology;  Laterality: N/A;   RADIOLOGY WITH ANESTHESIA N/A 09/06/2017    Procedure: MRI OF BRAIN;  Surgeon: Radiologist, Medication, MD;  Location: [facility];  Service: Radiology;  Laterality: N/A;   RHINOPLASTY       TOTAL KNEE ARTHROPLASTY Right 09/30/2015    Procedure: TOTAL RIGHT KNEE ARTHROPLASTY;  Surgeon: Durene Romans, MD;  Location: [facility];  Service: Orthopedics;  Laterality: Right;   VAGINAL HYSTERECTOMY            Social History         Socioeconomic History   Marital status: Single      Spouse name: Not on file   Number of children: Not on file   Years of education: Not on file   Highest education level: Not on file  Occupational  History   Not on file  Tobacco Use   Smoking status: Former      Packs/day: 1.00      Years: 25.00      Additional pack years: 0.00      Total pack years: 25.00      Types: Cigarettes      Quit date: 12/27/2011      Years since quitting: 10.9   Smokeless tobacco: Never  Vaping Use   Vaping Use: Never used  Substance and Sexual Activity   Alcohol use: Never   Drug use: Yes      Types: Marijuana      Comment: on occas   Sexual activity: Not Currently  Other Topics Concern   Not on file  Social History Narrative   Not on file    Social Determinants of Health    Financial Resource Strain: Not on file  Food Insecurity: Not on file  Transportation Needs: Not on file  Physical Activity: Not on file  Stress: Not on file  Social Connections: Not on file         Family History  Problem Relation Age of Onset   Hypertension Mother      Seizures Mother     Hypertension Father     Hypertension Sister     Pancreatic cancer Other          Allergies       Allergies  Allergen Reactions   Codeine Hives   Demerol [Meperidine] Hives   Latex Dermatitis and Other (See Comments)      Blistering     Morphine And Related Hives   Sulfa Antibiotics Hives   Tramadol Hives   Vicodin [Hydrocodone-Acetaminophen] Itching      Elevated blood pressure. Tolerates low doses.            Current Outpatient Medications  Medication Sig Dispense Refill   Acetylcarn-Alpha Lipoic Acid 400-200 MG CAPS Take 1 capsule by mouth 2 (two) times daily.       alendronate (FOSAMAX) 70 MG tablet Take 70 mg by mouth every Friday. Take with a full glass of water on an empty stomach.       amLODipine (NORVASC) 5 MG tablet Take 5 mg by mouth every evening.    2   Calcium-Magnesium-Vitamin D (CALCIUM 1200+D3 PO) Take 1 tablet by mouth at bedtime.       cholecalciferol (VITAMIN D3) 25 MCG (1000 UNIT) tablet Take 2,000 Units by mouth daily.       Chromium Picolinate 200 MCG CAPS Take 200 mcg by mouth daily.       clopidogrel (PLAVIX) 75 MG tablet Take 1 tablet (75 mg total) by mouth daily. 30 tablet 3   Coenzyme Q10 (COQ-10) 100 MG CAPS Take 100 mg by mouth daily.       diphenhydrAMINE (BENADRYL) 25 MG tablet Take 25 mg by mouth at bedtime.       levothyroxine (SYNTHROID) 125 MCG tablet Take 125 mcg by mouth daily before breakfast.       LYSINE PO Take 650 mg by mouth daily.       moxifloxacin (VIGAMOX) 0.5 % ophthalmic solution Place 1 drop into the left eye daily.       Polyvinyl Alcohol-Povidone (REFRESH OP) Place 1 drop into the left eye in the morning, at noon, and at bedtime.       predniSONE (DELTASONE) 1 MG tablet Take 1 mg by mouth every evening. Take with 5 mg to equal  6 mg once daily .  Takes 4       predniSONE (DELTASONE) 5 MG tablet Take 5 mg by mouth every evening. Take with 1 mg to equal 6 mg once daily (Patient not taking: Reported on  10/12/2022)       rosuvastatin (CRESTOR) 20 MG tablet Take 1 tablet (20 mg total) by mouth daily at 6 PM. 30 tablet 3   timolol (TIMOPTIC) 0.25 % ophthalmic solution Place 1 drop into both eyes daily.       ZIOPTAN 0.0015 % SOLN Place 1 drop into both eyes at bedtime.   3      No current facility-administered medications for this visit.      Imaging Results (Last 48 hours)  No results found.     Review of Systems:   A ROS was performed including pertinent positives and negatives as documented in the HPI.   Physical Exam :   Constitutional: NAD and appears stated age Neurological: Alert and oriented Psych: Appropriate affect and cooperative There were no vitals taken for this visit.    Comprehensive Musculoskeletal Exam:     Musculoskeletal Exam      Inspection Right Left  Skin No atrophy or winging No atrophy or winging  Palpation      Tenderness Lateral deltoid None  Range of Motion      Flexion (passive) 170 170  Flexion (active) 170 170  Abduction 170 170  ER at the side 70 70  Can reach behind back to T12 T12  Strength        Weakness with forward elevation, negative belly press None  Special Tests      Pseudoparalytic No No  Neurologic      Fires PIN, radial, median, ulnar, musculocutaneous, axillary, suprascapular, long thoracic, and spinal accessory innervated muscles. No abnormal sensibility  Vascular/Lymphatic      Radial Pulse 2+ 2+  Cervical Exam      Patient has symmetric cervical range of motion with negative Spurling's test.  Special Test: Positive Neer impingement    No MGR CTAB Abdomen soft     Imaging:   Xray (3 views right shoulder): Sclerosis of the greater tuberosity with very mild degenerative findings about the glenohumeral joint   MRI right shoulder: Full-thickness tear of the supraspinatus tendon with retraction to the humeral head.  No significant muscle belly atrophy on T1 sagittal view     I personally reviewed and interpreted the  radiographs.     Assessment:   72 y.o. female right-hand-dominant female with right shoulder pain in the setting of the rotator cuff tear.  At this time she is not getting any relief from physical therapy to that effect we did discuss additional interventions such as arthroscopy with rotator cuff repair.  I did specifically also discussed that given the tear size, we may ultimately require collagen patch augmentation to ensure the best long-term outcome.  I did discuss continued conservative treatments including physical therapy although she has tried this versus an injection.  After discussion she has elected for right shoulder arthroscopy with rotator cuff repair Plan :     -Plan for right shoulder arthroscopy with rotator cuff repair, acromioplasty, possible collagen patch augmentation   After a lengthy discussion of treatment options, including risks, benefits, alternatives, complications of surgical and nonsurgical conservative options, the patient elected surgical repair.   The patient  is aware of the material risks  and complications including, but not limited to injury to adjacent structures,  neurovascular injury, infection, numbness, bleeding, implant failure, thermal burns, stiffness, persistent pain, failure to heal, disease transmission from allograft, need for further surgery, dislocation, anesthetic risks, blood clots, risks of death,and others. The probabilities of surgical success and failure discussed with patient given their particular co-morbidities.The time and nature of expected rehabilitation and recovery was discussed.The patient's questions were all answered preoperatively.  No barriers to understanding were noted. I explained the natural history of the disease process and Rx rationale.  I explained to the patient what I considered to be reasonable expectations given their personal situation.  The final treatment plan was arrived at through a shared patient decision making  process model.          I personally saw and evaluated the patient, and participated in the management and treatment plan.   Huel Cote, MD Attending Physician, Orthopedic Surgery   This document was dictated using Dragon voice recognition software. A reasonable attempt at proof reading has been made to minimize errors.

## 2023-03-09 NOTE — Addendum Note (Signed)
Addended by: Benancio Deeds on: 03/09/2023 12:40 PM   Modules accepted: Orders

## 2023-03-18 ENCOUNTER — Other Ambulatory Visit (HOSPITAL_BASED_OUTPATIENT_CLINIC_OR_DEPARTMENT_OTHER): Payer: Self-pay | Admitting: Orthopaedic Surgery

## 2023-03-18 DIAGNOSIS — S46011A Strain of muscle(s) and tendon(s) of the rotator cuff of right shoulder, initial encounter: Secondary | ICD-10-CM

## 2023-04-15 NOTE — Progress Notes (Signed)
Surgical Instructions   Your procedure is scheduled on April 25, 2023. Report to [facility] Main Entrance "A" at 10:15 A.M., then check in with the Admitting office. Any questions or running late day of surgery: call [phone]  Questions prior to your surgery date: call [phone], Monday-Friday, 8am-4pm. If you experience any cold or flu symptoms such as cough, fever, chills, shortness of breath, etc. between now and your scheduled surgery, please notify us at the above number.     Remember:  Do not eat after midnight the night before your surgery   You may drink clear liquids until 9:15 the morning of your surgery.   Clear liquids allowed are: Water, Non-Citrus Juices (without pulp), Carbonated Beverages, Clear Tea, Black Coffee Only (NO MILK, CREAM OR POWDERED CREAMER of any kind), and Gatorade.     Take these medicines the morning of surgery with A SIP OF WATER  levothyroxine (SYNTHROID) timolol (TIMOPTIC)  moxifloxacin (VIGAMOX)   May take these medicines IF NEEDED: HYDROmorphone (DILAUDID)   Follow your surgeon's instructions on when to stop Clopidogrel (Plavix).  If no instructions were given by your surgeon then you will need to call the office to get those instructions.     One week prior to surgery, STOP taking any Aspirin (unless otherwise instructed by your surgeon) Aleve, Naproxen, Ibuprofen, Motrin, Advil, Goody's, BC's, all herbal medications, fish oil, and non-prescription vitamins.    HOW TO MANAGE YOUR DIABETES BEFORE AND AFTER SURGERY  Why is it important to control my blood sugar before and after surgery? Improving blood sugar levels before and after surgery helps healing and can limit problems. A way of improving blood sugar control is eating a healthy diet by:  Eating less sugar and carbohydrates  Increasing activity/exercise  Talking with your doctor about reaching your blood sugar goals High blood sugars (greater than 180 mg/dL) can raise  your risk of infections and slow your recovery, so you will need to focus on controlling your diabetes during the weeks before surgery. Make sure that the doctor who takes care of your diabetes knows about your planned surgery including the date and location.  How do I manage my blood sugar before surgery? Check your blood sugar at least 4 times a day, starting 2 days before surgery, to make sure that the level is not too high or low.  Check your blood sugar the morning of your surgery when you wake up and every 2 hours until you get to the Short Stay unit.  If your blood sugar is less than 70 mg/dL, you will need to treat for low blood sugar: Do not take insulin. Treat a low blood sugar (less than 70 mg/dL) with  cup of clear juice (cranberry or apple), 4 glucose tablets, OR glucose gel. Recheck blood sugar in 15 minutes after treatment (to make sure it is greater than 70 mg/dL). If your blood sugar is not greater than 70 mg/dL on recheck, call [phone] for further instructions. Report your blood sugar to the short stay nurse when you get to Short Stay.  If you are admitted to the hospital after surgery: Your blood sugar will be checked by the staff and you will probably be given insulin after surgery (instead of oral diabetes medicines) to make sure you have good blood sugar levels. The goal for blood sugar control after surgery is 80-180 mg/dL.  Do NOT Smoke (Tobacco/Vaping) for 24 hours prior to your procedure.  If you use a CPAP at night, you may bring your mask/headgear for your overnight stay.   You will be asked to remove any contacts, glasses, piercing's, hearing aid's, dentures/partials prior to surgery. Please bring cases for these items if needed.    Patients discharged the day of surgery will not be allowed to drive home, and someone needs to stay with them for 24 hours.  SURGICAL WAITING ROOM VISITATION Patients may have no more than 2 support  people in the waiting area - these visitors may rotate.   Pre-op nurse will coordinate an appropriate time for 1 ADULT support person, who may not rotate, to accompany patient in pre-op.  Children under the age of 36 must have an adult with them who is not the patient and must remain in the main waiting area with an adult.  If the patient needs to stay at the hospital during part of their recovery, the visitor guidelines for inpatient rooms apply.  Please refer to the [facility] website for the visitor guidelines for any additional information.   If you received a COVID test during your pre-op visit  it is requested that you wear a mask when out in public, stay away from anyone that may not be feeling well and notify your surgeon if you develop symptoms. If you have been in contact with anyone that has tested positive in the last 10 days please notify you surgeon.      Pre-operative CHG Bathing Instructions   You can play a key role in reducing the risk of infection after surgery. Your skin needs to be as free of germs as possible. You can reduce the number of germs on your skin by washing with CHG (chlorhexidine gluconate) soap before surgery. CHG is an antiseptic soap that kills germs and continues to kill germs even after washing.   DO NOT use if you have an allergy to chlorhexidine/CHG or antibacterial soaps. If your skin becomes reddened or irritated, stop using the CHG and notify one of our RNs at [phone].              TAKE A SHOWER THE NIGHT BEFORE SURGERY AND THE DAY OF SURGERY    Please keep in mind the following:  DO NOT shave, including legs and underarms, 48 hours prior to surgery.   You may shave your face before/day of surgery.  Place clean sheets on your bed the night before surgery Use a clean washcloth (not used since being washed) for each shower. DO NOT sleep with pet's night before surgery.  CHG Shower Instructions:  If you choose to wash your hair and private  area, wash first with your normal shampoo/soap.  After you use shampoo/soap, rinse your hair and body thoroughly to remove shampoo/soap residue.  Turn the water OFF and apply half the bottle of CHG soap to a CLEAN washcloth.  Apply CHG soap ONLY FROM YOUR NECK DOWN TO YOUR TOES (washing for 3-5 minutes)  DO NOT use CHG soap on face, private areas, open wounds, or sores.  Pay special attention to the area where your surgery is being performed.  If you are having back surgery, having someone wash your back for you may be helpful. Wait 2 minutes after CHG soap is applied, then you may rinse off the CHG soap.  Pat dry with a clean towel  Put on clean pajamas    Additional instructions for the  day of surgery: DO NOT APPLY any lotions, deodorants, cologne, or perfumes.   Do not wear jewelry or makeup Do not wear nail polish, gel polish, artificial nails, or any other type of covering on natural nails (fingers and toes) Do not bring valuables to the hospital. [facility] is not responsible for valuables/personal belongings. Put on clean/comfortable clothes.  Please brush your teeth.  Ask your nurse before applying any prescription medications to the skin.

## 2023-04-15 NOTE — Progress Notes (Signed)
Surgical Instructions   Your procedure is scheduled on April 25, 2023. Report to [facility] Main Entrance "A" at 10:15 A.M., then check in with the Admitting office. Any questions or running late day of surgery: call [phone]  Questions prior to your surgery date: call [phone], Monday-Friday, 8am-4pm. If you experience any cold or flu symptoms such as cough, fever, chills, shortness of breath, etc. between now and your scheduled surgery, please notify us at the above number.     Remember:  Do not eat after midnight the night before your surgery   You may drink clear liquids until 9:15 the morning of your surgery.   Clear liquids allowed are: Water, Non-Citrus Juices (without pulp), Carbonated Beverages, Clear Tea, Black Coffee Only (NO MILK, CREAM OR POWDERED CREAMER of any kind), and Gatorade.    Take these medicines the morning of surgery with A SIP OF WATER   levothyroxine (SYNTHROID) Rosuvastatin (CRESTOR) timolol (TIMOPTIC)    May take these medicines IF NEEDED: HYDROmorphone (DILAUDID)   Follow your surgeon's instructions on when to stop Clopidogrel (Plavix).  If no instructions were given by your surgeon then you will need to call the office to get those instructions.     One week prior to surgery, STOP taking any Aspirin (unless otherwise instructed by your surgeon) Aleve, Naproxen, Ibuprofen, Motrin, Advil, Goody's, BC's, all herbal medications, fish oil, and non-prescription vitamins.   WHAT DO I DO ABOUT MY DIABETES MEDICATION?   Do not take oral diabetes medicines (pills) the morning of surgery.  THE NIGHT BEFORE SURGERY, take ___________ units of ___________insulin.       THE MORNING OF SURGERY, take _____________ units of __________insulin.  The day of surgery, do not take other diabetes injectables, including Byetta (exenatide), Bydureon (exenatide ER), Victoza (liraglutide), or Trulicity (dulaglutide).  If your CBG is greater than 220 mg/dL,  you may take  of your sliding scale (correction) dose of insulin.   HOW TO MANAGE YOUR DIABETES BEFORE AND AFTER SURGERY  Why is it important to control my blood sugar before and after surgery? Improving blood sugar levels before and after surgery helps healing and can limit problems. A way of improving blood sugar control is eating a healthy diet by:  Eating less sugar and carbohydrates  Increasing activity/exercise  Talking with your doctor about reaching your blood sugar goals High blood sugars (greater than 180 mg/dL) can raise your risk of infections and slow your recovery, so you will need to focus on controlling your diabetes during the weeks before surgery. Make sure that the doctor who takes care of your diabetes knows about your planned surgery including the date and location.  How do I manage my blood sugar before surgery? Check your blood sugar at least 4 times a day, starting 2 days before surgery, to make sure that the level is not too high or low.  Check your blood sugar the morning of your surgery when you wake up and every 2 hours until you get to the Short Stay unit.  If your blood sugar is less than 70 mg/dL, you will need to treat for low blood sugar: Do not take insulin. Treat a low blood sugar (less than 70 mg/dL) with  cup of clear juice (cranberry or apple), 4 glucose tablets, OR glucose gel. Recheck blood sugar in 15 minutes after treatment (to make sure it is greater than 70 mg/dL). If your blood sugar is not greater than 70 mg/dL on recheck, call [phone] for further  instructions. Report your blood sugar to the short stay nurse when you get to Short Stay.  If you are admitted to the hospital after surgery: Your blood sugar will be checked by the staff and you will probably be given insulin after surgery (instead of oral diabetes medicines) to make sure you have good blood sugar levels. The goal for blood sugar control after surgery is 80-180 mg/dL.                       Do NOT Smoke (Tobacco/Vaping) for 24 hours prior to your procedure.  If you use a CPAP at night, you may bring your mask/headgear for your overnight stay.   You will be asked to remove any contacts, glasses, piercing's, hearing aid's, dentures/partials prior to surgery. Please bring cases for these items if needed.    Patients discharged the day of surgery will not be allowed to drive home, and someone needs to stay with them for 24 hours.  SURGICAL WAITING ROOM VISITATION Patients may have no more than 2 support people in the waiting area - these visitors may rotate.   Pre-op nurse will coordinate an appropriate time for 1 ADULT support person, who may not rotate, to accompany patient in pre-op.  Children under the age of 103 must have an adult with them who is not the patient and must remain in the main waiting area with an adult.  If the patient needs to stay at the hospital during part of their recovery, the visitor guidelines for inpatient rooms apply.  Please refer to the [facility] website for the visitor guidelines for any additional information.   If you received a COVID test during your pre-op visit  it is requested that you wear a mask when out in public, stay away from anyone that may not be feeling well and notify your surgeon if you develop symptoms. If you have been in contact with anyone that has tested positive in the last 10 days please notify you surgeon.      Pre-operative CHG Bathing Instructions   You can play a key role in reducing the risk of infection after surgery. Your skin needs to be as free of germs as possible. You can reduce the number of germs on your skin by washing with CHG (chlorhexidine gluconate) soap before surgery. CHG is an antiseptic soap that kills germs and continues to kill germs even after washing.   DO NOT use if you have an allergy to chlorhexidine/CHG or antibacterial soaps. If your skin becomes reddened or irritated, stop  using the CHG and notify one of our RNs at [phone].              TAKE A SHOWER THE NIGHT BEFORE SURGERY AND THE DAY OF SURGERY    Please keep in mind the following:  DO NOT shave, including legs and underarms, 48 hours prior to surgery.   You may shave your face before/day of surgery.  Place clean sheets on your bed the night before surgery Use a clean washcloth (not used since being washed) for each shower. DO NOT sleep with pet's night before surgery.  CHG Shower Instructions:  If you choose to wash your hair and private area, wash first with your normal shampoo/soap.  After you use shampoo/soap, rinse your hair and body thoroughly to remove shampoo/soap residue.  Turn the water OFF and apply half the bottle of CHG soap to a CLEAN washcloth.  Apply CHG soap ONLY  FROM YOUR NECK DOWN TO YOUR TOES (washing for 3-5 minutes)  DO NOT use CHG soap on face, private areas, open wounds, or sores.  Pay special attention to the area where your surgery is being performed.  If you are having back surgery, having someone wash your back for you may be helpful. Wait 2 minutes after CHG soap is applied, then you may rinse off the CHG soap.  Pat dry with a clean towel  Put on clean pajamas    Additional instructions for the day of surgery: DO NOT APPLY any lotions, deodorants, cologne, or perfumes.   Do not wear jewelry or makeup Do not wear nail polish, gel polish, artificial nails, or any other type of covering on natural nails (fingers and toes) Do not bring valuables to the hospital. [facility] is not responsible for valuables/personal belongings. Put on clean/comfortable clothes.  Please brush your teeth.  Ask your nurse before applying any prescription medications to the skin.

## 2023-04-18 ENCOUNTER — Encounter (HOSPITAL_COMMUNITY): Payer: Self-pay

## 2023-04-18 ENCOUNTER — Other Ambulatory Visit: Payer: Self-pay

## 2023-04-18 ENCOUNTER — Encounter (HOSPITAL_COMMUNITY)
Admission: RE | Admit: 2023-04-18 | Discharge: 2023-04-18 | Disposition: A | Discharge status: Home / Self Care | Payer: PPO | Source: Ambulatory Visit | Attending: Orthopaedic Surgery | Admitting: Orthopaedic Surgery

## 2023-04-18 VITALS — BP 137/68 | HR 67 | Temp 98.0°F | Resp 17 | Ht 62.0 in | Wt 146.7 lb

## 2023-04-18 DIAGNOSIS — Z01812 Encounter for preprocedural laboratory examination: Secondary | ICD-10-CM | POA: Insufficient documentation

## 2023-04-18 DIAGNOSIS — E119 Type 2 diabetes mellitus without complications: Secondary | ICD-10-CM | POA: Insufficient documentation

## 2023-04-18 DIAGNOSIS — Z01818 Encounter for other preprocedural examination: Secondary | ICD-10-CM

## 2023-04-18 HISTORY — DX: Hypothyroidism, unspecified: E03.9

## 2023-04-18 LAB — CBC
HCT: 41.6 % (ref 36.0–46.0)
Hemoglobin: 13.2 g/dL (ref 12.0–15.0)
MCH: 27.2 pg (ref 26.0–34.0)
MCHC: 31.7 g/dL (ref 30.0–36.0)
MCV: 85.8 fL (ref 80.0–100.0)
Platelets: 298 10*3/uL (ref 150–400)
RBC: 4.85 MIL/uL (ref 3.87–5.11)
RDW: 14.9 % (ref 11.5–15.5)
WBC: 9.3 10*3/uL (ref 4.0–10.5)
nRBC: 0 % (ref 0.0–0.2)

## 2023-04-18 LAB — BASIC METABOLIC PANEL
Anion gap: 11 (ref 5–15)
BUN: 17 mg/dL (ref 8–23)
CO2: 29 mmol/L (ref 22–32)
Calcium: 9.3 mg/dL (ref 8.9–10.3)
Chloride: 103 mmol/L (ref 98–111)
Creatinine, Ser: 1.23 mg/dL — ABNORMAL HIGH (ref 0.44–1.00)
GFR, Estimated: 47 mL/min — ABNORMAL LOW (ref >60)
Glucose, Bld: 139 mg/dL — ABNORMAL HIGH (ref 70–99)
Potassium: 3.5 mmol/L (ref 3.5–5.1)
Sodium: 143 mmol/L (ref 135–145)

## 2023-04-18 LAB — GLUCOSE, CAPILLARY: Glucose-Capillary: 159 mg/dL — ABNORMAL HIGH (ref 70–99)

## 2023-04-18 LAB — HEMOGLOBIN A1C
Hgb A1c MFr Bld: 6.3 % — ABNORMAL HIGH (ref 4.8–5.6)
Mean Plasma Glucose: 134.11 mg/dL

## 2023-04-18 NOTE — Progress Notes (Signed)
PCP - Dr. Irena Reichmann Cardiologist - denies Rheumatologist- Dr. Deanne Coffer Endocrinologist- Dr. Kathreen Cosier  PPM/ICD - denies   Chest x-ray - 10/07/18 EKG - 03/03/23 Stress Test - denies ECHO - 02/15/18 Cardiac Cath - denies  Sleep Study - denies   Fasting Blood Sugar - 80-90 Checks Blood Sugar once a day  Last dose of GLP1 agonist-  n/a   Blood Thinner Instructions: Hold until after surgery, starting 9/9 Aspirin Instructions: n/a  ERAS Protcol - yes PRE-SURGERY G2- given at PAT  COVID TEST- n/a   Anesthesia review: yes, pt sees multiple providers. Hx of CVA  Patient denies shortness of breath, fever, cough and chest pain at PAT appointment   All instructions explained to the patient, with a verbal understanding of the material. Patient agrees to go over the instructions while at home for a better understanding.  The opportunity to ask questions was provided.

## 2023-04-25 ENCOUNTER — Encounter (HOSPITAL_COMMUNITY)
Admission: RE | Disposition: A | Discharge status: Home / Self Care | Payer: Self-pay | Source: Home / Self Care | Attending: Orthopaedic Surgery

## 2023-04-25 ENCOUNTER — Other Ambulatory Visit: Payer: Self-pay

## 2023-04-25 ENCOUNTER — Ambulatory Visit (HOSPITAL_COMMUNITY): Payer: PPO

## 2023-04-25 ENCOUNTER — Ambulatory Visit (HOSPITAL_COMMUNITY)
Admission: RE | Admit: 2023-04-25 | Discharge: 2023-04-25 | Disposition: A | Discharge status: Home / Self Care | Payer: PPO | Attending: Orthopaedic Surgery | Admitting: Orthopaedic Surgery

## 2023-04-25 ENCOUNTER — Encounter (HOSPITAL_COMMUNITY): Payer: Self-pay | Admitting: Orthopaedic Surgery

## 2023-04-25 ENCOUNTER — Ambulatory Visit (HOSPITAL_COMMUNITY): Payer: PPO | Admitting: Physician Assistant

## 2023-04-25 DIAGNOSIS — M7541 Impingement syndrome of right shoulder: Secondary | ICD-10-CM | POA: Diagnosis not present

## 2023-04-25 DIAGNOSIS — M797 Fibromyalgia: Secondary | ICD-10-CM | POA: Insufficient documentation

## 2023-04-25 DIAGNOSIS — Z8673 Personal history of transient ischemic attack (TIA), and cerebral infarction without residual deficits: Secondary | ICD-10-CM | POA: Insufficient documentation

## 2023-04-25 DIAGNOSIS — I10 Essential (primary) hypertension: Secondary | ICD-10-CM | POA: Diagnosis not present

## 2023-04-25 DIAGNOSIS — S46011A Strain of muscle(s) and tendon(s) of the rotator cuff of right shoulder, initial encounter: Secondary | ICD-10-CM

## 2023-04-25 DIAGNOSIS — M1990 Unspecified osteoarthritis, unspecified site: Secondary | ICD-10-CM | POA: Diagnosis not present

## 2023-04-25 DIAGNOSIS — G8918 Other acute postprocedural pain: Secondary | ICD-10-CM | POA: Diagnosis not present

## 2023-04-25 DIAGNOSIS — M25811 Other specified joint disorders, right shoulder: Secondary | ICD-10-CM | POA: Insufficient documentation

## 2023-04-25 DIAGNOSIS — M65811 Other synovitis and tenosynovitis, right shoulder: Secondary | ICD-10-CM | POA: Insufficient documentation

## 2023-04-25 DIAGNOSIS — Z87891 Personal history of nicotine dependence: Secondary | ICD-10-CM | POA: Insufficient documentation

## 2023-04-25 DIAGNOSIS — M75101 Unspecified rotator cuff tear or rupture of right shoulder, not specified as traumatic: Secondary | ICD-10-CM

## 2023-04-25 DIAGNOSIS — E039 Hypothyroidism, unspecified: Secondary | ICD-10-CM | POA: Insufficient documentation

## 2023-04-25 DIAGNOSIS — M75121 Complete rotator cuff tear or rupture of right shoulder, not specified as traumatic: Secondary | ICD-10-CM | POA: Insufficient documentation

## 2023-04-25 DIAGNOSIS — E119 Type 2 diabetes mellitus without complications: Secondary | ICD-10-CM | POA: Diagnosis not present

## 2023-04-25 HISTORY — PX: SHOULDER ARTHROSCOPY WITH ROTATOR CUFF REPAIR: SHX5685

## 2023-04-25 LAB — GLUCOSE, CAPILLARY
Glucose-Capillary: 99 mg/dL (ref 70–99)
Glucose-Capillary: 101 mg/dL — ABNORMAL HIGH (ref 70–99)

## 2023-04-25 SURGERY — ARTHROSCOPY, SHOULDER, WITH ROTATOR CUFF REPAIR
Anesthesia: General | Site: Shoulder | Laterality: Right

## 2023-04-25 MED ORDER — MIDAZOLAM HCL 2 MG/2ML IJ SOLN
INTRAMUSCULAR | Status: AC
  Administered 2023-04-25: 1 mg via INTRAVENOUS
  Filled 2023-04-25: qty 2

## 2023-04-25 MED ORDER — ONDANSETRON HCL 4 MG/2ML IJ SOLN
INTRAMUSCULAR | Status: AC
  Filled 2023-04-25: qty 2

## 2023-04-25 MED ORDER — FENTANYL CITRATE (PF) 250 MCG/5ML IJ SOLN
INTRAMUSCULAR | Status: AC
  Filled 2023-04-25: qty 5

## 2023-04-25 MED ORDER — TRANEXAMIC ACID-NACL 1000-0.7 MG/100ML-% IV SOLN
1000.0000 mg | INTRAVENOUS | Status: AC
  Administered 2023-04-25: 1000 mg via INTRAVENOUS
  Filled 2023-04-25: qty 100

## 2023-04-25 MED ORDER — LACTATED RINGERS IV SOLN: INTRAVENOUS | Status: DC

## 2023-04-25 MED ORDER — EPHEDRINE SULFATE-NACL 50-0.9 MG/10ML-% IV SOSY
PREFILLED_SYRINGE | INTRAVENOUS | Status: DC | PRN
  Administered 2023-04-25 (×2): 5 mg via INTRAVENOUS

## 2023-04-25 MED ORDER — EPINEPHRINE PF 1 MG/ML IJ SOLN
INTRAMUSCULAR | Status: AC
  Filled 2023-04-25: qty 2

## 2023-04-25 MED ORDER — OXYCODONE HCL 5 MG PO TABS: 5.0000 mg | ORAL_TABLET | ORAL | 0 refills | Status: DC | PRN

## 2023-04-25 MED ORDER — ACETAMINOPHEN 500 MG PO TABS
1000.0000 mg | ORAL_TABLET | Freq: Once | ORAL | Status: AC
  Administered 2023-04-25: 1000 mg via ORAL
  Filled 2023-04-25: qty 2

## 2023-04-25 MED ORDER — GABAPENTIN 300 MG PO CAPS
300.0000 mg | ORAL_CAPSULE | Freq: Once | ORAL | Status: DC
  Filled 2023-04-25: qty 1

## 2023-04-25 MED ORDER — MIDAZOLAM HCL 2 MG/2ML IJ SOLN: 1.0000 mg | Freq: Once | INTRAMUSCULAR | Status: AC

## 2023-04-25 MED ORDER — SUGAMMADEX SODIUM 200 MG/2ML IV SOLN
INTRAVENOUS | Status: DC | PRN
  Administered 2023-04-25: 200 mg via INTRAVENOUS
  Administered 2023-04-25: 100 mg via INTRAVENOUS

## 2023-04-25 MED ORDER — PROPOFOL 10 MG/ML IV BOLUS
INTRAVENOUS | Status: AC
  Filled 2023-04-25: qty 20

## 2023-04-25 MED ORDER — SODIUM CHLORIDE 0.9 % IR SOLN
Status: DC | PRN
  Administered 2023-04-25 (×2): 1 mL

## 2023-04-25 MED ORDER — ORAL CARE MOUTH RINSE: 15.0000 mL | Freq: Once | OROMUCOSAL | Status: AC

## 2023-04-25 MED ORDER — ROCURONIUM BROMIDE 10 MG/ML (PF) SYRINGE
PREFILLED_SYRINGE | INTRAVENOUS | Status: AC
  Filled 2023-04-25: qty 10

## 2023-04-25 MED ORDER — FENTANYL CITRATE (PF) 100 MCG/2ML IJ SOLN
INTRAMUSCULAR | Status: AC
  Administered 2023-04-25: 50 ug via INTRAVENOUS
  Filled 2023-04-25: qty 2

## 2023-04-25 MED ORDER — DEXAMETHASONE SODIUM PHOSPHATE 10 MG/ML IJ SOLN
INTRAMUSCULAR | Status: DC | PRN
  Administered 2023-04-25: 5 mg via INTRAVENOUS

## 2023-04-25 MED ORDER — PHENYLEPHRINE 80 MCG/ML (10ML) SYRINGE FOR IV PUSH (FOR BLOOD PRESSURE SUPPORT)
PREFILLED_SYRINGE | INTRAVENOUS | Status: AC
  Filled 2023-04-25: qty 10

## 2023-04-25 MED ORDER — BUPIVACAINE-EPINEPHRINE (PF) 0.5% -1:200000 IJ SOLN
INTRAMUSCULAR | Status: DC | PRN
  Administered 2023-04-25: 15 mL via PERINEURAL

## 2023-04-25 MED ORDER — CHLORHEXIDINE GLUCONATE 0.12 % MT SOLN
15.0000 mL | Freq: Once | OROMUCOSAL | Status: AC
  Administered 2023-04-25: 15 mL via OROMUCOSAL

## 2023-04-25 MED ORDER — BUPIVACAINE LIPOSOME 1.3 % IJ SUSP
INTRAMUSCULAR | Status: DC | PRN
Start: 2023-04-25 — End: 2023-04-25
  Administered 2023-04-25: 10 mL via PERINEURAL

## 2023-04-25 MED ORDER — CHLORHEXIDINE GLUCONATE 0.12 % MT SOLN
15.0000 mL | Freq: Once | OROMUCOSAL | Status: DC
  Filled 2023-04-25: qty 15

## 2023-04-25 MED ORDER — ONDANSETRON HCL 4 MG/2ML IJ SOLN
INTRAMUSCULAR | Status: DC | PRN
  Administered 2023-04-25: 4 mg via INTRAVENOUS

## 2023-04-25 MED ORDER — SODIUM CHLORIDE 0.9 % IR SOLN
Status: DC | PRN
Start: 2023-04-25 — End: 2023-04-25
  Administered 2023-04-25: 6000 mL

## 2023-04-25 MED ORDER — PHENYLEPHRINE HCL-NACL 20-0.9 MG/250ML-% IV SOLN
INTRAVENOUS | Status: DC | PRN
  Administered 2023-04-25: 40 ug/min via INTRAVENOUS

## 2023-04-25 MED ORDER — DEXAMETHASONE SODIUM PHOSPHATE 10 MG/ML IJ SOLN
INTRAMUSCULAR | Status: AC
  Filled 2023-04-25: qty 1

## 2023-04-25 MED ORDER — PROPOFOL 10 MG/ML IV BOLUS
INTRAVENOUS | Status: DC | PRN
  Administered 2023-04-25: 100 mg via INTRAVENOUS
  Administered 2023-04-25: 20 mg via INTRAVENOUS
  Administered 2023-04-25: 50 mg via INTRAVENOUS

## 2023-04-25 MED ORDER — LIDOCAINE 2% (20 MG/ML) 5 ML SYRINGE
INTRAMUSCULAR | Status: AC
  Filled 2023-04-25: qty 5

## 2023-04-25 MED ORDER — LIDOCAINE 2% (20 MG/ML) 5 ML SYRINGE
INTRAMUSCULAR | Status: DC | PRN
  Administered 2023-04-25: 40 mg via INTRAVENOUS

## 2023-04-25 MED ORDER — FENTANYL CITRATE (PF) 100 MCG/2ML IJ SOLN: 25.0000 ug | INTRAMUSCULAR | Status: DC | PRN

## 2023-04-25 MED ORDER — FENTANYL CITRATE (PF) 250 MCG/5ML IJ SOLN
INTRAMUSCULAR | Status: DC | PRN
  Administered 2023-04-25: 50 ug via INTRAVENOUS

## 2023-04-25 MED ORDER — ROCURONIUM BROMIDE 10 MG/ML (PF) SYRINGE
PREFILLED_SYRINGE | INTRAVENOUS | Status: DC | PRN
  Administered 2023-04-25: 50 mg via INTRAVENOUS

## 2023-04-25 MED ORDER — ORAL CARE MOUTH RINSE: 15.0000 mL | Freq: Once | OROMUCOSAL | Status: DC

## 2023-04-25 MED ORDER — ACETAMINOPHEN 500 MG PO TABS: 500.0000 mg | ORAL_TABLET | Freq: Three times a day (TID) | ORAL | 0 refills | Status: AC

## 2023-04-25 MED ORDER — CEFAZOLIN SODIUM-DEXTROSE 2-4 GM/100ML-% IV SOLN
2.0000 g | INTRAVENOUS | Status: AC
  Administered 2023-04-25: 2 g via INTRAVENOUS
  Filled 2023-04-25: qty 100

## 2023-04-25 MED ORDER — FENTANYL CITRATE (PF) 100 MCG/2ML IJ SOLN: 50.0000 ug | Freq: Once | INTRAMUSCULAR | Status: AC

## 2023-04-25 SURGICAL SUPPLY — 57 items
ANCH SUT KNTLS STRL SHLDR SYS (Anchor) ×1 IMPLANT
ANCHOR DUAL INSERTER (Anchor) IMPLANT
ANCHOR JUGGERKNOT 1.5 SOFT S-P (Anchor) IMPLANT
ANCHOR JUGGERKNOT SOFT S-P (Anchor) ×1 IMPLANT
ANCHOR SUT QUATTRO KNTLS 4.5 (Anchor) IMPLANT
BAG COUNTER SPONGE SURGICOUNT (BAG) ×2 IMPLANT
BAG SPNG CNTER NS LX DISP (BAG)
BLADE EXCALIBUR 4.0X13 (MISCELLANEOUS) ×2 IMPLANT
BLADE SHAVER TORPEDO 4X13 (MISCELLANEOUS) ×2 IMPLANT
CANNULA 5.75X71 LONG (CANNULA) IMPLANT
CANNULA TWIST IN 8.25X7CM (CANNULA) IMPLANT
COOLER ICEMAN CLASSIC (MISCELLANEOUS) IMPLANT
COVER SURGICAL LIGHT HANDLE (MISCELLANEOUS) ×2 IMPLANT
DISSECTOR 4.0MM X 13CM (MISCELLANEOUS) IMPLANT
DRAPE INCISE IOBAN 66X45 STRL (DRAPES) ×2 IMPLANT
DRAPE STERI 35X30 U-POUCH (DRAPES) ×4 IMPLANT
DRSG TEGADERM 2-3/8X2-3/4 SM (GAUZE/BANDAGES/DRESSINGS) ×2 IMPLANT
DRSG TEGADERM 4X4.75 (GAUZE/BANDAGES/DRESSINGS) IMPLANT
DRSG XEROFORM 1X8 (GAUZE/BANDAGES/DRESSINGS) IMPLANT
DW OUTFLOW CASSETTE/TUBE SET (MISCELLANEOUS) ×2 IMPLANT
GAUZE SPONGE 4X4 12PLY STRL (GAUZE/BANDAGES/DRESSINGS) ×2 IMPLANT
GAUZE XEROFORM 1X8 LF (GAUZE/BANDAGES/DRESSINGS) ×2 IMPLANT
GLOVE BIOGEL PI IND STRL 6.5 (GLOVE) ×2 IMPLANT
GLOVE BIOGEL PI IND STRL 8 (GLOVE) ×2 IMPLANT
GLOVE ECLIPSE 6.0 STRL STRAW (GLOVE) ×2 IMPLANT
GLOVE INDICATOR 8.0 STRL GRN (GLOVE) ×2 IMPLANT
GOWN STRL REUS W/ TWL LRG LVL3 (GOWN DISPOSABLE) ×4 IMPLANT
GOWN STRL REUS W/TWL LRG LVL3 (GOWN DISPOSABLE) ×2
IMPL TAPESTRY RC 26X26 LAT (Orthopedic Implant) IMPLANT
KIT BASIN OR (CUSTOM PROCEDURE TRAY) ×2 IMPLANT
KIT PERC INSERT 3.0 KNTLS (KITS) IMPLANT
KIT STR SPEAR 1.8 FBRTK DISP (KITS) ×2 IMPLANT
KIT TURNOVER KIT B (KITS) ×2 IMPLANT
LASSO CRESCENT QUICKPASS (SUTURE) IMPLANT
MANIFOLD NEPTUNE II (INSTRUMENTS) ×2 IMPLANT
NDL SPNL 18GX3.5 QUINCKE PK (NEEDLE) ×2 IMPLANT
NDL SUT 2-0 SCORPION KNEE (NEEDLE) ×2 IMPLANT
NDL SUT PASSER RTC (NEEDLE) IMPLANT
NEEDLE SPNL 18GX3.5 QUINCKE PK (NEEDLE) ×1
NEEDLE SUT 2-0 SCORPION KNEE (NEEDLE) ×1
NEEDLE SUT PASSER RTC (NEEDLE) ×1
NS IRRIG 1000ML POUR BTL (IV SOLUTION) ×2 IMPLANT
PACK SHOULDER (CUSTOM PROCEDURE TRAY) ×2 IMPLANT
PAD ABD 8X10 STRL (GAUZE/BANDAGES/DRESSINGS) IMPLANT
PAD ARMBOARD 7.5X6 YLW CONV (MISCELLANEOUS) ×4 IMPLANT
PAD COLD SHLDR WRAP-ON (PAD) IMPLANT
PORT APPOLLO RF 90DEGREE MULTI (SURGICAL WAND) IMPLANT
SPONGE T-LAP 4X18 ~~LOC~~+RFID (SPONGE) ×2 IMPLANT
SUT ETHILON 3 0 PS 1 (SUTURE) ×4 IMPLANT
SUT FIBERWIRE 2-0 18 17.9 3/8 (SUTURE)
SUT LASSO 90 DEG CVD (SUTURE) IMPLANT
SUTURE FIBERWR 2-0 18 17.9 3/8 (SUTURE) IMPLANT
SYR 30ML LL (SYRINGE) ×2 IMPLANT
TOWEL GREEN STERILE (TOWEL DISPOSABLE) ×2 IMPLANT
TOWEL GREEN STERILE FF (TOWEL DISPOSABLE) ×2 IMPLANT
TUBING ARTHROSCOPY IRRIG 16FT (MISCELLANEOUS) ×2 IMPLANT
WATER STERILE IRR 1000ML POUR (IV SOLUTION) ×2 IMPLANT

## 2023-04-25 NOTE — Anesthesia Preprocedure Evaluation (Addendum)
Anesthesia Evaluation  Patient identified by MRN, date of birth, ID band Patient awake    Reviewed: Allergy & Precautions, H&P , NPO status , Patient's Chart, lab work & pertinent test results  Airway Mallampati: II  TM Distance: >3 FB Neck ROM: Full    Dental no notable dental hx. (+) Upper Dentures, Partial Lower, Dental Advisory Given   Pulmonary Patient abstained from smoking., former smoker   Pulmonary exam normal breath sounds clear to auscultation       Cardiovascular hypertension, Pt. on medications  Rhythm:Regular Rate:Normal     Neuro/Psych TIACVA, No Residual Symptoms  negative psych ROS   GI/Hepatic negative GI ROS, Neg liver ROS,,,  Endo/Other  diabetes, Type 2Hypothyroidism    Renal/GU negative Renal ROS  negative genitourinary   Musculoskeletal  (+) Arthritis , Osteoarthritis,  Fibromyalgia -  Abdominal   Peds  Hematology negative hematology ROS (+)   Anesthesia Other Findings   Reproductive/Obstetrics negative OB ROS                             Anesthesia Physical Anesthesia Plan  ASA: 3  Anesthesia Plan: General   Post-op Pain Management: Regional block* and Tylenol PO (pre-op)*   Induction: Intravenous  PONV Risk Score and Plan: 4 or greater and Ondansetron, Dexamethasone and Midazolam  Airway Management Planned: Oral ETT  Additional Equipment:   Intra-op Plan:   Post-operative Plan: Extubation in OR  Informed Consent: I have reviewed the patients History and Physical, chart, labs and discussed the procedure including the risks, benefits and alternatives for the proposed anesthesia with the patient or authorized representative who has indicated his/her understanding and acceptance.     Dental advisory given  Plan Discussed with: CRNA  Anesthesia Plan Comments:        Anesthesia Quick Evaluation

## 2023-04-25 NOTE — Anesthesia Procedure Notes (Signed)
Anesthesia Regional Block: Interscalene brachial plexus block   Pre-Anesthetic Checklist: , timeout performed,  Correct Patient, Correct Site, Correct Laterality,  Correct Procedure, Correct Position, site marked,  Risks and benefits discussed,  Pre-op evaluation,  At surgeon's request and post-op pain management  Laterality: Right  Prep: Maximum Sterile Barrier Precautions used, chloraprep       Needles:  Injection technique: Single-shot  Needle Type: Echogenic Stimulator Needle     Needle Length: 5cm  Needle Gauge: 22     Additional Needles:   Procedures:,,,, ultrasound used (permanent image in chart),,    Narrative:  Start time: 04/25/2023 11:10 AM End time: 04/25/2023 11:20 AM Injection made incrementally with aspirations every 5 mL.  Performed by: Personally  Anesthesiologist: Gaynelle Adu, MD

## 2023-04-25 NOTE — Op Note (Signed)
Date of Surgery: 04/25/2023  INDICATIONS: Ms. Allensworth is a 72 y.o.-year-old female with right shoulder full thickness rotator cuff tear.  The risk and benefits of the procedure were discussed in detail and documented in the pre-operative evaluation.   PREOPERATIVE DIAGNOSES: Right shoulder, chronic rotator cuff tear and subacromial impingement.  POSTOPERATIVE DIAGNOSIS: Same.  PROCEDURE: Arthroscopic extensive debridement - 29823 Subdeltoid Bursa, Anterior Labrum, and Superior Labrum Arthroscopic subacromial decompression - [phone] Arthroscopic rotator cuff repair - 29827  SURGEON: Benancio Deeds MD  ASSISTANT: Kerby Less, ATC  ANESTHESIA:  general plus interscalene nerve block  IV FLUIDS AND URINE: See anesthesia record.  ANTIBIOTICS: Ancef  ESTIMATED BLOOD LOSS: 10 mL.  IMPLANTS:  Implant Name Type Inv. Item Serial No. Manufacturer Lot No. LRB No. Used Action  JuggerK[employer] Soft Anchor    ZIMMER [id number] Right 1 Implanted    DRAINS: None  CULTURES: None  COMPLICATIONS: none  PROCEDURE:    OPERATIVE FINDING: Exam under anesthesia:   Examination under anesthesia revealed forward elevation of 150 degrees.  With the arm at the side, there was 65 degrees of external rotation.  There is a 1+ anterior load shift and a 1+ posterior load shift.    Arthroscopic findings demonstrated: Articular space: Buford complex Chondral surfaces: Grade 2 chondral fissuring humerus/glenoid Biceps: Mild fraying, intact Subscapularis: Intact Supraspinatus: Full thickness tear Infraspinatus: Intact    I identified the patient in the pre-operative holding area.  I marked the operative right shoulder with my initials. I reviewed the risks and benefits of the proposed surgical intervention and the patient wished to proceed.  Anesthesia was then performed with regional block.  The patient was transferred to the operative suite and placed in the beach chair position with all bony prominences  padded.     SCDs were placed on bilateral lower extremity. Appropriate antibiotics was administered within 1 hour before incision.  Anesthesia was induced.  The operative extremity was then prepped and draped in standard fashion. A time out was performed confirming the correct extremity, correct patient and correct procedure.   The arthroscope was introduced in the glenohumeral joint from a posterior portal.  An anterior portal was created.  The shoulder was examined and the above findings were noted.     With an arthroscopic shaver and a wand ablator, synovitis throughout the  shoulder was resected.  The arthroscopic shaver was used to excise torn portions of the labrum back to a stable margin. Specifically this was done for the anterior superior and posterior labrum.     Through visualization from intra-articular, the footprint of the rotator cuff was debrided of soft tissues back to bleeding bone.  This was done with an arthroscopic shaver.     The rotator cuff was then approached through the subacromial space. Anterior, lateral, and posterior portals were used.  Bursectomy was performed with an arthroscopic shaver and [employer] wand.  The soft tissues on the undersurface of the acromion and clavicle were resected with the arthroscopic shaver and wand. There was good excursion that was noted of the tendon back to its footprint which would be amendable to an repair.   The rotator cuff was repaired with a double row configuration with one medial row all suture suture anchors as noted above with sutures passed from anterior to posterior in horizontal fashion with a self passing suture device.  A total of 4 limbs of suture were passed.  The sutures were placed with a single lateral row anchor.  This  provided excellent anatomic footprint approximation.   The shoulder was irrigated.  The arthroscopic instruments were removed.  Wounds were closed with 3-0 nylon sutures.  A sterile dressing was applied  with xeroform, 4x8s, abdominal pad, and tape. An Flonnie Hailstone was placed and the upper extremity was placed in a shoulder immobilizer.  The patient tolerated the procedure well and was taken to the recovery room in stable condition.  All counts were correct in the case. The patient tolerated the procedure well and was taken to the recovery room in stable condition.    POSTOPERATIVE PLAN: She will begin physical therapy post op according to the rotator cuff repair protocol. She will be placed on aspirin for blood clot prevention.   Benancio Deeds, MD 1:57 PM

## 2023-04-25 NOTE — Discharge Instructions (Signed)
Discharge Instructions    Attending Surgeon: Huel Cote, MD Office Phone Number: [phone]   Diagnosis and Procedures:    Surgeries Performed: Right shoulder rotator cuff repair  Discharge Plan:    Diet: Resume usual diet. Begin with light or bland foods.  Drink plenty of fluids.  Activity:  Keep sling and dressing in place until your follow up visit in Physical Therapy You are advised to go home directly from the hospital or surgical center. Restrict your activities.  GENERAL INSTRUCTIONS: 1.  Keep your surgical site elevated above your heart for at least 5-7 days or longer to prevent swelling. This will improve your comfort and your overall recovery following surgery.     2. Please call Dr. Serena Croissant office at [phone] with questions Monday-Friday during business hours. If no one answers, please leave a message and someone should get back to the patient within 24 hours. For emergencies please call 911 or proceed to the emergency room.   3. Patient to notify surgical team if experiences any of the following: Bowel/Bladder dysfunction, uncontrolled pain, nerve/muscle weakness, incision with increased drainage or redness, nausea/vomiting and Fever greater than 101.0 F.  Be alert for signs of infection including redness, streaking, odor, fever or chills. Be alert for excessive pain or bleeding and notify your surgeon immediately.  WOUND INSTRUCTIONS:   Leave your dressing/cast/splint in place until your post operative visit.  Keep it clean and dry.  Always keep the incision clean and dry until the staples/sutures are removed. If there is no drainage from the incision you should keep it open to air. If there is drainage from the incision you must keep it covered at all times until the drainage stops  Do not soak in a bath tub, hot tub, pool, lake or other body of water until 21 days after your surgery and your incision is completely dry and healed.  If you have  removable sutures (or staples) they must be removed 10-14 days (unless otherwise instructed) from the day of your surgery.     1)  Elevate the extremity as much as possible.  2)  Keep the dressing clean and dry.  3)  Please call us if the dressing becomes wet or dirty.  4)  If you are experiencing worsening pain or worsening swelling, please call.     MEDICATIONS: Resume all previous home medications at the previous prescribed dose and frequency unless otherwise noted Start taking the  pain medications on an as-needed basis as prescribed  Please taper down pain medication over the next week following surgery.  Ideally you should not require a refill of any narcotic pain medication.  Take pain medication with food to minimize nausea. In addition to the prescribed pain medication, you may take over-the-counter pain relievers such as Tylenol.  Do NOT take additional tylenol if your pain medication already has tylenol in it.  Aspirin 325mg  daily for four weeks.      FOLLOWUP INSTRUCTIONS: 1. Follow up at the Physical Therapy Clinic 3-4 days following surgery. This appointment should be scheduled unless other arrangements have been made.The Physical Therapy scheduling number is [phone] if an appointment has not already been arranged.  2. Contact Dr. Serena Croissant office during office hours at [phone] or the practice after hours line at [phone] for non-emergencies. For medical emergencies call 911.   Discharge Location: Home

## 2023-04-25 NOTE — H&P (Signed)
Chief Complaint: Right shoulder pain        History of Present Illness:      03/09/2023: Today for follow-up of her right shoulder MRI.  She is continue to have persistent overhead pain and weakness.  Misty Morris is a 72 y.o. female right-hand-dominant female presents with ongoing right shoulder pain over the course of the last several years.  She has previously undergone physical therapy particularly aquatic therapy at [facility] with little relief of the right shoulder.  She states that she does have good motion although overhead activity does cause pain.  This is limiting her and being active.  She is a retired Engineer, civil (consulting).  She has not previously had any injections.  She has trialed anti-inflammatories without relief       Surgical History:   None   PMH/PSH/Family History/Social History/Meds/Allergies:         Past Medical History:  Diagnosis Date   Cancer (HCC)      hx of thyroid cancer   Fibromyalgia     Hypercholesterolemia     Hypertension     Polymyalgia rheumatica (HCC)     Rheumatoid arthritis (HCC)      "atypical/MD 08/2017" (02/14/2018)   Shingles     Stroke (HCC) 08/2017    "no residual" (02/14/2018)   TIA (transient ischemic attack) 02/14/2018   Type 2 diabetes, diet controlled (HCC)     Vision impairment      right eye             Past Surgical History:  Procedure Laterality Date   CATARACT EXTRACTION W/ INTRAOCULAR LENS  IMPLANT, BILATERAL       COLONOSCOPY WITH PROPOFOL N/A 10/12/2019    Procedure: COLONOSCOPY WITH PROPOFOL;  Surgeon: Jeani Hawking, MD;  Location: [facility];  Service: Endoscopy;  Laterality: N/A;   HEMOSTASIS CLIP PLACEMENT   10/12/2019    Procedure: HEMOSTASIS CLIP PLACEMENT;  Surgeon: Jeani Hawking, MD;  Location: [facility];  Service: Endoscopy;;   JOINT REPLACEMENT       OPEN REDUCTION LE FORT I FRACTURE   1980s   PARTIAL KNEE ARTHROPLASTY Left 01/17/2017    Procedure: UNICOMPARTMENTAL LEFT KNEE;  Surgeon: Durene Romans, MD;  Location: [facility];  Service: Orthopedics;  Laterality: Left;   POLYPECTOMY   10/12/2019    Procedure: POLYPECTOMY;  Surgeon: Jeani Hawking, MD;  Location: [facility];  Service: Endoscopy;;   RADIOLOGY WITH ANESTHESIA N/A 08/11/2017    Procedure: MRI OF BRAIN WITH AND WITHOUT CONSTRAST, AND OF THE ORBIT WITH AND WITHOUT CONTRAST;  Surgeon: Radiologist, Medication, MD;  Location: [facility];  Service: Radiology;  Laterality: N/A;   RADIOLOGY WITH ANESTHESIA N/A 09/06/2017    Procedure: MRI OF BRAIN;  Surgeon: Radiologist, Medication, MD;  Location: [facility];  Service: Radiology;  Laterality: N/A;   RHINOPLASTY       TOTAL KNEE ARTHROPLASTY Right 09/30/2015    Procedure: TOTAL RIGHT KNEE ARTHROPLASTY;  Surgeon: Durene Romans, MD;  Location: [facility];  Service: Orthopedics;  Laterality: Right;   VAGINAL HYSTERECTOMY            Social History         Socioeconomic History   Marital status: Single      Spouse name: Not on file   Number of children: Not on file   Years of education: Not on file   Highest education level: Not on file  Occupational  History   Not on file  Tobacco Use   Smoking status: Former      Packs/day: 1.00      Years: 25.00      Additional pack years: 0.00      Total pack years: 25.00      Types: Cigarettes      Quit date: 12/27/2011      Years since quitting: 10.9   Smokeless tobacco: Never  Vaping Use   Vaping Use: Never used  Substance and Sexual Activity   Alcohol use: Never   Drug use: Yes      Types: Marijuana      Comment: on occas   Sexual activity: Not Currently  Other Topics Concern   Not on file  Social History Narrative   Not on file    Social Determinants of Health    Financial Resource Strain: Not on file  Food Insecurity: Not on file  Transportation Needs: Not on file  Physical Activity: Not on file  Stress: Not on file  Social Connections: Not on file         Family History  Problem Relation Age of Onset   Hypertension Mother      Seizures Mother     Hypertension Father     Hypertension Sister     Pancreatic cancer Other          Allergies       Allergies  Allergen Reactions   Codeine Hives   Demerol [Meperidine] Hives   Latex Dermatitis and Other (See Comments)      Blistering     Morphine And Related Hives   Sulfa Antibiotics Hives   Tramadol Hives   Vicodin [Hydrocodone-Acetaminophen] Itching      Elevated blood pressure. Tolerates low doses.            Current Outpatient Medications  Medication Sig Dispense Refill   Acetylcarn-Alpha Lipoic Acid 400-200 MG CAPS Take 1 capsule by mouth 2 (two) times daily.       alendronate (FOSAMAX) 70 MG tablet Take 70 mg by mouth every Friday. Take with a full glass of water on an empty stomach.       amLODipine (NORVASC) 5 MG tablet Take 5 mg by mouth every evening.    2   Calcium-Magnesium-Vitamin D (CALCIUM 1200+D3 PO) Take 1 tablet by mouth at bedtime.       cholecalciferol (VITAMIN D3) 25 MCG (1000 UNIT) tablet Take 2,000 Units by mouth daily.       Chromium Picolinate 200 MCG CAPS Take 200 mcg by mouth daily.       clopidogrel (PLAVIX) 75 MG tablet Take 1 tablet (75 mg total) by mouth daily. 30 tablet 3   Coenzyme Q10 (COQ-10) 100 MG CAPS Take 100 mg by mouth daily.       diphenhydrAMINE (BENADRYL) 25 MG tablet Take 25 mg by mouth at bedtime.       levothyroxine (SYNTHROID) 125 MCG tablet Take 125 mcg by mouth daily before breakfast.       LYSINE PO Take 650 mg by mouth daily.       moxifloxacin (VIGAMOX) 0.5 % ophthalmic solution Place 1 drop into the left eye daily.       Polyvinyl Alcohol-Povidone (REFRESH OP) Place 1 drop into the left eye in the morning, at noon, and at bedtime.       predniSONE (DELTASONE) 1 MG tablet Take 1 mg by mouth every evening. Take with 5 mg to equal  6 mg once daily .  Takes 4       predniSONE (DELTASONE) 5 MG tablet Take 5 mg by mouth every evening. Take with 1 mg to equal 6 mg once daily (Patient not taking: Reported on  10/12/2022)       rosuvastatin (CRESTOR) 20 MG tablet Take 1 tablet (20 mg total) by mouth daily at 6 PM. 30 tablet 3   timolol (TIMOPTIC) 0.25 % ophthalmic solution Place 1 drop into both eyes daily.       ZIOPTAN 0.0015 % SOLN Place 1 drop into both eyes at bedtime.   3      No current facility-administered medications for this visit.      Imaging Results (Last 48 hours)  No results found.     Review of Systems:   A ROS was performed including pertinent positives and negatives as documented in the HPI.   Physical Exam :   Constitutional: NAD and appears stated age Neurological: Alert and oriented Psych: Appropriate affect and cooperative There were no vitals taken for this visit.    Comprehensive Musculoskeletal Exam:     Musculoskeletal Exam      Inspection Right Left  Skin No atrophy or winging No atrophy or winging  Palpation      Tenderness Lateral deltoid None  Range of Motion      Flexion (passive) 170 170  Flexion (active) 170 170  Abduction 170 170  ER at the side 70 70  Can reach behind back to T12 T12  Strength        Weakness with forward elevation, negative belly press None  Special Tests      Pseudoparalytic No No  Neurologic      Fires PIN, radial, median, ulnar, musculocutaneous, axillary, suprascapular, long thoracic, and spinal accessory innervated muscles. No abnormal sensibility  Vascular/Lymphatic      Radial Pulse 2+ 2+  Cervical Exam      Patient has symmetric cervical range of motion with negative Spurling's test.  Special Test: Positive Neer impingement    No MGR CTAB Abdomen soft     Imaging:   Xray (3 views right shoulder): Sclerosis of the greater tuberosity with very mild degenerative findings about the glenohumeral joint   MRI right shoulder: Full-thickness tear of the supraspinatus tendon with retraction to the humeral head.  No significant muscle belly atrophy on T1 sagittal view     I personally reviewed and interpreted the  radiographs.     Assessment:   72 y.o. female right-hand-dominant female with right shoulder pain in the setting of the rotator cuff tear.  At this time she is not getting any relief from physical therapy to that effect we did discuss additional interventions such as arthroscopy with rotator cuff repair.  I did specifically also discussed that given the tear size, we may ultimately require collagen patch augmentation to ensure the best long-term outcome.  I did discuss continued conservative treatments including physical therapy although she has tried this versus an injection.  After discussion she has elected for right shoulder arthroscopy with rotator cuff repair Plan :     -Plan for right shoulder arthroscopy with rotator cuff repair, acromioplasty, possible collagen patch augmentation   After a lengthy discussion of treatment options, including risks, benefits, alternatives, complications of surgical and nonsurgical conservative options, the patient elected surgical repair.   The patient  is aware of the material risks  and complications including, but not limited to injury to adjacent structures,  neurovascular injury, infection, numbness, bleeding, implant failure, thermal burns, stiffness, persistent pain, failure to heal, disease transmission from allograft, need for further surgery, dislocation, anesthetic risks, blood clots, risks of death,and others. The probabilities of surgical success and failure discussed with patient given their particular co-morbidities.The time and nature of expected rehabilitation and recovery was discussed.The patient's questions were all answered preoperatively.  No barriers to understanding were noted. I explained the natural history of the disease process and Rx rationale.  I explained to the patient what I considered to be reasonable expectations given their personal situation.  The final treatment plan was arrived at through a shared patient decision making  process model.          I personally saw and evaluated the patient, and participated in the management and treatment plan.   Huel Cote, MD Attending Physician, Orthopedic Surgery   This document was dictated using Dragon voice recognition software. A reasonable attempt at proof reading has been made to minimize errors.

## 2023-04-25 NOTE — Anesthesia Postprocedure Evaluation (Signed)
Anesthesia Post Note  Patient: KEALYN ALBERTELLI  Procedure(s) Performed: RIGHT SHOULDER ARTHROSCOPY WITH ROTATOR CUFF REPAIR, ACROMIOPLASTY (Right: Shoulder)     Patient location during evaluation: PACU Anesthesia Type: General and Regional Level of consciousness: awake and alert Pain management: pain level controlled Vital Signs Assessment: post-procedure vital signs reviewed and stable Respiratory status: spontaneous breathing, nonlabored ventilation and respiratory function stable Cardiovascular status: blood pressure returned to baseline and stable Postop Assessment: no apparent nausea or vomiting Anesthetic complications: no  No notable events documented.  Last Vitals:  Vitals:   04/25/23 1415 04/25/23 1445  BP: (!) 175/81 (!) 168/83  Pulse: 70 68  Resp: 11 16  Temp: 36.4 C 36.4 C  SpO2: 96% 92%    Last Pain:  Vitals:   04/25/23 1445  PainSc: 0-No pain                 Oliviah Agostini,W. EDMOND

## 2023-04-25 NOTE — Interval H&P Note (Signed)
History and Physical Interval Note:  04/25/2023 11:05 AM  Misty Morris  has presented today for surgery, with the diagnosis of RIGHT SHOULDER ROTATOR CUFF TEAR.  The various methods of treatment have been discussed with the patient and family. After consideration of risks, benefits and other options for treatment, the patient has consented to  Procedure(s): RIGHT SHOULDER ARTHROSCOPY WITH ROTATOR CUFF REPAIR / DISTAL CLAVICLE RESECTION / POSSIBLE PATCH AUGMENTATION (Right) as a surgical intervention.  The patient's history has been reviewed, patient examined, no change in status, stable for surgery.  I have reviewed the patient's chart and labs.  Questions were answered to the patient's satisfaction.     Huel Cote

## 2023-04-25 NOTE — Brief Op Note (Signed)
   Brief Op Note  Date of Surgery: 04/25/2023  Preoperative Diagnosis: RIGHT SHOULDER ROTATOR CUFF TEAR  Postoperative Diagnosis: same  Procedure: Procedure(s): RIGHT SHOULDER ARTHROSCOPY WITH ROTATOR CUFF REPAIR, ACROMIOPLASTY, POSSIBLE COLLAGEN PATCH AUGMENTATION  Implants: Implant Name Type Inv. Item Serial No. Manufacturer Lot No. LRB No. Used Action  JuggerKnot Soft Anchor    ZIMMER [id number] Right 1 Implanted    Surgeons: Surgeon(s): Huel Cote, MD  Anesthesia: General    Estimated Blood Loss: See anesthesia record  Complications: None  Condition to PACU: Stable  Benancio Deeds, MD 04/25/2023 1:57 PM

## 2023-04-25 NOTE — Anesthesia Procedure Notes (Signed)
Procedure Name: Intubation Date/Time: 04/25/2023 12:30 PM  Performed by: Kayleen Memos, CRNAPre-anesthesia Checklist: Patient identified, Emergency Drugs available, Suction available and Patient being monitored Patient Re-evaluated:Patient Re-evaluated prior to induction Oxygen Delivery Method: Circle System Utilized Preoxygenation: Pre-oxygenation with 100% oxygen Induction Type: IV induction Ventilation: Mask ventilation without difficulty Laryngoscope Size: Mac and 4 Grade View: Grade I Tube type: Oral Number of attempts: 1 Airway Equipment and Method: Stylet and Oral airway Placement Confirmation: ETT inserted through vocal cords under direct vision, positive ETCO2 and breath sounds checked- equal and bilateral Secured at: 21 cm Tube secured with: Tape Dental Injury: Teeth and Oropharynx as per pre-operative assessment

## 2023-04-25 NOTE — Transfer of Care (Signed)
Immediate Anesthesia Transfer of Care Note  Patient: Misty Morris  Procedure(s) Performed: RIGHT SHOULDER ARTHROSCOPY WITH ROTATOR CUFF REPAIR, ACROMIOPLASTY, POSSIBLE COLLAGEN PATCH AUGMENTATION (Right: Shoulder)  Patient Location: PACU  Anesthesia Type:General and Regional  Level of Consciousness: awake, alert , and oriented  Airway & Oxygen Therapy: Patient Spontanous Breathing and Patient connected to nasal cannula oxygen  Post-op Assessment: Report given to RN and Post -op Vital signs reviewed and stable  Post vital signs: Reviewed and stable  Last Vitals:  Vitals Value Taken Time  BP 163/115 04/25/23 1408  Temp    Pulse 73 04/25/23 1411  Resp 16 04/25/23 1411  SpO2 96 % 04/25/23 1411  Vitals shown include unfiled device data.  Last Pain:  Vitals:   04/25/23 1011  PainSc: 8          Complications: No notable events documented.

## 2023-04-26 ENCOUNTER — Telehealth (HOSPITAL_BASED_OUTPATIENT_CLINIC_OR_DEPARTMENT_OTHER): Payer: Self-pay

## 2023-04-26 NOTE — Telephone Encounter (Signed)
Pt called office again and also wants to know how long she needs to keep her ted hose on. Please advise

## 2023-04-26 NOTE — Telephone Encounter (Signed)
Hey no please no aspirin, just the plavix is good. And she may take off the ted hose.

## 2023-04-26 NOTE — Telephone Encounter (Signed)
Pt called office and wants to make sure you still want her to take the aspirin since she is already on Plavix. Please advise

## 2023-04-26 NOTE — Telephone Encounter (Signed)
Called and advised pt. She stated understanding

## 2023-04-27 ENCOUNTER — Encounter (HOSPITAL_BASED_OUTPATIENT_CLINIC_OR_DEPARTMENT_OTHER): Payer: Self-pay | Admitting: Physical Therapy

## 2023-04-27 ENCOUNTER — Ambulatory Visit (HOSPITAL_BASED_OUTPATIENT_CLINIC_OR_DEPARTMENT_OTHER): Payer: PPO | Attending: Orthopaedic Surgery | Admitting: Physical Therapy

## 2023-04-27 ENCOUNTER — Other Ambulatory Visit: Payer: Self-pay

## 2023-04-27 DIAGNOSIS — X58XXXA Exposure to other specified factors, initial encounter: Secondary | ICD-10-CM | POA: Diagnosis not present

## 2023-04-27 DIAGNOSIS — M6281 Muscle weakness (generalized): Secondary | ICD-10-CM | POA: Insufficient documentation

## 2023-04-27 DIAGNOSIS — M25511 Pain in right shoulder: Secondary | ICD-10-CM | POA: Insufficient documentation

## 2023-04-27 DIAGNOSIS — S46011A Strain of muscle(s) and tendon(s) of the rotator cuff of right shoulder, initial encounter: Secondary | ICD-10-CM | POA: Diagnosis not present

## 2023-04-27 DIAGNOSIS — R29898 Other symptoms and signs involving the musculoskeletal system: Secondary | ICD-10-CM

## 2023-04-27 NOTE — Therapy (Signed)
OUTPATIENT PHYSICAL THERAPY SHOULDER EVALUATION   Patient Name: Misty Morris MRN: [id number] DOB:1951/07/18, 72 y.o., female Today's Date: 04/27/2023  END OF SESSION:  PT End of Session - 04/27/23 0746     Visit Number 1    Number of Visits 24    Date for PT Re-Evaluation 07/20/23    Authorization Type [employer]    Progress Note Due on Visit 10    PT Start Time 0800    PT Stop Time 0840    PT Time Calculation (min) 40 min    Activity Tolerance Patient tolerated treatment well    Behavior During Therapy [facility] for tasks assessed/performed             Past Medical History:  Diagnosis Date   Cancer (HCC)    hx of thyroid cancer   Fibromyalgia    Hypercholesterolemia    Hypertension    Hypothyroidism    thyroid removed   Polymyalgia rheumatica (HCC)    Rheumatoid arthritis (HCC)    "atypical/MD 08/2017" (02/14/2018)   Shingles    Stroke (HCC) 08/2017   "no residual" (02/14/2018)   TIA (transient ischemic attack) 02/14/2018   Type 2 diabetes, diet controlled (HCC)    Vision impairment    right eye   Past Surgical History:  Procedure Laterality Date   CATARACT EXTRACTION W/ INTRAOCULAR LENS  IMPLANT, BILATERAL     COLONOSCOPY WITH PROPOFOL N/A 10/12/2019   Procedure: COLONOSCOPY WITH PROPOFOL;  Surgeon: Jeani Hawking, MD;  Location: [facility];  Service: Endoscopy;  Laterality: N/A;   COLONOSCOPY WITH PROPOFOL N/A 12/24/2022   Procedure: COLONOSCOPY WITH PROPOFOL;  Surgeon: Jeani Hawking, MD;  [facility]: [facility];  Service: Gastroenterology;  Laterality: N/A;   HEMOSTASIS CLIP PLACEMENT  10/12/2019   Procedure: HEMOSTASIS CLIP PLACEMENT;  Surgeon: Jeani Hawking, MD;  [facility]: [facility];  Service: Endoscopy;;   JOINT REPLACEMENT     OPEN REDUCTION LE FORT I FRACTURE  1980s   PARTIAL KNEE ARTHROPLASTY Left 01/17/2017   Procedure: UNICOMPARTMENTAL LEFT KNEE;  Surgeon: Durene Romans, MD;  Location: [facility];  Service: Orthopedics;  Laterality: Left;    POLYPECTOMY  10/12/2019   Procedure: POLYPECTOMY;  Surgeon: Jeani Hawking, MD;  [facility];  Service: Endoscopy;;   RADIOLOGY WITH ANESTHESIA N/A 08/11/2017   Procedure: MRI OF BRAIN WITH AND WITHOUT CONSTRAST, AND OF THE ORBIT WITH AND WITHOUT CONTRAST;  Surgeon: Radiologist, Medication, MD;  [facility]: [facility];  Service: Radiology;  Laterality: N/A;   RADIOLOGY WITH ANESTHESIA N/A 09/06/2017   Procedure: MRI OF BRAIN;  Surgeon: Radiologist, Medication, MD;  [facility];  Service: Radiology;  Laterality: N/A;   RADIOLOGY WITH ANESTHESIA Right 03/03/2023   Procedure: MRI SHOULDER WITHOUT CONTRAST WITH ANESTHESIA;  Surgeon: Radiologist, Medication, MD;  [facility]: [facility];  Service: Radiology;  Laterality: Right;   RHINOPLASTY     TOTAL KNEE ARTHROPLASTY Right 09/30/2015   Procedure: TOTAL RIGHT KNEE ARTHROPLASTY;  Surgeon: Durene Romans, MD;  [facility];  Service: Orthopedics;  Laterality: Right;   VAGINAL HYSTERECTOMY     Patient Active Problem List   Diagnosis Date Noted   UTI (urinary tract infection) 10/07/2018   Sepsis (HCC) 10/07/2018   Hypokalemia 10/07/2018   S/P total thyroidectomy 10/07/2018   Rheumatoid arthritis (HCC) 09/18/2018   Papillary thyroid carcinoma (HCC) 08/24/2018   TIA (transient ischemic attack) 02/14/2018   Hyperlipidemia    Ischemic optic neuropathy of right eye    Stroke (HCC) 09/04/2017   HTN (hypertension)  09/04/2017   DM (diabetes mellitus) (HCC) 09/04/2017   Polymyalgia rheumatica (HCC) 09/04/2017   Fibromyalgia 09/04/2017   S/P left UKR 01/17/2017   Overweight (BMI 25.0-29.9) 10/01/2015   S/P right TKA 09/30/2015    PCP: Irena Reichmann DO  REFERRING PROVIDER: Huel Cote, MD   REFERRING DIAG: [phone] (ICD-10-CM) - Traumatic complete tear of right rotator cuff, initial encounter  THERAPY DIAG:  Right shoulder pain, unspecified chronicity  Muscle weakness (generalized)  Other symptoms and signs involving the musculoskeletal  system  Traumatic complete tear of right rotator cuff, initial encounter  Rationale for Evaluation and Treatment: Rehabilitation  ONSET DATE: DOS 04/25/23  R RCR  SUBJECTIVE:                                                                                                                                                                                      SUBJECTIVE STATEMENT: Patient states shoulder doing alright since R RCR on 04/25/23. Been taking Motrin to help with pain. Nerve block has warn off. Getting dressed has been difficult.   Hand dominance: Right  PERTINENT HISTORY: Hx cancer, fibromyalgia, HTN, HLD, RA, Hx CVA/TIA, DM  PAIN:  Are you having pain? Yes: NPRS scale: 7-8 with accidental movement, rest 0/10 Pain location: R shoulder Pain description: sharp, ache Aggravating factors: accidental movement Relieving factors: rest  PRECAUTIONS: Shoulder R RCR  RED FLAGS: None   WEIGHT BEARING RESTRICTIONS:  NWB RUE  FALLS:  Has patient fallen in last 6 months? No   PLOF: Independent  PATIENT GOALS:get this arm working again    OBJECTIVE: (objective measures from initial evaluation unless otherwise dated) Observation: sutures intact, healing portals  PATIENT SURVEYS:  FOTO 29% function  COGNITION: Overall cognitive status: Within functional limits for tasks assessed     SENSATION: WFL  POSTURE: rounded shoulders and forward head   UPPER EXTREMITY ROM: NT due to post op status  Passive ROM Right eval Left eval  Shoulder flexion    Shoulder extension    Shoulder abduction    Shoulder adduction    Shoulder internal rotation    Shoulder external rotation    Elbow flexion    Elbow extension    Wrist flexion    Wrist extension    Wrist ulnar deviation    Wrist radial deviation    Wrist pronation    Wrist supination    (Blank rows = not tested) *=pain/symptoms  UPPER EXTREMITY MMT: NT due to post op status  MMT Right eval Left eval  Shoulder  flexion    Shoulder extension    Shoulder abduction    Shoulder adduction    Shoulder internal rotation  Shoulder external rotation    Middle trapezius    Lower trapezius    Elbow flexion    Elbow extension    Wrist flexion    Wrist extension    Wrist ulnar deviation    Wrist radial deviation    Wrist pronation    Wrist supination    Grip strength (lbs)    (Blank rows = not tested) *=pain/symptoms   JOINT MOBILITY TESTING:  NT due to post op status  PALPATION:  Minimal tenderness in R shoulder   TODAY'S TREATMENT:                                                                                                                                         DATE:  04/27/23 Eval, dressing changes, education on protocol  Pendulums Elbow ROM  Wrist ROM  Gripping   PATIENT EDUCATION:  Education details: Patient educated on exam findings, POC, scope of PT, HEP, and precautions/protocol. Person educated: Patient Education method: Explanation, Demonstration, and Handouts Education comprehension: verbalized understanding, returned demonstration, verbal cues required, and tactile cues required  HOME EXERCISE PROGRAM: Access Code: [email or web address] URL: [email or web address]  Date: 04/27/2023 - Circular Shoulder Pendulum with Table Support  - 5 x daily - 7 x weekly - 3 reps - 30 second hold - Seated Elbow Flexion AAROM  - 5 x daily - 7 x weekly - 2 sets - 10 reps - Wrist Flexion Extension AROM - Palms Down  - 5 x daily - 7 x weekly - 2 sets - 10 reps - Seated Gripping Towel  - 5 x daily - 7 x weekly - 2 sets - 10 reps  ASSESSMENT:  CLINICAL IMPRESSION: Patient a 72 y.o. y.o. female who was seen today for physical therapy evaluation and treatment for R RCR. Changed dressing and educated on protocol/precautions. Patient presents with pain limited deficits in R shoulder strength, ROM, endurance, activity tolerance, and functional mobility with ADL. Patient is having to modify and  restrict ADL as indicated by outcome measure score as well as subjective information and objective measures which is affecting overall participation. Patient will benefit from skilled physical therapy in order to improve function and reduce impairment.  OBJECTIVE IMPAIRMENTS: decreased activity tolerance, decreased endurance, decreased mobility, decreased ROM, decreased strength, impaired flexibility, impaired UE functional use, improper body mechanics, postural dysfunction, and pain.   ACTIVITY LIMITATIONS: carrying, lifting, bending, bed mobility, bathing, dressing, self feeding, reach over head, hygiene/grooming, locomotion level, and caring for others  PARTICIPATION LIMITATIONS: meal prep, cleaning, laundry, driving, shopping, community activity, and yard work  PERSONAL FACTORS: 3+ comorbidities: Hx cancer, fibromyalgia, HTN, HLD, RA, Hx CVA/TIA, DM  are also affecting patient's functional outcome.   REHAB POTENTIAL: Good  CLINICAL DECISION MAKING: Stable/uncomplicated  EVALUATION COMPLEXITY: Low   GOALS: Goals reviewed with patient? Yes  SHORT TERM GOALS: Target date: 05/25/2023    Patient will be independent with  HEP in order to improve functional outcomes. Baseline: Goal status: INITIAL  2.  Patient will report at least 25% improvement in symptoms for improved quality of life. Baseline:  Goal status: INITIAL  3.  Patient will tolerate beginning to wean from sling with MD approval for improved functional use of RUE.  Baseline:  Goal status: INITIAL    LONG TERM GOALS: Target date: 07/20/2023    Patient will report at least 75% improvement in symptoms for improved quality of life. Baseline:  Goal status: INITIAL  2.  Patient will improve FOTO score by at least 32 points in order to indicate improved tolerance to activity. Baseline: 29% function Goal status: INITIAL  3.  Patient will demonstrate at least 150 degrees in shoulder AROM in flexion for improved ability  lift overhead. Baseline:  Goal status: INITIAL  4.  Patient will demonstrate grade of at least 4+/5 MMT grade in all tested musculature as evidence of improved strength to assist with lifting at home. Baseline:  Goal status: INITIAL  5.  Patient will be progressing with resisted strengthening in order to build strength for chores and exercise.  Baseline:  Goal status: INITIAL    PLAN:  PT FREQUENCY: 1-2x/week  PT DURATION: 12 weeks  PLANNED INTERVENTIONS: Therapeutic exercises, Therapeutic activity, Neuromuscular re-education, Balance training, Gait training, Patient/Family education, Joint manipulation, Joint mobilization, Stair training, Orthotic/Fit training, DME instructions, Aquatic Therapy, Dry Needling, Electrical stimulation, Spinal manipulation, Spinal mobilization, Cryotherapy, Moist heat, Compression bandaging, scar mobilization, Splintting, Taping, Traction, Ultrasound, Ionotophoresis 4mg /ml Dexamethasone, and Manual therapy   PLAN FOR NEXT SESSION: Continue to progress per Dr. Serena Croissant RCR protocol   Reola Mosher Annet Manukyan, PT 04/27/2023, 8:41 AM

## 2023-04-28 ENCOUNTER — Ambulatory Visit (HOSPITAL_BASED_OUTPATIENT_CLINIC_OR_DEPARTMENT_OTHER): Payer: PPO | Admitting: Physical Therapy

## 2023-04-28 ENCOUNTER — Ambulatory Visit (HOSPITAL_BASED_OUTPATIENT_CLINIC_OR_DEPARTMENT_OTHER): Payer: PPO

## 2023-04-28 ENCOUNTER — Encounter (HOSPITAL_BASED_OUTPATIENT_CLINIC_OR_DEPARTMENT_OTHER): Payer: Self-pay

## 2023-04-28 DIAGNOSIS — S46011A Strain of muscle(s) and tendon(s) of the rotator cuff of right shoulder, initial encounter: Secondary | ICD-10-CM | POA: Diagnosis not present

## 2023-04-28 DIAGNOSIS — M6281 Muscle weakness (generalized): Secondary | ICD-10-CM

## 2023-04-28 DIAGNOSIS — R29898 Other symptoms and signs involving the musculoskeletal system: Secondary | ICD-10-CM

## 2023-04-28 DIAGNOSIS — M25511 Pain in right shoulder: Secondary | ICD-10-CM

## 2023-04-28 NOTE — Therapy (Signed)
OUTPATIENT PHYSICAL THERAPY SHOULDER EVALUATION   Patient Name: Misty Morris MRN: [id number] DOB:[birth year], 72 y.o., female Today's Date: 04/28/2023  END OF SESSION:  PT End of Session - 04/28/23 0955     Visit Number 2    Number of Visits 24    Date for PT Re-Evaluation 07/20/23    Authorization Type [employer]    Progress Note Due on Visit 10    PT Start Time 0955    PT Stop Time 1030    PT Time Calculation (min) 35 min    Activity Tolerance Patient tolerated treatment well    Behavior During Therapy [facility] for tasks assessed/performed              Past Medical History:  Diagnosis Date   Cancer (HCC)    hx of thyroid cancer   Fibromyalgia    Hypercholesterolemia    Hypertension    Hypothyroidism    thyroid removed   Polymyalgia rheumatica (HCC)    Rheumatoid arthritis (HCC)    "atypical/MD 08/2017" (02/14/2018)   Shingles    Stroke (HCC) 08/2017   "no residual" (02/14/2018)   TIA (transient ischemic attack) 02/14/2018   Type 2 diabetes, diet controlled (HCC)    Vision impairment    right eye   Past Surgical History:  Procedure Laterality Date   CATARACT EXTRACTION W/ INTRAOCULAR LENS  IMPLANT, BILATERAL     COLONOSCOPY WITH PROPOFOL N/A 10/12/2019   Procedure: COLONOSCOPY WITH PROPOFOL;  Surgeon: Jeani Hawking, MD;  [facility];  Service: Endoscopy;  Laterality: N/A;   COLONOSCOPY WITH PROPOFOL N/A 12/24/2022   Procedure: COLONOSCOPY WITH PROPOFOL;  Surgeon: Jeani Hawking, MD;  [facility]: [facility];  Service: Gastroenterology;  Laterality: N/A;   HEMOSTASIS CLIP PLACEMENT  10/12/2019   Procedure: HEMOSTASIS CLIP PLACEMENT;  Surgeon: Jeani Hawking, MD;  [facility];  Service: Endoscopy;;   JOINT REPLACEMENT     OPEN REDUCTION LE FORT I FRACTURE  1980s   PARTIAL KNEE ARTHROPLASTY Left 01/17/2017   Procedure: UNICOMPARTMENTAL LEFT KNEE;  Surgeon: Durene Romans, MD;  [facility]: [facility];  Service: Orthopedics;  Laterality: Left;    POLYPECTOMY  10/12/2019   Procedure: POLYPECTOMY;  Surgeon: Jeani Hawking, MD;  [facility];  Service: Endoscopy;;   RADIOLOGY WITH ANESTHESIA N/A 08/11/2017   Procedure: MRI OF BRAIN WITH AND WITHOUT CONSTRAST, AND OF THE ORBIT WITH AND WITHOUT CONTRAST;  Surgeon: Radiologist, Medication, MD;  [facility]: [facility];  Service: Radiology;  Laterality: N/A;   RADIOLOGY WITH ANESTHESIA N/A 09/06/2017   Procedure: MRI OF BRAIN;  Surgeon: Radiologist, Medication, MD;  [facility];  Service: Radiology;  Laterality: N/A;   RADIOLOGY WITH ANESTHESIA Right 03/03/2023   Procedure: MRI SHOULDER WITHOUT CONTRAST WITH ANESTHESIA;  Surgeon: Radiologist, Medication, MD;  [facility];  Service: Radiology;  Laterality: Right;   RHINOPLASTY     SHOULDER ARTHROSCOPY WITH ROTATOR CUFF REPAIR Right 04/25/2023   Procedure: RIGHT SHOULDER ARTHROSCOPY WITH ROTATOR CUFF REPAIR, ACROMIOPLASTY;  Surgeon: Huel Cote, MD;  [facility];  Service: Orthopedics;  Laterality: Right;   TOTAL KNEE ARTHROPLASTY Right 09/30/2015   Procedure: TOTAL RIGHT KNEE ARTHROPLASTY;  Surgeon: Durene Romans, MD;  [facility];  Service: Orthopedics;  Laterality: Right;   VAGINAL HYSTERECTOMY     Patient Active Problem List   Diagnosis Date Noted   UTI (urinary tract infection) 10/07/2018   Sepsis (HCC) 10/07/2018   Hypokalemia 10/07/2018   S/P total thyroidectomy 10/07/2018   Rheumatoid arthritis (HCC)  09/18/2018   Papillary thyroid carcinoma (HCC) 08/24/2018   TIA (transient ischemic attack) 02/14/2018   Hyperlipidemia    Ischemic optic neuropathy of right eye    Stroke (HCC) 09/04/2017   HTN (hypertension) 09/04/2017   DM (diabetes mellitus) (HCC) 09/04/2017   Polymyalgia rheumatica (HCC) 09/04/2017   Fibromyalgia 09/04/2017   S/P left UKR 01/17/2017   Overweight (BMI 25.0-29.9) 10/01/2015   S/P right TKA 09/30/2015    PCP: Irena Reichmann DO  REFERRING PROVIDER: Huel Cote, MD   REFERRING DIAG: [phone]  (ICD-10-CM) - Traumatic complete tear of right rotator cuff, initial encounter  THERAPY DIAG:  Right shoulder pain, unspecified chronicity  Muscle weakness (generalized)  Other symptoms and signs involving the musculoskeletal system  Traumatic complete tear of right rotator cuff, initial encounter  Rationale for Evaluation and Treatment: Rehabilitation  ONSET DATE: DOS 04/25/23  R RCR  SUBJECTIVE:                                                                                                                                                                                      SUBJECTIVE STATEMENT: Patient states shoulder doing alright since R RCR on 04/25/23. Been taking Motrin to help with pain. Nerve block has warn off. Getting dressed has been difficult.   Hand dominance: Right  PERTINENT HISTORY: Hx cancer, fibromyalgia, HTN, HLD, RA, Hx CVA/TIA, DM  PAIN:  Are you having pain? Yes: NPRS scale: 7-8 with accidental movement, rest 0/10 Pain location: R shoulder Pain description: sharp, ache Aggravating factors: accidental movement Relieving factors: rest  PRECAUTIONS: Shoulder R RCR  RED FLAGS: None   WEIGHT BEARING RESTRICTIONS:  NWB RUE  FALLS:  Has patient fallen in last 6 months? No   PLOF: Independent  PATIENT GOALS:get this arm working again    OBJECTIVE: (objective measures from initial evaluation unless otherwise dated) Observation: sutures intact, healing portals  PATIENT SURVEYS:  FOTO 29% function  COGNITION: Overall cognitive status: Within functional limits for tasks assessed     SENSATION: WFL  POSTURE: rounded shoulders and forward head   UPPER EXTREMITY ROM: NT due to post op status  Passive ROM Right eval Left eval  Shoulder flexion    Shoulder extension    Shoulder abduction    Shoulder adduction    Shoulder internal rotation    Shoulder external rotation    Elbow flexion    Elbow extension    Wrist flexion    Wrist  extension    Wrist ulnar deviation    Wrist radial deviation    Wrist pronation    Wrist supination    (Blank rows = not tested) *=pain/symptoms  UPPER EXTREMITY  MMT: NT due to post op status  MMT Right eval Left eval  Shoulder flexion    Shoulder extension    Shoulder abduction    Shoulder adduction    Shoulder internal rotation    Shoulder external rotation    Middle trapezius    Lower trapezius    Elbow flexion    Elbow extension    Wrist flexion    Wrist extension    Wrist ulnar deviation    Wrist radial deviation    Wrist pronation    Wrist supination    Grip strength (lbs)    (Blank rows = not tested) *=pain/symptoms   JOINT MOBILITY TESTING:  NT due to post op status  PALPATION:  Minimal tenderness in R shoulder   TODAY'S TREATMENT:                                                                                                                                         DATE:   04/28/23 PROM R shoulder Scap retraction (sling donned) 2x10 Upper trap stretch 30seconds Edu on sling use, protocol, recovery timeline, HEP   04/27/23 Eval, dressing changes, education on protocol  Pendulums Elbow ROM  Wrist ROM  Gripping   PATIENT EDUCATION:  Education details: Patient educated on exam findings, POC, scope of PT, HEP, and precautions/protocol. Person educated: Patient Education method: Explanation, Demonstration, and Handouts Education comprehension: verbalized understanding, returned demonstration, verbal cues required, and tactile cues required  HOME EXERCISE PROGRAM: Access Code: [email or web address] URL: [email or web address]  Date: 04/27/2023 - Circular Shoulder Pendulum with Table Support  - 5 x daily - 7 x weekly - 3 reps - 30 second hold - Seated Elbow Flexion AAROM  - 5 x daily - 7 x weekly - 2 sets - 10 reps - Wrist Flexion Extension AROM - Palms Down  - 5 x daily - 7 x weekly - 2 sets - 10 reps - Seated Gripping Towel  - 5 x daily - 7 x  weekly - 2 sets - 10 reps  ASSESSMENT:  CLINICAL IMPRESSION: Good tolerance for first session after evaluation. She did guard with PROM, but this improved with frequent cues for muscle relaxation. No pain reported with gentle seated scapular retraction. Educated pt on use of ice often throughout the day as well as sling use and HEP performance. Will continue to progress as tolerated.   OBJECTIVE IMPAIRMENTS: decreased activity tolerance, decreased endurance, decreased mobility, decreased ROM, decreased strength, impaired flexibility, impaired UE functional use, improper body mechanics, postural dysfunction, and pain.   ACTIVITY LIMITATIONS: carrying, lifting, bending, bed mobility, bathing, dressing, self feeding, reach over head, hygiene/grooming, locomotion level, and caring for others  PARTICIPATION LIMITATIONS: meal prep, cleaning, laundry, driving, shopping, community activity, and yard work  PERSONAL FACTORS: 3+ comorbidities: Hx cancer, fibromyalgia, HTN, HLD, RA, Hx CVA/TIA, DM  are also affecting patient's functional outcome.   REHAB POTENTIAL: Good  CLINICAL DECISION MAKING: Stable/uncomplicated  EVALUATION COMPLEXITY: Low   GOALS: Goals reviewed with patient? Yes  SHORT TERM GOALS: Target date: 05/25/2023    Patient will be independent with HEP in order to improve functional outcomes. Baseline: Goal status: INITIAL  2.  Patient will report at least 25% improvement in symptoms for improved quality of life. Baseline:  Goal status: INITIAL  3.  Patient will tolerate beginning to wean from sling with MD approval for improved functional use of RUE.  Baseline:  Goal status: INITIAL    LONG TERM GOALS: Target date: 07/20/2023    Patient will report at least 75% improvement in symptoms for improved quality of life. Baseline:  Goal status: INITIAL  2.  Patient will improve FOTO score by at least 32 points in order to indicate improved tolerance to  activity. Baseline: 29% function Goal status: INITIAL  3.  Patient will demonstrate at least 150 degrees in shoulder AROM in flexion for improved ability lift overhead. Baseline:  Goal status: INITIAL  4.  Patient will demonstrate grade of at least 4+/5 MMT grade in all tested musculature as evidence of improved strength to assist with lifting at home. Baseline:  Goal status: INITIAL  5.  Patient will be progressing with resisted strengthening in order to build strength for chores and exercise.  Baseline:  Goal status: INITIAL    PLAN:  PT FREQUENCY: 1-2x/week  PT DURATION: 12 weeks  PLANNED INTERVENTIONS: Therapeutic exercises, Therapeutic activity, Neuromuscular re-education, Balance training, Gait training, Patient/Family education, Joint manipulation, Joint mobilization, Stair training, Orthotic/Fit training, DME instructions, Aquatic Therapy, Dry Needling, Electrical stimulation, Spinal manipulation, Spinal mobilization, Cryotherapy, Moist heat, Compression bandaging, scar mobilization, Splintting, Taping, Traction, Ultrasound, Ionotophoresis 4mg /ml Dexamethasone, and Manual therapy   PLAN FOR NEXT SESSION: Continue to progress per Dr. Serena Croissant RCR protocol   Donnel Saxon Kaynan Klonowski, PTA 04/28/2023, 10:52 AM

## 2023-05-01 DIAGNOSIS — B370 Candidal stomatitis: Secondary | ICD-10-CM | POA: Diagnosis not present

## 2023-05-03 ENCOUNTER — Ambulatory Visit (HOSPITAL_BASED_OUTPATIENT_CLINIC_OR_DEPARTMENT_OTHER): Payer: PPO

## 2023-05-03 ENCOUNTER — Encounter (HOSPITAL_BASED_OUTPATIENT_CLINIC_OR_DEPARTMENT_OTHER): Payer: Self-pay

## 2023-05-03 DIAGNOSIS — S46011A Strain of muscle(s) and tendon(s) of the rotator cuff of right shoulder, initial encounter: Secondary | ICD-10-CM

## 2023-05-03 DIAGNOSIS — M25511 Pain in right shoulder: Secondary | ICD-10-CM

## 2023-05-03 DIAGNOSIS — M6281 Muscle weakness (generalized): Secondary | ICD-10-CM

## 2023-05-03 DIAGNOSIS — R29898 Other symptoms and signs involving the musculoskeletal system: Secondary | ICD-10-CM

## 2023-05-03 NOTE — Therapy (Signed)
OUTPATIENT PHYSICAL THERAPY SHOULDER TREATMENT   Patient Name: Misty Morris MRN: [id number] DOB:Dec 12, 1950, 72 y.o., female Today's Date: 05/03/2023  END OF SESSION:  PT End of Session - 05/03/23 1216     Visit Number 3    Number of Visits 24    Date for PT Re-Evaluation 07/20/23    Authorization Type [employer]    Progress Note Due on Visit 10    PT Start Time 1147    PT Stop Time 1230    PT Time Calculation (min) 43 min    Activity Tolerance Patient tolerated treatment well    Behavior During Therapy WFL for tasks assessed/performed               Past Medical History:  Diagnosis Date   Cancer (HCC)    hx of thyroid cancer   Fibromyalgia    Hypercholesterolemia    Hypertension    Hypothyroidism    thyroid removed   Polymyalgia rheumatica (HCC)    Rheumatoid arthritis (HCC)    "atypical/MD 08/2017" (02/14/2018)   Shingles    Stroke (HCC) 08/2017   "no residual" (02/14/2018)   TIA (transient ischemic attack) 02/14/2018   Type 2 diabetes, diet controlled (HCC)    Vision impairment    right eye   Past Surgical History:  Procedure Laterality Date   CATARACT EXTRACTION W/ INTRAOCULAR LENS  IMPLANT, BILATERAL     COLONOSCOPY WITH PROPOFOL N/A 10/12/2019   Procedure: COLONOSCOPY WITH PROPOFOL;  Surgeon: Jeani Hawking, MD;  Location: [facility];  Service: Endoscopy;  Laterality: N/A;   COLONOSCOPY WITH PROPOFOL N/A 12/24/2022   Procedure: COLONOSCOPY WITH PROPOFOL;  Surgeon: Jeani Hawking, MD;  Location: [facility];  Service: Gastroenterology;  Laterality: N/A;   HEMOSTASIS CLIP PLACEMENT  10/12/2019   Procedure: HEMOSTASIS CLIP PLACEMENT;  Surgeon: Jeani Hawking, MD;  Location: [facility];  Service: Endoscopy;;   JOINT REPLACEMENT     OPEN REDUCTION LE FORT I FRACTURE  1980s   PARTIAL KNEE ARTHROPLASTY Left 01/17/2017   Procedure: UNICOMPARTMENTAL LEFT KNEE;  Surgeon: Durene Romans, MD;  Location: [facility];  Service: Orthopedics;  Laterality: Left;    POLYPECTOMY  10/12/2019   Procedure: POLYPECTOMY;  Surgeon: Jeani Hawking, MD;  Location: [facility];  Service: Endoscopy;;   RADIOLOGY WITH ANESTHESIA N/A 08/11/2017   Procedure: MRI OF BRAIN WITH AND WITHOUT CONSTRAST, AND OF THE ORBIT WITH AND WITHOUT CONTRAST;  Surgeon: Radiologist, Medication, MD;  Location: [facility];  Service: Radiology;  Laterality: N/A;   RADIOLOGY WITH ANESTHESIA N/A 09/06/2017   Procedure: MRI OF BRAIN;  Surgeon: Radiologist, Medication, MD;  Location: [facility];  Service: Radiology;  Laterality: N/A;   RADIOLOGY WITH ANESTHESIA Right 03/03/2023   Procedure: MRI SHOULDER WITHOUT CONTRAST WITH ANESTHESIA;  Surgeon: Radiologist, Medication, MD;  Location: [facility];  Service: Radiology;  Laterality: Right;   RHINOPLASTY     SHOULDER ARTHROSCOPY WITH ROTATOR CUFF REPAIR Right 04/25/2023   Procedure: RIGHT SHOULDER ARTHROSCOPY WITH ROTATOR CUFF REPAIR, ACROMIOPLASTY;  Surgeon: Huel Cote, MD;  Location: [facility];  Service: Orthopedics;  Laterality: Right;   TOTAL KNEE ARTHROPLASTY Right 09/30/2015   Procedure: TOTAL RIGHT KNEE ARTHROPLASTY;  Surgeon: Durene Romans, MD;  Location: [facility];  Service: Orthopedics;  Laterality: Right;   VAGINAL HYSTERECTOMY     Patient Active Problem List   Diagnosis Date Noted   UTI (urinary tract infection) 10/07/2018   Sepsis (HCC) 10/07/2018   Hypokalemia 10/07/2018   S/P total thyroidectomy 10/07/2018   Rheumatoid arthritis (  HCC) 09/18/2018   Papillary thyroid carcinoma (HCC) 08/24/2018   TIA (transient ischemic attack) 02/14/2018   Hyperlipidemia    Ischemic optic neuropathy of right eye    Stroke (HCC) 09/04/2017   HTN (hypertension) 09/04/2017   DM (diabetes mellitus) (HCC) 09/04/2017   Polymyalgia rheumatica (HCC) 09/04/2017   Fibromyalgia 09/04/2017   S/P left UKR 01/17/2017   Overweight (BMI 25.0-29.9) 10/01/2015   S/P right TKA 09/30/2015    PCP: Irena Reichmann DO  REFERRING PROVIDER: Huel Cote, MD   REFERRING DIAG: [phone]  (ICD-10-CM) - Traumatic complete tear of right rotator cuff, initial encounter  THERAPY DIAG:  Right shoulder pain, unspecified chronicity  Muscle weakness (generalized)  Other symptoms and signs involving the musculoskeletal system  Traumatic complete tear of right rotator cuff, initial encounter  Rationale for Evaluation and Treatment: Rehabilitation  ONSET DATE: DOS 04/25/23  R RCR  SUBJECTIVE:                                                                                                                                                                                      SUBJECTIVE STATEMENT: Pt reports she was able to wash hair in the sink. Has been careful to avoid using R UE. "I'm surprised the pain hasn't been bad."   Hand dominance: Right  PERTINENT HISTORY: Hx cancer, fibromyalgia, HTN, HLD, RA, Hx CVA/TIA, DM  PAIN:  Are you having pain? Yes: NPRS scale: 7-8 with accidental movement, rest 0/10 Pain location: R shoulder Pain description: sharp, ache Aggravating factors: accidental movement Relieving factors: rest  PRECAUTIONS: Shoulder R RCR  RED FLAGS: None   WEIGHT BEARING RESTRICTIONS:  NWB RUE  FALLS:  Has patient fallen in last 6 months? No   PLOF: Independent  PATIENT GOALS:get this arm working again    OBJECTIVE: (objective measures from initial evaluation unless otherwise dated) Observation: sutures intact, healing portals  PATIENT SURVEYS:  FOTO 29% function  COGNITION: Overall cognitive status: Within functional limits for tasks assessed     SENSATION: WFL  POSTURE: rounded shoulders and forward head   UPPER EXTREMITY ROM: NT due to post op status  Passive ROM Right eval Left eval  Shoulder flexion    Shoulder extension    Shoulder abduction    Shoulder adduction    Shoulder internal rotation    Shoulder external rotation    Elbow flexion    Elbow extension    Wrist flexion    Wrist extension    Wrist ulnar deviation     Wrist radial deviation    Wrist pronation    Wrist supination    (Blank rows = not tested) *=pain/symptoms  UPPER EXTREMITY  MMT: NT due to post op status  MMT Right eval Left eval  Shoulder flexion    Shoulder extension    Shoulder abduction    Shoulder adduction    Shoulder internal rotation    Shoulder external rotation    Middle trapezius    Lower trapezius    Elbow flexion    Elbow extension    Wrist flexion    Wrist extension    Wrist ulnar deviation    Wrist radial deviation    Wrist pronation    Wrist supination    Grip strength (lbs)    (Blank rows = not tested) *=pain/symptoms   JOINT MOBILITY TESTING:  NT due to post op status  PALPATION:  Minimal tenderness in R shoulder   TODAY'S TREATMENT:                                                                                                                                         DATE:   05/03/23 PROM R shoulder (2 pillows under head, black wedge under knees) Scap retraction 2x15 Upper trap stretch 30seconds Wrist AROM flex/ext x30ea Elbow flexion 2x15 Ball squeeze 3x10   04/28/23 PROM R shoulder Scap retraction (sling donned) 2x10 Upper trap stretch 30seconds Edu on sling use, protocol, recovery timeline, HEP   04/27/23 Eval, dressing changes, education on protocol  Pendulums Elbow ROM  Wrist ROM  Gripping   PATIENT EDUCATION:  Education details: Patient educated on exam findings, POC, scope of PT, HEP, and precautions/protocol. Person educated: Patient Education method: Explanation, Demonstration, and Handouts Education comprehension: verbalized understanding, returned demonstration, verbal cues required, and tactile cues required  HOME EXERCISE PROGRAM: Access Code: [email or web address] URL: [email or web address]  Date: 04/27/2023 - Circular Shoulder Pendulum with Table Support  - 5 x daily - 7 x weekly - 3 reps - 30 second hold - Seated Elbow Flexion AAROM  - 5 x daily - 7 x weekly  - 2 sets - 10 reps - Wrist Flexion Extension AROM - Palms Down  - 5 x daily - 7 x weekly - 2 sets - 10 reps - Seated Gripping Towel  - 5 x daily - 7 x weekly - 2 sets - 10 reps  ASSESSMENT:  CLINICAL IMPRESSION: Excellent available PROM with occasional cues required for muscle relaxation. No complaints with scapular activation or distal joint segment AROM. Will continue to progress as tolerated with protocol.  OBJECTIVE IMPAIRMENTS: decreased activity tolerance, decreased endurance, decreased mobility, decreased ROM, decreased strength, impaired flexibility, impaired UE functional use, improper body mechanics, postural dysfunction, and pain.   ACTIVITY LIMITATIONS: carrying, lifting, bending, bed mobility, bathing, dressing, self feeding, reach over head, hygiene/grooming, locomotion level, and caring for others  PARTICIPATION LIMITATIONS: meal prep, cleaning, laundry, driving, shopping, community activity, and yard work  PERSONAL FACTORS: 3+ comorbidities: Hx cancer, fibromyalgia, HTN, HLD, RA, Hx CVA/TIA, DM  are also affecting patient's functional outcome.  REHAB POTENTIAL: Good  CLINICAL DECISION MAKING: Stable/uncomplicated  EVALUATION COMPLEXITY: Low   GOALS: Goals reviewed with patient? Yes  SHORT TERM GOALS: Target date: 05/25/2023    Patient will be independent with HEP in order to improve functional outcomes. Baseline: Goal status: INITIAL  2.  Patient will report at least 25% improvement in symptoms for improved quality of life. Baseline:  Goal status: INITIAL  3.  Patient will tolerate beginning to wean from sling with MD approval for improved functional use of RUE.  Baseline:  Goal status: INITIAL    LONG TERM GOALS: Target date: 07/20/2023    Patient will report at least 75% improvement in symptoms for improved quality of life. Baseline:  Goal status: INITIAL  2.  Patient will improve FOTO score by at least 32 points in order to indicate improved  tolerance to activity. Baseline: 29% function Goal status: INITIAL  3.  Patient will demonstrate at least 150 degrees in shoulder AROM in flexion for improved ability lift overhead. Baseline:  Goal status: INITIAL  4.  Patient will demonstrate grade of at least 4+/5 MMT grade in all tested musculature as evidence of improved strength to assist with lifting at home. Baseline:  Goal status: INITIAL  5.  Patient will be progressing with resisted strengthening in order to build strength for chores and exercise.  Baseline:  Goal status: INITIAL    PLAN:  PT FREQUENCY: 1-2x/week  PT DURATION: 12 weeks  PLANNED INTERVENTIONS: Therapeutic exercises, Therapeutic activity, Neuromuscular re-education, Balance training, Gait training, Patient/Family education, Joint manipulation, Joint mobilization, Stair training, Orthotic/Fit training, DME instructions, Aquatic Therapy, Dry Needling, Electrical stimulation, Spinal manipulation, Spinal mobilization, Cryotherapy, Moist heat, Compression bandaging, scar mobilization, Splintting, Taping, Traction, Ultrasound, Ionotophoresis 4mg /ml Dexamethasone, and Manual therapy   PLAN FOR NEXT SESSION: Continue to progress per Dr. Serena Croissant RCR protocol   Donnel Saxon Lynasia Meloche, PTA 05/03/2023, 1:29 PM

## 2023-05-04 ENCOUNTER — Ambulatory Visit (HOSPITAL_BASED_OUTPATIENT_CLINIC_OR_DEPARTMENT_OTHER): Payer: PPO | Admitting: Physical Therapy

## 2023-05-05 ENCOUNTER — Ambulatory Visit (HOSPITAL_BASED_OUTPATIENT_CLINIC_OR_DEPARTMENT_OTHER): Payer: PPO | Admitting: Physical Therapy

## 2023-05-05 DIAGNOSIS — M25511 Pain in right shoulder: Secondary | ICD-10-CM

## 2023-05-05 DIAGNOSIS — S46011A Strain of muscle(s) and tendon(s) of the rotator cuff of right shoulder, initial encounter: Secondary | ICD-10-CM | POA: Diagnosis not present

## 2023-05-05 DIAGNOSIS — M6281 Muscle weakness (generalized): Secondary | ICD-10-CM

## 2023-05-05 DIAGNOSIS — R29898 Other symptoms and signs involving the musculoskeletal system: Secondary | ICD-10-CM

## 2023-05-05 NOTE — Therapy (Signed)
OUTPATIENT PHYSICAL THERAPY SHOULDER TREATMENT   Patient Name: Misty Morris MRN: [id number] DOB:1951/06/08, 72 y.o., female Today's Date: 05/06/2023  END OF SESSION:  PT End of Session - 05/05/23 1539     Visit Number 4    Number of Visits 24    Date for PT Re-Evaluation 07/20/23    Authorization Type [employer]    PT Start Time 1541    PT Stop Time 1630    PT Time Calculation (min) 49 min    Activity Tolerance Patient tolerated treatment well    Behavior During Therapy WFL for tasks assessed/performed                Past Medical History:  Diagnosis Date   Cancer (HCC)    hx of thyroid cancer   Fibromyalgia    Hypercholesterolemia    Hypertension    Hypothyroidism    thyroid removed   Polymyalgia rheumatica (HCC)    Rheumatoid arthritis (HCC)    "atypical/MD 08/2017" (02/14/2018)   Shingles    Stroke (HCC) 08/2017   "no residual" (02/14/2018)   TIA (transient ischemic attack) 02/14/2018   Type 2 diabetes, diet controlled (HCC)    Vision impairment    right eye   Past Surgical History:  Procedure Laterality Date   CATARACT EXTRACTION W/ INTRAOCULAR LENS  IMPLANT, BILATERAL     COLONOSCOPY WITH PROPOFOL N/A 10/12/2019   Procedure: COLONOSCOPY WITH PROPOFOL;  Surgeon: Jeani Hawking, MD;  Location: [facility];  Service: Endoscopy;  Laterality: N/A;   COLONOSCOPY WITH PROPOFOL N/A 12/24/2022   Procedure: COLONOSCOPY WITH PROPOFOL;  Surgeon: Jeani Hawking, MD;  Location: [facility];  Service: Gastroenterology;  Laterality: N/A;   HEMOSTASIS CLIP PLACEMENT  10/12/2019   Procedure: HEMOSTASIS CLIP PLACEMENT;  Surgeon: Jeani Hawking, MD;  Location: [facility];  Service: Endoscopy;;   JOINT REPLACEMENT     OPEN REDUCTION LE FORT I FRACTURE  1980s   PARTIAL KNEE ARTHROPLASTY Left 01/17/2017   Procedure: UNICOMPARTMENTAL LEFT KNEE;  Surgeon: Durene Romans, MD;  Location: [facility];  Service: Orthopedics;  Laterality: Left;   POLYPECTOMY  10/12/2019   Procedure:  POLYPECTOMY;  Surgeon: Jeani Hawking, MD;  Location: [facility];  Service: Endoscopy;;   RADIOLOGY WITH ANESTHESIA N/A 08/11/2017   Procedure: MRI OF BRAIN WITH AND WITHOUT CONSTRAST, AND OF THE ORBIT WITH AND WITHOUT CONTRAST;  Surgeon: Radiologist, Medication, MD;  Location: [facility];  Service: Radiology;  Laterality: N/A;   RADIOLOGY WITH ANESTHESIA N/A 09/06/2017   Procedure: MRI OF BRAIN;  Surgeon: Radiologist, Medication, MD;  Location: [facility];  Service: Radiology;  Laterality: N/A;   RADIOLOGY WITH ANESTHESIA Right 03/03/2023   Procedure: MRI SHOULDER WITHOUT CONTRAST WITH ANESTHESIA;  Surgeon: Radiologist, Medication, MD;  Location: [facility];  Service: Radiology;  Laterality: Right;   RHINOPLASTY     SHOULDER ARTHROSCOPY WITH ROTATOR CUFF REPAIR Right 04/25/2023   Procedure: RIGHT SHOULDER ARTHROSCOPY WITH ROTATOR CUFF REPAIR, ACROMIOPLASTY;  Surgeon: Huel Cote, MD;  Location: [facility];  Service: Orthopedics;  Laterality: Right;   TOTAL KNEE ARTHROPLASTY Right 09/30/2015   Procedure: TOTAL RIGHT KNEE ARTHROPLASTY;  Surgeon: Durene Romans, MD;  Location: [facility];  Service: Orthopedics;  Laterality: Right;   VAGINAL HYSTERECTOMY     Patient Active Problem List   Diagnosis Date Noted   UTI (urinary tract infection) 10/07/2018   Sepsis (HCC) 10/07/2018   Hypokalemia 10/07/2018   S/P total thyroidectomy 10/07/2018   Rheumatoid arthritis (HCC) 09/18/2018   Papillary thyroid carcinoma (HCC)  08/24/2018   TIA (transient ischemic attack) 02/14/2018   Hyperlipidemia    Ischemic optic neuropathy of right eye    Stroke (HCC) 09/04/2017   HTN (hypertension) 09/04/2017   DM (diabetes mellitus) (HCC) 09/04/2017   Polymyalgia rheumatica (HCC) 09/04/2017   Fibromyalgia 09/04/2017   S/P left UKR 01/17/2017   Overweight (BMI 25.0-29.9) 10/01/2015   S/P right TKA 09/30/2015    PCP: Irena Reichmann DO  REFERRING PROVIDER: Huel Cote, MD   REFERRING DIAG: [phone] (ICD-10-CM) - Traumatic complete  tear of right rotator cuff, initial encounter  THERAPY DIAG:  Right shoulder pain, unspecified chronicity  Muscle weakness (generalized)  Other symptoms and signs involving the musculoskeletal system  Rationale for Evaluation and Treatment: Rehabilitation  ONSET DATE: DOS 04/25/23  R RCR  SUBJECTIVE:                                                                                                                                                                                      SUBJECTIVE STATEMENT: Pt is 1 weeks and 3 days s/p R shoulder arthroscopic rotator cuff repair, extensive debridement, and subacromial decompression.  Pt denies any adverse effects after prior Rx.  Pt reports her pain has not been bad.    Hand dominance: Right  PERTINENT HISTORY: Hx cancer, fibromyalgia, HTN, HLD, RA, Hx CVA/TIA, DM  PAIN:  Are you having pain? Yes: NPRS scale: 4-5/10 Pain location: R shoulder Pain description: sharp, ache Aggravating factors: accidental movement Relieving factors: rest  PRECAUTIONS: Shoulder R RCR  RED FLAGS: None   WEIGHT BEARING RESTRICTIONS:  NWB RUE  FALLS:  Has patient fallen in last 6 months? No   PLOF: Independent  PATIENT GOALS:get this arm working again    OBJECTIVE: (objective measures from initial evaluation unless otherwise dated) Observation: sutures intact, healing portals     TODAY'S TREATMENT:                                                                                                                                           Pendulums  cw, ccw, and f/b 2x10 each Elbow flex/ext AAROM 2x10 seated Wrist AROM flex/ext 2x10 reps each seated with arm supported Towel gripping 2x10 Pt received R shoulder PROM in flexion and ER per pt and tissue tolerance w/n protocol ranges   PT changed her dressings.  Removed old gauze and tegaderm and applied new gauze and tegaderm over incision/portals.  PT educated pt concerning dressings.     PATIENT EDUCATION:  Education details:  POC, dressings, exercise form, HEP, and precautions/protocol. Person educated: Patient Education method: Explanation, Demonstration Education comprehension: verbalized understanding, returned demonstration  HOME EXERCISE PROGRAM: Access Code: [email or web address] URL: [email or web address]  Date: 04/27/2023 - Circular Shoulder Pendulum with Table Support  - 5 x daily - 7 x weekly - 3 reps - 30 second hold - Seated Elbow Flexion AAROM  - 5 x daily - 7 x weekly - 2 sets - 10 reps - Wrist Flexion Extension AROM - Palms Down  - 5 x daily - 7 x weekly - 2 sets - 10 reps - Seated Gripping Towel  - 5 x daily - 7 x weekly - 2 sets - 10 reps  ASSESSMENT:  CLINICAL IMPRESSION: Pt performed home exercises well without c/o's.  PT performed PROM w/n protocol ranges per pt and tissue tolerance.  She tolerated PROM well and has good PROM at this time in protocol.  PT changed her dressings today.  Stitches were present in incision/portals and pt had no signs of infection.  Pt responded well to Rx having no c/o's and no increased pain after Rx.  Pt should benefit from cont skilled PT services per protocol to improve ROM and address goals and impairments.   OBJECTIVE IMPAIRMENTS: decreased activity tolerance, decreased endurance, decreased mobility, decreased ROM, decreased strength, impaired flexibility, impaired UE functional use, improper body mechanics, postural dysfunction, and pain.   ACTIVITY LIMITATIONS: carrying, lifting, bending, bed mobility, bathing, dressing, self feeding, reach over head, hygiene/grooming, locomotion level, and caring for others  PARTICIPATION LIMITATIONS: meal prep, cleaning, laundry, driving, shopping, community activity, and yard work  PERSONAL FACTORS: 3+ comorbidities: Hx cancer, fibromyalgia, HTN, HLD, RA, Hx CVA/TIA, DM  are also affecting patient's functional outcome.   REHAB POTENTIAL: Good  CLINICAL DECISION MAKING:  Stable/uncomplicated  EVALUATION COMPLEXITY: Low   GOALS: Goals reviewed with patient? Yes  SHORT TERM GOALS: Target date: 05/25/2023    Patient will be independent with HEP in order to improve functional outcomes. Baseline: Goal status: INITIAL  2.  Patient will report at least 25% improvement in symptoms for improved quality of life. Baseline:  Goal status: INITIAL  3.  Patient will tolerate beginning to wean from sling with MD approval for improved functional use of RUE.  Baseline:  Goal status: INITIAL    LONG TERM GOALS: Target date: 07/20/2023    Patient will report at least 75% improvement in symptoms for improved quality of life. Baseline:  Goal status: INITIAL  2.  Patient will improve FOTO score by at least 32 points in order to indicate improved tolerance to activity. Baseline: 29% function Goal status: INITIAL  3.  Patient will demonstrate at least 150 degrees in shoulder AROM in flexion for improved ability lift overhead. Baseline:  Goal status: INITIAL  4.  Patient will demonstrate grade of at least 4+/5 MMT grade in all tested musculature as evidence of improved strength to assist with lifting at home. Baseline:  Goal status: INITIAL  5.  Patient will be progressing with resisted strengthening in order to build strength for  chores and exercise.  Baseline:  Goal status: INITIAL    PLAN:  PT FREQUENCY: 1-2x/week  PT DURATION: 12 weeks  PLANNED INTERVENTIONS: Therapeutic exercises, Therapeutic activity, Neuromuscular re-education, Balance training, Gait training, Patient/Family education, Joint manipulation, Joint mobilization, Stair training, Orthotic/Fit training, DME instructions, Aquatic Therapy, Dry Needling, Electrical stimulation, Spinal manipulation, Spinal mobilization, Cryotherapy, Moist heat, Compression bandaging, scar mobilization, Splintting, Taping, Traction, Ultrasound, Ionotophoresis 4mg /ml Dexamethasone, and Manual  therapy   PLAN FOR NEXT SESSION: Continue to progress per Dr. Serena Croissant RCR protocol   Audie Clear III PT, DPT 05/06/23 9:21 PM

## 2023-05-06 ENCOUNTER — Encounter (HOSPITAL_BASED_OUTPATIENT_CLINIC_OR_DEPARTMENT_OTHER): Payer: Self-pay | Admitting: Physical Therapy

## 2023-05-06 DIAGNOSIS — B370 Candidal stomatitis: Secondary | ICD-10-CM | POA: Diagnosis not present

## 2023-05-08 NOTE — Therapy (Signed)
OUTPATIENT PHYSICAL THERAPY SHOULDER TREATMENT   Patient Name: Misty Morris MRN: [id number] DOB:1951-01-07, 72 y.o., female Today's Date: 05/09/2023  END OF SESSION:  PT End of Session - 05/09/23 1155     Visit Number 5    Number of Visits 24    Date for PT Re-Evaluation 07/20/23    Authorization Type [employer]    PT Start Time 1148    PT Stop Time 1224    PT Time Calculation (min) 36 min    Activity Tolerance Patient tolerated treatment well    Behavior During Therapy WFL for tasks assessed/performed                 Past Medical History:  Diagnosis Date   Cancer (HCC)    hx of thyroid cancer   Fibromyalgia    Hypercholesterolemia    Hypertension    Hypothyroidism    thyroid removed   Polymyalgia rheumatica (HCC)    Rheumatoid arthritis (HCC)    "atypical/MD 08/2017" (02/14/2018)   Shingles    Stroke (HCC) 08/2017   "no residual" (02/14/2018)   TIA (transient ischemic attack) 02/14/2018   Type 2 diabetes, diet controlled (HCC)    Vision impairment    right eye   Past Surgical History:  Procedure Laterality Date   CATARACT EXTRACTION W/ INTRAOCULAR LENS  IMPLANT, BILATERAL     COLONOSCOPY WITH PROPOFOL N/A 10/12/2019   Procedure: COLONOSCOPY WITH PROPOFOL;  Surgeon: Jeani Hawking, MD;  Location: [facility];  Service: Endoscopy;  Laterality: N/A;   COLONOSCOPY WITH PROPOFOL N/A 12/24/2022   Procedure: COLONOSCOPY WITH PROPOFOL;  Surgeon: Jeani Hawking, MD;  Location: [facility];  Service: Gastroenterology;  Laterality: N/A;   HEMOSTASIS CLIP PLACEMENT  10/12/2019   Procedure: HEMOSTASIS CLIP PLACEMENT;  Surgeon: Jeani Hawking, MD;  Location: [facility];  Service: Endoscopy;;   JOINT REPLACEMENT     OPEN REDUCTION LE FORT I FRACTURE  1980s   PARTIAL KNEE ARTHROPLASTY Left 01/17/2017   Procedure: UNICOMPARTMENTAL LEFT KNEE;  Surgeon: Durene Romans, MD;  Location: [facility];  Service: Orthopedics;  Laterality: Left;   POLYPECTOMY  10/12/2019   Procedure:  POLYPECTOMY;  Surgeon: Jeani Hawking, MD;  Location: [facility];  Service: Endoscopy;;   RADIOLOGY WITH ANESTHESIA N/A 08/11/2017   Procedure: MRI OF BRAIN WITH AND WITHOUT CONSTRAST, AND OF THE ORBIT WITH AND WITHOUT CONTRAST;  Surgeon: Radiologist, Medication, MD;  Location: [facility];  Service: Radiology;  Laterality: N/A;   RADIOLOGY WITH ANESTHESIA N/A 09/06/2017   Procedure: MRI OF BRAIN;  Surgeon: Radiologist, Medication, MD;  Location: [facility];  Service: Radiology;  Laterality: N/A;   RADIOLOGY WITH ANESTHESIA Right 03/03/2023   Procedure: MRI SHOULDER WITHOUT CONTRAST WITH ANESTHESIA;  Surgeon: Radiologist, Medication, MD;  Location: [facility];  Service: Radiology;  Laterality: Right;   RHINOPLASTY     SHOULDER ARTHROSCOPY WITH ROTATOR CUFF REPAIR Right 04/25/2023   Procedure: RIGHT SHOULDER ARTHROSCOPY WITH ROTATOR CUFF REPAIR, ACROMIOPLASTY;  Surgeon: Huel Cote, MD;  Location: [facility];  Service: Orthopedics;  Laterality: Right;   TOTAL KNEE ARTHROPLASTY Right 09/30/2015   Procedure: TOTAL RIGHT KNEE ARTHROPLASTY;  Surgeon: Durene Romans, MD;  Location: [facility];  Service: Orthopedics;  Laterality: Right;   VAGINAL HYSTERECTOMY     Patient Active Problem List   Diagnosis Date Noted   UTI (urinary tract infection) 10/07/2018   Sepsis (HCC) 10/07/2018   Hypokalemia 10/07/2018   S/P total thyroidectomy 10/07/2018   Rheumatoid arthritis (HCC) 09/18/2018   Papillary thyroid carcinoma (  HCC) 08/24/2018   TIA (transient ischemic attack) 02/14/2018   Hyperlipidemia    Ischemic optic neuropathy of right eye    Stroke (HCC) 09/04/2017   HTN (hypertension) 09/04/2017   DM (diabetes mellitus) (HCC) 09/04/2017   Polymyalgia rheumatica (HCC) 09/04/2017   Fibromyalgia 09/04/2017   S/P left UKR 01/17/2017   Overweight (BMI 25.0-29.9) 10/01/2015   S/P right TKA 09/30/2015    PCP: Irena Reichmann DO  REFERRING PROVIDER: Huel Cote, MD   REFERRING DIAG: [phone] (ICD-10-CM) - Traumatic complete  tear of right rotator cuff, initial encounter  THERAPY DIAG:  Right shoulder pain, unspecified chronicity  Muscle weakness (generalized)  Other symptoms and signs involving the musculoskeletal system  Rationale for Evaluation and Treatment: Rehabilitation  ONSET DATE: DOS 04/25/23  R RCR  SUBJECTIVE:                                                                                                                                                                                      SUBJECTIVE STATEMENT: Pt is 2 weeks s/p R shoulder arthroscopic rotator cuff repair, extensive debridement, and subacromial decompression.  Pt saw MD this AM and he removed her bandage and stitches.  Pt states MD informed her she would need the sling and abd pillow for 4 more weeks.  Pt denies any adverse effects after prior Rx.    Hand dominance: Right  PERTINENT HISTORY: Hx cancer, fibromyalgia, HTN, HLD, RA, Hx CVA/TIA, DM  PAIN:  Are you having pain? Yes: NPRS scale: 4-5/10 Pain location: R shoulder Pain description: sharp, ache Aggravating factors: accidental movement Relieving factors: rest  PRECAUTIONS: Shoulder R RCR  RED FLAGS: None   WEIGHT BEARING RESTRICTIONS:  NWB RUE  FALLS:  Has patient fallen in last 6 months? No   PLOF: Independent  PATIENT GOALS:get this arm working again    OBJECTIVE:  Observation:  pt has steri strips over portals/incision     TODAY'S TREATMENT:  Pendulums cw, ccw, and f/b 2x10 each Elbow flex/ext AAROM 2x10 seated Scap retraction 2x10 seated Wrist AROM flex/ext 2x10 reps each seated with arm supported Towel gripping 2x10 Pt received R shoulder PROM in flexion, abd, and ER per pt and tissue tolerance w/n protocol ranges   R shoulder PROM: flexion: 123, ER:  25 deg    PATIENT EDUCATION:  Education details:   POC, dressings, exercise form, HEP, and precautions/protocol. Person educated: Patient Education method: Explanation, Demonstration Education comprehension: verbalized understanding, returned demonstration  HOME EXERCISE PROGRAM: Access Code: [email or web address] URL: [email or web address]  Date: 04/27/2023 - Circular Shoulder Pendulum with Table Support  - 5 x daily - 7 x weekly - 3 reps - 30 second hold - Seated Elbow Flexion AAROM  - 5 x daily - 7 x weekly - 2 sets - 10 reps - Wrist Flexion Extension AROM - Palms Down  - 5 x daily - 7 x weekly - 2 sets - 10 reps - Seated Gripping Towel  - 5 x daily - 7 x weekly - 2 sets - 10 reps  ASSESSMENT:  CLINICAL IMPRESSION: PT performed PROM w/n protocol ranges per pt and tissue tolerance.  Pt tolerated PROM well.  She did have some pain initially with flexion PROM which improved with limiting ROM and increased repetitions.  PT assessed shoulder PROM and she is progressing well with PROM at this time in protocol.  Pt performed exercises per protocol well without c/o's.  She responded well to Rx having no increased pain after Rx.  Pt should benefit from cont skilled PT services per protocol to improve ROM and address goals and impairments.   OBJECTIVE IMPAIRMENTS: decreased activity tolerance, decreased endurance, decreased mobility, decreased ROM, decreased strength, impaired flexibility, impaired UE functional use, improper body mechanics, postural dysfunction, and pain.   ACTIVITY LIMITATIONS: carrying, lifting, bending, bed mobility, bathing, dressing, self feeding, reach over head, hygiene/grooming, locomotion level, and caring for others  PARTICIPATION LIMITATIONS: meal prep, cleaning, laundry, driving, shopping, community activity, and yard work  PERSONAL FACTORS: 3+ comorbidities: Hx cancer, fibromyalgia, HTN, HLD, RA, Hx CVA/TIA, DM  are also affecting patient's functional outcome.   REHAB POTENTIAL: Good  CLINICAL DECISION MAKING:  Stable/uncomplicated  EVALUATION COMPLEXITY: Low   GOALS: Goals reviewed with patient? Yes  SHORT TERM GOALS: Target date: 05/25/2023    Patient will be independent with HEP in order to improve functional outcomes. Baseline: Goal status: INITIAL  2.  Patient will report at least 25% improvement in symptoms for improved quality of life. Baseline:  Goal status: INITIAL  3.  Patient will tolerate beginning to wean from sling with MD approval for improved functional use of RUE.  Baseline:  Goal status: INITIAL    LONG TERM GOALS: Target date: 07/20/2023    Patient will report at least 75% improvement in symptoms for improved quality of life. Baseline:  Goal status: INITIAL  2.  Patient will improve FOTO score by at least 32 points in order to indicate improved tolerance to activity. Baseline: 29% function Goal status: INITIAL  3.  Patient will demonstrate at least 150 degrees in shoulder AROM in flexion for improved ability lift overhead. Baseline:  Goal status: INITIAL  4.  Patient will demonstrate grade of at least 4+/5 MMT grade in all tested musculature as evidence of improved strength to assist with lifting at home. Baseline:  Goal status: INITIAL  5.  Patient will be progressing with resisted strengthening in order to build strength for chores and exercise.  Baseline:  Goal status: INITIAL    PLAN:  PT FREQUENCY: 1-2x/week  PT DURATION: 12 weeks  PLANNED INTERVENTIONS: Therapeutic exercises, Therapeutic activity, Neuromuscular re-education, Balance training, Gait training, Patient/Family education, Joint manipulation, Joint mobilization, Stair training, Orthotic/Fit training, DME instructions, Aquatic Therapy, Dry Needling, Electrical stimulation, Spinal manipulation, Spinal mobilization, Cryotherapy, Moist heat, Compression bandaging, scar mobilization, Splintting, Taping, Traction, Ultrasound, Ionotophoresis 4mg /ml Dexamethasone, and Manual  therapy   PLAN FOR NEXT SESSION: Continue to progress per Dr. Serena Croissant RCR protocol   Audie Clear III PT, DPT 05/09/23 10:26 PM

## 2023-05-09 ENCOUNTER — Ambulatory Visit (HOSPITAL_BASED_OUTPATIENT_CLINIC_OR_DEPARTMENT_OTHER): Payer: PPO | Admitting: Physical Therapy

## 2023-05-09 ENCOUNTER — Encounter (HOSPITAL_BASED_OUTPATIENT_CLINIC_OR_DEPARTMENT_OTHER): Payer: Self-pay | Admitting: Physical Therapy

## 2023-05-09 ENCOUNTER — Ambulatory Visit (INDEPENDENT_AMBULATORY_CARE_PROVIDER_SITE_OTHER): Payer: PPO | Admitting: Orthopaedic Surgery

## 2023-05-09 DIAGNOSIS — M6281 Muscle weakness (generalized): Secondary | ICD-10-CM

## 2023-05-09 DIAGNOSIS — S46011A Strain of muscle(s) and tendon(s) of the rotator cuff of right shoulder, initial encounter: Secondary | ICD-10-CM

## 2023-05-09 DIAGNOSIS — M25511 Pain in right shoulder: Secondary | ICD-10-CM

## 2023-05-09 DIAGNOSIS — R29898 Other symptoms and signs involving the musculoskeletal system: Secondary | ICD-10-CM

## 2023-05-09 NOTE — Progress Notes (Signed)
Post Operative Evaluation    Procedure/Date of Surgery: Right shoulder rotator cuff repair 9/16  Interval History:   Presents today 2 weeks status post right shoulder rotator cuff repair.  She is completely compliant with sling usage.  She has begun physical therapy.  Overall she is doing very well.  Has very minimal pain   PMH/PSH/Family History/Social History/Meds/Allergies:    Past Medical History:  Diagnosis Date   Cancer (HCC)    hx of thyroid cancer   Fibromyalgia    Hypercholesterolemia    Hypertension    Hypothyroidism    thyroid removed   Polymyalgia rheumatica (HCC)    Rheumatoid arthritis (HCC)    "atypical/MD 08/2017" (02/14/2018)   Shingles    Stroke (HCC) 08/2017   "no residual" (02/14/2018)   TIA (transient ischemic attack) 02/14/2018   Type 2 diabetes, diet controlled (HCC)    Vision impairment    right eye   Past Surgical History:  Procedure Laterality Date   CATARACT EXTRACTION W/ INTRAOCULAR LENS  IMPLANT, BILATERAL     COLONOSCOPY WITH PROPOFOL N/A 10/12/2019   Procedure: COLONOSCOPY WITH PROPOFOL;  Surgeon: Jeani Hawking, MD;  Location: [facility];  Service: Endoscopy;  Laterality: N/A;   COLONOSCOPY WITH PROPOFOL N/A 12/24/2022   Procedure: COLONOSCOPY WITH PROPOFOL;  Surgeon: Jeani Hawking, MD;  Location: [facility];  Service: Gastroenterology;  Laterality: N/A;   HEMOSTASIS CLIP PLACEMENT  10/12/2019   Procedure: HEMOSTASIS CLIP PLACEMENT;  Surgeon: Jeani Hawking, MD;  Location: [facility];  Service: Endoscopy;;   JOINT REPLACEMENT     OPEN REDUCTION LE FORT I FRACTURE  1980s   PARTIAL KNEE ARTHROPLASTY Left 01/17/2017   Procedure: UNICOMPARTMENTAL LEFT KNEE;  Surgeon: Durene Romans, MD;  Location: [facility];  Service: Orthopedics;  Laterality: Left;   POLYPECTOMY  10/12/2019   Procedure: POLYPECTOMY;  Surgeon: Jeani Hawking, MD;  Location: [facility];  Service: Endoscopy;;   RADIOLOGY WITH ANESTHESIA N/A 08/11/2017    Procedure: MRI OF BRAIN WITH AND WITHOUT CONSTRAST, AND OF THE ORBIT WITH AND WITHOUT CONTRAST;  Surgeon: Radiologist, Medication, MD;  Location: [facility];  Service: Radiology;  Laterality: N/A;   RADIOLOGY WITH ANESTHESIA N/A 09/06/2017   Procedure: MRI OF BRAIN;  Surgeon: Radiologist, Medication, MD;  Location: [facility];  Service: Radiology;  Laterality: N/A;   RADIOLOGY WITH ANESTHESIA Right 03/03/2023   Procedure: MRI SHOULDER WITHOUT CONTRAST WITH ANESTHESIA;  Surgeon: Radiologist, Medication, MD;  Location: [facility];  Service: Radiology;  Laterality: Right;   RHINOPLASTY     SHOULDER ARTHROSCOPY WITH ROTATOR CUFF REPAIR Right 04/25/2023   Procedure: RIGHT SHOULDER ARTHROSCOPY WITH ROTATOR CUFF REPAIR, ACROMIOPLASTY;  Surgeon: Huel Cote, MD;  Location: [facility];  Service: Orthopedics;  Laterality: Right;   TOTAL KNEE ARTHROPLASTY Right 09/30/2015   Procedure: TOTAL RIGHT KNEE ARTHROPLASTY;  Surgeon: Durene Romans, MD;  Location: [facility];  Service: Orthopedics;  Laterality: Right;   VAGINAL HYSTERECTOMY     Social History   Socioeconomic History   Marital status: Single    Spouse name: Not on file   Number of children: 0   Years of education: Not on file   Highest education level: Not on file  Occupational History   Not on file  Tobacco Use   Smoking status: Former    Current packs/day: 0.00  Average packs/day: 1 pack/day for 25.0 years (25.0 ttl pk-yrs)    Types: Cigarettes    Start date: 12/27/1986    Quit date: 12/27/2011    Years since quitting: 11.3   Smokeless tobacco: Never  Vaping Use   Vaping status: Never Used  Substance and Sexual Activity   Alcohol use: Never   Drug use: Yes    Types: Marijuana    Comment: last smoked 03/28/23   Sexual activity: Not Currently  Other Topics Concern   Not on file  Social History Narrative   Not on file   Social Determinants of Health   Financial Resource Strain: Not on file  Food Insecurity: Not on file  Transportation Needs: Not on  file  Physical Activity: Not on file  Stress: Not on file  Social Connections: Not on file   Family History  Problem Relation Age of Onset   Hypertension Mother    Seizures Mother    Hypertension Father    Hypertension Sister    Pancreatic cancer Other    Allergies  Allergen Reactions   Codeine Hives   Demerol [Meperidine] Hives   Latex Dermatitis and Other (See Comments)    Blistering  Other Reaction(s): Unknown   Morphine And Codeine Hives   Sulfa Antibiotics Hives   Tramadol Hives   Vicodin [Hydrocodone-Acetaminophen] Itching    Elevated blood pressure. Tolerates low doses.   Adalimumab     Other Reaction(s): hair loss   Current Outpatient Medications  Medication Sig Dispense Refill   ACETYLCARN-ALPHA LIPOIC ACID PO Take 1 capsule by mouth 2 (two) times daily.     amLODipine (NORVASC) 5 MG tablet Take 5 mg by mouth every evening.   2   Calcium-Magnesium-Vitamin D (CALCIUM 1200+D3 PO) Take 1 tablet by mouth every evening.     Cholecalciferol (VITAMIN D) 50 MCG (2000 UT) tablet Take 2,000 Units by mouth daily.     Chromium Picolinate 200 MCG CAPS Take 200 mcg by mouth daily.     clopidogrel (PLAVIX) 75 MG tablet Take 1 tablet (75 mg total) by mouth daily. 30 tablet 3   Coenzyme Q10 (CO Q-10) 300 MG CAPS Take 300 mg by mouth every evening.     diphenhydrAMINE (BENADRYL) 25 MG tablet Take 25 mg by mouth at bedtime.     estradiol (ESTRACE) 0.1 MG/GM vaginal cream Place 1 Applicatorful vaginally 2 (two) times a week.     HYDROmorphone (DILAUDID) 2 MG tablet Take 0.5 tablets (1 mg total) by mouth every 4 (four) hours as needed for severe pain. 5 tablet 0   ibuprofen (ADVIL) 200 MG tablet Take 400 mg by mouth every 6 (six) hours as needed for moderate pain or headache (about 4 or 5 time a week).     levothyroxine (SYNTHROID) 88 MCG tablet Take 88 mcg by mouth daily before breakfast.     Lysine 600 MG TABS Take 600 mg by mouth every evening.     moxifloxacin (VIGAMOX) 0.5 %  ophthalmic solution Place 1 drop into the left eye daily.     Omega-3 Fatty Acids (FISH OIL PO) Take 1,400 mg by mouth daily.     oxyCODONE (ROXICODONE) 5 MG immediate release tablet Take 1 tablet (5 mg total) by mouth every 4 (four) hours as needed for severe pain or breakthrough pain. 30 tablet 0   Polyvinyl Alcohol-Povidone (REFRESH OP) Place 1 drop into the left eye See admin instructions. Middle of the night 0300 as needed  predniSONE (DELTASONE) 1 MG tablet Take 4 mg by mouth every evening.     rosuvastatin (CRESTOR) 20 MG tablet Take 1 tablet (20 mg total) by mouth daily at 6 PM. 30 tablet 3   timolol (TIMOPTIC) 0.25 % ophthalmic solution Place 1 drop into both eyes daily.     ZIOPTAN 0.0015 % SOLN Place 1 drop into both eyes at bedtime.  3   zoledronic acid (RECLAST) 5 MG/100ML SOLN injection Inject 5 mg into the vein once. 02/22/23 Yearly     No current facility-administered medications for this visit.   No results found.  Review of Systems:   A ROS was performed including pertinent positives and negatives as documented in the HPI.   Musculoskeletal Exam:    There were no vitals taken for this visit.  Right shoulder incisions are well-appearing without erythema or drainage.  In the supine position stable to forward elevate to 90 degrees with external rotation of 30 degrees.  This is done passively.  Internal rotation deferred today.  Distal neurosensory exam is intact  Imaging:      I personally reviewed and interpreted the radiographs.   Assessment:   2-week status post right shoulder rotator cuff repair doing very well today's visit.  I will plan to see her back in 4 weeks for reassessment  Plan :    -Return to clinic 4 weeks for reassessment      I personally saw and evaluated the patient, and participated in the management and treatment plan.  Huel Cote, MD Attending Physician, Orthopedic Surgery  This document was dictated using Dragon voice  recognition software. A reasonable attempt at proof reading has been made to minimize errors.

## 2023-05-11 ENCOUNTER — Ambulatory Visit (HOSPITAL_BASED_OUTPATIENT_CLINIC_OR_DEPARTMENT_OTHER): Payer: PPO | Attending: Orthopaedic Surgery | Admitting: Physical Therapy

## 2023-05-11 DIAGNOSIS — M6281 Muscle weakness (generalized): Secondary | ICD-10-CM | POA: Insufficient documentation

## 2023-05-11 DIAGNOSIS — R29898 Other symptoms and signs involving the musculoskeletal system: Secondary | ICD-10-CM | POA: Diagnosis not present

## 2023-05-11 DIAGNOSIS — M25511 Pain in right shoulder: Secondary | ICD-10-CM | POA: Insufficient documentation

## 2023-05-11 NOTE — Therapy (Signed)
OUTPATIENT PHYSICAL THERAPY SHOULDER TREATMENT   Patient Name: Misty Morris MRN: [id number] DOB:12-04-1950, 72 y.o., female Today's Date: 05/12/2023  END OF SESSION:  PT End of Session - 05/11/23       Visit Number 6     Number of Visits 24     Date for PT Re-Evaluation 07/20/23     Authorization Type [employer]     PT Start Time 1159     PT Stop Time 1232    PT Time Calculation (min) 33 min     Activity Tolerance Patient tolerated treatment well     Behavior During Therapy WFL for tasks assessed/performed        Past Medical History:  Diagnosis Date   Cancer (HCC)    hx of thyroid cancer   Fibromyalgia    Hypercholesterolemia    Hypertension    Hypothyroidism    thyroid removed   Polymyalgia rheumatica (HCC)    Rheumatoid arthritis (HCC)    "atypical/MD 08/2017" (02/14/2018)   Shingles    Stroke (HCC) 08/2017   "no residual" (02/14/2018)   TIA (transient ischemic attack) 02/14/2018   Type 2 diabetes, diet controlled (HCC)    Vision impairment    right eye   Past Surgical History:  Procedure Laterality Date   CATARACT EXTRACTION W/ INTRAOCULAR LENS  IMPLANT, BILATERAL     COLONOSCOPY WITH PROPOFOL N/A 10/12/2019   Procedure: COLONOSCOPY WITH PROPOFOL;  Surgeon: Jeani Hawking, MD;  Location: [facility];  Service: Endoscopy;  Laterality: N/A;   COLONOSCOPY WITH PROPOFOL N/A 12/24/2022   Procedure: COLONOSCOPY WITH PROPOFOL;  Surgeon: Jeani Hawking, MD;  Location: [facility];  Service: Gastroenterology;  Laterality: N/A;   HEMOSTASIS CLIP PLACEMENT  10/12/2019   Procedure: HEMOSTASIS CLIP PLACEMENT;  Surgeon: Jeani Hawking, MD;  Location: [facility];  Service: Endoscopy;;   JOINT REPLACEMENT     OPEN REDUCTION LE FORT I FRACTURE  1980s   PARTIAL KNEE ARTHROPLASTY Left 01/17/2017   Procedure: UNICOMPARTMENTAL LEFT KNEE;  Surgeon: Durene Romans, MD;  Location: [facility];  Service: Orthopedics;  Laterality: Left;   POLYPECTOMY  10/12/2019   Procedure:  POLYPECTOMY;  Surgeon: Jeani Hawking, MD;  Location: [facility];  Service: Endoscopy;;   RADIOLOGY WITH ANESTHESIA N/A 08/11/2017   Procedure: MRI OF BRAIN WITH AND WITHOUT CONSTRAST, AND OF THE ORBIT WITH AND WITHOUT CONTRAST;  Surgeon: Radiologist, Medication, MD;  Location: [facility];  Service: Radiology;  Laterality: N/A;   RADIOLOGY WITH ANESTHESIA N/A 09/06/2017   Procedure: MRI OF BRAIN;  Surgeon: Radiologist, Medication, MD;  Location: [facility];  Service: Radiology;  Laterality: N/A;   RADIOLOGY WITH ANESTHESIA Right 03/03/2023   Procedure: MRI SHOULDER WITHOUT CONTRAST WITH ANESTHESIA;  Surgeon: Radiologist, Medication, MD;  Location: [facility];  Service: Radiology;  Laterality: Right;   RHINOPLASTY     SHOULDER ARTHROSCOPY WITH ROTATOR CUFF REPAIR Right 04/25/2023   Procedure: RIGHT SHOULDER ARTHROSCOPY WITH ROTATOR CUFF REPAIR, ACROMIOPLASTY;  Surgeon: Huel Cote, MD;  Location: [facility];  Service: Orthopedics;  Laterality: Right;   TOTAL KNEE ARTHROPLASTY Right 09/30/2015   Procedure: TOTAL RIGHT KNEE ARTHROPLASTY;  Surgeon: Durene Romans, MD;  Location: [facility];  Service: Orthopedics;  Laterality: Right;   VAGINAL HYSTERECTOMY     Patient Active Problem List   Diagnosis Date Noted   UTI (urinary tract infection) 10/07/2018   Sepsis (HCC) 10/07/2018   Hypokalemia 10/07/2018   S/P total thyroidectomy 10/07/2018   Rheumatoid arthritis (HCC) 09/18/2018   Papillary thyroid carcinoma (HCC)  08/24/2018   TIA (transient ischemic attack) 02/14/2018   Hyperlipidemia    Ischemic optic neuropathy of right eye    Stroke (HCC) 09/04/2017   HTN (hypertension) 09/04/2017   DM (diabetes mellitus) (HCC) 09/04/2017   Polymyalgia rheumatica (HCC) 09/04/2017   Fibromyalgia 09/04/2017   S/P left UKR 01/17/2017   Overweight (BMI 25.0-29.9) 10/01/2015   S/P right TKA 09/30/2015    PCP: Irena Reichmann DO  REFERRING PROVIDER: Huel Cote, MD   REFERRING DIAG: [phone] (ICD-10-CM) - Traumatic complete  tear of right rotator cuff, initial encounter  THERAPY DIAG:  Right shoulder pain, unspecified chronicity  Muscle weakness (generalized)  Other symptoms and signs involving the musculoskeletal system  Rationale for Evaluation and Treatment: Rehabilitation  ONSET DATE: DOS 04/25/23  R RCR  SUBJECTIVE:                                                                                                                                                                                      SUBJECTIVE STATEMENT: Pt is 2 weeks and 2 days s/p R shoulder arthroscopic rotator cuff repair, extensive debridement, and subacromial decompression.  Pt denies any adverse effects after prior Rx.  Pt states she is hurting more this AM.  Pt states she is hurting in her arm, not as much in her shoulder.   Hand dominance: Right  PERTINENT HISTORY: Hx cancer, fibromyalgia, HTN, HLD, RA, Hx CVA/TIA, DM  PAIN:  Are you having pain? Yes: NPRS scale: 6/10 Pain location: R proximal UE > shoulder Pain description: sharp, ache Aggravating factors: accidental movement Relieving factors: rest  PRECAUTIONS: Shoulder R RCR  RED FLAGS: None   WEIGHT BEARING RESTRICTIONS:  NWB RUE  FALLS:  Has patient fallen in last 6 months? No   PLOF: Independent  PATIENT GOALS:get this arm working again    OBJECTIVE:  Observation:  pt has steri strips over portals/incision     TODAY'S TREATMENT:  Pendulums cw, ccw, and f/b 2x10 each Elbow flex/ext AAROM 2x10 seated Scap retraction 2x10 seated Wrist AROM flex/ext 2x10 reps each seated with arm supported Pt received R shoulder PROM in flexion, abd, and ER per pt and tissue tolerance w/n protocol ranges   R shoulder PROM: ER:  33 deg    PATIENT EDUCATION:  Education details:  POC, ROM findings, exercise form, HEP, and  precautions/protocol. Person educated: Patient Education method: Explanation, Demonstration Education comprehension: verbalized understanding, returned demonstration  HOME EXERCISE PROGRAM: Access Code: [email or web address] URL: [email or web address]  Date: 04/27/2023 - Circular Shoulder Pendulum with Table Support  - 5 x daily - 7 x weekly - 3 reps - 30 second hold - Seated Elbow Flexion AAROM  - 5 x daily - 7 x weekly - 2 sets - 10 reps - Wrist Flexion Extension AROM - Palms Down  - 5 x daily - 7 x weekly - 2 sets - 10 reps - Seated Gripping Towel  - 5 x daily - 7 x weekly - 2 sets - 10 reps  ASSESSMENT:  CLINICAL IMPRESSION: PT performed PROM w/n protocol ranges per pt and tissue tolerance.  Pt tolerated PROM well.  She demonstrates improved ER PROM as evidenced by goniometric measurements.  She demonstrates improved flexion ROM with increased reps of PROM.  She responded well to Rx having no increased pain after Rx and stated her shoulder feels better.  Pt should benefit from cont skilled PT services per protocol to improve ROM and address goals and impairments.    OBJECTIVE IMPAIRMENTS: decreased activity tolerance, decreased endurance, decreased mobility, decreased ROM, decreased strength, impaired flexibility, impaired UE functional use, improper body mechanics, postural dysfunction, and pain.   ACTIVITY LIMITATIONS: carrying, lifting, bending, bed mobility, bathing, dressing, self feeding, reach over head, hygiene/grooming, locomotion level, and caring for others  PARTICIPATION LIMITATIONS: meal prep, cleaning, laundry, driving, shopping, community activity, and yard work  PERSONAL FACTORS: 3+ comorbidities: Hx cancer, fibromyalgia, HTN, HLD, RA, Hx CVA/TIA, DM  are also affecting patient's functional outcome.   REHAB POTENTIAL: Good  CLINICAL DECISION MAKING: Stable/uncomplicated  EVALUATION COMPLEXITY: Low   GOALS: Goals reviewed with patient? Yes  SHORT TERM GOALS:  Target date: 05/25/2023    Patient will be independent with HEP in order to improve functional outcomes. Baseline: Goal status: INITIAL  2.  Patient will report at least 25% improvement in symptoms for improved quality of life. Baseline:  Goal status: INITIAL  3.  Patient will tolerate beginning to wean from sling with MD approval for improved functional use of RUE.  Baseline:  Goal status: INITIAL    LONG TERM GOALS: Target date: 07/20/2023    Patient will report at least 75% improvement in symptoms for improved quality of life. Baseline:  Goal status: INITIAL  2.  Patient will improve FOTO score by at least 32 points in order to indicate improved tolerance to activity. Baseline: 29% function Goal status: INITIAL  3.  Patient will demonstrate at least 150 degrees in shoulder AROM in flexion for improved ability lift overhead. Baseline:  Goal status: INITIAL  4.  Patient will demonstrate grade of at least 4+/5 MMT grade in all tested musculature as evidence of improved strength to assist with lifting at home. Baseline:  Goal status: INITIAL  5.  Patient will be progressing with resisted strengthening in order to build strength for chores and exercise.  Baseline:  Goal status: INITIAL    PLAN:  PT FREQUENCY: 1-2x/week  PT DURATION: 12 weeks  PLANNED INTERVENTIONS: Therapeutic exercises, Therapeutic activity, Neuromuscular re-education, Balance training, Gait training, Patient/Family education, Joint manipulation, Joint mobilization, Stair training, Orthotic/Fit training, DME instructions, Aquatic Therapy, Dry Needling, Electrical stimulation, Spinal manipulation, Spinal mobilization, Cryotherapy, Moist heat, Compression bandaging, scar mobilization, Splintting, Taping, Traction, Ultrasound, Ionotophoresis 4mg /ml Dexamethasone, and Manual therapy   PLAN FOR NEXT SESSION: Continue to progress per Dr. Serena Croissant RCR protocol   Audie Clear III PT, DPT 05/12/23 10:25  PM

## 2023-05-12 ENCOUNTER — Encounter (HOSPITAL_BASED_OUTPATIENT_CLINIC_OR_DEPARTMENT_OTHER): Payer: Self-pay | Admitting: Physical Therapy

## 2023-05-16 ENCOUNTER — Other Ambulatory Visit (HOSPITAL_BASED_OUTPATIENT_CLINIC_OR_DEPARTMENT_OTHER): Payer: Self-pay

## 2023-05-16 ENCOUNTER — Ambulatory Visit (HOSPITAL_BASED_OUTPATIENT_CLINIC_OR_DEPARTMENT_OTHER): Payer: PPO | Admitting: Physical Therapy

## 2023-05-16 ENCOUNTER — Encounter (HOSPITAL_BASED_OUTPATIENT_CLINIC_OR_DEPARTMENT_OTHER): Payer: Self-pay | Admitting: Physical Therapy

## 2023-05-16 DIAGNOSIS — M25511 Pain in right shoulder: Secondary | ICD-10-CM

## 2023-05-16 DIAGNOSIS — M6281 Muscle weakness (generalized): Secondary | ICD-10-CM

## 2023-05-16 MED ORDER — INFLUENZA VAC A&B SURF ANT ADJ 0.5 ML IM SUSY
0.5000 mL | PREFILLED_SYRINGE | Freq: Once | INTRAMUSCULAR | 0 refills | Status: AC
  Filled 2023-05-16: qty 0.5, 1d supply, fill #0

## 2023-05-16 NOTE — Therapy (Signed)
OUTPATIENT PHYSICAL THERAPY SHOULDER TREATMENT   Patient Name: Misty Morris MRN: [id number] DOB:1951-03-12, 72 y.o., female Today's Date: 05/16/2023  END OF SESSION:  PT End of Session - 05/16/23 1136     Visit Number 7    Number of Visits 24    Date for PT Re-Evaluation 07/20/23    Authorization Type [employer]    PT Start Time 1136    PT Stop Time 1206    PT Time Calculation (min) 30 min    Activity Tolerance Patient tolerated treatment well    Behavior During Therapy [facility] for tasks assessed/performed                Past Medical History:  Diagnosis Date   Cancer (HCC)    hx of thyroid cancer   Fibromyalgia    Hypercholesterolemia    Hypertension    Hypothyroidism    thyroid removed   Polymyalgia rheumatica (HCC)    Rheumatoid arthritis (HCC)    "atypical/MD 08/2017" (02/14/2018)   Shingles    Stroke (HCC) 08/2017   "no residual" (02/14/2018)   TIA (transient ischemic attack) 02/14/2018   Type 2 diabetes, diet controlled (HCC)    Vision impairment    right eye   Past Surgical History:  Procedure Laterality Date   CATARACT EXTRACTION W/ INTRAOCULAR LENS  IMPLANT, BILATERAL     COLONOSCOPY WITH PROPOFOL N/A 10/12/2019   Procedure: COLONOSCOPY WITH PROPOFOL;  Surgeon: Jeani Hawking, MD;  Location: [facility];  Service: Endoscopy;  Laterality: N/A;   COLONOSCOPY WITH PROPOFOL N/A 12/24/2022   Procedure: COLONOSCOPY WITH PROPOFOL;  Surgeon: Jeani Hawking, MD;  Location: [facility];  Service: Gastroenterology;  Laterality: N/A;   HEMOSTASIS CLIP PLACEMENT  10/12/2019   Procedure: HEMOSTASIS CLIP PLACEMENT;  Surgeon: Jeani Hawking, MD;  Location: [facility];  Service: Endoscopy;;   JOINT REPLACEMENT     OPEN REDUCTION LE FORT I FRACTURE  1980s   PARTIAL KNEE ARTHROPLASTY Left 01/17/2017   Procedure: UNICOMPARTMENTAL LEFT KNEE;  Surgeon: Durene Romans, MD;  Location: [facility];  Service: Orthopedics;  Laterality: Left;   POLYPECTOMY  10/12/2019   Procedure:  POLYPECTOMY;  Surgeon: Jeani Hawking, MD;  Location: [facility];  Service: Endoscopy;;   RADIOLOGY WITH ANESTHESIA N/A 08/11/2017   Procedure: MRI OF BRAIN WITH AND WITHOUT CONSTRAST, AND OF THE ORBIT WITH AND WITHOUT CONTRAST;  Surgeon: Radiologist, Medication, MD;  [facility];  Service: Radiology;  Laterality: N/A;   RADIOLOGY WITH ANESTHESIA N/A 09/06/2017   Procedure: MRI OF BRAIN;  Surgeon: Radiologist, Medication, MD;  [facility];  Service: Radiology;  Laterality: N/A;   RADIOLOGY WITH ANESTHESIA Right 03/03/2023   Procedure: MRI SHOULDER WITHOUT CONTRAST WITH ANESTHESIA;  Surgeon: Radiologist, Medication, MD;  Location: [facility];  Service: Radiology;  Laterality: Right;   RHINOPLASTY     SHOULDER ARTHROSCOPY WITH ROTATOR CUFF REPAIR Right 04/25/2023   Procedure: RIGHT SHOULDER ARTHROSCOPY WITH ROTATOR CUFF REPAIR, ACROMIOPLASTY;  Surgeon: Huel Cote, MD;  Location: [facility];  Service: Orthopedics;  Laterality: Right;   TOTAL KNEE ARTHROPLASTY Right 09/30/2015   Procedure: TOTAL RIGHT KNEE ARTHROPLASTY;  Surgeon: Durene Romans, MD;  Location: [facility];  Service: Orthopedics;  Laterality: Right;   VAGINAL HYSTERECTOMY     Patient Active Problem List   Diagnosis Date Noted   UTI (urinary tract infection) 10/07/2018   Sepsis (HCC) 10/07/2018   Hypokalemia 10/07/2018   S/P total thyroidectomy 10/07/2018   Rheumatoid arthritis (HCC) 09/18/2018   Papillary thyroid carcinoma (HCC)  08/24/2018   TIA (transient ischemic attack) 02/14/2018   Hyperlipidemia    Ischemic optic neuropathy of right eye    Stroke (HCC) 09/04/2017   HTN (hypertension) 09/04/2017   DM (diabetes mellitus) (HCC) 09/04/2017   Polymyalgia rheumatica (HCC) 09/04/2017   Fibromyalgia 09/04/2017   S/P left UKR 01/17/2017   Overweight (BMI 25.0-29.9) 10/01/2015   S/P right TKA 09/30/2015    PCP: Irena Reichmann DO  REFERRING PROVIDER: Huel Cote, MD   REFERRING DIAG: [phone] (ICD-10-CM) - Traumatic complete  tear of right rotator cuff, initial encounter  THERAPY DIAG:  Right shoulder pain, unspecified chronicity  Muscle weakness (generalized)  Rationale for Evaluation and Treatment: Rehabilitation  ONSET DATE: DOS 04/25/23  R RCR  SUBJECTIVE:                                                                                                                                                                                      SUBJECTIVE STATEMENT: Pt is 3 weeks s/p R shoulder arthroscopic rotator cuff repair, extensive debridement, and subacromial decompression.   I do my exercises 4x/day. I just know it's there at rest. Sleeping well once I am situated- last night it took me over an hour.   Hand dominance: Right  PERTINENT HISTORY: Hx cancer, fibromyalgia, HTN, HLD, RA, Hx CVA/TIA, DM  PAIN:  Are you having pain? Yes: NPRS scale: minimal/10 Pain location: R proximal UE > shoulder Pain description: sharp, ache Aggravating factors: accidental movement Relieving factors: rest  PRECAUTIONS: Shoulder R RCR  RED FLAGS: None   WEIGHT BEARING RESTRICTIONS:  NWB RUE  FALLS:  Has patient fallen in last 6 months? No   PLOF: Independent  PATIENT GOALS:get this arm working again    OBJECTIVE:  Observation:  pt has steri strips over portals/incision     TODAY'S TREATMENT:                                                                                                                                          Treatment  10/7:  PROM within protocol Grade 1 & 2 AP mobs at neutral STM upper trap & levator scap Elbow ROM Altered sling fit Discussed resting positions and healing progression     PATIENT EDUCATION:  Education details:  POC, ROM findings, exercise form, HEP, and precautions/protocol. Person educated: Patient Education method: Explanation, Demonstration Education comprehension: verbalized understanding, returned demonstration  HOME  EXERCISE PROGRAM: Access Code: [email or web address] URL: [email or web address]   ASSESSMENT:  CLINICAL IMPRESSION: Excellent progression noted. Flexion to 120 without limitation or reported end feel. Pt reprots she is being careful with her arm and advised her to remove sling from her neck when she is at rest.    OBJECTIVE IMPAIRMENTS: decreased activity tolerance, decreased endurance, decreased mobility, decreased ROM, decreased strength, impaired flexibility, impaired UE functional use, improper body mechanics, postural dysfunction, and pain.   ACTIVITY LIMITATIONS: carrying, lifting, bending, bed mobility, bathing, dressing, self feeding, reach over head, hygiene/grooming, locomotion level, and caring for others  PARTICIPATION LIMITATIONS: meal prep, cleaning, laundry, driving, shopping, community activity, and yard work  PERSONAL FACTORS: 3+ comorbidities: Hx cancer, fibromyalgia, HTN, HLD, RA, Hx CVA/TIA, DM  are also affecting patient's functional outcome.   REHAB POTENTIAL: Good  CLINICAL DECISION MAKING: Stable/uncomplicated  EVALUATION COMPLEXITY: Low   GOALS: Goals reviewed with patient? Yes  SHORT TERM GOALS: Target date: 05/25/2023    Patient will be independent with HEP in order to improve functional outcomes. Baseline: Goal status: INITIAL  2.  Patient will report at least 25% improvement in symptoms for improved quality of life. Baseline:  Goal status: INITIAL  3.  Patient will tolerate beginning to wean from sling with MD approval for improved functional use of RUE.  Baseline:  Goal status: INITIAL    LONG TERM GOALS: Target date: 07/20/2023    Patient will report at least 75% improvement in symptoms for improved quality of life. Baseline:  Goal status: INITIAL  2.  Patient will improve FOTO score by at least 32 points in order to indicate improved tolerance to activity. Baseline: 29% function Goal status: INITIAL  3.  Patient will  demonstrate at least 150 degrees in shoulder AROM in flexion for improved ability lift overhead. Baseline:  Goal status: INITIAL  4.  Patient will demonstrate grade of at least 4+/5 MMT grade in all tested musculature as evidence of improved strength to assist with lifting at home. Baseline:  Goal status: INITIAL  5.  Patient will be progressing with resisted strengthening in order to build strength for chores and exercise.  Baseline:  Goal status: INITIAL    PLAN:  PT FREQUENCY: 1-2x/week  PT DURATION: 12 weeks  PLANNED INTERVENTIONS: Therapeutic exercises, Therapeutic activity, Neuromuscular re-education, Balance training, Gait training, Patient/Family education, Joint manipulation, Joint mobilization, Stair training, Orthotic/Fit training, DME instructions, Aquatic Therapy, Dry Needling, Electrical stimulation, Spinal manipulation, Spinal mobilization, Cryotherapy, Moist heat, Compression bandaging, scar mobilization, Splintting, Taping, Traction, Ultrasound, Ionotophoresis 4mg /ml Dexamethasone, and Manual therapy   PLAN FOR NEXT SESSION: Continue to progress per Dr. Serena Croissant RCR protocol   Gwenlyn Found. Nakeisha Greenhouse PT, DPT 05/16/23 12:15 PM

## 2023-05-18 ENCOUNTER — Ambulatory Visit (HOSPITAL_BASED_OUTPATIENT_CLINIC_OR_DEPARTMENT_OTHER): Payer: PPO | Admitting: Physical Therapy

## 2023-05-18 DIAGNOSIS — M25511 Pain in right shoulder: Secondary | ICD-10-CM | POA: Diagnosis not present

## 2023-05-18 DIAGNOSIS — M6281 Muscle weakness (generalized): Secondary | ICD-10-CM

## 2023-05-18 NOTE — Therapy (Signed)
OUTPATIENT PHYSICAL THERAPY SHOULDER TREATMENT   Patient Name: Misty Morris MRN: [id number] DOB:03-07-1951, 72 y.o., female Today's Date: 05/20/2023  END OF SESSION:  PT End of Session - 05/18/23        Visit Number 8     Number of Visits 24     Date for PT Re-Evaluation 07/20/23     Authorization Type [employer]     PT Start Time 1155     PT Stop Time 1228     PT Time Calculation (min) 33 min     Activity Tolerance Patient tolerated treatment well     Behavior During Therapy WFL for tasks assessed/performed         Past Medical History:  Diagnosis Date   Cancer (HCC)    hx of thyroid cancer   Fibromyalgia    Hypercholesterolemia    Hypertension    Hypothyroidism    thyroid removed   Polymyalgia rheumatica (HCC)    Rheumatoid arthritis (HCC)    "atypical/MD 08/2017" (02/14/2018)   Shingles    Stroke (HCC) 08/2017   "no residual" (02/14/2018)   TIA (transient ischemic attack) 02/14/2018   Type 2 diabetes, diet controlled (HCC)    Vision impairment    right eye   Past Surgical History:  Procedure Laterality Date   CATARACT EXTRACTION W/ INTRAOCULAR LENS  IMPLANT, BILATERAL     COLONOSCOPY WITH PROPOFOL N/A 10/12/2019   Procedure: COLONOSCOPY WITH PROPOFOL;  Surgeon: Jeani Hawking, MD;  Location: [facility];  Service: Endoscopy;  Laterality: N/A;   COLONOSCOPY WITH PROPOFOL N/A 12/24/2022   Procedure: COLONOSCOPY WITH PROPOFOL;  Surgeon: Jeani Hawking, MD;  Location: [facility];  Service: Gastroenterology;  Laterality: N/A;   HEMOSTASIS CLIP PLACEMENT  10/12/2019   Procedure: HEMOSTASIS CLIP PLACEMENT;  Surgeon: Jeani Hawking, MD;  Location: [facility];  Service: Endoscopy;;   JOINT REPLACEMENT     OPEN REDUCTION LE FORT I FRACTURE  1980s   PARTIAL KNEE ARTHROPLASTY Left 01/17/2017   Procedure: UNICOMPARTMENTAL LEFT KNEE;  Surgeon: Durene Romans, MD;  Location: [facility];  Service: Orthopedics;  Laterality: Left;   POLYPECTOMY  10/12/2019   Procedure:  POLYPECTOMY;  Surgeon: Jeani Hawking, MD;  Location: [facility];  Service: Endoscopy;;   RADIOLOGY WITH ANESTHESIA N/A 08/11/2017   Procedure: MRI OF BRAIN WITH AND WITHOUT CONSTRAST, AND OF THE ORBIT WITH AND WITHOUT CONTRAST;  Surgeon: Radiologist, Medication, MD;  Location: [facility];  Service: Radiology;  Laterality: N/A;   RADIOLOGY WITH ANESTHESIA N/A 09/06/2017   Procedure: MRI OF BRAIN;  Surgeon: Radiologist, Medication, MD;  Location: [facility];  Service: Radiology;  Laterality: N/A;   RADIOLOGY WITH ANESTHESIA Right 03/03/2023   Procedure: MRI SHOULDER WITHOUT CONTRAST WITH ANESTHESIA;  Surgeon: Radiologist, Medication, MD;  Location: [facility];  Service: Radiology;  Laterality: Right;   RHINOPLASTY     SHOULDER ARTHROSCOPY WITH ROTATOR CUFF REPAIR Right 04/25/2023   Procedure: RIGHT SHOULDER ARTHROSCOPY WITH ROTATOR CUFF REPAIR, ACROMIOPLASTY;  Surgeon: Huel Cote, MD;  Location: [facility];  Service: Orthopedics;  Laterality: Right;   TOTAL KNEE ARTHROPLASTY Right 09/30/2015   Procedure: TOTAL RIGHT KNEE ARTHROPLASTY;  Surgeon: Durene Romans, MD;  Location: [facility];  Service: Orthopedics;  Laterality: Right;   VAGINAL HYSTERECTOMY     Patient Active Problem List   Diagnosis Date Noted   UTI (urinary tract infection) 10/07/2018   Sepsis (HCC) 10/07/2018   Hypokalemia 10/07/2018   S/P total thyroidectomy 10/07/2018   Rheumatoid arthritis (HCC) 09/18/2018   Papillary  thyroid carcinoma (HCC) 08/24/2018   TIA (transient ischemic attack) 02/14/2018   Hyperlipidemia    Ischemic optic neuropathy of right eye    Stroke (HCC) 09/04/2017   HTN (hypertension) 09/04/2017   DM (diabetes mellitus) (HCC) 09/04/2017   Polymyalgia rheumatica (HCC) 09/04/2017   Fibromyalgia 09/04/2017   S/P left UKR 01/17/2017   Overweight (BMI 25.0-29.9) 10/01/2015   S/P right TKA 09/30/2015    PCP: Irena Reichmann DO  REFERRING PROVIDER: Huel Cote, MD   REFERRING DIAG: [phone] (ICD-10-CM) - Traumatic complete  tear of right rotator cuff, initial encounter  THERAPY DIAG:  Right shoulder pain, unspecified chronicity  Muscle weakness (generalized)  Rationale for Evaluation and Treatment: Rehabilitation  ONSET DATE: DOS 04/25/23  R RCR  SUBJECTIVE:                                                                                                                                                                                      SUBJECTIVE STATEMENT: Pt is 3 weeks and 2 days s/p R shoulder arthroscopic rotator cuff repair, extensive debridement, and subacromial decompression.   "It's not painful right now."  Pt denies any adverse effects after prior Rx.  Pt states she has been doing her exercises 4x/day.   Hand dominance: Right  PERTINENT HISTORY: Hx cancer, fibromyalgia, HTN, HLD, RA, Hx CVA/TIA, DM  PAIN:  Are you having pain? Yes: NPRS scale:  /10 Pain location: R proximal UE > shoulder Pain description: sharp, ache Aggravating factors: accidental movement Relieving factors: rest  PRECAUTIONS: Shoulder R RCR  RED FLAGS: None   WEIGHT BEARING RESTRICTIONS:  NWB RUE  FALLS:  Has patient fallen in last 6 months? No   PLOF: Independent  PATIENT GOALS:get this arm working again    OBJECTIVE:  Observation:  pt has steri strips over portals/incision     TODAY'S TREATMENT:  Pendulums cw, ccw, and f/b 2x10 each Pt received R shoulder PROM in flexion, abd, ER, and IR per pt and tissue tolerance w/n protocol ranges Pt received R elbow flexion/extension PROM in supine.    R shoulder PROM: Flexion:  131, ER:  37 deg     PATIENT EDUCATION:  Education details:  POC, ROM findings, exercise form, HEP, and precautions/protocol. Person educated: Patient Education method: Explanation, Demonstration Education comprehension: verbalized  understanding, returned demonstration  HOME EXERCISE PROGRAM: Access Code: [email or web address] URL: [email or web address]   ASSESSMENT:  CLINICAL IMPRESSION: Pt is making great progress with shoulder PROM.  She demonstrates improved flexion and ER PROM as evidenced by goniometric measurements.  PT performed PROM w/n protocol ranges per pt and tissue tolerance. Pt tolerated PROM well.  She is progressing appropriately with ROM per protocol.  Pt responded well to Rx having no c/o's after Rx.  She should benefit from cont skilled PT services per protocol for improved ROM and function and to address ongoing goals.    OBJECTIVE IMPAIRMENTS: decreased activity tolerance, decreased endurance, decreased mobility, decreased ROM, decreased strength, impaired flexibility, impaired UE functional use, improper body mechanics, postural dysfunction, and pain.   ACTIVITY LIMITATIONS: carrying, lifting, bending, bed mobility, bathing, dressing, self feeding, reach over head, hygiene/grooming, locomotion level, and caring for others  PARTICIPATION LIMITATIONS: meal prep, cleaning, laundry, driving, shopping, community activity, and yard work  PERSONAL FACTORS: 3+ comorbidities: Hx cancer, fibromyalgia, HTN, HLD, RA, Hx CVA/TIA, DM  are also affecting patient's functional outcome.   REHAB POTENTIAL: Good  CLINICAL DECISION MAKING: Stable/uncomplicated  EVALUATION COMPLEXITY: Low   GOALS: Goals reviewed with patient? Yes  SHORT TERM GOALS: Target date: 05/25/2023    Patient will be independent with HEP in order to improve functional outcomes. Baseline: Goal status: INITIAL  2.  Patient will report at least 25% improvement in symptoms for improved quality of life. Baseline:  Goal status: INITIAL  3.  Patient will tolerate beginning to wean from sling with MD approval for improved functional use of RUE.  Baseline:  Goal status: INITIAL    LONG TERM GOALS: Target date: 07/20/2023     Patient will report at least 75% improvement in symptoms for improved quality of life. Baseline:  Goal status: INITIAL  2.  Patient will improve FOTO score by at least 32 points in order to indicate improved tolerance to activity. Baseline: 29% function Goal status: INITIAL  3.  Patient will demonstrate at least 150 degrees in shoulder AROM in flexion for improved ability lift overhead. Baseline:  Goal status: INITIAL  4.  Patient will demonstrate grade of at least 4+/5 MMT grade in all tested musculature as evidence of improved strength to assist with lifting at home. Baseline:  Goal status: INITIAL  5.  Patient will be progressing with resisted strengthening in order to build strength for chores and exercise.  Baseline:  Goal status: INITIAL    PLAN:  PT FREQUENCY: 1-2x/week  PT DURATION: 12 weeks  PLANNED INTERVENTIONS: Therapeutic exercises, Therapeutic activity, Neuromuscular re-education, Balance training, Gait training, Patient/Family education, Joint manipulation, Joint mobilization, Stair training, Orthotic/Fit training, DME instructions, Aquatic Therapy, Dry Needling, Electrical stimulation, Spinal manipulation, Spinal mobilization, Cryotherapy, Moist heat, Compression bandaging, scar mobilization, Splintting, Taping, Traction, Ultrasound, Ionotophoresis 4mg /ml Dexamethasone, and Manual therapy   PLAN FOR NEXT SESSION: Continue to progress per Dr. Serena Croissant RCR protocol   Audie Clear III PT, DPT 05/20/23 12:22 PM

## 2023-05-20 ENCOUNTER — Encounter (HOSPITAL_BASED_OUTPATIENT_CLINIC_OR_DEPARTMENT_OTHER): Payer: Self-pay | Admitting: Physical Therapy

## 2023-05-23 ENCOUNTER — Encounter (HOSPITAL_BASED_OUTPATIENT_CLINIC_OR_DEPARTMENT_OTHER): Payer: Self-pay | Admitting: Physical Therapy

## 2023-05-23 ENCOUNTER — Ambulatory Visit (HOSPITAL_BASED_OUTPATIENT_CLINIC_OR_DEPARTMENT_OTHER): Payer: PPO | Admitting: Physical Therapy

## 2023-05-23 DIAGNOSIS — M6281 Muscle weakness (generalized): Secondary | ICD-10-CM

## 2023-05-23 DIAGNOSIS — M25511 Pain in right shoulder: Secondary | ICD-10-CM

## 2023-05-23 NOTE — Therapy (Signed)
OUTPATIENT PHYSICAL THERAPY SHOULDER TREATMENT   Patient Name: Misty Morris MRN: [id number] DOB:03/21/1951, 72 y.o., female Today's Date: 05/23/2023  END OF SESSION:  PT End of Session - 05/23/23 1401     Visit Number 9    Number of Visits 24    Date for PT Re-Evaluation 07/20/23    Authorization Type [employer]    Progress Note Due on Visit 10    PT Start Time 1400    PT Stop Time 1427    PT Time Calculation (min) 27 min    Activity Tolerance Patient tolerated treatment well    Behavior During Therapy [facility] for tasks assessed/performed            PT End of Session - 05/18/23        Visit Number 8     Number of Visits 24     Date for PT Re-Evaluation 07/20/23     Authorization Type [employer]     PT Start Time 1155     PT Stop Time 1228     PT Time Calculation (min) 33 min     Activity Tolerance Patient tolerated treatment well     Behavior During Therapy WFL for tasks assessed/performed         Past Medical History:  Diagnosis Date   Cancer (HCC)    hx of thyroid cancer   Fibromyalgia    Hypercholesterolemia    Hypertension    Hypothyroidism    thyroid removed   Polymyalgia rheumatica (HCC)    Rheumatoid arthritis (HCC)    "atypical/MD 08/2017" (02/14/2018)   Shingles    Stroke (HCC) 08/2017   "no residual" (02/14/2018)   TIA (transient ischemic attack) 02/14/2018   Type 2 diabetes, diet controlled (HCC)    Vision impairment    right eye   Past Surgical History:  Procedure Laterality Date   CATARACT EXTRACTION W/ INTRAOCULAR LENS  IMPLANT, BILATERAL     COLONOSCOPY WITH PROPOFOL N/A 10/12/2019   Procedure: COLONOSCOPY WITH PROPOFOL;  Surgeon: Jeani Hawking, MD;  Location: [facility];  Service: Endoscopy;  Laterality: N/A;   COLONOSCOPY WITH PROPOFOL N/A 12/24/2022   Procedure: COLONOSCOPY WITH PROPOFOL;  Surgeon: Jeani Hawking, MD;  Location: [facility];  Service: Gastroenterology;  Laterality: N/A;   HEMOSTASIS CLIP PLACEMENT   10/12/2019   Procedure: HEMOSTASIS CLIP PLACEMENT;  Surgeon: Jeani Hawking, MD;  Location: [facility];  Service: Endoscopy;;   JOINT REPLACEMENT     OPEN REDUCTION LE FORT I FRACTURE  1980s   PARTIAL KNEE ARTHROPLASTY Left 01/17/2017   Procedure: UNICOMPARTMENTAL LEFT KNEE;  Surgeon: Durene Romans, MD;  Location: [facility];  Service: Orthopedics;  Laterality: Left;   POLYPECTOMY  10/12/2019   Procedure: POLYPECTOMY;  Surgeon: Jeani Hawking, MD;  Location: [facility];  Service: Endoscopy;;   RADIOLOGY WITH ANESTHESIA N/A 08/11/2017   Procedure: MRI OF BRAIN WITH AND WITHOUT CONSTRAST, AND OF THE ORBIT WITH AND WITHOUT CONTRAST;  Surgeon: Radiologist, Medication, MD;  Location: [facility];  Service: Radiology;  Laterality: N/A;   RADIOLOGY WITH ANESTHESIA N/A 09/06/2017   Procedure: MRI OF BRAIN;  Surgeon: Radiologist, Medication, MD;  [facility]: [facility];  Service: Radiology;  Laterality: N/A;   RADIOLOGY WITH ANESTHESIA Right 03/03/2023   Procedure: MRI SHOULDER WITHOUT CONTRAST WITH ANESTHESIA;  Surgeon: Radiologist, Medication, MD;  Location: [facility];  Service: Radiology;  Laterality: Right;   RHINOPLASTY     SHOULDER ARTHROSCOPY WITH ROTATOR CUFF REPAIR Right 04/25/2023   Procedure: RIGHT SHOULDER  ARTHROSCOPY WITH ROTATOR CUFF REPAIR, ACROMIOPLASTY;  Surgeon: Huel Cote, MD;  Location: [facility];  Service: Orthopedics;  Laterality: Right;   TOTAL KNEE ARTHROPLASTY Right 09/30/2015   Procedure: TOTAL RIGHT KNEE ARTHROPLASTY;  Surgeon: Durene Romans, MD;  Location: [facility];  Service: Orthopedics;  Laterality: Right;   VAGINAL HYSTERECTOMY     Patient Active Problem List   Diagnosis Date Noted   UTI (urinary tract infection) 10/07/2018   Sepsis (HCC) 10/07/2018   Hypokalemia 10/07/2018   S/P total thyroidectomy 10/07/2018   Rheumatoid arthritis (HCC) 09/18/2018   Papillary thyroid carcinoma (HCC) 08/24/2018   TIA (transient ischemic attack) 02/14/2018   Hyperlipidemia    Ischemic optic neuropathy of right  eye    Stroke (HCC) 09/04/2017   HTN (hypertension) 09/04/2017   DM (diabetes mellitus) (HCC) 09/04/2017   Polymyalgia rheumatica (HCC) 09/04/2017   Fibromyalgia 09/04/2017   S/P left UKR 01/17/2017   Overweight (BMI 25.0-29.9) 10/01/2015   S/P right TKA 09/30/2015    PCP: Irena Reichmann DO  REFERRING PROVIDER: Huel Cote, MD   REFERRING DIAG: [phone] (ICD-10-CM) - Traumatic complete tear of right rotator cuff, initial encounter  THERAPY DIAG:  Right shoulder pain, unspecified chronicity  Muscle weakness (generalized)  Rationale for Evaluation and Treatment: Rehabilitation  ONSET DATE: DOS 04/25/23  R RCR  SUBJECTIVE:                                                                                                                                                                                      SUBJECTIVE STATEMENT: Pt is 4 wk s/p R shoulder arthroscopic rotator cuff repair, extensive debridement, and subacromial decompression.   The only hard thing right now is getting comfortable to sleep.   Hand dominance: Right  PERTINENT HISTORY: Hx cancer, fibromyalgia, HTN, HLD, RA, Hx CVA/TIA, DM  PAIN:  Are you having pain? Yes: NPRS scale:  /10 Pain location: R proximal UE > shoulder Pain description: sharp, ache Aggravating factors: accidental movement Relieving factors: rest  PRECAUTIONS: Shoulder R RCR  RED FLAGS: None   WEIGHT BEARING RESTRICTIONS:  NWB RUE  FALLS:  Has patient fallen in last 6 months? No   PLOF: Independent  PATIENT GOALS:get this arm working again    OBJECTIVE:  Observation:  pt has steri strips over portals/incision     TODAY'S TREATMENT:  Treatment                            05/23/23:  PROM per protocol- all goals achieved without pain.  STM upper trap & levator AP GHJ mobs grade  3     PATIENT EDUCATION:  Education details:  POC, ROM findings, exercise form, HEP, and precautions/protocol. Person educated: Patient Education method: Explanation, Demonstration Education comprehension: verbalized understanding, returned demonstration  HOME EXERCISE PROGRAM: Access Code: [email or web address] URL: [email or web address]   ASSESSMENT:  CLINICAL IMPRESSION:  10th visit progress note to be completed at next visit. Meeting all ROM goals and no pain. Encouraged regular motion through cervical region and continue removing sling at rest.    OBJECTIVE IMPAIRMENTS: decreased activity tolerance, decreased endurance, decreased mobility, decreased ROM, decreased strength, impaired flexibility, impaired UE functional use, improper body mechanics, postural dysfunction, and pain.   ACTIVITY LIMITATIONS: carrying, lifting, bending, bed mobility, bathing, dressing, self feeding, reach over head, hygiene/grooming, locomotion level, and caring for others  PARTICIPATION LIMITATIONS: meal prep, cleaning, laundry, driving, shopping, community activity, and yard work  PERSONAL FACTORS: 3+ comorbidities: Hx cancer, fibromyalgia, HTN, HLD, RA, Hx CVA/TIA, DM  are also affecting patient's functional outcome.   REHAB POTENTIAL: Good  CLINICAL DECISION MAKING: Stable/uncomplicated  EVALUATION COMPLEXITY: Low   GOALS: Goals reviewed with patient? Yes  SHORT TERM GOALS: Target date: 05/25/2023    Patient will be independent with HEP in order to improve functional outcomes. Baseline: Goal status: INITIAL  2.  Patient will report at least 25% improvement in symptoms for improved quality of life. Baseline:  Goal status: INITIAL  3.  Patient will tolerate beginning to wean from sling with MD approval for improved functional use of RUE.  Baseline:  Goal status: INITIAL    LONG TERM GOALS: Target date: 07/20/2023    Patient will report at least 75% improvement in symptoms  for improved quality of life. Baseline:  Goal status: INITIAL  2.  Patient will improve FOTO score by at least 32 points in order to indicate improved tolerance to activity. Baseline: 29% function Goal status: INITIAL  3.  Patient will demonstrate at least 150 degrees in shoulder AROM in flexion for improved ability lift overhead. Baseline:  Goal status: INITIAL  4.  Patient will demonstrate grade of at least 4+/5 MMT grade in all tested musculature as evidence of improved strength to assist with lifting at home. Baseline:  Goal status: INITIAL  5.  Patient will be progressing with resisted strengthening in order to build strength for chores and exercise.  Baseline:  Goal status: INITIAL    PLAN:  PT FREQUENCY: 1-2x/week  PT DURATION: 12 weeks  PLANNED INTERVENTIONS: Therapeutic exercises, Therapeutic activity, Neuromuscular re-education, Balance training, Gait training, Patient/Family education, Joint manipulation, Joint mobilization, Stair training, Orthotic/Fit training, DME instructions, Aquatic Therapy, Dry Needling, Electrical stimulation, Spinal manipulation, Spinal mobilization, Cryotherapy, Moist heat, Compression bandaging, scar mobilization, Splintting, Taping, Traction, Ultrasound, Ionotophoresis 4mg /ml Dexamethasone, and Manual therapy   PLAN FOR NEXT SESSION: Continue to progress per Dr. Serena Croissant RCR protocol   Gwenlyn Found. Parminder Trapani PT, DPT 05/23/23 2:28 PM

## 2023-06-02 ENCOUNTER — Encounter (HOSPITAL_BASED_OUTPATIENT_CLINIC_OR_DEPARTMENT_OTHER): Payer: Self-pay | Admitting: Physical Therapy

## 2023-06-02 ENCOUNTER — Ambulatory Visit (HOSPITAL_BASED_OUTPATIENT_CLINIC_OR_DEPARTMENT_OTHER): Payer: PPO | Admitting: Physical Therapy

## 2023-06-02 DIAGNOSIS — M25511 Pain in right shoulder: Secondary | ICD-10-CM

## 2023-06-02 DIAGNOSIS — M6281 Muscle weakness (generalized): Secondary | ICD-10-CM

## 2023-06-02 DIAGNOSIS — R29898 Other symptoms and signs involving the musculoskeletal system: Secondary | ICD-10-CM

## 2023-06-02 NOTE — Therapy (Signed)
OUTPATIENT PHYSICAL THERAPY SHOULDER TREATMENT / PROGRESS NOTE  Progress Note Reporting Period 04/27/2023 to 06/02/2023  See note below for Objective Data and Assessment of Progress/Goals.       Patient Name: Misty Morris MRN: [id number] DOB:12/20/1950, 72 y.o., female Today's Date: 06/03/2023  END OF SESSION:  PT End of Session - 06/02/23 1455     Visit Number 10    Number of Visits 24    Date for PT Re-Evaluation 07/20/23    Authorization Type [employer]    Progress Note Due on Visit 20    PT Start Time 1452    PT Stop Time 1528    PT Time Calculation (min) 36 min    Activity Tolerance Patient tolerated treatment well    Behavior During Therapy [facility] for tasks assessed/performed                  Past Medical History:  Diagnosis Date   Cancer (HCC)    hx of thyroid cancer   Fibromyalgia    Hypercholesterolemia    Hypertension    Hypothyroidism    thyroid removed   Polymyalgia rheumatica (HCC)    Rheumatoid arthritis (HCC)    "atypical/MD 08/2017" (02/14/2018)   Shingles    Stroke (HCC) 08/2017   "no residual" (02/14/2018)   TIA (transient ischemic attack) 02/14/2018   Type 2 diabetes, diet controlled (HCC)    Vision impairment    right eye   Past Surgical History:  Procedure Laterality Date   CATARACT EXTRACTION W/ INTRAOCULAR LENS  IMPLANT, BILATERAL     COLONOSCOPY WITH PROPOFOL N/A 10/12/2019   Procedure: COLONOSCOPY WITH PROPOFOL;  Surgeon: Jeani Hawking, MD;  Location: [facility];  Service: Endoscopy;  Laterality: N/A;   COLONOSCOPY WITH PROPOFOL N/A 12/24/2022   Procedure: COLONOSCOPY WITH PROPOFOL;  Surgeon: Jeani Hawking, MD;  Location: [facility];  Service: Gastroenterology;  Laterality: N/A;   HEMOSTASIS CLIP PLACEMENT  10/12/2019   Procedure: HEMOSTASIS CLIP PLACEMENT;  Surgeon: Jeani Hawking, MD;  Location: [facility];  Service: Endoscopy;;   JOINT REPLACEMENT     OPEN REDUCTION LE FORT I FRACTURE  1980s   PARTIAL KNEE  ARTHROPLASTY Left 01/17/2017   Procedure: UNICOMPARTMENTAL LEFT KNEE;  Surgeon: Durene Romans, MD;  Location: [facility];  Service: Orthopedics;  Laterality: Left;   POLYPECTOMY  10/12/2019   Procedure: POLYPECTOMY;  Surgeon: Jeani Hawking, MD;  [facility];  Service: Endoscopy;;   RADIOLOGY WITH ANESTHESIA N/A 08/11/2017   Procedure: MRI OF BRAIN WITH AND WITHOUT CONSTRAST, AND OF THE ORBIT WITH AND WITHOUT CONTRAST;  Surgeon: Radiologist, Medication, MD;  [facility]: [facility];  Service: Radiology;  Laterality: N/A;   RADIOLOGY WITH ANESTHESIA N/A 09/06/2017   Procedure: MRI OF BRAIN;  Surgeon: Radiologist, Medication, MD;  [facility];  Service: Radiology;  Laterality: N/A;   RADIOLOGY WITH ANESTHESIA Right 03/03/2023   Procedure: MRI SHOULDER WITHOUT CONTRAST WITH ANESTHESIA;  Surgeon: Radiologist, Medication, MD;  [facility]: [facility];  Service: Radiology;  Laterality: Right;   RHINOPLASTY     SHOULDER ARTHROSCOPY WITH ROTATOR CUFF REPAIR Right 04/25/2023   Procedure: RIGHT SHOULDER ARTHROSCOPY WITH ROTATOR CUFF REPAIR, ACROMIOPLASTY;  Surgeon: Huel Cote, MD;  [facility];  Service: Orthopedics;  Laterality: Right;   TOTAL KNEE ARTHROPLASTY Right 09/30/2015   Procedure: TOTAL RIGHT KNEE ARTHROPLASTY;  Surgeon: Durene Romans, MD;  [facility]: [facility];  Service: Orthopedics;  Laterality: Right;   VAGINAL HYSTERECTOMY     Patient Active Problem List  Diagnosis Date Noted   UTI (urinary tract infection) 10/07/2018   Sepsis (HCC) 10/07/2018   Hypokalemia 10/07/2018   S/P total thyroidectomy 10/07/2018   Rheumatoid arthritis (HCC) 09/18/2018   Papillary thyroid carcinoma (HCC) 08/24/2018   TIA (transient ischemic attack) 02/14/2018   Hyperlipidemia    Ischemic optic neuropathy of right eye    Stroke (HCC) 09/04/2017   HTN (hypertension) 09/04/2017   DM (diabetes mellitus) (HCC) 09/04/2017   Polymyalgia rheumatica (HCC) 09/04/2017   Fibromyalgia 09/04/2017   S/P left UKR 01/17/2017    Overweight (BMI 25.0-29.9) 10/01/2015   S/P right TKA 09/30/2015    PCP: Irena Reichmann DO  REFERRING PROVIDER: Huel Cote, MD   REFERRING DIAG: [phone] (ICD-10-CM) - Traumatic complete tear of right rotator cuff, initial encounter  THERAPY DIAG:  Right shoulder pain, unspecified chronicity  Muscle weakness (generalized)  Other symptoms and signs involving the musculoskeletal system  Rationale for Evaluation and Treatment: Rehabilitation  ONSET DATE: DOS 04/25/23  R RCR  SUBJECTIVE:                                                                                                                                                                                      SUBJECTIVE STATEMENT: Pt is 5 weeks and 3 days s/p R shoulder arthroscopic rotator cuff repair, extensive debridement, and subacromial decompression.  Pt reports compliance with HEP.  Her pain is "negligible".  Pt reports her pain is not that bad. Pt is sling dependent and is limited with all of her ADLs/IADLs.  Pt reports dressing is easier.  She is able to eat easier.  Pt reports 30% improvement in sx's.  She reports improved sleeping.  Pt has difficulty getting comfortable to go to sleep though is sleeping fine when she gets to sleep.   Hand dominance: Right  PERTINENT HISTORY: Hx cancer, fibromyalgia, HTN, HLD, RA, Hx CVA/TIA, DM  PAIN:  1-2/10 current, 4/10 worst, 1/10 best  PRECAUTIONS: Shoulder R RCR  RED FLAGS: None   WEIGHT BEARING RESTRICTIONS:  NWB RUE  FALLS:  Has patient fallen in last 6 months? No   PLOF: Independent  PATIENT GOALS:get this arm working again    OBJECTIVE:  Observation:  pt has steri strips over portals/incision     TODAY'S TREATMENT:  Reviewed current function, HEP compliance, and pain levels.   R shoulder PROM: Flexion:  150, ER:   43 deg, IR:  44 deg   FOTO:  Initial/Current:  29 /  36.  Goal of 61 at visit 23.  Pt performed pendulums cw, ccw, and f/b 2x10 each Pt received R shoulder PROM in flexion, scaption, abd, ER, and IR per pt and tissue tolerance w/n protocol ranges    PATIENT EDUCATION:  Education details:  POC, ROM findings, exercise form, HEP, and precautions/protocol. Person educated: Patient Education method: Explanation, Demonstration Education comprehension: verbalized understanding, returned demonstration  HOME EXERCISE PROGRAM: Access Code: [email or web address] URL: [email or web address]   ASSESSMENT:  CLINICAL IMPRESSION:    Pt reports improved pain, sx's, and function.  Pt is sling dependent and is limited with all of her ADLs/IADLs.  Pt is limited with activities per protocol.  She does report dressing and eating is easier.  Pt reports 30% improvement in sx's.  She also reports improved sleeping.  Pt is making excellent progress in shoulder ROM progressing appropriately with protocol.  Pt met STG's #1,2.  She responded well to Rx having no increased pain or c/o's after Rx.  Pt should benefit from cont skilled PT services per protocol to address ongoing goals, improve function, and progress with protocol.      OBJECTIVE IMPAIRMENTS: decreased activity tolerance, decreased endurance, decreased mobility, decreased ROM, decreased strength, impaired flexibility, impaired UE functional use, improper body mechanics, postural dysfunction, and pain.   ACTIVITY LIMITATIONS: carrying, lifting, bending, bed mobility, bathing, dressing, self feeding, reach over head, hygiene/grooming, locomotion level, and caring for others  PARTICIPATION LIMITATIONS: meal prep, cleaning, laundry, driving, shopping, community activity, and yard work  PERSONAL FACTORS: 3+ comorbidities: Hx cancer, fibromyalgia, HTN, HLD, RA, Hx CVA/TIA, DM  are also affecting patient's functional outcome.   REHAB POTENTIAL:  Good  CLINICAL DECISION MAKING: Stable/uncomplicated  EVALUATION COMPLEXITY: Low   GOALS: Goals reviewed with patient? Yes  SHORT TERM GOALS: Target date: 05/25/2023    Patient will be independent with HEP in order to improve functional outcomes. Baseline: Goal status: GOAL MET  10/24  2.  Patient will report at least 25% improvement in symptoms for improved quality of life. Baseline:  Goal status: GOAL MET   10/24  3.  Patient will tolerate beginning to wean from sling with MD approval for improved functional use of RUE.  Baseline:  Goal status: INITIAL    LONG TERM GOALS: Target date: 07/20/2023    Patient will report at least 75% improvement in symptoms for improved quality of life. Baseline:  Goal status: INITIAL  2.  Patient will improve FOTO score by at least 32 points in order to indicate improved tolerance to activity. Baseline: 29% function Goal status: INITIAL  3.  Patient will demonstrate at least 150 degrees in shoulder AROM in flexion for improved ability lift overhead. Baseline:  Goal status: INITIAL  4.  Patient will demonstrate grade of at least 4+/5 MMT grade in all tested musculature as evidence of improved strength to assist with lifting at home. Baseline:  Goal status: INITIAL  5.  Patient will be progressing with resisted strengthening in order to build strength for chores and exercise.  Baseline:  Goal status: INITIAL    PLAN:  PT FREQUENCY: 2x/wk  PT DURATION: 7 weeks  PLANNED INTERVENTIONS: Therapeutic exercises, Therapeutic activity, Neuromuscular re-education, Balance training, Gait training, Patient/Family education, Joint manipulation, Joint mobilization, Stair training, Orthotic/Fit training, DME instructions, Aquatic Therapy, Dry Needling, Electrical  stimulation, Spinal manipulation, Spinal mobilization, Cryotherapy, Moist heat, Compression bandaging, scar mobilization, Splintting, Taping, Traction, Ultrasound, Ionotophoresis  4mg /ml Dexamethasone, and Manual therapy   PLAN FOR NEXT SESSION: Continue to progress per Dr. Serena Croissant RCR protocol   Audie Clear III PT, DPT 06/03/23 10:35 PM

## 2023-06-04 ENCOUNTER — Encounter (HOSPITAL_BASED_OUTPATIENT_CLINIC_OR_DEPARTMENT_OTHER): Payer: Self-pay | Admitting: Physical Therapy

## 2023-06-04 ENCOUNTER — Ambulatory Visit (HOSPITAL_BASED_OUTPATIENT_CLINIC_OR_DEPARTMENT_OTHER): Payer: PPO | Admitting: Physical Therapy

## 2023-06-04 DIAGNOSIS — M25511 Pain in right shoulder: Secondary | ICD-10-CM | POA: Diagnosis not present

## 2023-06-04 DIAGNOSIS — M6281 Muscle weakness (generalized): Secondary | ICD-10-CM

## 2023-06-04 NOTE — Therapy (Signed)
OUTPATIENT PHYSICAL THERAPY SHOULDER TREATMENT      Patient Name: Misty Morris MRN: [id number] DOB:[birth year], 72 y.o., female Today's Date: 06/04/2023  END OF SESSION:  PT End of Session - 06/04/23 1139     Visit Number 11    Number of Visits 24    Date for PT Re-Evaluation 07/20/23    Authorization Type [employer]    Progress Note Due on Visit 20    PT Start Time 1139    PT Stop Time 1215    PT Time Calculation (min) 36 min    Activity Tolerance Patient tolerated treatment well    Behavior During Therapy WFL for tasks assessed/performed                   Past Medical History:  Diagnosis Date   Cancer (HCC)    hx of thyroid cancer   Fibromyalgia    Hypercholesterolemia    Hypertension    Hypothyroidism    thyroid removed   Polymyalgia rheumatica (HCC)    Rheumatoid arthritis (HCC)    "atypical/MD 08/2017" (02/14/2018)   Shingles    Stroke (HCC) 08/2017   "no residual" (02/14/2018)   TIA (transient ischemic attack) 02/14/2018   Type 2 diabetes, diet controlled (HCC)    Vision impairment    right eye   Past Surgical History:  Procedure Laterality Date   CATARACT EXTRACTION W/ INTRAOCULAR LENS  IMPLANT, BILATERAL     COLONOSCOPY WITH PROPOFOL N/A 10/12/2019   Procedure: COLONOSCOPY WITH PROPOFOL;  Surgeon: Jeani Hawking, MD;  [facility]: [facility];  Service: Endoscopy;  Laterality: N/A;   COLONOSCOPY WITH PROPOFOL N/A 12/24/2022   Procedure: COLONOSCOPY WITH PROPOFOL;  Surgeon: Jeani Hawking, MD;  [facility]: [facility];  Service: Gastroenterology;  Laterality: N/A;   HEMOSTASIS CLIP PLACEMENT  10/12/2019   Procedure: HEMOSTASIS CLIP PLACEMENT;  Surgeon: Jeani Hawking, MD;  [facility]: [facility];  Service: Endoscopy;;   JOINT REPLACEMENT     OPEN REDUCTION LE FORT I FRACTURE  1980s   PARTIAL KNEE ARTHROPLASTY Left 01/17/2017   Procedure: UNICOMPARTMENTAL LEFT KNEE;  Surgeon: Durene Romans, MD;  Location: [facility];  Service: Orthopedics;  Laterality:  Left;   POLYPECTOMY  10/12/2019   Procedure: POLYPECTOMY;  Surgeon: Jeani Hawking, MD;  [facility]: [facility];  Service: Endoscopy;;   RADIOLOGY WITH ANESTHESIA N/A 08/11/2017   Procedure: MRI OF BRAIN WITH AND WITHOUT CONSTRAST, AND OF THE ORBIT WITH AND WITHOUT CONTRAST;  Surgeon: Radiologist, Medication, MD;  [facility];  Service: Radiology;  Laterality: N/A;   RADIOLOGY WITH ANESTHESIA N/A 09/06/2017   Procedure: MRI OF BRAIN;  Surgeon: Radiologist, Medication, MD;  [facility];  Service: Radiology;  Laterality: N/A;   RADIOLOGY WITH ANESTHESIA Right 03/03/2023   Procedure: MRI SHOULDER WITHOUT CONTRAST WITH ANESTHESIA;  Surgeon: Radiologist, Medication, MD;  [facility];  Service: Radiology;  Laterality: Right;   RHINOPLASTY     SHOULDER ARTHROSCOPY WITH ROTATOR CUFF REPAIR Right 04/25/2023   Procedure: RIGHT SHOULDER ARTHROSCOPY WITH ROTATOR CUFF REPAIR, ACROMIOPLASTY;  Surgeon: Huel Cote, MD;  [facility]: [facility];  Service: Orthopedics;  Laterality: Right;   TOTAL KNEE ARTHROPLASTY Right 09/30/2015   Procedure: TOTAL RIGHT KNEE ARTHROPLASTY;  Surgeon: Durene Romans, MD;  [facility];  Service: Orthopedics;  Laterality: Right;   VAGINAL HYSTERECTOMY     Patient Active Problem List   Diagnosis Date Noted   UTI (urinary tract infection) 10/07/2018   Sepsis (HCC) 10/07/2018   Hypokalemia 10/07/2018   S/P  total thyroidectomy 10/07/2018   Rheumatoid arthritis (HCC) 09/18/2018   Papillary thyroid carcinoma (HCC) 08/24/2018   TIA (transient ischemic attack) 02/14/2018   Hyperlipidemia    Ischemic optic neuropathy of right eye    Stroke (HCC) 09/04/2017   HTN (hypertension) 09/04/2017   DM (diabetes mellitus) (HCC) 09/04/2017   Polymyalgia rheumatica (HCC) 09/04/2017   Fibromyalgia 09/04/2017   S/P left UKR 01/17/2017   Overweight (BMI 25.0-29.9) 10/01/2015   S/P right TKA 09/30/2015    PCP: Irena Reichmann DO  REFERRING PROVIDER: Huel Cote, MD   REFERRING DIAG:  [phone] (ICD-10-CM) - Traumatic complete tear of right rotator cuff, initial encounter  THERAPY DIAG:  Right shoulder pain, unspecified chronicity  Muscle weakness (generalized)  Rationale for Evaluation and Treatment: Rehabilitation  ONSET DATE: DOS 04/25/23  R RCR  SUBJECTIVE:                                                                                                                                                                                      SUBJECTIVE STATEMENT: Doing well. Just minimal discomfort.   Hand dominance: Right  PERTINENT HISTORY: Hx cancer, fibromyalgia, HTN, HLD, RA, Hx CVA/TIA, DM  PAIN:  1-2/10 current, 4/10 worst, 1/10 best  PRECAUTIONS: Shoulder R RCR  RED FLAGS: None   WEIGHT BEARING RESTRICTIONS:  NWB RUE  FALLS:  Has patient fallen in last 6 months? No   PLOF: Independent  PATIENT GOALS:get this arm working again    OBJECTIVE:  Observation:  pt has steri strips over portals/incision     TODAY'S TREATMENT:                                                                                                                                          Treatment                            10/26:  PROM and STM to shoulder P[place]ys flexion Table slide flexion Isometric shoulder extension Practiced AROM flexion- advised scap retraction &  max at 90 deg    PATIENT EDUCATION:  Education details:  POC, ROM findings, exercise form, HEP, and precautions/protocol. Person educated: Patient Education method: Explanation, Demonstration Education comprehension: verbalized understanding, returned demonstration  HOME EXERCISE PROGRAM: Access Code: [email or web address] URL: [email or web address]   ASSESSMENT:  CLINICAL IMPRESSION: Excellent tolerance to [facility] today and will continue to progress as appropriate. F/u with MD on Wed for 6 wk.    OBJECTIVE IMPAIRMENTS: decreased activity tolerance, decreased endurance, decreased mobility,  decreased ROM, decreased strength, impaired flexibility, impaired UE functional use, improper body mechanics, postural dysfunction, and pain.   ACTIVITY LIMITATIONS: carrying, lifting, bending, bed mobility, bathing, dressing, self feeding, reach over head, hygiene/grooming, locomotion level, and caring for others  PARTICIPATION LIMITATIONS: meal prep, cleaning, laundry, driving, shopping, community activity, and yard work  PERSONAL FACTORS: 3+ comorbidities: Hx cancer, fibromyalgia, HTN, HLD, RA, Hx CVA/TIA, DM  are also affecting patient's functional outcome.   REHAB POTENTIAL: Good  CLINICAL DECISION MAKING: Stable/uncomplicated  EVALUATION COMPLEXITY: Low   GOALS: Goals reviewed with patient? Yes  SHORT TERM GOALS: Target date: 05/25/2023    Patient will be independent with HEP in order to improve functional outcomes. Baseline: Goal status: GOAL MET  10/24  2.  Patient will report at least 25% improvement in symptoms for improved quality of life. Baseline:  Goal status: GOAL MET   10/24  3.  Patient will tolerate beginning to wean from sling with MD approval for improved functional use of RUE.  Baseline:  Goal status: INITIAL    LONG TERM GOALS: Target date: 07/20/2023    Patient will report at least 75% improvement in symptoms for improved quality of life. Baseline:  Goal status: INITIAL  2.  Patient will improve FOTO score by at least 32 points in order to indicate improved tolerance to activity. Baseline: 29% function Goal status: INITIAL  3.  Patient will demonstrate at least 150 degrees in shoulder AROM in flexion for improved ability lift overhead. Baseline:  Goal status: INITIAL  4.  Patient will demonstrate grade of at least 4+/5 MMT grade in all tested musculature as evidence of improved strength to assist with lifting at home. Baseline:  Goal status: INITIAL  5.  Patient will be progressing with resisted strengthening in order to build strength for  chores and exercise.  Baseline:  Goal status: INITIAL    PLAN:  PT FREQUENCY: 2x/wk  PT DURATION: 7 weeks  PLANNED INTERVENTIONS: Therapeutic exercises, Therapeutic activity, Neuromuscular re-education, Balance training, Gait training, Patient/Family education, Joint manipulation, Joint mobilization, Stair training, Orthotic/Fit training, DME instructions, Aquatic Therapy, Dry Needling, Electrical stimulation, Spinal manipulation, Spinal mobilization, Cryotherapy, Moist heat, Compression bandaging, scar mobilization, Splintting, Taping, Traction, Ultrasound, Ionotophoresis 4mg /ml Dexamethasone, and Manual therapy   PLAN FOR NEXT SESSION: Continue to progress per Dr. Serena Croissant RCR protocol Progress Conrad McCulloch C. Clayvon Parlett PT, DPT 06/04/23 12:24 PM

## 2023-06-07 ENCOUNTER — Ambulatory Visit (HOSPITAL_BASED_OUTPATIENT_CLINIC_OR_DEPARTMENT_OTHER): Payer: PPO | Admitting: Physical Therapy

## 2023-06-07 DIAGNOSIS — M25511 Pain in right shoulder: Secondary | ICD-10-CM | POA: Diagnosis not present

## 2023-06-07 DIAGNOSIS — R29898 Other symptoms and signs involving the musculoskeletal system: Secondary | ICD-10-CM

## 2023-06-07 DIAGNOSIS — M6281 Muscle weakness (generalized): Secondary | ICD-10-CM

## 2023-06-07 NOTE — Therapy (Signed)
OUTPATIENT PHYSICAL THERAPY SHOULDER TREATMENT      Patient Name: Misty Morris MRN: [id number] DOB:[birth year], 72 y.o., female Today's Date: 06/08/2023  END OF SESSION:  PT End of Session - 06/07/23 1631     Visit Number 12    Number of Visits 24    Date for PT Re-Evaluation 07/20/23    Authorization Type [employer]    PT Start Time 1534    PT Stop Time 1618    PT Time Calculation (min) 44 min    Activity Tolerance Patient tolerated treatment well    Behavior During Therapy [facility] for tasks assessed/performed                    Past Medical History:  Diagnosis Date   Cancer (HCC)    hx of thyroid cancer   Fibromyalgia    Hypercholesterolemia    Hypertension    Hypothyroidism    thyroid removed   Polymyalgia rheumatica (HCC)    Rheumatoid arthritis (HCC)    "atypical/MD 08/2017" (02/14/2018)   Shingles    Stroke (HCC) 08/2017   "no residual" (02/14/2018)   TIA (transient ischemic attack) 02/14/2018   Type 2 diabetes, diet controlled (HCC)    Vision impairment    right eye   Past Surgical History:  Procedure Laterality Date   CATARACT EXTRACTION W/ INTRAOCULAR LENS  IMPLANT, BILATERAL     COLONOSCOPY WITH PROPOFOL N/A 10/12/2019   Procedure: COLONOSCOPY WITH PROPOFOL;  Surgeon: Jeani Hawking, MD;  Location: [facility];  Service: Endoscopy;  Laterality: N/A;   COLONOSCOPY WITH PROPOFOL N/A 12/24/2022   Procedure: COLONOSCOPY WITH PROPOFOL;  Surgeon: Jeani Hawking, MD;  Location: [facility];  Service: Gastroenterology;  Laterality: N/A;   HEMOSTASIS CLIP PLACEMENT  10/12/2019   Procedure: HEMOSTASIS CLIP PLACEMENT;  Surgeon: Jeani Hawking, MD;  Location: [facility];  Service: Endoscopy;;   JOINT REPLACEMENT     OPEN REDUCTION LE FORT I FRACTURE  1980s   PARTIAL KNEE ARTHROPLASTY Left 01/17/2017   Procedure: UNICOMPARTMENTAL LEFT KNEE;  Surgeon: Durene Romans, MD;  Location: [facility];  Service: Orthopedics;  Laterality: Left;   POLYPECTOMY  10/12/2019    Procedure: POLYPECTOMY;  Surgeon: Jeani Hawking, MD;  Location: [facility];  Service: Endoscopy;;   RADIOLOGY WITH ANESTHESIA N/A 08/11/2017   Procedure: MRI OF BRAIN WITH AND WITHOUT CONSTRAST, AND OF THE ORBIT WITH AND WITHOUT CONTRAST;  Surgeon: Radiologist, Medication, MD;  Location: [facility];  Service: Radiology;  Laterality: N/A;   RADIOLOGY WITH ANESTHESIA N/A 09/06/2017   Procedure: MRI OF BRAIN;  Surgeon: Radiologist, Medication, MD;  Location: [facility];  Service: Radiology;  Laterality: N/A;   RADIOLOGY WITH ANESTHESIA Right 03/03/2023   Procedure: MRI SHOULDER WITHOUT CONTRAST WITH ANESTHESIA;  Surgeon: Radiologist, Medication, MD;  Location: [facility];  Service: Radiology;  Laterality: Right;   RHINOPLASTY     SHOULDER ARTHROSCOPY WITH ROTATOR CUFF REPAIR Right 04/25/2023   Procedure: RIGHT SHOULDER ARTHROSCOPY WITH ROTATOR CUFF REPAIR, ACROMIOPLASTY;  Surgeon: Huel Cote, MD;  Location: [facility];  Service: Orthopedics;  Laterality: Right;   TOTAL KNEE ARTHROPLASTY Right 09/30/2015   Procedure: TOTAL RIGHT KNEE ARTHROPLASTY;  Surgeon: Durene Romans, MD;  Location: [facility];  Service: Orthopedics;  Laterality: Right;   VAGINAL HYSTERECTOMY     Patient Active Problem List   Diagnosis Date Noted   UTI (urinary tract infection) 10/07/2018   Sepsis (HCC) 10/07/2018   Hypokalemia 10/07/2018   S/P total thyroidectomy 10/07/2018   Rheumatoid arthritis (HCC)  09/18/2018   Papillary thyroid carcinoma (HCC) 08/24/2018   TIA (transient ischemic attack) 02/14/2018   Hyperlipidemia    Ischemic optic neuropathy of right eye    Stroke (HCC) 09/04/2017   HTN (hypertension) 09/04/2017   DM (diabetes mellitus) (HCC) 09/04/2017   Polymyalgia rheumatica (HCC) 09/04/2017   Fibromyalgia 09/04/2017   S/P left UKR 01/17/2017   Overweight (BMI 25.0-29.9) 10/01/2015   S/P right TKA 09/30/2015    PCP: Irena Reichmann DO  REFERRING PROVIDER: Huel Cote, MD   REFERRING DIAG: [phone] (ICD-10-CM) -  Traumatic complete tear of right rotator cuff, initial encounter  THERAPY DIAG:  Right shoulder pain, unspecified chronicity  Muscle weakness (generalized)  Other symptoms and signs involving the musculoskeletal system  Rationale for Evaluation and Treatment: Rehabilitation  ONSET DATE: DOS 04/25/23  R RCR  SUBJECTIVE:                                                                                                                                                                                      SUBJECTIVE STATEMENT: Pt denies any adverse effects after prior Rx.  Pt states it felt good to move her shoulder last Rx.  Pt reports compliance with HEP.  Pt sees MD tomorrow.  Hand dominance: Right  PERTINENT HISTORY: Hx cancer, fibromyalgia, HTN, HLD, RA, Hx CVA/TIA, DM  PAIN:  1-2/10 current, 4/10 worst, 1/10 best  PRECAUTIONS: Shoulder R RCR  RED FLAGS: None   WEIGHT BEARING RESTRICTIONS:  NWB RUE  FALLS:  Has patient fallen in last 6 months? No   PLOF: Independent  PATIENT GOALS:get this arm working again    OBJECTIVE:  Observation:  pt has steri strips over portals/incision     TODAY'S TREATMENT:                                                                                                                                          Pt received R shoulder PROM flexion, scaption, ER, and IR per pt and tissue tolerance w/n protocol range.  Pt performed: Supine flexion AAROM  with hands clasped 2x10 Supine wand ER 2x10 Seated table slides  Reviewed HEP and updated HEP.  Pt received a HEP handout and was educated in correct form and appropriate frequency.  PT instructed pt to not perform into a painful or tight range and she should not have increased pain with exercises.      PATIENT EDUCATION:  Education details:  POC, exercise form, HEP, and precautions/protocol.  PT answered pt's questions.  Person educated: Patient Education method: Explanation,  Demonstration Education comprehension: verbalized understanding, returned demonstration  HOME EXERCISE PROGRAM: Access Code: [email or web address] URL: [email or web address]  Updated HEP: - Supine Shoulder Flexion AAROM   - 2 x daily - 7 x weekly - 2 sets - 10 reps - Supine Shoulder External Rotation in 45 Degrees Abduction AAROM with Dowel  - 2 x daily - 7 x weekly - 2 sets - 10 reps   ASSESSMENT:  CLINICAL IMPRESSION: Pt is progressing well with protocol.  She is compliant with HEP.  Pt performed AAROM exercises per protocol well with cuing and instruction in correct form and also tolerated exercises well.  PT updated HEP and reviewed HEP.  She demonstrates good understanding of HEP.  Pt tolerated PROM well and is progressing with ROM.  Pt responded well to Rx having no increased pain and no c/o's after Rx. She will cont to benefit from cont skilled PT services to improve ROM, address goals, and to assist in restoring desired level of function.     OBJECTIVE IMPAIRMENTS: decreased activity tolerance, decreased endurance, decreased mobility, decreased ROM, decreased strength, impaired flexibility, impaired UE functional use, improper body mechanics, postural dysfunction, and pain.   ACTIVITY LIMITATIONS: carrying, lifting, bending, bed mobility, bathing, dressing, self feeding, reach over head, hygiene/grooming, locomotion level, and caring for others  PARTICIPATION LIMITATIONS: meal prep, cleaning, laundry, driving, shopping, community activity, and yard work  PERSONAL FACTORS: 3+ comorbidities: Hx cancer, fibromyalgia, HTN, HLD, RA, Hx CVA/TIA, DM  are also affecting patient's functional outcome.   REHAB POTENTIAL: Good  CLINICAL DECISION MAKING: Stable/uncomplicated  EVALUATION COMPLEXITY: Low   GOALS: Goals reviewed with patient? Yes  SHORT TERM GOALS: Target date: 05/25/2023    Patient will be independent with HEP in order to improve functional outcomes. Baseline: Goal  status: GOAL MET  10/24  2.  Patient will report at least 25% improvement in symptoms for improved quality of life. Baseline:  Goal status: GOAL MET   10/24  3.  Patient will tolerate beginning to wean from sling with MD approval for improved functional use of RUE.  Baseline:  Goal status: INITIAL    LONG TERM GOALS: Target date: 07/20/2023    Patient will report at least 75% improvement in symptoms for improved quality of life. Baseline:  Goal status: INITIAL  2.  Patient will improve FOTO score by at least 32 points in order to indicate improved tolerance to activity. Baseline: 29% function Goal status: INITIAL  3.  Patient will demonstrate at least 150 degrees in shoulder AROM in flexion for improved ability lift overhead. Baseline:  Goal status: INITIAL  4.  Patient will demonstrate grade of at least 4+/5 MMT grade in all tested musculature as evidence of improved strength to assist with lifting at home. Baseline:  Goal status: INITIAL  5.  Patient will be progressing with resisted strengthening in order to build strength for chores and exercise.  Baseline:  Goal status: INITIAL    PLAN:  PT FREQUENCY: 2x/wk  PT DURATION: 7 weeks  PLANNED  INTERVENTIONS: Therapeutic exercises, Therapeutic activity, Neuromuscular re-education, Balance training, Gait training, Patient/Family education, Joint manipulation, Joint mobilization, Stair training, Orthotic/Fit training, DME instructions, Aquatic Therapy, Dry Needling, Electrical stimulation, Spinal manipulation, Spinal mobilization, Cryotherapy, Moist heat, Compression bandaging, scar mobilization, Splintting, Taping, Traction, Ultrasound, Ionotophoresis 4mg /ml Dexamethasone, and Manual therapy   PLAN FOR NEXT SESSION: Continue to progress per Dr. Serena Croissant RCR protocol.  Cont with PROM and AAROM per protocol.   Audie Clear III PT, DPT 06/08/23 9:08 PM

## 2023-06-08 ENCOUNTER — Ambulatory Visit (INDEPENDENT_AMBULATORY_CARE_PROVIDER_SITE_OTHER): Payer: PPO | Admitting: Orthopaedic Surgery

## 2023-06-08 ENCOUNTER — Encounter (HOSPITAL_BASED_OUTPATIENT_CLINIC_OR_DEPARTMENT_OTHER): Payer: Self-pay | Admitting: Physical Therapy

## 2023-06-08 DIAGNOSIS — S46011A Strain of muscle(s) and tendon(s) of the rotator cuff of right shoulder, initial encounter: Secondary | ICD-10-CM | POA: Diagnosis not present

## 2023-06-08 NOTE — Progress Notes (Signed)
Post Operative Evaluation    Procedure/Date of Surgery: Right shoulder rotator cuff repair 9/16  Interval History:   Presents today 6 weeks status post right shoulder rotator cuff repair.  Presents today overall doing extremely well.  Overhead range of motion is improving.  She has minimal to no pain involving the shoulder.  She has been working with physical therapy   PMH/PSH/Family History/Social History/Meds/Allergies:    Past Medical History:  Diagnosis Date   Cancer (HCC)    hx of thyroid cancer   Fibromyalgia    Hypercholesterolemia    Hypertension    Hypothyroidism    thyroid removed   Polymyalgia rheumatica (HCC)    Rheumatoid arthritis (HCC)    "atypical/MD 08/2017" (02/14/2018)   Shingles    Stroke (HCC) 08/2017   "no residual" (02/14/2018)   TIA (transient ischemic attack) 02/14/2018   Type 2 diabetes, diet controlled (HCC)    Vision impairment    right eye   Past Surgical History:  Procedure Laterality Date   CATARACT EXTRACTION W/ INTRAOCULAR LENS  IMPLANT, BILATERAL     COLONOSCOPY WITH PROPOFOL N/A 10/12/2019   Procedure: COLONOSCOPY WITH PROPOFOL;  Surgeon: Jeani Hawking, MD;  [facility]: [facility];  Service: Endoscopy;  Laterality: N/A;   COLONOSCOPY WITH PROPOFOL N/A 12/24/2022   Procedure: COLONOSCOPY WITH PROPOFOL;  Surgeon: Jeani Hawking, MD;  Location: [facility];  Service: Gastroenterology;  Laterality: N/A;   HEMOSTASIS CLIP PLACEMENT  10/12/2019   Procedure: HEMOSTASIS CLIP PLACEMENT;  Surgeon: Jeani Hawking, MD;  Location: [facility];  Service: Endoscopy;;   JOINT REPLACEMENT     OPEN REDUCTION LE FORT I FRACTURE  1980s   PARTIAL KNEE ARTHROPLASTY Left 01/17/2017   Procedure: UNICOMPARTMENTAL LEFT KNEE;  Surgeon: Durene Romans, MD;  Location: [facility];  Service: Orthopedics;  Laterality: Left;   POLYPECTOMY  10/12/2019   Procedure: POLYPECTOMY;  Surgeon: Jeani Hawking, MD;  [facility]: [facility];  Service: Endoscopy;;    RADIOLOGY WITH ANESTHESIA N/A 08/11/2017   Procedure: MRI OF BRAIN WITH AND WITHOUT CONSTRAST, AND OF THE ORBIT WITH AND WITHOUT CONTRAST;  Surgeon: Radiologist, Medication, MD;  Location: [facility];  Service: Radiology;  Laterality: N/A;   RADIOLOGY WITH ANESTHESIA N/A 09/06/2017   Procedure: MRI OF BRAIN;  Surgeon: Radiologist, Medication, MD;  Location: [facility];  Service: Radiology;  Laterality: N/A;   RADIOLOGY WITH ANESTHESIA Right 03/03/2023   Procedure: MRI SHOULDER WITHOUT CONTRAST WITH ANESTHESIA;  Surgeon: Radiologist, Medication, MD;  Location: [facility];  Service: Radiology;  Laterality: Right;   RHINOPLASTY     SHOULDER ARTHROSCOPY WITH ROTATOR CUFF REPAIR Right 04/25/2023   Procedure: RIGHT SHOULDER ARTHROSCOPY WITH ROTATOR CUFF REPAIR, ACROMIOPLASTY;  Surgeon: Huel Cote, MD;  Location: [facility];  Service: Orthopedics;  Laterality: Right;   TOTAL KNEE ARTHROPLASTY Right 09/30/2015   Procedure: TOTAL RIGHT KNEE ARTHROPLASTY;  Surgeon: Durene Romans, MD;  Location: [facility];  Service: Orthopedics;  Laterality: Right;   VAGINAL HYSTERECTOMY     Social History   Socioeconomic History   Marital status: Single    Spouse name: Not on file   Number of children: 0   Years of education: Not on file   Highest education level: Not on file  Occupational History   Not on file  Tobacco Use   Smoking status: Former  Current packs/day: 0.00    Average packs/day: 1 pack/day for 25.0 years (25.0 ttl pk-yrs)    Types: Cigarettes    Start date: 12/27/1986    Quit date: 12/27/2011    Years since quitting: 11.4   Smokeless tobacco: Never  Vaping Use   Vaping status: Never Used  Substance and Sexual Activity   Alcohol use: Never   Drug use: Yes    Types: Marijuana    Comment: last smoked 03/28/23   Sexual activity: Not Currently  Other Topics Concern   Not on file  Social History Narrative   Not on file   Social Determinants of Health   Financial Resource Strain: Not on file  Food Insecurity:  Not on file  Transportation Needs: Not on file  Physical Activity: Not on file  Stress: Not on file  Social Connections: Not on file   Family History  Problem Relation Age of Onset   Hypertension Mother    Seizures Mother    Hypertension Father    Hypertension Sister    Pancreatic cancer Other    Allergies  Allergen Reactions   Codeine Hives   Demerol [Meperidine] Hives   Latex Dermatitis and Other (See Comments)    Blistering  Other Reaction(s): Unknown   Morphine And Codeine Hives   Sulfa Antibiotics Hives   Tramadol Hives   Vicodin [Hydrocodone-Acetaminophen] Itching    Elevated blood pressure. Tolerates low doses.   Adalimumab     Other Reaction(s): hair loss   Current Outpatient Medications  Medication Sig Dispense Refill   ACETYLCARN-ALPHA LIPOIC ACID PO Take 1 capsule by mouth 2 (two) times daily.     amLODipine (NORVASC) 5 MG tablet Take 5 mg by mouth every evening.   2   Calcium-Magnesium-Vitamin D (CALCIUM 1200+D3 PO) Take 1 tablet by mouth every evening.     Cholecalciferol (VITAMIN D) 50 MCG (2000 UT) tablet Take 2,000 Units by mouth daily.     Chromium Picolinate 200 MCG CAPS Take 200 mcg by mouth daily.     clopidogrel (PLAVIX) 75 MG tablet Take 1 tablet (75 mg total) by mouth daily. 30 tablet 3   Coenzyme Q10 (CO Q-10) 300 MG CAPS Take 300 mg by mouth every evening.     diphenhydrAMINE (BENADRYL) 25 MG tablet Take 25 mg by mouth at bedtime.     estradiol (ESTRACE) 0.1 MG/GM vaginal cream Place 1 Applicatorful vaginally 2 (two) times a week.     HYDROmorphone (DILAUDID) 2 MG tablet Take 0.5 tablets (1 mg total) by mouth every 4 (four) hours as needed for severe pain. 5 tablet 0   ibuprofen (ADVIL) 200 MG tablet Take 400 mg by mouth every 6 (six) hours as needed for moderate pain or headache (about 4 or 5 time a week).     levothyroxine (SYNTHROID) 88 MCG tablet Take 88 mcg by mouth daily before breakfast.     Lysine 600 MG TABS Take 600 mg by mouth every  evening.     moxifloxacin (VIGAMOX) 0.5 % ophthalmic solution Place 1 drop into the left eye daily.     Omega-3 Fatty Acids (FISH OIL PO) Take 1,400 mg by mouth daily.     oxyCODONE (ROXICODONE) 5 MG immediate release tablet Take 1 tablet (5 mg total) by mouth every 4 (four) hours as needed for severe pain or breakthrough pain. 30 tablet 0   Polyvinyl Alcohol-Povidone (REFRESH OP) Place 1 drop into the left eye See admin instructions. Middle of the night 0300  as needed     predniSONE (DELTASONE) 1 MG tablet Take 4 mg by mouth every evening.     rosuvastatin (CRESTOR) 20 MG tablet Take 1 tablet (20 mg total) by mouth daily at 6 PM. 30 tablet 3   timolol (TIMOPTIC) 0.25 % ophthalmic solution Place 1 drop into both eyes daily.     ZIOPTAN 0.0015 % SOLN Place 1 drop into both eyes at bedtime.  3   zoledronic acid (RECLAST) 5 MG/100ML SOLN injection Inject 5 mg into the vein once. 02/22/23 Yearly     No current facility-administered medications for this visit.   No results found.  Review of Systems:   A ROS was performed including pertinent positives and negatives as documented in the HPI.   Musculoskeletal Exam:    There were no vitals taken for this visit.  Right shoulder incisions are well-appearing without erythema or drainage.  In the standing position she is able to active range of motion to approximately 90 degrees without pain.  External rotation at side is to 45.  Internal rotation deferred today   Distal neurosensory exam is intact  Imaging:      I personally reviewed and interpreted the radiographs.   Assessment:   6-week status post right shoulder rotator cuff repair doing very well today's visit.  At this time she will advance to active range of motion.  Should continue to follow the rehab protocol for rotator cuff repair.  I will plan to see her back in 6 weeks for reassessment Plan :    -Return to clinic 6 weeks for reassessment      I personally saw and evaluated  the patient, and participated in the management and treatment plan.  Huel Cote, MD Attending Physician, Orthopedic Surgery  This document was dictated using Dragon voice recognition software. A reasonable attempt at proof reading has been made to minimize errors.

## 2023-06-09 ENCOUNTER — Ambulatory Visit (HOSPITAL_BASED_OUTPATIENT_CLINIC_OR_DEPARTMENT_OTHER): Payer: PPO | Admitting: Physical Therapy

## 2023-06-09 DIAGNOSIS — M25511 Pain in right shoulder: Secondary | ICD-10-CM

## 2023-06-09 DIAGNOSIS — R29898 Other symptoms and signs involving the musculoskeletal system: Secondary | ICD-10-CM

## 2023-06-09 DIAGNOSIS — M6281 Muscle weakness (generalized): Secondary | ICD-10-CM

## 2023-06-10 ENCOUNTER — Encounter (HOSPITAL_BASED_OUTPATIENT_CLINIC_OR_DEPARTMENT_OTHER): Payer: Self-pay | Admitting: Physical Therapy

## 2023-06-14 ENCOUNTER — Ambulatory Visit (HOSPITAL_BASED_OUTPATIENT_CLINIC_OR_DEPARTMENT_OTHER): Payer: PPO | Attending: Orthopaedic Surgery | Admitting: Physical Therapy

## 2023-06-14 DIAGNOSIS — M25511 Pain in right shoulder: Secondary | ICD-10-CM | POA: Insufficient documentation

## 2023-06-14 DIAGNOSIS — M6281 Muscle weakness (generalized): Secondary | ICD-10-CM | POA: Diagnosis not present

## 2023-06-14 DIAGNOSIS — R29898 Other symptoms and signs involving the musculoskeletal system: Secondary | ICD-10-CM | POA: Diagnosis not present

## 2023-06-14 NOTE — Therapy (Signed)
OUTPATIENT PHYSICAL THERAPY SHOULDER TREATMENT      Patient Name: Misty Morris MRN: [id number] DOB:March 03, 1951, 72 y.o., female Today's Date: 06/15/2023  END OF SESSION:  PT End of Session - 06/14/23 1324     Visit Number 14    Number of Visits 24    Date for PT Re-Evaluation 07/20/23    Authorization Type [employer]    PT Start Time 1321    PT Stop Time 1403    PT Time Calculation (min) 42 min    Activity Tolerance Patient tolerated treatment well    Behavior During Therapy WFL for tasks assessed/performed                    Past Medical History:  Diagnosis Date   Cancer (HCC)    hx of thyroid cancer   Fibromyalgia    Hypercholesterolemia    Hypertension    Hypothyroidism    thyroid removed   Polymyalgia rheumatica (HCC)    Rheumatoid arthritis (HCC)    "atypical/MD 08/2017" (02/14/2018)   Shingles    Stroke (HCC) 08/2017   "no residual" (02/14/2018)   TIA (transient ischemic attack) 02/14/2018   Type 2 diabetes, diet controlled (HCC)    Vision impairment    right eye   Past Surgical History:  Procedure Laterality Date   CATARACT EXTRACTION W/ INTRAOCULAR LENS  IMPLANT, BILATERAL     COLONOSCOPY WITH PROPOFOL N/A 10/12/2019   Procedure: COLONOSCOPY WITH PROPOFOL;  Surgeon: Jeani Hawking, MD;  Location: [facility];  Service: Endoscopy;  Laterality: N/A;   COLONOSCOPY WITH PROPOFOL N/A 12/24/2022   Procedure: COLONOSCOPY WITH PROPOFOL;  Surgeon: Jeani Hawking, MD;  Location: [facility];  Service: Gastroenterology;  Laterality: N/A;   HEMOSTASIS CLIP PLACEMENT  10/12/2019   Procedure: HEMOSTASIS CLIP PLACEMENT;  Surgeon: Jeani Hawking, MD;  Location: [facility];  Service: Endoscopy;;   JOINT REPLACEMENT     OPEN REDUCTION LE FORT I FRACTURE  1980s   PARTIAL KNEE ARTHROPLASTY Left 01/17/2017   Procedure: UNICOMPARTMENTAL LEFT KNEE;  Surgeon: Durene Romans, MD;  Location: [facility];  Service: Orthopedics;  Laterality: Left;   POLYPECTOMY  10/12/2019    Procedure: POLYPECTOMY;  Surgeon: Jeani Hawking, MD;  Location: [facility];  Service: Endoscopy;;   RADIOLOGY WITH ANESTHESIA N/A 08/11/2017   Procedure: MRI OF BRAIN WITH AND WITHOUT CONSTRAST, AND OF THE ORBIT WITH AND WITHOUT CONTRAST;  Surgeon: Radiologist, Medication, MD;  Location: [facility];  Service: Radiology;  Laterality: N/A;   RADIOLOGY WITH ANESTHESIA N/A 09/06/2017   Procedure: MRI OF BRAIN;  Surgeon: Radiologist, Medication, MD;  Location: [facility];  Service: Radiology;  Laterality: N/A;   RADIOLOGY WITH ANESTHESIA Right 03/03/2023   Procedure: MRI SHOULDER WITHOUT CONTRAST WITH ANESTHESIA;  Surgeon: Radiologist, Medication, MD;  Location: [facility];  Service: Radiology;  Laterality: Right;   RHINOPLASTY     SHOULDER ARTHROSCOPY WITH ROTATOR CUFF REPAIR Right 04/25/2023   Procedure: RIGHT SHOULDER ARTHROSCOPY WITH ROTATOR CUFF REPAIR, ACROMIOPLASTY;  Surgeon: Huel Cote, MD;  Location: [facility];  Service: Orthopedics;  Laterality: Right;   TOTAL KNEE ARTHROPLASTY Right 09/30/2015   Procedure: TOTAL RIGHT KNEE ARTHROPLASTY;  Surgeon: Durene Romans, MD;  Location: [facility];  Service: Orthopedics;  Laterality: Right;   VAGINAL HYSTERECTOMY     Patient Active Problem List   Diagnosis Date Noted   UTI (urinary tract infection) 10/07/2018   Sepsis (HCC) 10/07/2018   Hypokalemia 10/07/2018   S/P total thyroidectomy 10/07/2018   Rheumatoid arthritis (HCC)  09/18/2018   Papillary thyroid carcinoma (HCC) 08/24/2018   TIA (transient ischemic attack) 02/14/2018   Hyperlipidemia    Ischemic optic neuropathy of right eye    Stroke (HCC) 09/04/2017   HTN (hypertension) 09/04/2017   DM (diabetes mellitus) (HCC) 09/04/2017   Polymyalgia rheumatica (HCC) 09/04/2017   Fibromyalgia 09/04/2017   S/P left UKR 01/17/2017   Overweight (BMI 25.0-29.9) 10/01/2015   S/P right TKA 09/30/2015    PCP: Irena Reichmann DO  REFERRING PROVIDER: Huel Cote, MD   REFERRING DIAG: [phone] (ICD-10-CM) -  Traumatic complete tear of right rotator cuff, initial encounter  THERAPY DIAG:  Right shoulder pain, unspecified chronicity  Muscle weakness (generalized)  Other symptoms and signs involving the musculoskeletal system  Rationale for Evaluation and Treatment: Rehabilitation  ONSET DATE: DOS 04/25/23  R RCR  SUBJECTIVE:                                                                                                                                                                                      SUBJECTIVE STATEMENT: Pt is 7 weeks and 1 day post op.  Pt states she used her arm a little more than normal on Sunday, but didn't break any precautions.  Pt did have some increased soreness and hurt a little more on Monday.  Pt states it feels better today though still has some pain.  Pt reports compliance with HEP.  Pt denies any adverse effects after prior Rx.    Hand dominance: Right  PERTINENT HISTORY: Hx cancer, fibromyalgia, HTN, HLD, RA, Hx CVA/TIA, DM  PAIN:  2-3/10 current, 4/10 worst, 1/10 best  PRECAUTIONS: Shoulder R RCR  RED FLAGS: None   WEIGHT BEARING RESTRICTIONS:  NWB RUE  FALLS:  Has patient fallen in last 6 months? No   PLOF: Independent  PATIENT GOALS:get this arm working again    OBJECTIVE:  Observation:  pt has steri strips over portals/incision     TODAY'S TREATMENT:  Pt received R shoulder PROM flexion, scaption, ER, and IR per pt and tissue tolerance w/n protocol range.  Pt performed: Pulleys in flexion and scaption 2x15 each  Supine wand flexion 2x10 Supine wand ER 2x10 Seated table slides x10 reps Seated wand ER 2x10 Supine wand flexion with head elevated x10  Reviewed and Updated HEP.  Pt received a HEP handout and was educated in correct form and appropriate frequency.  PT instructed pt she should not  perform AAROM ex's into a painful or tight range and she should not have increased pain with HEP.     PATIENT EDUCATION:  Education details:  POC, exercise form, HEP, and precautions/protocol.  PT answered pt's questions.  Person educated: Patient Education method: Explanation, Demonstration Education comprehension: verbalized understanding, returned demonstration  HOME EXERCISE PROGRAM: Access Code: [email or web address] URL: [email or web address]  Updated HEP: - Supine Shoulder Wand flexion AAROM  - 2 x daily - 7 x weekly - 2 sets - 10 reps - Seated Shoulder External Rotation AAROM with Cane and Hand in Neutral  - 2 x daily - 7 x weekly - 1-2 sets - 10 reps   ASSESSMENT:  CLINICAL IMPRESSION: Pt is progressing well with ROM and protocol.  Pt tolerated PROM well and is improving with shoulder AAROM and PROM.  Pt performed AAROM exercises well with cuing and instruction in correct form.  She is compliant with HEP.  PT updated HEP and gave pt a HEP handout.  Pt demonstrates good understanding of HEP.  Pt responded well to Rx having no increased pain and no c/o's after Rx. She will cont to benefit from cont skilled PT services per protocol to improve ROM, address goals, and to assist in restoring desired level of function.      OBJECTIVE IMPAIRMENTS: decreased activity tolerance, decreased endurance, decreased mobility, decreased ROM, decreased strength, impaired flexibility, impaired UE functional use, improper body mechanics, postural dysfunction, and pain.   ACTIVITY LIMITATIONS: carrying, lifting, bending, bed mobility, bathing, dressing, self feeding, reach over head, hygiene/grooming, locomotion level, and caring for others  PARTICIPATION LIMITATIONS: meal prep, cleaning, laundry, driving, shopping, community activity, and yard work  PERSONAL FACTORS: 3+ comorbidities: Hx cancer, fibromyalgia, HTN, HLD, RA, Hx CVA/TIA, DM  are also affecting patient's functional outcome.   REHAB  POTENTIAL: Good  CLINICAL DECISION MAKING: Stable/uncomplicated  EVALUATION COMPLEXITY: Low   GOALS: Goals reviewed with patient? Yes  SHORT TERM GOALS: Target date: 05/25/2023    Patient will be independent with HEP in order to improve functional outcomes. Baseline: Goal status: GOAL MET  10/24  2.  Patient will report at least 25% improvement in symptoms for improved quality of life. Baseline:  Goal status: GOAL MET   10/24  3.  Patient will tolerate beginning to wean from sling with MD approval for improved functional use of RUE.  Baseline:  Goal status: INITIAL    LONG TERM GOALS: Target date: 07/20/2023    Patient will report at least 75% improvement in symptoms for improved quality of life. Baseline:  Goal status: INITIAL  2.  Patient will improve FOTO score by at least 32 points in order to indicate improved tolerance to activity. Baseline: 29% function Goal status: INITIAL  3.  Patient will demonstrate at least 150 degrees in shoulder AROM in flexion for improved ability lift overhead. Baseline:  Goal status: INITIAL  4.  Patient will demonstrate grade of at least 4+/5 MMT grade in all tested musculature as evidence of improved strength to assist with lifting  at home. Baseline:  Goal status: INITIAL  5.  Patient will be progressing with resisted strengthening in order to build strength for chores and exercise.  Baseline:  Goal status: INITIAL    PLAN:  PT FREQUENCY: 2x/wk  PT DURATION: 7 weeks  PLANNED INTERVENTIONS: Therapeutic exercises, Therapeutic activity, Neuromuscular re-education, Balance training, Gait training, Patient/Family education, Joint manipulation, Joint mobilization, Stair training, Orthotic/Fit training, DME instructions, Aquatic Therapy, Dry Needling, Electrical stimulation, Spinal manipulation, Spinal mobilization, Cryotherapy, Moist heat, Compression bandaging, scar mobilization, Splintting, Taping, Traction, Ultrasound,  Ionotophoresis 4mg /ml Dexamethasone, and Manual therapy   PLAN FOR NEXT SESSION: Continue to progress per Dr. Serena Croissant RCR protocol.  Cont with PROM and AAROM per protocol.  Attempt standing wall walks in flexion next visit.    Audie Clear III PT, DPT 06/15/23 11:21 PM

## 2023-06-15 ENCOUNTER — Encounter (HOSPITAL_BASED_OUTPATIENT_CLINIC_OR_DEPARTMENT_OTHER): Payer: Self-pay | Admitting: Physical Therapy

## 2023-06-16 ENCOUNTER — Encounter (HOSPITAL_BASED_OUTPATIENT_CLINIC_OR_DEPARTMENT_OTHER): Payer: PPO | Admitting: Physical Therapy

## 2023-06-17 ENCOUNTER — Encounter (HOSPITAL_BASED_OUTPATIENT_CLINIC_OR_DEPARTMENT_OTHER): Payer: Self-pay | Admitting: Physical Therapy

## 2023-06-17 ENCOUNTER — Ambulatory Visit (HOSPITAL_BASED_OUTPATIENT_CLINIC_OR_DEPARTMENT_OTHER): Payer: PPO | Admitting: Physical Therapy

## 2023-06-17 DIAGNOSIS — M25511 Pain in right shoulder: Secondary | ICD-10-CM

## 2023-06-17 DIAGNOSIS — R29898 Other symptoms and signs involving the musculoskeletal system: Secondary | ICD-10-CM

## 2023-06-17 DIAGNOSIS — M6281 Muscle weakness (generalized): Secondary | ICD-10-CM

## 2023-06-17 NOTE — Therapy (Signed)
OUTPATIENT PHYSICAL THERAPY SHOULDER TREATMENT      Patient Name: Misty Morris MRN: [id number] DOB:09/29/1950, 72 y.o., female Today's Date: 06/17/2023  END OF SESSION:  PT End of Session - 06/17/23 1326     Visit Number 15    Number of Visits 24    Date for PT Re-Evaluation 07/20/23    Authorization Type [employer]    PT Start Time 1320    PT Stop Time 1403    PT Time Calculation (min) 43 min    Activity Tolerance Patient tolerated treatment well    Behavior During Therapy WFL for tasks assessed/performed                    Past Medical History:  Diagnosis Date   Cancer (HCC)    hx of thyroid cancer   Fibromyalgia    Hypercholesterolemia    Hypertension    Hypothyroidism    thyroid removed   Polymyalgia rheumatica (HCC)    Rheumatoid arthritis (HCC)    "atypical/MD 08/2017" (02/14/2018)   Shingles    Stroke (HCC) 08/2017   "no residual" (02/14/2018)   TIA (transient ischemic attack) 02/14/2018   Type 2 diabetes, diet controlled (HCC)    Vision impairment    right eye   Past Surgical History:  Procedure Laterality Date   CATARACT EXTRACTION W/ INTRAOCULAR LENS  IMPLANT, BILATERAL     COLONOSCOPY WITH PROPOFOL N/A 10/12/2019   Procedure: COLONOSCOPY WITH PROPOFOL;  Surgeon: Jeani Hawking, MD;  Location: [facility];  Service: Endoscopy;  Laterality: N/A;   COLONOSCOPY WITH PROPOFOL N/A 12/24/2022   Procedure: COLONOSCOPY WITH PROPOFOL;  Surgeon: Jeani Hawking, MD;  Location: [facility];  Service: Gastroenterology;  Laterality: N/A;   HEMOSTASIS CLIP PLACEMENT  10/12/2019   Procedure: HEMOSTASIS CLIP PLACEMENT;  Surgeon: Jeani Hawking, MD;  Location: [facility];  Service: Endoscopy;;   JOINT REPLACEMENT     OPEN REDUCTION LE FORT I FRACTURE  1980s   PARTIAL KNEE ARTHROPLASTY Left 01/17/2017   Procedure: UNICOMPARTMENTAL LEFT KNEE;  Surgeon: Durene Romans, MD;  Location: [facility];  Service: Orthopedics;  Laterality: Left;   POLYPECTOMY  10/12/2019    Procedure: POLYPECTOMY;  Surgeon: Jeani Hawking, MD;  Location: [facility];  Service: Endoscopy;;   RADIOLOGY WITH ANESTHESIA N/A 08/11/2017   Procedure: MRI OF BRAIN WITH AND WITHOUT CONSTRAST, AND OF THE ORBIT WITH AND WITHOUT CONTRAST;  Surgeon: Radiologist, Medication, MD;  Location: [facility];  Service: Radiology;  Laterality: N/A;   RADIOLOGY WITH ANESTHESIA N/A 09/06/2017   Procedure: MRI OF BRAIN;  Surgeon: Radiologist, Medication, MD;  Location: [facility];  Service: Radiology;  Laterality: N/A;   RADIOLOGY WITH ANESTHESIA Right 03/03/2023   Procedure: MRI SHOULDER WITHOUT CONTRAST WITH ANESTHESIA;  Surgeon: Radiologist, Medication, MD;  Location: [facility];  Service: Radiology;  Laterality: Right;   RHINOPLASTY     SHOULDER ARTHROSCOPY WITH ROTATOR CUFF REPAIR Right 04/25/2023   Procedure: RIGHT SHOULDER ARTHROSCOPY WITH ROTATOR CUFF REPAIR, ACROMIOPLASTY;  Surgeon: Huel Cote, MD;  Location: [facility];  Service: Orthopedics;  Laterality: Right;   TOTAL KNEE ARTHROPLASTY Right 09/30/2015   Procedure: TOTAL RIGHT KNEE ARTHROPLASTY;  Surgeon: Durene Romans, MD;  Location: [facility];  Service: Orthopedics;  Laterality: Right;   VAGINAL HYSTERECTOMY     Patient Active Problem List   Diagnosis Date Noted   UTI (urinary tract infection) 10/07/2018   Sepsis (HCC) 10/07/2018   Hypokalemia 10/07/2018   S/P total thyroidectomy 10/07/2018   Rheumatoid arthritis (HCC)  09/18/2018   Papillary thyroid carcinoma (HCC) 08/24/2018   TIA (transient ischemic attack) 02/14/2018   Hyperlipidemia    Ischemic optic neuropathy of right eye    Stroke (HCC) 09/04/2017   HTN (hypertension) 09/04/2017   DM (diabetes mellitus) (HCC) 09/04/2017   Polymyalgia rheumatica (HCC) 09/04/2017   Fibromyalgia 09/04/2017   S/P left UKR 01/17/2017   Overweight (BMI 25.0-29.9) 10/01/2015   S/P right TKA 09/30/2015    PCP: Irena Reichmann DO  REFERRING PROVIDER: Huel Cote, MD   REFERRING DIAG: [phone] (ICD-10-CM) -  Traumatic complete tear of right rotator cuff, initial encounter  THERAPY DIAG:  Right shoulder pain, unspecified chronicity  Muscle weakness (generalized)  Other symptoms and signs involving the musculoskeletal system  Rationale for Evaluation and Treatment: Rehabilitation  ONSET DATE: DOS 04/25/23  R RCR  SUBJECTIVE:                                                                                                                                                                                      SUBJECTIVE STATEMENT: Pt is 7 weeks and 4 days post op.  "My arm is hurting a little more today."   Pt states she is using her R UE more at home.  Pt reports compliance with HEP.  Pt denies any adverse effects after prior Rx.    Hand dominance: Right  PERTINENT HISTORY: Hx cancer, fibromyalgia, HTN, HLD, RA, Hx CVA/TIA, DM  PAIN:  3-4/10 current, 4/10 worst, 1/10 best R lateral shoulder and medial biceps  PRECAUTIONS: Shoulder R RCR  RED FLAGS: None   WEIGHT BEARING RESTRICTIONS:  NWB RUE  FALLS:  Has patient fallen in last 6 months? No   PLOF: Independent  PATIENT GOALS:get this arm working again    OBJECTIVE:  Observation:  pt has steri strips over portals/incision     TODAY'S TREATMENT:                                                                                                                                          Pt  received R shoulder PROM flexion, abduction, ER, and IR per pt and tissue tolerance w/n protocol range.  Pt performed: Pulleys in flexion and scaption 2x10 each  Supine wand flexion 2x10 Supine wand ER 2x10 Seated wand ER 2x10 Standing wall walks in flexion approx 6 reps Standing wand flexion x 10 reps      PATIENT EDUCATION:  Education details:  POC, exercise form, HEP, and precautions/protocol.  PT answered pt's questions.  Person educated: Patient Education method: Explanation, Demonstration Education comprehension: verbalized  understanding, returned demonstration  HOME EXERCISE PROGRAM: Access Code: [email or web address] URL: [email or web address]    ASSESSMENT:  CLINICAL IMPRESSION: Pt continues to progress well with ROM and protocol.  Pt tolerated PROM well and is improving with shoulder AAROM and PROM.  She performed exercises per protocol well with cuing and instruction in correct form.  PT had pt perform standing AAROM.  Pt had pain with the transition of beginning the movement after completing the prior rep with standing wall walks in flexion.  PT had pt perform standing wand flexion and Pt didn't have pain.  She is compliant with HEP.  Pt responded well to Rx having no increased pain and no c/o's after Rx.  She will cont to benefit from cont skilled PT services per protocol to improve ROM, address goals, and to assist in restoring desired level of function.      OBJECTIVE IMPAIRMENTS: decreased activity tolerance, decreased endurance, decreased mobility, decreased ROM, decreased strength, impaired flexibility, impaired UE functional use, improper body mechanics, postural dysfunction, and pain.   ACTIVITY LIMITATIONS: carrying, lifting, bending, bed mobility, bathing, dressing, self feeding, reach over head, hygiene/grooming, locomotion level, and caring for others  PARTICIPATION LIMITATIONS: meal prep, cleaning, laundry, driving, shopping, community activity, and yard work  PERSONAL FACTORS: 3+ comorbidities: Hx cancer, fibromyalgia, HTN, HLD, RA, Hx CVA/TIA, DM  are also affecting patient's functional outcome.   REHAB POTENTIAL: Good  CLINICAL DECISION MAKING: Stable/uncomplicated  EVALUATION COMPLEXITY: Low   GOALS: Goals reviewed with patient? Yes  SHORT TERM GOALS: Target date: 05/25/2023    Patient will be independent with HEP in order to improve functional outcomes. Baseline: Goal status: GOAL MET  10/24  2.  Patient will report at least 25% improvement in symptoms for improved  quality of life. Baseline:  Goal status: GOAL MET   10/24  3.  Patient will tolerate beginning to wean from sling with MD approval for improved functional use of RUE.  Baseline:  Goal status: INITIAL    LONG TERM GOALS: Target date: 07/20/2023    Patient will report at least 75% improvement in symptoms for improved quality of life. Baseline:  Goal status: INITIAL  2.  Patient will improve FOTO score by at least 32 points in order to indicate improved tolerance to activity. Baseline: 29% function Goal status: INITIAL  3.  Patient will demonstrate at least 150 degrees in shoulder AROM in flexion for improved ability lift overhead. Baseline:  Goal status: INITIAL  4.  Patient will demonstrate grade of at least 4+/5 MMT grade in all tested musculature as evidence of improved strength to assist with lifting at home. Baseline:  Goal status: INITIAL  5.  Patient will be progressing with resisted strengthening in order to build strength for chores and exercise.  Baseline:  Goal status: INITIAL    PLAN:  PT FREQUENCY: 2x/wk  PT DURATION: 7 weeks  PLANNED INTERVENTIONS: Therapeutic exercises, Therapeutic activity, Neuromuscular re-education, Balance training, Gait training, Patient/Family education, Joint manipulation, Joint mobilization, Stair  training, Orthotic/Fit training, DME instructions, Aquatic Therapy, Dry Needling, Electrical stimulation, Spinal manipulation, Spinal mobilization, Cryotherapy, Moist heat, Compression bandaging, scar mobilization, Splintting, Taping, Traction, Ultrasound, Ionotophoresis 4mg /ml Dexamethasone, and Manual therapy   PLAN FOR NEXT SESSION: Continue to progress per Dr. Serena Croissant RCR protocol.  Cont with PROM and AAROM per protocol.  Audie Clear III PT, DPT 06/17/23 9:48 PM

## 2023-07-10 NOTE — Therapy (Signed)
OUTPATIENT PHYSICAL THERAPY SHOULDER TREATMENT      Patient Name: Misty Morris MRN: [id number] DOB:06/13/1951, 72 y.o., female Today's Date: 07/12/2023  END OF SESSION:  PT End of Session - 07/11/23 1029     Visit Number 16    Number of Visits 24    Date for PT Re-Evaluation 07/20/23    Authorization Type [employer]    PT Start Time 1025    PT Stop Time 1108    PT Time Calculation (min) 43 min    Activity Tolerance Patient tolerated treatment well    Behavior During Therapy WFL for tasks assessed/performed                     Past Medical History:  Diagnosis Date   Cancer (HCC)    hx of thyroid cancer   Fibromyalgia    Hypercholesterolemia    Hypertension    Hypothyroidism    thyroid removed   Polymyalgia rheumatica (HCC)    Rheumatoid arthritis (HCC)    "atypical/MD 08/2017" (02/14/2018)   Shingles    Stroke (HCC) 08/2017   "no residual" (02/14/2018)   TIA (transient ischemic attack) 02/14/2018   Type 2 diabetes, diet controlled (HCC)    Vision impairment    right eye   Past Surgical History:  Procedure Laterality Date   CATARACT EXTRACTION W/ INTRAOCULAR LENS  IMPLANT, BILATERAL     COLONOSCOPY WITH PROPOFOL N/A 10/12/2019   Procedure: COLONOSCOPY WITH PROPOFOL;  Surgeon: Jeani Hawking, MD;  Location: [facility];  Service: Endoscopy;  Laterality: N/A;   COLONOSCOPY WITH PROPOFOL N/A 12/24/2022   Procedure: COLONOSCOPY WITH PROPOFOL;  Surgeon: Jeani Hawking, MD;  Location: [facility];  Service: Gastroenterology;  Laterality: N/A;   HEMOSTASIS CLIP PLACEMENT  10/12/2019   Procedure: HEMOSTASIS CLIP PLACEMENT;  Surgeon: Jeani Hawking, MD;  Location: [facility];  Service: Endoscopy;;   JOINT REPLACEMENT     OPEN REDUCTION LE FORT I FRACTURE  1980s   PARTIAL KNEE ARTHROPLASTY Left 01/17/2017   Procedure: UNICOMPARTMENTAL LEFT KNEE;  Surgeon: Durene Romans, MD;  Location: [facility];  Service: Orthopedics;  Laterality: Left;   POLYPECTOMY  10/12/2019    Procedure: POLYPECTOMY;  Surgeon: Jeani Hawking, MD;  Location: [facility];  Service: Endoscopy;;   RADIOLOGY WITH ANESTHESIA N/A 08/11/2017   Procedure: MRI OF BRAIN WITH AND WITHOUT CONSTRAST, AND OF THE ORBIT WITH AND WITHOUT CONTRAST;  Surgeon: Radiologist, Medication, MD;  Location: [facility];  Service: Radiology;  Laterality: N/A;   RADIOLOGY WITH ANESTHESIA N/A 09/06/2017   Procedure: MRI OF BRAIN;  Surgeon: Radiologist, Medication, MD;  Location: [facility];  Service: Radiology;  Laterality: N/A;   RADIOLOGY WITH ANESTHESIA Right 03/03/2023   Procedure: MRI SHOULDER WITHOUT CONTRAST WITH ANESTHESIA;  Surgeon: Radiologist, Medication, MD;  Location: [facility];  Service: Radiology;  Laterality: Right;   RHINOPLASTY     SHOULDER ARTHROSCOPY WITH ROTATOR CUFF REPAIR Right 04/25/2023   Procedure: RIGHT SHOULDER ARTHROSCOPY WITH ROTATOR CUFF REPAIR, ACROMIOPLASTY;  Surgeon: Huel Cote, MD;  Location: [facility];  Service: Orthopedics;  Laterality: Right;   TOTAL KNEE ARTHROPLASTY Right 09/30/2015   Procedure: TOTAL RIGHT KNEE ARTHROPLASTY;  Surgeon: Durene Romans, MD;  Location: [facility];  Service: Orthopedics;  Laterality: Right;   VAGINAL HYSTERECTOMY     Patient Active Problem List   Diagnosis Date Noted   UTI (urinary tract infection) 10/07/2018   Sepsis (HCC) 10/07/2018   Hypokalemia 10/07/2018   S/P total thyroidectomy 10/07/2018   Rheumatoid arthritis (  HCC) 09/18/2018   Papillary thyroid carcinoma (HCC) 08/24/2018   TIA (transient ischemic attack) 02/14/2018   Hyperlipidemia    Ischemic optic neuropathy of right eye    Stroke (HCC) 09/04/2017   HTN (hypertension) 09/04/2017   DM (diabetes mellitus) (HCC) 09/04/2017   Polymyalgia rheumatica (HCC) 09/04/2017   Fibromyalgia 09/04/2017   S/P left UKR 01/17/2017   Overweight (BMI 25.0-29.9) 10/01/2015   S/P right TKA 09/30/2015    PCP: Irena Reichmann DO  REFERRING PROVIDER: Huel Cote, MD   REFERRING DIAG: [phone] (ICD-10-CM) -  Traumatic complete tear of right rotator cuff, initial encounter  THERAPY DIAG:  Right shoulder pain, unspecified chronicity  Muscle weakness (generalized)  Other symptoms and signs involving the musculoskeletal system  Rationale for Evaluation and Treatment: Rehabilitation  ONSET DATE: DOS 04/25/23  R RCR  SUBJECTIVE:                                                                                                                                                                                      SUBJECTIVE STATEMENT: Pt is 11 weeks s/p R shoulder arthroscopic rotator cuff repair, extensive debridement, and subacromial decompression.   Her pain has been about the same over the past 3 weeks.  Pt states it's hard to tell her shoulder/post op pain due to her other issues.  Pt states she has been going through a spell with PMR/fibromyalgia currently.  Pt reports compliance with HEP.  Pt feels better after performing HEP.  Pt denies any adverse effects after prior Rx.     Hand dominance: Right  PERTINENT HISTORY: Hx cancer, fibromyalgia, PMR, HTN, HLD, RA, Hx CVA/TIA, DM  PAIN:  Pt states her pain is about a 6/10 all over today due to PMR/fibromyalgia.  Her pain is in bilat Ue's/hands.  Her L UE bothers her more than her R UE.   PRECAUTIONS: Shoulder R RCR  RED FLAGS: None   WEIGHT BEARING RESTRICTIONS:  NWB RUE  FALLS:  Has patient fallen in last 6 months? No   PLOF: Independent  PATIENT GOALS:get this arm working again    OBJECTIVE:  Observation:  pt has steri strips over portals/incision     TODAY'S TREATMENT:  Pt received R shoulder PROM flexion, abduction, ER, and IR per pt and tissue tolerance w/n protocol range.  R shoulder ROM: PROM:  flex:  160, Abd:  126, ER:  65, IR:  70 deg AROM:  Flexion:  supine:  140, standing 146 deg,   ER:  62 deg   Pt performed: Supine serratus punch 2x10 Supine shoulder flexion AROM x 10 reps Standing wand flexion x 10 reps Standing wand scaption x10 reps S/L ER 2x10   Pt received a HEP handout and was educated in correct form and appropriate frequency.  PT instructed pt to not perform into a painful or tight range and she should not have pain with exercises.     PATIENT EDUCATION:  Education details:  POC, exercise form, HEP, and precautions/protocol.  PT answered pt's questions.  Person educated: Patient Education method: Explanation, Demonstration, verbal and tactile cues, handout Education comprehension: verbalized understanding, returned demonstration, verbal and tactile cues required  HOME EXERCISE PROGRAM: Access Code: [email or web address] URL: [email or web address]    Updated HEP: - Supine Shoulder Flexion AROM  - 2 x daily - 7 x weekly - 2 sets - 10 reps - Standing Shoulder Flexion AAROM with Dowel  - 2 x daily - 7 x weekly - 1-2 sets - 10 reps - Supine Single Arm Shoulder Protraction  - 1 x daily - 7 x weekly - 2 sets - 10 reps - Sidelying Shoulder External Rotation  - 1 x daily - 5 x weekly - 2 sets - 10 reps    ASSESSMENT:  CLINICAL IMPRESSION: Pt hasn't been to PT since 11/8 due to the clinic's schedule availability.  PT assessed ROM and pt is progressing well with PROM and AROM t/o R shoulder.  PT progressed exercises today and pt tolerated progression well.  She performed exercises per protocol well with cuing and instruction in correct form and had no c/o's.  PT updated HEP and gave pt a HEP handout.  Pt demonstrates good understanding of HEP.  She responded well to Rx having no increased pain after Rx.   She will cont to benefit from cont skilled PT services per protocol to improve ROM, address goals, and to assist in restoring desired level of function.   OBJECTIVE IMPAIRMENTS: decreased activity tolerance, decreased endurance, decreased mobility,  decreased ROM, decreased strength, impaired flexibility, impaired UE functional use, improper body mechanics, postural dysfunction, and pain.   ACTIVITY LIMITATIONS: carrying, lifting, bending, bed mobility, bathing, dressing, self feeding, reach over head, hygiene/grooming, locomotion level, and caring for others  PARTICIPATION LIMITATIONS: meal prep, cleaning, laundry, driving, shopping, community activity, and yard work  PERSONAL FACTORS: 3+ comorbidities: Hx cancer, fibromyalgia, HTN, HLD, RA, Hx CVA/TIA, DM  are also affecting patient's functional outcome.   REHAB POTENTIAL: Good  CLINICAL DECISION MAKING: Stable/uncomplicated  EVALUATION COMPLEXITY: Low   GOALS: Goals reviewed with patient? Yes  SHORT TERM GOALS: Target date: 05/25/2023    Patient will be independent with HEP in order to improve functional outcomes. Baseline: Goal status: GOAL MET  10/24  2.  Patient will report at least 25% improvement in symptoms for improved quality of life. Baseline:  Goal status: GOAL MET   10/24  3.  Patient will tolerate beginning to wean from sling with MD approval for improved functional use of RUE.  Baseline:  Goal status: INITIAL    LONG TERM GOALS: Target date: 07/20/2023    Patient will report at least 75% improvement in symptoms for improved quality of life. Baseline:  Goal status: INITIAL  2.  Patient will improve FOTO score by at least 32 points in order to indicate improved tolerance to activity. Baseline: 29% function Goal status: INITIAL  3.  Patient will demonstrate at least 150 degrees in shoulder AROM in flexion for improved ability lift overhead. Baseline:  Goal status: INITIAL  4.  Patient will demonstrate grade of at least 4+/5 MMT grade in all tested musculature as evidence of improved strength to assist with lifting at home. Baseline:  Goal status: INITIAL  5.  Patient will be progressing with resisted strengthening in order to build strength for  chores and exercise.  Baseline:  Goal status: INITIAL    PLAN:  PT FREQUENCY: 2x/wk  PT DURATION: 7 weeks  PLANNED INTERVENTIONS: Therapeutic exercises, Therapeutic activity, Neuromuscular re-education, Balance training, Gait training, Patient/Family education, Joint manipulation, Joint mobilization, Stair training, Orthotic/Fit training, DME instructions, Aquatic Therapy, Dry Needling, Electrical stimulation, Spinal manipulation, Spinal mobilization, Cryotherapy, Moist heat, Compression bandaging, scar mobilization, Splintting, Taping, Traction, Ultrasound, Ionotophoresis 4mg /ml Dexamethasone, and Manual therapy   PLAN FOR NEXT SESSION: Continue to progress per Dr. Serena Croissant RCR protocol.  Cont with PROM and AAROM per protocol.  Possibly add isometrics next visit.   Audie Clear III PT, DPT 07/12/23 9:49 AM

## 2023-07-11 ENCOUNTER — Encounter (HOSPITAL_BASED_OUTPATIENT_CLINIC_OR_DEPARTMENT_OTHER): Payer: Self-pay | Admitting: Physical Therapy

## 2023-07-11 ENCOUNTER — Ambulatory Visit (HOSPITAL_BASED_OUTPATIENT_CLINIC_OR_DEPARTMENT_OTHER): Payer: PPO | Attending: Orthopaedic Surgery | Admitting: Physical Therapy

## 2023-07-11 DIAGNOSIS — R29898 Other symptoms and signs involving the musculoskeletal system: Secondary | ICD-10-CM | POA: Diagnosis not present

## 2023-07-11 DIAGNOSIS — M25511 Pain in right shoulder: Secondary | ICD-10-CM | POA: Insufficient documentation

## 2023-07-11 DIAGNOSIS — S46011A Strain of muscle(s) and tendon(s) of the rotator cuff of right shoulder, initial encounter: Secondary | ICD-10-CM | POA: Insufficient documentation

## 2023-07-11 DIAGNOSIS — M6281 Muscle weakness (generalized): Secondary | ICD-10-CM | POA: Insufficient documentation

## 2023-07-14 ENCOUNTER — Encounter (HOSPITAL_BASED_OUTPATIENT_CLINIC_OR_DEPARTMENT_OTHER): Payer: Self-pay | Admitting: Physical Therapy

## 2023-07-14 ENCOUNTER — Ambulatory Visit (HOSPITAL_BASED_OUTPATIENT_CLINIC_OR_DEPARTMENT_OTHER): Payer: PPO | Admitting: Physical Therapy

## 2023-07-14 DIAGNOSIS — M6281 Muscle weakness (generalized): Secondary | ICD-10-CM

## 2023-07-14 DIAGNOSIS — M25511 Pain in right shoulder: Secondary | ICD-10-CM

## 2023-07-14 DIAGNOSIS — R29898 Other symptoms and signs involving the musculoskeletal system: Secondary | ICD-10-CM

## 2023-07-14 NOTE — Therapy (Signed)
OUTPATIENT PHYSICAL THERAPY SHOULDER TREATMENT      Patient Name: Misty Morris MRN: [id number] DOB:1951/07/15, 72 y.o., female Today's Date: 07/15/2023  END OF SESSION:  PT End of Session - 07/14/23 1321     Visit Number 17    Number of Visits 24    Date for PT Re-Evaluation 07/20/23    Authorization Type [employer]    PT Start Time 1317    PT Stop Time 1408    PT Time Calculation (min) 51 min    Activity Tolerance Patient tolerated treatment well    Behavior During Therapy WFL for tasks assessed/performed                     Past Medical History:  Diagnosis Date   Cancer (HCC)    hx of thyroid cancer   Fibromyalgia    Hypercholesterolemia    Hypertension    Hypothyroidism    thyroid removed   Polymyalgia rheumatica (HCC)    Rheumatoid arthritis (HCC)    "atypical/MD 08/2017" (02/14/2018)   Shingles    Stroke (HCC) 08/2017   "no residual" (02/14/2018)   TIA (transient ischemic attack) 02/14/2018   Type 2 diabetes, diet controlled (HCC)    Vision impairment    right eye   Past Surgical History:  Procedure Laterality Date   CATARACT EXTRACTION W/ INTRAOCULAR LENS  IMPLANT, BILATERAL     COLONOSCOPY WITH PROPOFOL N/A 10/12/2019   Procedure: COLONOSCOPY WITH PROPOFOL;  Surgeon: Jeani Hawking, MD;  Location: [facility];  Service: Endoscopy;  Laterality: N/A;   COLONOSCOPY WITH PROPOFOL N/A 12/24/2022   Procedure: COLONOSCOPY WITH PROPOFOL;  Surgeon: Jeani Hawking, MD;  Location: [facility];  Service: Gastroenterology;  Laterality: N/A;   HEMOSTASIS CLIP PLACEMENT  10/12/2019   Procedure: HEMOSTASIS CLIP PLACEMENT;  Surgeon: Jeani Hawking, MD;  Location: [facility];  Service: Endoscopy;;   JOINT REPLACEMENT     OPEN REDUCTION LE FORT I FRACTURE  1980s   PARTIAL KNEE ARTHROPLASTY Left 01/17/2017   Procedure: UNICOMPARTMENTAL LEFT KNEE;  Surgeon: Durene Romans, MD;  Location: [facility];  Service: Orthopedics;  Laterality: Left;   POLYPECTOMY  10/12/2019    Procedure: POLYPECTOMY;  Surgeon: Jeani Hawking, MD;  Location: [facility];  Service: Endoscopy;;   RADIOLOGY WITH ANESTHESIA N/A 08/11/2017   Procedure: MRI OF BRAIN WITH AND WITHOUT CONSTRAST, AND OF THE ORBIT WITH AND WITHOUT CONTRAST;  Surgeon: Radiologist, Medication, MD;  Location: [facility];  Service: Radiology;  Laterality: N/A;   RADIOLOGY WITH ANESTHESIA N/A 09/06/2017   Procedure: MRI OF BRAIN;  Surgeon: Radiologist, Medication, MD;  Location: [facility];  Service: Radiology;  Laterality: N/A;   RADIOLOGY WITH ANESTHESIA Right 03/03/2023   Procedure: MRI SHOULDER WITHOUT CONTRAST WITH ANESTHESIA;  Surgeon: Radiologist, Medication, MD;  Location: [facility];  Service: Radiology;  Laterality: Right;   RHINOPLASTY     SHOULDER ARTHROSCOPY WITH ROTATOR CUFF REPAIR Right 04/25/2023   Procedure: RIGHT SHOULDER ARTHROSCOPY WITH ROTATOR CUFF REPAIR, ACROMIOPLASTY;  Surgeon: Huel Cote, MD;  Location: [facility];  Service: Orthopedics;  Laterality: Right;   TOTAL KNEE ARTHROPLASTY Right 09/30/2015   Procedure: TOTAL RIGHT KNEE ARTHROPLASTY;  Surgeon: Durene Romans, MD;  Location: [facility];  Service: Orthopedics;  Laterality: Right;   VAGINAL HYSTERECTOMY     Patient Active Problem List   Diagnosis Date Noted   UTI (urinary tract infection) 10/07/2018   Sepsis (HCC) 10/07/2018   Hypokalemia 10/07/2018   S/P total thyroidectomy 10/07/2018   Rheumatoid arthritis (  HCC) 09/18/2018   Papillary thyroid carcinoma (HCC) 08/24/2018   TIA (transient ischemic attack) 02/14/2018   Hyperlipidemia    Ischemic optic neuropathy of right eye    Stroke (HCC) 09/04/2017   HTN (hypertension) 09/04/2017   DM (diabetes mellitus) (HCC) 09/04/2017   Polymyalgia rheumatica (HCC) 09/04/2017   Fibromyalgia 09/04/2017   S/P left UKR 01/17/2017   Overweight (BMI 25.0-29.9) 10/01/2015   S/P right TKA 09/30/2015    PCP: Irena Reichmann DO  REFERRING PROVIDER: Huel Cote, MD   REFERRING DIAG: [phone] (ICD-10-CM) -  Traumatic complete tear of right rotator cuff, initial encounter  THERAPY DIAG:  Right shoulder pain, unspecified chronicity  Muscle weakness (generalized)  Other symptoms and signs involving the musculoskeletal system  Rationale for Evaluation and Treatment: Rehabilitation  ONSET DATE: DOS 04/25/23  R RCR  SUBJECTIVE:                                                                                                                                                                                      SUBJECTIVE STATEMENT: Pt is 11 weeks and 3 days s/p R shoulder arthroscopic rotator cuff repair, extensive debridement, and subacromial decompression.  Pt states her PMR/fibromyalgia has been flared up this week.  Pt states her hands are bothering her and her L UE is worse than her R UE currently.  Pt denies any adverse effects after prior Rx.  Pt reports compliance with HEP and has had no issues with her new home exercises.     Hand dominance: Right  PERTINENT HISTORY: Hx cancer, fibromyalgia, PMR, HTN, HLD, RA, Hx CVA/TIA, DM  PAIN:  1-2/10 L shoulder Pt states her pain is about a 6-7/10 due to PMR/fibromyalgia.  Her pain is in bilat Ue's/hands.  Her L UE bothers her more than her R UE.   PRECAUTIONS: Shoulder R RCR  RED FLAGS: None   WEIGHT BEARING RESTRICTIONS:  NWB RUE  FALLS:  Has patient fallen in last 6 months? No   PLOF: Independent  PATIENT GOALS:get this arm working again    OBJECTIVE:  Observation:  pt has steri strips over portals/incision     TODAY'S TREATMENT:  Pt received R shoulder PROM flexion, abduction, ER, and IR per pt and tissue tolerance w/n protocol range.  Pt performed: Supine serratus punch 2x10 Supine shoulder flexion AROM 2 x 10 reps Supine shoulder ABC x 1 reps Standing wand flexion x 10 reps Standing  wand scaption 2x10 reps S/L ER 2x10 Bent over row 2x10 Shoulder submax isometrics in flex, abd, ext, ER, and IR x 5 reps each with 5 sec hold.  Pt performed 3 more reps of flex, abd, and extension.     Pt received a HEP handout and was educated in correct form and appropriate frequency.  PT instructed pt to not perform into a painful or tight range and she should not have pain with exercises.   Updated HEP: - Supine Shoulder Alphabet  - 1 x daily - 7 x weekly - 1 reps - Isometric Shoulder Flexion at Wall  - 1 x daily - 5 x weekly - 1 sets - 10 reps - 5 seconds hold - Isometric Shoulder Abduction at Wall  - 1 x daily - 5 x weekly - 1 sets - 10 reps - 5 seconds hold - Isometric Shoulder Extension at Wall  - 1 x daily - 5 x weekly - 1 sets - 10 reps - 5 second hold   PATIENT EDUCATION:  Education details:  POC, exercise form, HEP, and precautions/protocol.  PT answered pt's questions.  Person educated: Patient Education method: Explanation, Demonstration, verbal and tactile cues, handout Education comprehension: verbalized understanding, returned demonstration, verbal and tactile cues required  HOME EXERCISE PROGRAM: Access Code: [email or web address] URL: [email or web address]    Updated HEP: - Supine Shoulder Flexion AROM  - 2 x daily - 7 x weekly - 2 sets - 10 reps - Standing Shoulder Flexion AAROM with Dowel  - 2 x daily - 7 x weekly - 1-2 sets - 10 reps - Supine Single Arm Shoulder Protraction  - 1 x daily - 7 x weekly - 2 sets - 10 reps - Sidelying Shoulder External Rotation  - 1 x daily - 5 x weekly - 2 sets - 10 reps    ASSESSMENT:  CLINICAL IMPRESSION: Pt states her PMR/fibromyalgia has flared up.  Her L UE is bothering her more than her R shoulder.  Pt tolerated PROM well and is progressing well with ROM t/o R shoulder.  PT reviewed HEP and updated HEP.  Pt performed exercises per protocol well with cuing and instruction in correct form and had no c/o's.  Pt tolerated submax  isometrics well without c/o's.  She demonstrates good understanding of HEP.  She responded well to Rx having no increased pain after Rx.   She will cont to benefit from cont skilled PT services per protocol to improve ROM, address goals, and to assist in restoring desired level of function.   OBJECTIVE IMPAIRMENTS: decreased activity tolerance, decreased endurance, decreased mobility, decreased ROM, decreased strength, impaired flexibility, impaired UE functional use, improper body mechanics, postural dysfunction, and pain.   ACTIVITY LIMITATIONS: carrying, lifting, bending, bed mobility, bathing, dressing, self feeding, reach over head, hygiene/grooming, locomotion level, and caring for others  PARTICIPATION LIMITATIONS: meal prep, cleaning, laundry, driving, shopping, community activity, and yard work  PERSONAL FACTORS: 3+ comorbidities: Hx cancer, fibromyalgia, HTN, HLD, RA, Hx CVA/TIA, DM  are also affecting patient's functional outcome.   REHAB POTENTIAL: Good  CLINICAL DECISION MAKING: Stable/uncomplicated  EVALUATION COMPLEXITY: Low   GOALS: Goals reviewed with patient? Yes  SHORT TERM GOALS: Target date: 05/25/2023  Patient will be independent with HEP in order to improve functional outcomes. Baseline: Goal status: GOAL MET  10/24  2.  Patient will report at least 25% improvement in symptoms for improved quality of life. Baseline:  Goal status: GOAL MET   10/24  3.  Patient will tolerate beginning to wean from sling with MD approval for improved functional use of RUE.  Baseline:  Goal status: INITIAL    LONG TERM GOALS: Target date: 07/20/2023    Patient will report at least 75% improvement in symptoms for improved quality of life. Baseline:  Goal status: INITIAL  2.  Patient will improve FOTO score by at least 32 points in order to indicate improved tolerance to activity. Baseline: 29% function Goal status: INITIAL  3.  Patient will demonstrate at least 150  degrees in shoulder AROM in flexion for improved ability lift overhead. Baseline:  Goal status: INITIAL  4.  Patient will demonstrate grade of at least 4+/5 MMT grade in all tested musculature as evidence of improved strength to assist with lifting at home. Baseline:  Goal status: INITIAL  5.  Patient will be progressing with resisted strengthening in order to build strength for chores and exercise.  Baseline:  Goal status: INITIAL    PLAN:  PT FREQUENCY: 2x/wk  PT DURATION: 7 weeks  PLANNED INTERVENTIONS: Therapeutic exercises, Therapeutic activity, Neuromuscular re-education, Balance training, Gait training, Patient/Family education, Joint manipulation, Joint mobilization, Stair training, Orthotic/Fit training, DME instructions, Aquatic Therapy, Dry Needling, Electrical stimulation, Spinal manipulation, Spinal mobilization, Cryotherapy, Moist heat, Compression bandaging, scar mobilization, Splintting, Taping, Traction, Ultrasound, Ionotophoresis 4mg /ml Dexamethasone, and Manual therapy   PLAN FOR NEXT SESSION: Continue to progress per Dr. Serena Croissant RCR protocol.  Cont with PROM and AAROM per protocol.  Possibly add isometrics next visit.   Audie Clear III PT, DPT 07/15/23 7:47 PM

## 2023-07-17 NOTE — Therapy (Signed)
OUTPATIENT PHYSICAL THERAPY SHOULDER TREATMENT      Patient Name: Misty Morris MRN: [id number] DOB:April 29, 1951, 72 y.o., female Today's Date: 07/17/2023  END OF SESSION:            Past Medical History:  Diagnosis Date   Cancer (HCC)    hx of thyroid cancer   Fibromyalgia    Hypercholesterolemia    Hypertension    Hypothyroidism    thyroid removed   Polymyalgia rheumatica (HCC)    Rheumatoid arthritis (HCC)    "atypical/MD 08/2017" (02/14/2018)   Shingles    Stroke (HCC) 08/2017   "no residual" (02/14/2018)   TIA (transient ischemic attack) 02/14/2018   Type 2 diabetes, diet controlled (HCC)    Vision impairment    right eye   Past Surgical History:  Procedure Laterality Date   CATARACT EXTRACTION W/ INTRAOCULAR LENS  IMPLANT, BILATERAL     COLONOSCOPY WITH PROPOFOL N/A 10/12/2019   Procedure: COLONOSCOPY WITH PROPOFOL;  Surgeon: Jeani Hawking, MD;  Location: [facility];  Service: Endoscopy;  Laterality: N/A;   COLONOSCOPY WITH PROPOFOL N/A 12/24/2022   Procedure: COLONOSCOPY WITH PROPOFOL;  Surgeon: Jeani Hawking, MD;  Location: [facility];  Service: Gastroenterology;  Laterality: N/A;   HEMOSTASIS CLIP PLACEMENT  10/12/2019   Procedure: HEMOSTASIS CLIP PLACEMENT;  Surgeon: Jeani Hawking, MD;  Location: [facility];  Service: Endoscopy;;   JOINT REPLACEMENT     OPEN REDUCTION LE FORT I FRACTURE  1980s   PARTIAL KNEE ARTHROPLASTY Left 01/17/2017   Procedure: UNICOMPARTMENTAL LEFT KNEE;  Surgeon: Durene Romans, MD;  Location: [facility];  Service: Orthopedics;  Laterality: Left;   POLYPECTOMY  10/12/2019   Procedure: POLYPECTOMY;  Surgeon: Jeani Hawking, MD;  Location: [facility];  Service: Endoscopy;;   RADIOLOGY WITH ANESTHESIA N/A 08/11/2017   Procedure: MRI OF BRAIN WITH AND WITHOUT CONSTRAST, AND OF THE ORBIT WITH AND WITHOUT CONTRAST;  Surgeon: Radiologist, Medication, MD;  Location: [facility];  Service: Radiology;  Laterality: N/A;   RADIOLOGY WITH ANESTHESIA N/A  09/06/2017   Procedure: MRI OF BRAIN;  Surgeon: Radiologist, Medication, MD;  Location: [facility];  Service: Radiology;  Laterality: N/A;   RADIOLOGY WITH ANESTHESIA Right 03/03/2023   Procedure: MRI SHOULDER WITHOUT CONTRAST WITH ANESTHESIA;  Surgeon: Radiologist, Medication, MD;  Location: [facility];  Service: Radiology;  Laterality: Right;   RHINOPLASTY     SHOULDER ARTHROSCOPY WITH ROTATOR CUFF REPAIR Right 04/25/2023   Procedure: RIGHT SHOULDER ARTHROSCOPY WITH ROTATOR CUFF REPAIR, ACROMIOPLASTY;  Surgeon: Huel Cote, MD;  Location: [facility];  Service: Orthopedics;  Laterality: Right;   TOTAL KNEE ARTHROPLASTY Right 09/30/2015   Procedure: TOTAL RIGHT KNEE ARTHROPLASTY;  Surgeon: Durene Romans, MD;  Location: [facility];  Service: Orthopedics;  Laterality: Right;   VAGINAL HYSTERECTOMY     Patient Active Problem List   Diagnosis Date Noted   UTI (urinary tract infection) 10/07/2018   Sepsis (HCC) 10/07/2018   Hypokalemia 10/07/2018   S/P total thyroidectomy 10/07/2018   Rheumatoid arthritis (HCC) 09/18/2018   Papillary thyroid carcinoma (HCC) 08/24/2018   TIA (transient ischemic attack) 02/14/2018   Hyperlipidemia    Ischemic optic neuropathy of right eye    Stroke (HCC) 09/04/2017   HTN (hypertension) 09/04/2017   DM (diabetes mellitus) (HCC) 09/04/2017   Polymyalgia rheumatica (HCC) 09/04/2017   Fibromyalgia 09/04/2017   S/P left UKR 01/17/2017   Overweight (BMI 25.0-29.9) 10/01/2015   S/P right TKA 09/30/2015    PCP: Irena Reichmann DO  REFERRING PROVIDER: Huel Cote, MD  REFERRING DIAG: S46.011A (ICD-10-CM) - Traumatic complete tear of right rotator cuff, initial encounter  THERAPY DIAG:  No diagnosis found.  Rationale for Evaluation and Treatment: Rehabilitation  ONSET DATE: DOS 04/25/23  R RCR  SUBJECTIVE:                                                                                                                                                                                       SUBJECTIVE STATEMENT: Pt is 12 weeks s/p R shoulder arthroscopic rotator cuff repair, extensive debridement, and subacromial decompression.  Pt states her PMR/fibromyalgia has been flared up this week.  Pt states her hands are bothering her and her L UE is worse than her R UE currently.  Pt denies any adverse effects after prior Rx.  Pt reports compliance with HEP and has had no issues with her new home exercises.     Hand dominance: Right  PERTINENT HISTORY: Hx cancer, fibromyalgia, PMR, HTN, HLD, RA, Hx CVA/TIA, DM  PAIN:  1-2/10 L shoulder Pt states her pain is about a 6-7/10 due to PMR/fibromyalgia.  Her pain is in bilat Ue's/hands.  Her L UE bothers her more than her R UE.   PRECAUTIONS: Shoulder R RCR  RED FLAGS: None   WEIGHT BEARING RESTRICTIONS:  NWB RUE  FALLS:  Has patient fallen in last 6 months? No   PLOF: Independent  PATIENT GOALS:get this arm working again    OBJECTIVE:  Observation:  pt has steri strips over portals/incision     TODAY'S TREATMENT:                                                                                                                                          Pt received R shoulder PROM flexion, abduction, ER, and IR per pt and tissue tolerance w/n protocol range.  Pt performed: Supine serratus punch 2x10 Supine shoulder flexion AROM 2 x 10 reps Supine shoulder ABC x 1 reps Standing wand flexion x 10 reps Standing wand scaption 2x10 reps S/L ER 2x10 Bent over row  2x10 Shoulder submax isometrics in flex, abd, ext, ER, and IR x 5 reps each with 5 sec hold.  Pt performed 3 more reps of flex, abd, and extension.     Pt received a HEP handout and was educated in correct form and appropriate frequency.  PT instructed pt to not perform into a painful or tight range and she should not have pain with exercises.   Updated HEP: - Supine Shoulder Alphabet  - 1 x daily - 7 x weekly - 1 reps - Isometric Shoulder Flexion at  Wall  - 1 x daily - 5 x weekly - 1 sets - 10 reps - 5 seconds hold - Isometric Shoulder Abduction at Wall  - 1 x daily - 5 x weekly - 1 sets - 10 reps - 5 seconds hold - Isometric Shoulder Extension at Wall  - 1 x daily - 5 x weekly - 1 sets - 10 reps - 5 second hold   PATIENT EDUCATION:  Education details:  POC, exercise form, HEP, and precautions/protocol.  PT answered pt's questions.  Person educated: Patient Education method: Explanation, Demonstration, verbal and tactile cues, handout Education comprehension: verbalized understanding, returned demonstration, verbal and tactile cues required  HOME EXERCISE PROGRAM: Access Code: [email or web address] URL: [email or web address]    Updated HEP: - Supine Shoulder Flexion AROM  - 2 x daily - 7 x weekly - 2 sets - 10 reps - Standing Shoulder Flexion AAROM with Dowel  - 2 x daily - 7 x weekly - 1-2 sets - 10 reps - Supine Single Arm Shoulder Protraction  - 1 x daily - 7 x weekly - 2 sets - 10 reps - Sidelying Shoulder External Rotation  - 1 x daily - 5 x weekly - 2 sets - 10 reps    ASSESSMENT:  CLINICAL IMPRESSION: Pt states her PMR/fibromyalgia has flared up.  Her L UE is bothering her more than her R shoulder.  Pt tolerated PROM well and is progressing well with ROM t/o R shoulder.  PT reviewed HEP and updated HEP.  Pt performed exercises per protocol well with cuing and instruction in correct form and had no c/o's.  Pt tolerated submax isometrics well without c/o's.  She demonstrates good understanding of HEP.  She responded well to Rx having no increased pain after Rx.   She will cont to benefit from cont skilled PT services per protocol to improve ROM, address goals, and to assist in restoring desired level of function.   OBJECTIVE IMPAIRMENTS: decreased activity tolerance, decreased endurance, decreased mobility, decreased ROM, decreased strength, impaired flexibility, impaired UE functional use, improper body mechanics, postural  dysfunction, and pain.   ACTIVITY LIMITATIONS: carrying, lifting, bending, bed mobility, bathing, dressing, self feeding, reach over head, hygiene/grooming, locomotion level, and caring for others  PARTICIPATION LIMITATIONS: meal prep, cleaning, laundry, driving, shopping, community activity, and yard work  PERSONAL FACTORS: 3+ comorbidities: Hx cancer, fibromyalgia, HTN, HLD, RA, Hx CVA/TIA, DM  are also affecting patient's functional outcome.   REHAB POTENTIAL: Good  CLINICAL DECISION MAKING: Stable/uncomplicated  EVALUATION COMPLEXITY: Low   GOALS: Goals reviewed with patient? Yes  SHORT TERM GOALS: Target date: 05/25/2023    Patient will be independent with HEP in order to improve functional outcomes. Baseline: Goal status: GOAL MET  10/24  2.  Patient will report at least 25% improvement in symptoms for improved quality of life. Baseline:  Goal status: GOAL MET   10/24  3.  Patient will tolerate beginning to wean from sling  with MD approval for improved functional use of RUE.  Baseline:  Goal status: INITIAL    LONG TERM GOALS: Target date: 07/20/2023    Patient will report at least 75% improvement in symptoms for improved quality of life. Baseline:  Goal status: INITIAL  2.  Patient will improve FOTO score by at least 32 points in order to indicate improved tolerance to activity. Baseline: 29% function Goal status: INITIAL  3.  Patient will demonstrate at least 150 degrees in shoulder AROM in flexion for improved ability lift overhead. Baseline:  Goal status: INITIAL  4.  Patient will demonstrate grade of at least 4+/5 MMT grade in all tested musculature as evidence of improved strength to assist with lifting at home. Baseline:  Goal status: INITIAL  5.  Patient will be progressing with resisted strengthening in order to build strength for chores and exercise.  Baseline:  Goal status: INITIAL    PLAN:  PT FREQUENCY: 2x/wk  PT DURATION: 7  weeks  PLANNED INTERVENTIONS: Therapeutic exercises, Therapeutic activity, Neuromuscular re-education, Balance training, Gait training, Patient/Family education, Joint manipulation, Joint mobilization, Stair training, Orthotic/Fit training, DME instructions, Aquatic Therapy, Dry Needling, Electrical stimulation, Spinal manipulation, Spinal mobilization, Cryotherapy, Moist heat, Compression bandaging, scar mobilization, Splintting, Taping, Traction, Ultrasound, Ionotophoresis 4mg /ml Dexamethasone, and Manual therapy   PLAN FOR NEXT SESSION: Continue to progress per Dr. Serena Croissant RCR protocol.  Cont with PROM and AAROM per protocol.  Possibly add isometrics next visit.   Audie Clear III PT, DPT 07/17/23 9:08 AM

## 2023-07-18 ENCOUNTER — Ambulatory Visit (HOSPITAL_BASED_OUTPATIENT_CLINIC_OR_DEPARTMENT_OTHER): Payer: PPO | Admitting: Physical Therapy

## 2023-07-18 ENCOUNTER — Encounter (HOSPITAL_BASED_OUTPATIENT_CLINIC_OR_DEPARTMENT_OTHER): Payer: Self-pay | Admitting: Physical Therapy

## 2023-07-18 DIAGNOSIS — R29898 Other symptoms and signs involving the musculoskeletal system: Secondary | ICD-10-CM

## 2023-07-18 DIAGNOSIS — M25511 Pain in right shoulder: Secondary | ICD-10-CM | POA: Diagnosis not present

## 2023-07-18 DIAGNOSIS — M6281 Muscle weakness (generalized): Secondary | ICD-10-CM

## 2023-07-21 ENCOUNTER — Ambulatory Visit (HOSPITAL_BASED_OUTPATIENT_CLINIC_OR_DEPARTMENT_OTHER): Payer: PPO | Admitting: Physical Therapy

## 2023-07-21 ENCOUNTER — Ambulatory Visit (HOSPITAL_BASED_OUTPATIENT_CLINIC_OR_DEPARTMENT_OTHER): Payer: PPO | Admitting: Orthopaedic Surgery

## 2023-07-21 DIAGNOSIS — M25511 Pain in right shoulder: Secondary | ICD-10-CM

## 2023-07-21 DIAGNOSIS — M6281 Muscle weakness (generalized): Secondary | ICD-10-CM

## 2023-07-21 DIAGNOSIS — R29898 Other symptoms and signs involving the musculoskeletal system: Secondary | ICD-10-CM

## 2023-07-21 DIAGNOSIS — S46011A Strain of muscle(s) and tendon(s) of the rotator cuff of right shoulder, initial encounter: Secondary | ICD-10-CM

## 2023-07-21 NOTE — Therapy (Signed)
OUTPATIENT PHYSICAL THERAPY SHOULDER TREATMENT / Progress note  Progress Note Reporting Period 06/04/2023 to 07/21/2023  See note below for Objective Data and Assessment of Progress/Goals.         Patient Name: Misty Morris MRN: [id number] DOB:15-Aug-1950, 72 y.o., female Today's Date: 07/22/2023  END OF SESSION:  PT End of Session - 07/21/23 1323     Visit Number 19    Number of Visits 29    Date for PT Re-Evaluation 08/25/23    Authorization Type [employer]    PT Start Time 1319    PT Stop Time 1403    PT Time Calculation (min) 44 min    Activity Tolerance Patient tolerated treatment well    Behavior During Therapy WFL for tasks assessed/performed                  Past Medical History:  Diagnosis Date   Cancer (HCC)    hx of thyroid cancer   Fibromyalgia    Hypercholesterolemia    Hypertension    Hypothyroidism    thyroid removed   Polymyalgia rheumatica (HCC)    Rheumatoid arthritis (HCC)    "atypical/MD 08/2017" (02/14/2018)   Shingles    Stroke (HCC) 08/2017   "no residual" (02/14/2018)   TIA (transient ischemic attack) 02/14/2018   Type 2 diabetes, diet controlled (HCC)    Vision impairment    right eye   Past Surgical History:  Procedure Laterality Date   CATARACT EXTRACTION W/ INTRAOCULAR LENS  IMPLANT, BILATERAL     COLONOSCOPY WITH PROPOFOL N/A 10/12/2019   Procedure: COLONOSCOPY WITH PROPOFOL;  Surgeon: Jeani Hawking, MD;  Location: [facility];  Service: Endoscopy;  Laterality: N/A;   COLONOSCOPY WITH PROPOFOL N/A 12/24/2022   Procedure: COLONOSCOPY WITH PROPOFOL;  Surgeon: Jeani Hawking, MD;  Location: [facility];  Service: Gastroenterology;  Laterality: N/A;   HEMOSTASIS CLIP PLACEMENT  10/12/2019   Procedure: HEMOSTASIS CLIP PLACEMENT;  Surgeon: Jeani Hawking, MD;  Location: [facility];  Service: Endoscopy;;   JOINT REPLACEMENT     OPEN REDUCTION LE FORT I FRACTURE  1980s   PARTIAL KNEE ARTHROPLASTY Left 01/17/2017    Procedure: UNICOMPARTMENTAL LEFT KNEE;  Surgeon: Durene Romans, MD;  Location: [facility];  Service: Orthopedics;  Laterality: Left;   POLYPECTOMY  10/12/2019   Procedure: POLYPECTOMY;  Surgeon: Jeani Hawking, MD;  Location: [facility];  Service: Endoscopy;;   RADIOLOGY WITH ANESTHESIA N/A 08/11/2017   Procedure: MRI OF BRAIN WITH AND WITHOUT CONSTRAST, AND OF THE ORBIT WITH AND WITHOUT CONTRAST;  Surgeon: Radiologist, Medication, MD;  [facility]: [facility];  Service: Radiology;  Laterality: N/A;   RADIOLOGY WITH ANESTHESIA N/A 09/06/2017   Procedure: MRI OF BRAIN;  Surgeon: Radiologist, Medication, MD;  [facility];  Service: Radiology;  Laterality: N/A;   RADIOLOGY WITH ANESTHESIA Right 03/03/2023   Procedure: MRI SHOULDER WITHOUT CONTRAST WITH ANESTHESIA;  Surgeon: Radiologist, Medication, MD;  Location: [facility];  Service: Radiology;  Laterality: Right;   RHINOPLASTY     SHOULDER ARTHROSCOPY WITH ROTATOR CUFF REPAIR Right 04/25/2023   Procedure: RIGHT SHOULDER ARTHROSCOPY WITH ROTATOR CUFF REPAIR, ACROMIOPLASTY;  Surgeon: Huel Cote, MD;  Location: [facility];  Service: Orthopedics;  Laterality: Right;   TOTAL KNEE ARTHROPLASTY Right 09/30/2015   Procedure: TOTAL RIGHT KNEE ARTHROPLASTY;  Surgeon: Durene Romans, MD;  Location: [facility];  Service: Orthopedics;  Laterality: Right;   VAGINAL HYSTERECTOMY     Patient Active Problem List   Diagnosis Date Noted   UTI (urinary  tract infection) 10/07/2018   Sepsis (HCC) 10/07/2018   Hypokalemia 10/07/2018   S/P total thyroidectomy 10/07/2018   Rheumatoid arthritis (HCC) 09/18/2018   Papillary thyroid carcinoma (HCC) 08/24/2018   TIA (transient ischemic attack) 02/14/2018   Hyperlipidemia    Ischemic optic neuropathy of right eye    Stroke (HCC) 09/04/2017   HTN (hypertension) 09/04/2017   DM (diabetes mellitus) (HCC) 09/04/2017   Polymyalgia rheumatica (HCC) 09/04/2017   Fibromyalgia 09/04/2017   S/P left UKR 01/17/2017   Overweight (BMI 25.0-29.9)  10/01/2015   S/P right TKA 09/30/2015    PCP: Irena Reichmann DO  REFERRING PROVIDER: Huel Cote, MD   REFERRING DIAG: [phone] (ICD-10-CM) - Traumatic complete tear of right rotator cuff, initial encounter  THERAPY DIAG:  Right shoulder pain, unspecified chronicity  Muscle weakness (generalized)  Other symptoms and signs involving the musculoskeletal system  Rationale for Evaluation and Treatment: Rehabilitation  ONSET DATE: DOS 04/25/23  R RCR  SUBJECTIVE:                                                                                                                                                                                      SUBJECTIVE STATEMENT: Pt is 12 weeks and 3 days s/p R shoulder arthroscopic rotator cuff repair, extensive debridement, and subacromial decompression.  Pt saw MD this AM and he was pleased with progress.  Pt states MD checked her reaching overhead and behind her back.  Pt states MD instructed her that she is able to lift a bag of groceries, and didn't provide any specific restrictions.  Pt reports MD thought she could be done with PT the 1st week of January.  states her PMR/fibromyalgia has been flared up and she was having terrible pain yesterday.  Pt states she is feeling better today.  Pt denies any adverse effects after prior Rx.  Pt reports compliance with HEP and has had no issues with her new home exercises.    FUNCTIONAL IMPROVEMENTS:  dressing, bathing, washing hair, washing dishes, laundry, reaching FUNCTIONAL LIMITATIONS:  household chores, opening/closing doors, brushing hair, reaching overhead   Hand dominance: Right  PERTINENT HISTORY: Hx cancer, fibromyalgia, PMR, HTN, HLD, RA, Hx CVA/TIA, DM  PAIN:  1-2/10 current R shoulder 3/10 worst pain 0/10 best   PRECAUTIONS: Shoulder R RCR  RED FLAGS: None   WEIGHT BEARING RESTRICTIONS:  NWB RUE  FALLS:  Has patient fallen in last 6 months? No   PLOF: Independent  PATIENT  GOALS:get this arm working again    OBJECTIVE:    TODAY'S TREATMENT:  R shoulder ROM: PROM:  flex:  163, Abd:  135, ER:  72, IR:  70 deg AROM:  Flexion: standing 159 deg, [place]:  161 deg   ER:  71 deg, IR:  66 deg  Pt received R shoulder PROM flexion, abduction, ER, and IR per pt and tissue tolerance w/n protocol range.  Pt performed: Supine serratus punch 2x10 with 1# Supine shoulder ABC x 1 reps S/L ER 2x10 Standing rows with retraction with YTB 2x10 Standing wand IR 2x10     FOTO:  Prior/Current:  36 / 57. Goal of 61   PATIENT EDUCATION:  Education details:  POC, exercise form, HEP, ROM findings, goal progress, and precautions/protocol.  PT answered pt's questions.  Person educated: Patient Education method: Explanation, Demonstration, verbal and tactile cues, handout Education comprehension: verbalized understanding, returned demonstration, verbal and tactile cues required  HOME EXERCISE PROGRAM: Access Code: [email or web address] URL: [email or web address]    Updated HEP: - Standing Isometric Shoulder External Rotation with Doorway  - 1 x daily - 5 x weekly - 1 sets - 10 reps - 5 seconds hold - Standing Isometric Shoulder Internal Rotation at Doorway  - 1 x daily - 5 x weekly - 1 sets - 10 reps - 5 seconds hold    ASSESSMENT:  CLINICAL IMPRESSION: Pt is making great progress in PT.  She has progressed well with PROM/AAROM/AROM and is progressing well with protocol.  Pt demonstrates great AROM t/o R shoulder.  She is able to actively elevate R UE well overhead with good form.  Pt is improving with function as evidenced by subjective reports of improved performance of self care activities and IADLs including dressing, bathing, washing dishes, and performing laundry.  She has improved with reaching and has expected limitations  including  performing household chores and overhead activities.  Pt has met all STG's and LTG # 3. Pt demonstrates improved self perceived disability with FOTO score improving from 36 to 57.  She responded well to Rx having no increased pain and no c/o's after Rx.   She will cont to benefit from cont skilled PT services per protocol to improve ROM, address goals, and to assist in restoring desired level of function.     OBJECTIVE IMPAIRMENTS: decreased activity tolerance, decreased endurance, decreased mobility, decreased ROM, decreased strength, impaired flexibility, impaired UE functional use, improper body mechanics, postural dysfunction, and pain.   ACTIVITY LIMITATIONS: carrying, lifting, bending, bed mobility, bathing, dressing, self feeding, reach over head, hygiene/grooming, locomotion level, and caring for others  PARTICIPATION LIMITATIONS: meal prep, cleaning, laundry, driving, shopping, community activity, and yard work  PERSONAL FACTORS: 3+ comorbidities: Hx cancer, fibromyalgia, HTN, HLD, RA, Hx CVA/TIA, DM  are also affecting patient's functional outcome.   REHAB POTENTIAL: Good  CLINICAL DECISION MAKING: Stable/uncomplicated  EVALUATION COMPLEXITY: Low   GOALS: Goals reviewed with patient? Yes  SHORT TERM GOALS: Target date: 05/25/2023    Patient will be independent with HEP in order to improve functional outcomes. Baseline: Goal status: GOAL MET  10/24  2.  Patient will report at least 25% improvement in symptoms for improved quality of life. Baseline:  Goal status: GOAL MET   10/24  3.  Patient will tolerate beginning to wean from sling with MD approval for improved functional use of RUE.  Baseline:  Goal status: goal met    LONG TERM GOALS: Target date: 08/25/2023    Patient will report at least 75% improvement in symptoms for improved quality of life. Baseline:  Goal status  ONGOING  2.  Patient will improve FOTO score by at least 32 points in order to  indicate improved tolerance to activity. Baseline: 57 Goal status: progressing  12/12  3.  Patient will demonstrate at least 150 degrees in shoulder AROM in flexion for improved ability lift overhead. Baseline:  Goal status: goal met  12/12  4.  Patient will demonstrate grade of at least 4+/5 MMT grade in all tested musculature as evidence of improved strength to assist with lifting at home. Baseline:  Goal status: INITIAL  5.  Patient will be progressing with resisted strengthening in order to build strength for chores and exercise.  Baseline:  Goal status: INITIAL    PLAN:  PT FREQUENCY: 2x/wk  PT DURATION: 7 weeks  PLANNED INTERVENTIONS: Therapeutic exercises, Therapeutic activity, Neuromuscular re-education, Balance training, Gait training, Patient/Family education, Joint manipulation, Joint mobilization, Stair training, Orthotic/Fit training, DME instructions, Aquatic Therapy, Dry Needling, Electrical stimulation, Spinal manipulation, Spinal mobilization, Cryotherapy, Moist heat, Compression bandaging, scar mobilization, Splintting, Taping, Traction, Ultrasound, Ionotophoresis 4mg /ml Dexamethasone, and Manual therapy   PLAN FOR NEXT SESSION: Continue to progress per Dr. Serena Croissant RCR protocol.    Audie Clear III PT, DPT 07/22/23 3:56 PM

## 2023-07-21 NOTE — Progress Notes (Signed)
Post Operative Evaluation    Procedure/Date of Surgery: Right shoulder rotator cuff repair 9/16  Interval History:   Presents today 12 weeks status post right shoulder rotator cuff repair.  Overall she is doing extremely well.  Range of motion is now normalized.  She does feel stronger.  Only very mild pain.  PMH/PSH/Family History/Social History/Meds/Allergies:    Past Medical History:  Diagnosis Date   Cancer (HCC)    hx of thyroid cancer   Fibromyalgia    Hypercholesterolemia    Hypertension    Hypothyroidism    thyroid removed   Polymyalgia rheumatica (HCC)    Rheumatoid arthritis (HCC)    "atypical/MD 08/2017" (02/14/2018)   Shingles    Stroke (HCC) 08/2017   "no residual" (02/14/2018)   TIA (transient ischemic attack) 02/14/2018   Type 2 diabetes, diet controlled (HCC)    Vision impairment    right eye   Past Surgical History:  Procedure Laterality Date   CATARACT EXTRACTION W/ INTRAOCULAR LENS  IMPLANT, BILATERAL     COLONOSCOPY WITH PROPOFOL N/A 10/12/2019   Procedure: COLONOSCOPY WITH PROPOFOL;  Surgeon: Jeani Hawking, MD;  Location: [facility];  Service: Endoscopy;  Laterality: N/A;   COLONOSCOPY WITH PROPOFOL N/A 12/24/2022   Procedure: COLONOSCOPY WITH PROPOFOL;  Surgeon: Jeani Hawking, MD;  Location: [facility];  Service: Gastroenterology;  Laterality: N/A;   HEMOSTASIS CLIP PLACEMENT  10/12/2019   Procedure: HEMOSTASIS CLIP PLACEMENT;  Surgeon: Jeani Hawking, MD;  Location: [facility];  Service: Endoscopy;;   JOINT REPLACEMENT     OPEN REDUCTION LE FORT I FRACTURE  1980s   PARTIAL KNEE ARTHROPLASTY Left 01/17/2017   Procedure: UNICOMPARTMENTAL LEFT KNEE;  Surgeon: Durene Romans, MD;  Location: [facility];  Service: Orthopedics;  Laterality: Left;   POLYPECTOMY  10/12/2019   Procedure: POLYPECTOMY;  Surgeon: Jeani Hawking, MD;  Location: [facility];  Service: Endoscopy;;   RADIOLOGY WITH ANESTHESIA N/A 08/11/2017   Procedure: MRI  OF BRAIN WITH AND WITHOUT CONSTRAST, AND OF THE ORBIT WITH AND WITHOUT CONTRAST;  Surgeon: Radiologist, Medication, MD;  Location: [facility];  Service: Radiology;  Laterality: N/A;   RADIOLOGY WITH ANESTHESIA N/A 09/06/2017   Procedure: MRI OF BRAIN;  Surgeon: Radiologist, Medication, MD;  Location: [facility];  Service: Radiology;  Laterality: N/A;   RADIOLOGY WITH ANESTHESIA Right 03/03/2023   Procedure: MRI SHOULDER WITHOUT CONTRAST WITH ANESTHESIA;  Surgeon: Radiologist, Medication, MD;  Location: [facility];  Service: Radiology;  Laterality: Right;   RHINOPLASTY     SHOULDER ARTHROSCOPY WITH ROTATOR CUFF REPAIR Right 04/25/2023   Procedure: RIGHT SHOULDER ARTHROSCOPY WITH ROTATOR CUFF REPAIR, ACROMIOPLASTY;  Surgeon: Huel Cote, MD;  Location: [facility];  Service: Orthopedics;  Laterality: Right;   TOTAL KNEE ARTHROPLASTY Right 09/30/2015   Procedure: TOTAL RIGHT KNEE ARTHROPLASTY;  Surgeon: Durene Romans, MD;  Location: [facility];  Service: Orthopedics;  Laterality: Right;   VAGINAL HYSTERECTOMY     Social History   Socioeconomic History   Marital status: Single    Spouse name: Not on file   Number of children: 0   Years of education: Not on file   Highest education level: Not on file  Occupational History   Not on file  Tobacco Use   Smoking status: Former    Current packs/day: 0.00    Average packs/day: 1  pack/day for 25.0 years (25.0 ttl pk-yrs)    Types: Cigarettes    Start date: 12/27/1986    Quit date: 12/27/2011    Years since quitting: 11.5   Smokeless tobacco: Never  Vaping Use   Vaping status: Never Used  Substance and Sexual Activity   Alcohol use: Never   Drug use: Yes    Types: Marijuana    Comment: last smoked 03/28/23   Sexual activity: Not Currently  Other Topics Concern   Not on file  Social History Narrative   Not on file   Social Drivers of Health   Financial Resource Strain: Not on file  Food Insecurity: Not on file  Transportation Needs: Not on file  Physical  Activity: Not on file  Stress: Not on file  Social Connections: Not on file   Family History  Problem Relation Age of Onset   Hypertension Mother    Seizures Mother    Hypertension Father    Hypertension Sister    Pancreatic cancer Other    Allergies  Allergen Reactions   Codeine Hives   Demerol [Meperidine] Hives   Latex Dermatitis and Other (See Comments)    Blistering  Other Reaction(s): Unknown   Morphine And Codeine Hives   Sulfa Antibiotics Hives   Tramadol Hives   Vicodin [Hydrocodone-Acetaminophen] Itching    Elevated blood pressure. Tolerates low doses.   Adalimumab     Other Reaction(s): hair loss   Current Outpatient Medications  Medication Sig Dispense Refill   ACETYLCARN-ALPHA LIPOIC ACID PO Take 1 capsule by mouth 2 (two) times daily.     amLODipine (NORVASC) 5 MG tablet Take 5 mg by mouth every evening.   2   Calcium-Magnesium-Vitamin D (CALCIUM 1200+D3 PO) Take 1 tablet by mouth every evening.     Cholecalciferol (VITAMIN D) 50 MCG (2000 UT) tablet Take 2,000 Units by mouth daily.     Chromium Picolinate 200 MCG CAPS Take 200 mcg by mouth daily.     clopidogrel (PLAVIX) 75 MG tablet Take 1 tablet (75 mg total) by mouth daily. 30 tablet 3   Coenzyme Q10 (CO Q-10) 300 MG CAPS Take 300 mg by mouth every evening.     diphenhydrAMINE (BENADRYL) 25 MG tablet Take 25 mg by mouth at bedtime.     estradiol (ESTRACE) 0.1 MG/GM vaginal cream Place 1 Applicatorful vaginally 2 (two) times a week.     HYDROmorphone (DILAUDID) 2 MG tablet Take 0.5 tablets (1 mg total) by mouth every 4 (four) hours as needed for severe pain. 5 tablet 0   ibuprofen (ADVIL) 200 MG tablet Take 400 mg by mouth every 6 (six) hours as needed for moderate pain or headache (about 4 or 5 time a week).     levothyroxine (SYNTHROID) 88 MCG tablet Take 88 mcg by mouth daily before breakfast.     Lysine 600 MG TABS Take 600 mg by mouth every evening.     moxifloxacin (VIGAMOX) 0.5 % ophthalmic solution  Place 1 drop into the left eye daily.     Omega-3 Fatty Acids (FISH OIL PO) Take 1,400 mg by mouth daily.     oxyCODONE (ROXICODONE) 5 MG immediate release tablet Take 1 tablet (5 mg total) by mouth every 4 (four) hours as needed for severe pain or breakthrough pain. 30 tablet 0   Polyvinyl Alcohol-Povidone (REFRESH OP) Place 1 drop into the left eye See admin instructions. Middle of the night 0300 as needed     predniSONE (DELTASONE) 1  MG tablet Take 4 mg by mouth every evening.     rosuvastatin (CRESTOR) 20 MG tablet Take 1 tablet (20 mg total) by mouth daily at 6 PM. 30 tablet 3   timolol (TIMOPTIC) 0.25 % ophthalmic solution Place 1 drop into both eyes daily.     ZIOPTAN 0.0015 % SOLN Place 1 drop into both eyes at bedtime.  3   zoledronic acid (RECLAST) 5 MG/100ML SOLN injection Inject 5 mg into the vein once. 02/22/23 Yearly     No current facility-administered medications for this visit.   No results found.  Review of Systems:   A ROS was performed including pertinent positives and negatives as documented in the HPI.   Musculoskeletal Exam:    There were no vitals taken for this visit.  Right shoulder incisions are well-appearing without erythema or drainage.  In the standing position she is able to active range of motion to approximately 150 degrees without pain.  External rotation at side is to 45.  Internal rotation is to L1   distal neurosensory exam is intact  Imaging:      I personally reviewed and interpreted the radiographs.   Assessment:   12-week status post right shoulder rotator cuff repair doing very well today's visit.  Overall she is doing extremely well.  Range of motion and strength are much improved.  At this time I believe that she may discontinue therapy, the beginning of January.  I will plan to see her back as needed Plan :    -Return to clinic as needed      I personally saw and evaluated the patient, and participated in the management and  treatment plan.  Huel Cote, MD Attending Physician, Orthopedic Surgery  This document was dictated using Dragon voice recognition software. A reasonable attempt at proof reading has been made to minimize errors.

## 2023-07-25 NOTE — Therapy (Signed)
OUTPATIENT PHYSICAL THERAPY SHOULDER TREATMENT        Patient Name: Misty Morris MRN: [id number] DOB:1951/04/13, 72 y.o., female Today's Date: 07/27/2023  END OF SESSION:  PT End of Session - 07/26/23 0942     Visit Number 20    Number of Visits 29    Date for PT Re-Evaluation 08/25/23    Authorization Type [employer]    PT Start Time 0939    PT Stop Time 1025    PT Time Calculation (min) 46 min    Activity Tolerance Patient tolerated treatment well    Behavior During Therapy [facility] for tasks assessed/performed                   Past Medical History:  Diagnosis Date   Cancer (HCC)    hx of thyroid cancer   Fibromyalgia    Hypercholesterolemia    Hypertension    Hypothyroidism    thyroid removed   Polymyalgia rheumatica (HCC)    Rheumatoid arthritis (HCC)    "atypical/MD 08/2017" (02/14/2018)   Shingles    Stroke (HCC) 08/2017   "no residual" (02/14/2018)   TIA (transient ischemic attack) 02/14/2018   Type 2 diabetes, diet controlled (HCC)    Vision impairment    right eye   Past Surgical History:  Procedure Laterality Date   CATARACT EXTRACTION W/ INTRAOCULAR LENS  IMPLANT, BILATERAL     COLONOSCOPY WITH PROPOFOL N/A 10/12/2019   Procedure: COLONOSCOPY WITH PROPOFOL;  Surgeon: Jeani Hawking, MD;  Location: [facility];  Service: Endoscopy;  Laterality: N/A;   COLONOSCOPY WITH PROPOFOL N/A 12/24/2022   Procedure: COLONOSCOPY WITH PROPOFOL;  Surgeon: Jeani Hawking, MD;  Location: [facility];  Service: Gastroenterology;  Laterality: N/A;   HEMOSTASIS CLIP PLACEMENT  10/12/2019   Procedure: HEMOSTASIS CLIP PLACEMENT;  Surgeon: Jeani Hawking, MD;  Location: [facility];  Service: Endoscopy;;   JOINT REPLACEMENT     OPEN REDUCTION LE FORT I FRACTURE  1980s   PARTIAL KNEE ARTHROPLASTY Left 01/17/2017   Procedure: UNICOMPARTMENTAL LEFT KNEE;  Surgeon: Durene Romans, MD;  Location: [facility];  Service: Orthopedics;  Laterality: Left;   POLYPECTOMY   10/12/2019   Procedure: POLYPECTOMY;  Surgeon: Jeani Hawking, MD;  [facility]: [facility];  Service: Endoscopy;;   RADIOLOGY WITH ANESTHESIA N/A 08/11/2017   Procedure: MRI OF BRAIN WITH AND WITHOUT CONSTRAST, AND OF THE ORBIT WITH AND WITHOUT CONTRAST;  Surgeon: Radiologist, Medication, MD;  Location: [facility];  Service: Radiology;  Laterality: N/A;   RADIOLOGY WITH ANESTHESIA N/A 09/06/2017   Procedure: MRI OF BRAIN;  Surgeon: Radiologist, Medication, MD;  Location: [facility];  Service: Radiology;  Laterality: N/A;   RADIOLOGY WITH ANESTHESIA Right 03/03/2023   Procedure: MRI SHOULDER WITHOUT CONTRAST WITH ANESTHESIA;  Surgeon: Radiologist, Medication, MD;  Location: [facility];  Service: Radiology;  Laterality: Right;   RHINOPLASTY     SHOULDER ARTHROSCOPY WITH ROTATOR CUFF REPAIR Right 04/25/2023   Procedure: RIGHT SHOULDER ARTHROSCOPY WITH ROTATOR CUFF REPAIR, ACROMIOPLASTY;  Surgeon: Huel Cote, MD;  Location: [facility];  Service: Orthopedics;  Laterality: Right;   TOTAL KNEE ARTHROPLASTY Right 09/30/2015   Procedure: TOTAL RIGHT KNEE ARTHROPLASTY;  Surgeon: Durene Romans, MD;  Location: [facility];  Service: Orthopedics;  Laterality: Right;   VAGINAL HYSTERECTOMY     Patient Active Problem List   Diagnosis Date Noted   UTI (urinary tract infection) 10/07/2018   Sepsis (HCC) 10/07/2018   Hypokalemia 10/07/2018   S/P total thyroidectomy 10/07/2018   Rheumatoid arthritis (  HCC) 09/18/2018   Papillary thyroid carcinoma (HCC) 08/24/2018   TIA (transient ischemic attack) 02/14/2018   Hyperlipidemia    Ischemic optic neuropathy of right eye    Stroke (HCC) 09/04/2017   HTN (hypertension) 09/04/2017   DM (diabetes mellitus) (HCC) 09/04/2017   Polymyalgia rheumatica (HCC) 09/04/2017   Fibromyalgia 09/04/2017   S/P left UKR 01/17/2017   Overweight (BMI 25.0-29.9) 10/01/2015   S/P right TKA 09/30/2015    PCP: Irena Reichmann DO  REFERRING PROVIDER: Huel Cote, MD   REFERRING DIAG: [phone] (ICD-10-CM)  - Traumatic complete tear of right rotator cuff, initial encounter  THERAPY DIAG:  Right shoulder pain, unspecified chronicity  Muscle weakness (generalized)  Other symptoms and signs involving the musculoskeletal system  Rationale for Evaluation and Treatment: Rehabilitation  ONSET DATE: DOS 04/25/23  R RCR  SUBJECTIVE:                                                                                                                                                                                      SUBJECTIVE STATEMENT: Pt is 13 weeks and 1 day s/p R shoulder arthroscopic rotator cuff repair, extensive debridement, and subacromial decompression.  Pt denies any adverse effects after prior Rx. Pt reports compliance with HEP and has had no issues with her new home exercises.    FUNCTIONAL IMPROVEMENTS:  dressing, bathing, washing hair, washing dishes, laundry, reaching FUNCTIONAL LIMITATIONS:  household chores, opening/closing doors, brushing hair, reaching overhead   Hand dominance: Right  PERTINENT HISTORY: Hx cancer, fibromyalgia, PMR, HTN, HLD, RA, Hx CVA/TIA, DM  PAIN:  1-2/10 current R shoulder 3/10 worst pain 0/10 best   PRECAUTIONS: Shoulder R RCR  RED FLAGS: None   WEIGHT BEARING RESTRICTIONS:  NWB RUE  FALLS:  Has patient fallen in last 6 months? No   PLOF: Independent  PATIENT GOALS:get this arm working again    OBJECTIVE:    TODAY'S TREATMENT:  Pt received R shoulder PROM flexion, abduction, ER, and IR per pt and tissue tolerance w/n protocol range.  Pt performed: Supine serratus punch 2x10 with 1# Supine shoulder ABC x 1 reps with 1# S/L ER 2x10 Standing rows with retraction with YTB 3x10 ER/IR with arm at side with YTB 3x10 Standing jobe's flexion 0# x 10, 1# x10 Standing scaption 2x10 Standing wand IR 2x10     PT updated HEP.  Pt received a HEP handout and was educated in correct form and appropriate frequency.  PT instructed pt should not have pain with HEP.      PATIENT EDUCATION:  Education details:  POC, exercise form, HEP, ROM findings, goal progress, and precautions/protocol.  PT answered pt's questions.  Person educated: Patient Education method: Explanation, Demonstration, verbal and tactile cues, handout Education comprehension: verbalized understanding, returned demonstration, verbal and tactile cues required  HOME EXERCISE PROGRAM: Access Code: [email or web address] URL: [email or web address]    Updated HEP: - Supine Single Arm Shoulder Protraction  - 1 x daily - 3 x weekly - 2-3 sets - 10 reps - Standing Shoulder Scaption  - 1 x daily - 5-6 x weekly - 2 sets - 10 reps - Standing Shoulder Row with Anchored Resistance  - 1 x daily - 3 x weekly - 3 sets - 10 reps - Shoulder External Rotation with Anchored Resistance  - 1 x daily - 3 x weekly - 2-3 sets - 10 reps - Shoulder Internal Rotation with Resistance  - 1 x daily - 3 x weekly - 2-3 sets - 10 reps    ASSESSMENT:  CLINICAL IMPRESSION: Pt is making great progress in PT.  Pt tolerated shoulder PROM well.  PT progressed exercises per protocol with adding light resistance and pt tolerated progression well.  She performed exercises well with cuing and instruction in correct form.  Pt had no c/o's with new exercises.  PT updated HEP and gave pt a HEP handout.  Pt demonstrates good understanding of HEP.  She responded well to Rx stating she felt better after Rx.   She will cont to benefit from cont skilled PT services per protocol to improve ROM, address goals, and to assist in restoring desired level of function.   OBJECTIVE IMPAIRMENTS: decreased activity tolerance, decreased endurance, decreased mobility, decreased ROM, decreased strength, impaired flexibility, impaired UE functional use, improper body mechanics, postural  dysfunction, and pain.   ACTIVITY LIMITATIONS: carrying, lifting, bending, bed mobility, bathing, dressing, self feeding, reach over head, hygiene/grooming, locomotion level, and caring for others  PARTICIPATION LIMITATIONS: meal prep, cleaning, laundry, driving, shopping, community activity, and yard work  PERSONAL FACTORS: 3+ comorbidities: Hx cancer, fibromyalgia, HTN, HLD, RA, Hx CVA/TIA, DM  are also affecting patient's functional outcome.   REHAB POTENTIAL: Good  CLINICAL DECISION MAKING: Stable/uncomplicated  EVALUATION COMPLEXITY: Low   GOALS: Goals reviewed with patient? Yes  SHORT TERM GOALS: Target date: 05/25/2023    Patient will be independent with HEP in order to improve functional outcomes. Baseline: Goal status: GOAL MET  10/24  2.  Patient will report at least 25% improvement in symptoms for improved quality of life. Baseline:  Goal status: GOAL MET   10/24  3.  Patient will tolerate beginning to wean from sling with MD approval for improved functional use of RUE.  Baseline:  Goal status: goal met    LONG TERM GOALS: Target date: 08/25/2023    Patient will report at least 75% improvement in symptoms for improved quality of life. Baseline:  Goal status  ONGOING  2.  Patient will improve FOTO score by at least 32 points in order to indicate improved tolerance to activity. Baseline: 57 Goal status: progressing  12/12  3.  Patient will demonstrate at least 150 degrees in shoulder AROM in flexion for improved ability lift overhead. Baseline:  Goal status: goal met  12/12  4.  Patient will demonstrate grade of at least 4+/5 MMT grade in all tested musculature as evidence of improved strength to assist with lifting at home. Baseline:  Goal status: INITIAL  5.  Patient will be progressing with resisted strengthening in order to build strength for chores and exercise.  Baseline:  Goal status: INITIAL    PLAN:  PT FREQUENCY: 2x/wk  PT DURATION: 7  weeks  PLANNED INTERVENTIONS: Therapeutic exercises, Therapeutic activity, Neuromuscular re-education, Balance training, Gait training, Patient/Family education, Joint manipulation, Joint mobilization, Stair training, Orthotic/Fit training, DME instructions, Aquatic Therapy, Dry Needling, Electrical stimulation, Spinal manipulation, Spinal mobilization, Cryotherapy, Moist heat, Compression bandaging, scar mobilization, Splintting, Taping, Traction, Ultrasound, Ionotophoresis 4mg /ml Dexamethasone, and Manual therapy   PLAN FOR NEXT SESSION: Continue to progress per Dr. Serena Croissant RCR protocol.    Audie Clear III PT, DPT 07/27/23 2:11 PM

## 2023-07-26 ENCOUNTER — Ambulatory Visit (HOSPITAL_BASED_OUTPATIENT_CLINIC_OR_DEPARTMENT_OTHER): Payer: PPO | Admitting: Physical Therapy

## 2023-07-26 DIAGNOSIS — M25511 Pain in right shoulder: Secondary | ICD-10-CM

## 2023-07-26 DIAGNOSIS — M6281 Muscle weakness (generalized): Secondary | ICD-10-CM

## 2023-07-26 DIAGNOSIS — R29898 Other symptoms and signs involving the musculoskeletal system: Secondary | ICD-10-CM

## 2023-07-27 ENCOUNTER — Encounter (HOSPITAL_BASED_OUTPATIENT_CLINIC_OR_DEPARTMENT_OTHER): Payer: Self-pay | Admitting: Physical Therapy

## 2023-07-28 ENCOUNTER — Ambulatory Visit (HOSPITAL_BASED_OUTPATIENT_CLINIC_OR_DEPARTMENT_OTHER): Payer: PPO | Admitting: Physical Therapy

## 2023-07-28 DIAGNOSIS — M25511 Pain in right shoulder: Secondary | ICD-10-CM | POA: Diagnosis not present

## 2023-07-28 DIAGNOSIS — M6281 Muscle weakness (generalized): Secondary | ICD-10-CM

## 2023-07-28 DIAGNOSIS — R29898 Other symptoms and signs involving the musculoskeletal system: Secondary | ICD-10-CM

## 2023-07-28 NOTE — Therapy (Signed)
OUTPATIENT PHYSICAL THERAPY SHOULDER TREATMENT        Patient Name: Misty Morris MRN: [id number] DOB:Aug 10, 1950, 72 y.o., female Today's Date: 07/29/2023  END OF SESSION:  PT End of Session - 07/28/23 1459     Visit Number 21    Number of Visits 29    Date for PT Re-Evaluation 08/25/23    Authorization Type [employer]    PT Start Time 1455    PT Stop Time 1536    PT Time Calculation (min) 41 min    Activity Tolerance Patient tolerated treatment well    Behavior During Therapy WFL for tasks assessed/performed                   Past Medical History:  Diagnosis Date   Cancer (HCC)    hx of thyroid cancer   Fibromyalgia    Hypercholesterolemia    Hypertension    Hypothyroidism    thyroid removed   Polymyalgia rheumatica (HCC)    Rheumatoid arthritis (HCC)    "atypical/MD 08/2017" (02/14/2018)   Shingles    Stroke (HCC) 08/2017   "no residual" (02/14/2018)   TIA (transient ischemic attack) 02/14/2018   Type 2 diabetes, diet controlled (HCC)    Vision impairment    right eye   Past Surgical History:  Procedure Laterality Date   CATARACT EXTRACTION W/ INTRAOCULAR LENS  IMPLANT, BILATERAL     COLONOSCOPY WITH PROPOFOL N/A 10/12/2019   Procedure: COLONOSCOPY WITH PROPOFOL;  Surgeon: Jeani Hawking, MD;  Location: [facility];  Service: Endoscopy;  Laterality: N/A;   COLONOSCOPY WITH PROPOFOL N/A 12/24/2022   Procedure: COLONOSCOPY WITH PROPOFOL;  Surgeon: Jeani Hawking, MD;  Location: [facility];  Service: Gastroenterology;  Laterality: N/A;   HEMOSTASIS CLIP PLACEMENT  10/12/2019   Procedure: HEMOSTASIS CLIP PLACEMENT;  Surgeon: Jeani Hawking, MD;  Location: [facility];  Service: Endoscopy;;   JOINT REPLACEMENT     OPEN REDUCTION LE FORT I FRACTURE  1980s   PARTIAL KNEE ARTHROPLASTY Left 01/17/2017   Procedure: UNICOMPARTMENTAL LEFT KNEE;  Surgeon: Durene Romans, MD;  Location: [facility];  Service: Orthopedics;  Laterality: Left;   POLYPECTOMY   10/12/2019   Procedure: POLYPECTOMY;  Surgeon: Jeani Hawking, MD;  Location: [facility];  Service: Endoscopy;;   RADIOLOGY WITH ANESTHESIA N/A 08/11/2017   Procedure: MRI OF BRAIN WITH AND WITHOUT CONSTRAST, AND OF THE ORBIT WITH AND WITHOUT CONTRAST;  Surgeon: Radiologist, Medication, MD;  Location: [facility];  Service: Radiology;  Laterality: N/A;   RADIOLOGY WITH ANESTHESIA N/A 09/06/2017   Procedure: MRI OF BRAIN;  Surgeon: Radiologist, Medication, MD;  Location: [facility];  Service: Radiology;  Laterality: N/A;   RADIOLOGY WITH ANESTHESIA Right 03/03/2023   Procedure: MRI SHOULDER WITHOUT CONTRAST WITH ANESTHESIA;  Surgeon: Radiologist, Medication, MD;  Location: [facility];  Service: Radiology;  Laterality: Right;   RHINOPLASTY     SHOULDER ARTHROSCOPY WITH ROTATOR CUFF REPAIR Right 04/25/2023   Procedure: RIGHT SHOULDER ARTHROSCOPY WITH ROTATOR CUFF REPAIR, ACROMIOPLASTY;  Surgeon: Huel Cote, MD;  Location: [facility];  Service: Orthopedics;  Laterality: Right;   TOTAL KNEE ARTHROPLASTY Right 09/30/2015   Procedure: TOTAL RIGHT KNEE ARTHROPLASTY;  Surgeon: Durene Romans, MD;  Location: [facility];  Service: Orthopedics;  Laterality: Right;   VAGINAL HYSTERECTOMY     Patient Active Problem List   Diagnosis Date Noted   UTI (urinary tract infection) 10/07/2018   Sepsis (HCC) 10/07/2018   Hypokalemia 10/07/2018   S/P total thyroidectomy 10/07/2018   Rheumatoid arthritis (  HCC) 09/18/2018   Papillary thyroid carcinoma (HCC) 08/24/2018   TIA (transient ischemic attack) 02/14/2018   Hyperlipidemia    Ischemic optic neuropathy of right eye    Stroke (HCC) 09/04/2017   HTN (hypertension) 09/04/2017   DM (diabetes mellitus) (HCC) 09/04/2017   Polymyalgia rheumatica (HCC) 09/04/2017   Fibromyalgia 09/04/2017   S/P left UKR 01/17/2017   Overweight (BMI 25.0-29.9) 10/01/2015   S/P right TKA 09/30/2015    PCP: Irena Reichmann DO  REFERRING PROVIDER: Huel Cote, MD   REFERRING DIAG: [id number] (ICD-10-CM)  - Traumatic complete tear of right rotator cuff, initial encounter  THERAPY DIAG:  Right shoulder pain, unspecified chronicity  Muscle weakness (generalized)  Other symptoms and signs involving the musculoskeletal system  Rationale for Evaluation and Treatment: Rehabilitation  ONSET DATE: DOS 04/25/23  R RCR  SUBJECTIVE:                                                                                                                                                                                      SUBJECTIVE STATEMENT: Pt is 13 weeks and 3 days s/p R shoulder arthroscopic rotator cuff repair, extensive debridement, and subacromial decompression.  Pt denies any adverse effects after prior Rx. Pt reports compliance with HEP and has had no issues with her new home exercises.    FUNCTIONAL IMPROVEMENTS:  dressing, bathing, washing hair, washing dishes, laundry, reaching FUNCTIONAL LIMITATIONS:  household chores, opening/closing doors, brushing hair, reaching overhead   Hand dominance: Right  PERTINENT HISTORY: Hx cancer, fibromyalgia, PMR, HTN, HLD, RA, Hx CVA/TIA, DM  PAIN:  1-2/10 current R shoulder 3/10 worst pain 0/10 best   PRECAUTIONS: Shoulder R RCR  RED FLAGS: None   WEIGHT BEARING RESTRICTIONS:  NWB RUE  FALLS:  Has patient fallen in last 6 months? No   PLOF: Independent  PATIENT GOALS:get this arm working again    OBJECTIVE:    TODAY'S TREATMENT:  Pt received R shoulder PROM flexion, abduction, ER, and IR per pt and tissue tolerance w/n protocol range.  Pt performed: Supine serratus punch 2x10 with 1# Supine shoulder ABC x 1 reps with 1# Standing rows with retraction with YTB 2x10 Standing shoulder extension with retraction with YTB 2x10  ER/IR with arm at side with YTB 3x10 Standing jobe's flexion 1# 2x10 Standing  scaption 2x10 Standing wand IR 2x10    PT updated HEP.  Pt received a HEP handout and was educated in correct form and appropriate frequency.  PT instructed pt should not have pain with HEP.      PATIENT EDUCATION:  Education details:  POC, exercise form, HEP, ROM findings, goal progress, and precautions/protocol.  PT answered pt's questions.  Person educated: Patient Education method: Explanation, Demonstration, verbal and tactile cues, handout Education comprehension: verbalized understanding, returned demonstration, verbal and tactile cues required  HOME EXERCISE PROGRAM: Access Code: [email or web address] URL: [email or web address]    Updated HEP: - Standing Shoulder Flexion with Dumbbells  - 1 x daily - 3 x weekly - 2-3 sets - 10 reps - Standing Bilateral Shoulder Internal Rotation AAROM with Dowel  - 2 x daily - 7 x weekly - 2 sets - 10 reps    ASSESSMENT:  CLINICAL IMPRESSION: Pt is progressing well with ROM, strength, and function.  Pt is progressing appropriately with protocol performing strengthening exercises with good tolerance.  She performed exercises well with cuing and instruction in correct form.  PT updated HEP and gave pt a HEP handout.  Pt demonstrates good understanding of HEP.  She responded well to Rx stating she felt good after Rx and had no increased pain.   She will cont to benefit from cont skilled PT services per protocol to improve ROM, address goals, and to assist in restoring desired level of function.   OBJECTIVE IMPAIRMENTS: decreased activity tolerance, decreased endurance, decreased mobility, decreased ROM, decreased strength, impaired flexibility, impaired UE functional use, improper body mechanics, postural dysfunction, and pain.   ACTIVITY LIMITATIONS: carrying, lifting, bending, bed mobility, bathing, dressing, self feeding, reach over head, hygiene/grooming, locomotion level, and caring for others  PARTICIPATION LIMITATIONS: meal prep,  cleaning, laundry, driving, shopping, community activity, and yard work  PERSONAL FACTORS: 3+ comorbidities: Hx cancer, fibromyalgia, HTN, HLD, RA, Hx CVA/TIA, DM  are also affecting patient's functional outcome.   REHAB POTENTIAL: Good  CLINICAL DECISION MAKING: Stable/uncomplicated  EVALUATION COMPLEXITY: Low   GOALS: Goals reviewed with patient? Yes  SHORT TERM GOALS: Target date: 05/25/2023    Patient will be independent with HEP in order to improve functional outcomes. Baseline: Goal status: GOAL MET  10/24  2.  Patient will report at least 25% improvement in symptoms for improved quality of life. Baseline:  Goal status: GOAL MET   10/24  3.  Patient will tolerate beginning to wean from sling with MD approval for improved functional use of RUE.  Baseline:  Goal status: goal met    LONG TERM GOALS: Target date: 08/25/2023    Patient will report at least 75% improvement in symptoms for improved quality of life. Baseline:  Goal status  ONGOING  2.  Patient will improve FOTO score by at least 32 points in order to indicate improved tolerance to activity. Baseline: 57 Goal status: progressing  12/12  3.  Patient will demonstrate at least 150 degrees in shoulder AROM in flexion for improved ability lift overhead. Baseline:  Goal status: goal met  12/12  4.  Patient will demonstrate  grade of at least 4+/5 MMT grade in all tested musculature as evidence of improved strength to assist with lifting at home. Baseline:  Goal status: INITIAL  5.  Patient will be progressing with resisted strengthening in order to build strength for chores and exercise.  Baseline:  Goal status: INITIAL    PLAN:  PT FREQUENCY: 2x/wk  PT DURATION: 7 weeks  PLANNED INTERVENTIONS: Therapeutic exercises, Therapeutic activity, Neuromuscular re-education, Balance training, Gait training, Patient/Family education, Joint manipulation, Joint mobilization, Stair training, Orthotic/Fit  training, DME instructions, Aquatic Therapy, Dry Needling, Electrical stimulation, Spinal manipulation, Spinal mobilization, Cryotherapy, Moist heat, Compression bandaging, scar mobilization, Splintting, Taping, Traction, Ultrasound, Ionotophoresis 4mg /ml Dexamethasone, and Manual therapy   PLAN FOR NEXT SESSION: Continue to progress per Dr. Serena Croissant RCR protocol.    Audie Clear III PT, DPT 07/29/23 12:22 PM

## 2023-07-29 ENCOUNTER — Encounter (HOSPITAL_BASED_OUTPATIENT_CLINIC_OR_DEPARTMENT_OTHER): Payer: Self-pay | Admitting: Physical Therapy

## 2023-08-01 ENCOUNTER — Ambulatory Visit (HOSPITAL_BASED_OUTPATIENT_CLINIC_OR_DEPARTMENT_OTHER): Payer: PPO

## 2023-08-01 ENCOUNTER — Encounter (HOSPITAL_BASED_OUTPATIENT_CLINIC_OR_DEPARTMENT_OTHER): Payer: Self-pay

## 2023-08-01 DIAGNOSIS — M6281 Muscle weakness (generalized): Secondary | ICD-10-CM

## 2023-08-01 DIAGNOSIS — M25511 Pain in right shoulder: Secondary | ICD-10-CM

## 2023-08-01 DIAGNOSIS — R29898 Other symptoms and signs involving the musculoskeletal system: Secondary | ICD-10-CM

## 2023-08-01 DIAGNOSIS — S46011A Strain of muscle(s) and tendon(s) of the rotator cuff of right shoulder, initial encounter: Secondary | ICD-10-CM

## 2023-08-01 NOTE — Therapy (Signed)
OUTPATIENT PHYSICAL THERAPY SHOULDER TREATMENT        Patient Name: Misty Morris MRN: [id number] DOB:May 01, 1951, 72 y.o., female Today's Date: 08/01/2023  END OF SESSION:          Past Medical History:  Diagnosis Date   Cancer (HCC)    hx of thyroid cancer   Fibromyalgia    Hypercholesterolemia    Hypertension    Hypothyroidism    thyroid removed   Polymyalgia rheumatica (HCC)    Rheumatoid arthritis (HCC)    "atypical/MD 08/2017" (02/14/2018)   Shingles    Stroke (HCC) 08/2017   "no residual" (02/14/2018)   TIA (transient ischemic attack) 02/14/2018   Type 2 diabetes, diet controlled (HCC)    Vision impairment    right eye   Past Surgical History:  Procedure Laterality Date   CATARACT EXTRACTION W/ INTRAOCULAR LENS  IMPLANT, BILATERAL     COLONOSCOPY WITH PROPOFOL N/A 10/12/2019   Procedure: COLONOSCOPY WITH PROPOFOL;  Surgeon: Jeani Hawking, MD;  Location: [facility];  Service: Endoscopy;  Laterality: N/A;   COLONOSCOPY WITH PROPOFOL N/A 12/24/2022   Procedure: COLONOSCOPY WITH PROPOFOL;  Surgeon: Jeani Hawking, MD;  Location: [facility];  Service: Gastroenterology;  Laterality: N/A;   HEMOSTASIS CLIP PLACEMENT  10/12/2019   Procedure: HEMOSTASIS CLIP PLACEMENT;  Surgeon: Jeani Hawking, MD;  Location: [facility];  Service: Endoscopy;;   JOINT REPLACEMENT     OPEN REDUCTION LE FORT I FRACTURE  1980s   PARTIAL KNEE ARTHROPLASTY Left 01/17/2017   Procedure: UNICOMPARTMENTAL LEFT KNEE;  Surgeon: Durene Romans, MD;  Location: [facility];  Service: Orthopedics;  Laterality: Left;   POLYPECTOMY  10/12/2019   Procedure: POLYPECTOMY;  Surgeon: Jeani Hawking, MD;  Location: [facility];  Service: Endoscopy;;   RADIOLOGY WITH ANESTHESIA N/A 08/11/2017   Procedure: MRI OF BRAIN WITH AND WITHOUT CONSTRAST, AND OF THE ORBIT WITH AND WITHOUT CONTRAST;  Surgeon: Radiologist, Medication, MD;  Location: [facility];  Service: Radiology;  Laterality: N/A;   RADIOLOGY WITH ANESTHESIA N/A  09/06/2017   Procedure: MRI OF BRAIN;  Surgeon: Radiologist, Medication, MD;  Location: [facility];  Service: Radiology;  Laterality: N/A;   RADIOLOGY WITH ANESTHESIA Right 03/03/2023   Procedure: MRI SHOULDER WITHOUT CONTRAST WITH ANESTHESIA;  Surgeon: Radiologist, Medication, MD;  Location: [facility];  Service: Radiology;  Laterality: Right;   RHINOPLASTY     SHOULDER ARTHROSCOPY WITH ROTATOR CUFF REPAIR Right 04/25/2023   Procedure: RIGHT SHOULDER ARTHROSCOPY WITH ROTATOR CUFF REPAIR, ACROMIOPLASTY;  Surgeon: Huel Cote, MD;  Location: [facility];  Service: Orthopedics;  Laterality: Right;   TOTAL KNEE ARTHROPLASTY Right 09/30/2015   Procedure: TOTAL RIGHT KNEE ARTHROPLASTY;  Surgeon: Durene Romans, MD;  Location: [facility];  Service: Orthopedics;  Laterality: Right;   VAGINAL HYSTERECTOMY     Patient Active Problem List   Diagnosis Date Noted   UTI (urinary tract infection) 10/07/2018   Sepsis (HCC) 10/07/2018   Hypokalemia 10/07/2018   S/P total thyroidectomy 10/07/2018   Rheumatoid arthritis (HCC) 09/18/2018   Papillary thyroid carcinoma (HCC) 08/24/2018   TIA (transient ischemic attack) 02/14/2018   Hyperlipidemia    Ischemic optic neuropathy of right eye    Stroke (HCC) 09/04/2017   HTN (hypertension) 09/04/2017   DM (diabetes mellitus) (HCC) 09/04/2017   Polymyalgia rheumatica (HCC) 09/04/2017   Fibromyalgia 09/04/2017   S/P left UKR 01/17/2017   Overweight (BMI 25.0-29.9) 10/01/2015   S/P right TKA 09/30/2015    PCP: Irena Reichmann DO  REFERRING PROVIDER: Huel Cote, MD  REFERRING DIAG: S46.011A (ICD-10-CM) - Traumatic complete tear of right rotator cuff, initial encounter  THERAPY DIAG:  Right shoulder pain, unspecified chronicity  Muscle weakness (generalized)  Other symptoms and signs involving the musculoskeletal system  Traumatic complete tear of right rotator cuff, initial encounter  Rationale for Evaluation and Treatment: Rehabilitation  ONSET DATE: DOS 04/25/23   R RCR  SUBJECTIVE:                                                                                                                                                                                      SUBJECTIVE STATEMENT: Pt is 13 weeks and 3 days s/p R shoulder arthroscopic rotator cuff repair, extensive debridement, and subacromial decompression.  Pt denies any adverse effects after prior Rx. Pt reports compliance with HEP and has had no issues with her new home exercises.    FUNCTIONAL IMPROVEMENTS:  dressing, bathing, washing hair, washing dishes, laundry, reaching FUNCTIONAL LIMITATIONS:  household chores, opening/closing doors, brushing hair, reaching overhead   Hand dominance: Right  PERTINENT HISTORY: Hx cancer, fibromyalgia, PMR, HTN, HLD, RA, Hx CVA/TIA, DM  PAIN:  1-2/10 current R shoulder 3/10 worst pain 0/10 best   PRECAUTIONS: Shoulder R RCR  RED FLAGS: None   WEIGHT BEARING RESTRICTIONS:  NWB RUE  FALLS:  Has patient fallen in last 6 months? No   PLOF: Independent  PATIENT GOALS:get this arm working again    OBJECTIVE:    TODAY'S TREATMENT:                                                                                                                                          PROM all planes including IR behind back in sidelying  Pt performed: Supine serratus punch 2x10 with 1# Supine shoulder ABC x 1 reps with 1# Supine flexion 1# 2x10 Sidelying abduction 1# 2x10 Sidelying ER 1# 2x10 Standing rows with retraction with RTB 2x10 Standing shoulder extension with retraction with RTB 2x10 TB IR/ER RTB 2x10 ea Standing active flexion 1# 2x10 Standing active abduction 1# 2x10  PATIENT EDUCATION:  Education details:  POC, exercise form, HEP, ROM findings, goal progress, and precautions/protocol.  PT answered pt's questions.  Person educated: Patient Education method: Explanation, Demonstration, verbal and tactile cues, handout Education  comprehension: verbalized understanding, returned demonstration, verbal and tactile cues required  HOME EXERCISE PROGRAM: Access Code: [email or web address] URL: [email or web address]    Updated HEP: - Standing Shoulder Flexion with Dumbbells  - 1 x daily - 3 x weekly - 2-3 sets - 10 reps - Standing Bilateral Shoulder Internal Rotation AAROM with Dowel  - 2 x daily - 7 x weekly - 2 sets - 10 reps    ASSESSMENT:  CLINICAL IMPRESSION: Increased resistance with theraband exercises today with good tolerance. She demonstrates excellent ROM and tolerates AROM strengthening well. Did fatigue with standing AROM strengthening using 1#, but denied pain.  Remains challenged with IR behind back.    OBJECTIVE IMPAIRMENTS: decreased activity tolerance, decreased endurance, decreased mobility, decreased ROM, decreased strength, impaired flexibility, impaired UE functional use, improper body mechanics, postural dysfunction, and pain.   ACTIVITY LIMITATIONS: carrying, lifting, bending, bed mobility, bathing, dressing, self feeding, reach over head, hygiene/grooming, locomotion level, and caring for others  PARTICIPATION LIMITATIONS: meal prep, cleaning, laundry, driving, shopping, community activity, and yard work  PERSONAL FACTORS: 3+ comorbidities: Hx cancer, fibromyalgia, HTN, HLD, RA, Hx CVA/TIA, DM  are also affecting patient's functional outcome.   REHAB POTENTIAL: Good  CLINICAL DECISION MAKING: Stable/uncomplicated  EVALUATION COMPLEXITY: Low   GOALS: Goals reviewed with patient? Yes  SHORT TERM GOALS: Target date: 05/25/2023    Patient will be independent with HEP in order to improve functional outcomes. Baseline: Goal status: GOAL MET  10/24  2.  Patient will report at least 25% improvement in symptoms for improved quality of life. Baseline:  Goal status: GOAL MET   10/24  3.  Patient will tolerate beginning to wean from sling with MD approval for improved functional use of  RUE.  Baseline:  Goal status: goal met    LONG TERM GOALS: Target date: 08/25/2023    Patient will report at least 75% improvement in symptoms for improved quality of life. Baseline:  Goal status  ONGOING  2.  Patient will improve FOTO score by at least 32 points in order to indicate improved tolerance to activity. Baseline: 57 Goal status: progressing  12/12  3.  Patient will demonstrate at least 150 degrees in shoulder AROM in flexion for improved ability lift overhead. Baseline:  Goal status: goal met  12/12  4.  Patient will demonstrate grade of at least 4+/5 MMT grade in all tested musculature as evidence of improved strength to assist with lifting at home. Baseline:  Goal status: INITIAL  5.  Patient will be progressing with resisted strengthening in order to build strength for chores and exercise.  Baseline:  Goal status: INITIAL    PLAN:  PT FREQUENCY: 2x/wk  PT DURATION: 7 weeks  PLANNED INTERVENTIONS: Therapeutic exercises, Therapeutic activity, Neuromuscular re-education, Balance training, Gait training, Patient/Family education, Joint manipulation, Joint mobilization, Stair training, Orthotic/Fit training, DME instructions, Aquatic Therapy, Dry Needling, Electrical stimulation, Spinal manipulation, Spinal mobilization, Cryotherapy, Moist heat, Compression bandaging, scar mobilization, Splintting, Taping, Traction, Ultrasound, Ionotophoresis 4mg /ml Dexamethasone, and Manual therapy   PLAN FOR NEXT SESSION: Continue to progress per Dr. Serena Croissant RCR protocol.    Riki Altes, PTA  08/01/23 3:47 PM

## 2023-08-04 ENCOUNTER — Encounter (HOSPITAL_BASED_OUTPATIENT_CLINIC_OR_DEPARTMENT_OTHER): Payer: Self-pay

## 2023-08-04 ENCOUNTER — Ambulatory Visit (HOSPITAL_BASED_OUTPATIENT_CLINIC_OR_DEPARTMENT_OTHER): Payer: PPO

## 2023-08-04 DIAGNOSIS — M25511 Pain in right shoulder: Secondary | ICD-10-CM | POA: Diagnosis not present

## 2023-08-04 DIAGNOSIS — R29898 Other symptoms and signs involving the musculoskeletal system: Secondary | ICD-10-CM

## 2023-08-04 DIAGNOSIS — S46011A Strain of muscle(s) and tendon(s) of the rotator cuff of right shoulder, initial encounter: Secondary | ICD-10-CM

## 2023-08-04 DIAGNOSIS — M6281 Muscle weakness (generalized): Secondary | ICD-10-CM

## 2023-08-04 NOTE — Therapy (Signed)
OUTPATIENT PHYSICAL THERAPY SHOULDER TREATMENT        Patient Name: Misty Morris MRN: [id number] DOB:Jun 02, 1951, 72 y.o., female Today's Date: 08/04/2023  END OF SESSION:  PT End of Session - 08/04/23 1411     Visit Number 22    Number of Visits 29    Date for PT Re-Evaluation 08/25/23    Authorization Type [employer]    Progress Note Due on Visit 30    PT Start Time 1352    PT Stop Time 1430    PT Time Calculation (min) 38 min    Activity Tolerance Patient tolerated treatment well    Behavior During Therapy WFL for tasks assessed/performed                    Past Medical History:  Diagnosis Date   Cancer (HCC)    hx of thyroid cancer   Fibromyalgia    Hypercholesterolemia    Hypertension    Hypothyroidism    thyroid removed   Polymyalgia rheumatica (HCC)    Rheumatoid arthritis (HCC)    "atypical/MD 08/2017" (02/14/2018)   Shingles    Stroke (HCC) 08/2017   "no residual" (02/14/2018)   TIA (transient ischemic attack) 02/14/2018   Type 2 diabetes, diet controlled (HCC)    Vision impairment    right eye   Past Surgical History:  Procedure Laterality Date   CATARACT EXTRACTION W/ INTRAOCULAR LENS  IMPLANT, BILATERAL     COLONOSCOPY WITH PROPOFOL N/A 10/12/2019   Procedure: COLONOSCOPY WITH PROPOFOL;  Surgeon: Jeani Hawking, MD;  Location: [facility];  Service: Endoscopy;  Laterality: N/A;   COLONOSCOPY WITH PROPOFOL N/A 12/24/2022   Procedure: COLONOSCOPY WITH PROPOFOL;  Surgeon: Jeani Hawking, MD;  Location: [facility];  Service: Gastroenterology;  Laterality: N/A;   HEMOSTASIS CLIP PLACEMENT  10/12/2019   Procedure: HEMOSTASIS CLIP PLACEMENT;  Surgeon: Jeani Hawking, MD;  Location: [facility];  Service: Endoscopy;;   JOINT REPLACEMENT     OPEN REDUCTION LE FORT I FRACTURE  1980s   PARTIAL KNEE ARTHROPLASTY Left 01/17/2017   Procedure: UNICOMPARTMENTAL LEFT KNEE;  Surgeon: Durene Romans, MD;  Location: [facility];  Service: Orthopedics;   Laterality: Left;   POLYPECTOMY  10/12/2019   Procedure: POLYPECTOMY;  Surgeon: Jeani Hawking, MD;  Location: [facility];  Service: Endoscopy;;   RADIOLOGY WITH ANESTHESIA N/A 08/11/2017   Procedure: MRI OF BRAIN WITH AND WITHOUT CONSTRAST, AND OF THE ORBIT WITH AND WITHOUT CONTRAST;  Surgeon: Radiologist, Medication, MD;  Location: [facility];  Service: Radiology;  Laterality: N/A;   RADIOLOGY WITH ANESTHESIA N/A 09/06/2017   Procedure: MRI OF BRAIN;  Surgeon: Radiologist, Medication, MD;  Location: [facility];  Service: Radiology;  Laterality: N/A;   RADIOLOGY WITH ANESTHESIA Right 03/03/2023   Procedure: MRI SHOULDER WITHOUT CONTRAST WITH ANESTHESIA;  Surgeon: Radiologist, Medication, MD;  Location: [facility];  Service: Radiology;  Laterality: Right;   RHINOPLASTY     SHOULDER ARTHROSCOPY WITH ROTATOR CUFF REPAIR Right 04/25/2023   Procedure: RIGHT SHOULDER ARTHROSCOPY WITH ROTATOR CUFF REPAIR, ACROMIOPLASTY;  Surgeon: Huel Cote, MD;  Location: [facility];  Service: Orthopedics;  Laterality: Right;   TOTAL KNEE ARTHROPLASTY Right 09/30/2015   Procedure: TOTAL RIGHT KNEE ARTHROPLASTY;  Surgeon: Durene Romans, MD;  Location: [facility];  Service: Orthopedics;  Laterality: Right;   VAGINAL HYSTERECTOMY     Patient Active Problem List   Diagnosis Date Noted   UTI (urinary tract infection) 10/07/2018   Sepsis (HCC) 10/07/2018   Hypokalemia 10/07/2018  S/P total thyroidectomy 10/07/2018   Rheumatoid arthritis (HCC) 09/18/2018   Papillary thyroid carcinoma (HCC) 08/24/2018   TIA (transient ischemic attack) 02/14/2018   Hyperlipidemia    Ischemic optic neuropathy of right eye    Stroke (HCC) 09/04/2017   HTN (hypertension) 09/04/2017   DM (diabetes mellitus) (HCC) 09/04/2017   Polymyalgia rheumatica (HCC) 09/04/2017   Fibromyalgia 09/04/2017   S/P left UKR 01/17/2017   Overweight (BMI 25.0-29.9) 10/01/2015   S/P right TKA 09/30/2015    PCP: Irena Reichmann DO  REFERRING PROVIDER: Huel Cote,  MD   REFERRING DIAG: [phone] (ICD-10-CM) - Traumatic complete tear of right rotator cuff, initial encounter  THERAPY DIAG:  Right shoulder pain, unspecified chronicity  Muscle weakness (generalized)  Other symptoms and signs involving the musculoskeletal system  Traumatic complete tear of right rotator cuff, initial encounter  Rationale for Evaluation and Treatment: Rehabilitation  ONSET DATE: DOS 04/25/23  R RCR  SUBJECTIVE:                                                                                                                                                                                      SUBJECTIVE STATEMENT: Pt reports no complaints in R shoulder. No increased soreness in shoulder after last visit.   FUNCTIONAL IMPROVEMENTS:  dressing, bathing, washing hair, washing dishes, laundry, reaching FUNCTIONAL LIMITATIONS:  household chores, opening/closing doors, brushing hair, reaching overhead   Hand dominance: Right  PERTINENT HISTORY: Hx cancer, fibromyalgia, PMR, HTN, HLD, RA, Hx CVA/TIA, DM  PAIN:  1-2/10 current R shoulder 3/10 worst pain 0/10 best   PRECAUTIONS: Shoulder R RCR  RED FLAGS: None   WEIGHT BEARING RESTRICTIONS:  NWB RUE  FALLS:  Has patient fallen in last 6 months? No   PLOF: Independent  PATIENT GOALS:get this arm working again    OBJECTIVE:    TODAY'S TREATMENT:                                                                                                                                          PROM  all planes including IR behind back in sidelying  Pt performed: Supine serratus punch 2x10 with 1# Supine shoulder ABC x 1 reps with 1# Supine flexion 1# 2x10 Sidelying abduction 1# 2x10 Sidelying ER 1# 2x10 Standing rows with retraction with RTB 2x10 Standing shoulder extension with retraction with RTB 2x10 Standing active flexion 1# 2x10 Standing active abduction 1# 2x10   PATIENT EDUCATION:  Education details:   POC, exercise form, HEP, ROM findings, goal progress, and precautions/protocol.  PT answered pt's questions.  Person educated: Patient Education method: Explanation, Demonstration, verbal and tactile cues, handout Education comprehension: verbalized understanding, returned demonstration, verbal and tactile cues required  HOME EXERCISE PROGRAM: Access Code: [email or web address] URL: [email or web address]    Updated HEP: - Standing Shoulder Flexion with Dumbbells  - 1 x daily - 3 x weekly - 2-3 sets - 10 reps - Standing Bilateral Shoulder Internal Rotation AAROM with Dowel  - 2 x daily - 7 x weekly - 2 sets - 10 reps    ASSESSMENT:  CLINICAL IMPRESSION: Continued to work on passive stretching in IR behind back as pt remains limited here. Pt continues to fatigue with resisted strengthening on plinth as well as standing so did not increase repetitions. No pain with exercises, though some discomfort with passive IR behind back. Will continue to progress functional strength and ROM.    OBJECTIVE IMPAIRMENTS: decreased activity tolerance, decreased endurance, decreased mobility, decreased ROM, decreased strength, impaired flexibility, impaired UE functional use, improper body mechanics, postural dysfunction, and pain.   ACTIVITY LIMITATIONS: carrying, lifting, bending, bed mobility, bathing, dressing, self feeding, reach over head, hygiene/grooming, locomotion level, and caring for others  PARTICIPATION LIMITATIONS: meal prep, cleaning, laundry, driving, shopping, community activity, and yard work  PERSONAL FACTORS: 3+ comorbidities: Hx cancer, fibromyalgia, HTN, HLD, RA, Hx CVA/TIA, DM  are also affecting patient's functional outcome.   REHAB POTENTIAL: Good  CLINICAL DECISION MAKING: Stable/uncomplicated  EVALUATION COMPLEXITY: Low   GOALS: Goals reviewed with patient? Yes  SHORT TERM GOALS: Target date: 05/25/2023    Patient will be independent with HEP in order to improve  functional outcomes. Baseline: Goal status: GOAL MET  10/24  2.  Patient will report at least 25% improvement in symptoms for improved quality of life. Baseline:  Goal status: GOAL MET   10/24  3.  Patient will tolerate beginning to wean from sling with MD approval for improved functional use of RUE.  Baseline:  Goal status: goal met    LONG TERM GOALS: Target date: 08/25/2023    Patient will report at least 75% improvement in symptoms for improved quality of life. Baseline:  Goal status  ONGOING  2.  Patient will improve FOTO score by at least 32 points in order to indicate improved tolerance to activity. Baseline: 57 Goal status: progressing  12/12  3.  Patient will demonstrate at least 150 degrees in shoulder AROM in flexion for improved ability lift overhead. Baseline:  Goal status: goal met  12/12  4.  Patient will demonstrate grade of at least 4+/5 MMT grade in all tested musculature as evidence of improved strength to assist with lifting at home. Baseline:  Goal status: INITIAL  5.  Patient will be progressing with resisted strengthening in order to build strength for chores and exercise.  Baseline:  Goal status: INITIAL    PLAN:  PT FREQUENCY: 2x/wk  PT DURATION: 7 weeks  PLANNED INTERVENTIONS: Therapeutic exercises, Therapeutic activity, Neuromuscular re-education, Balance training, Gait training, Patient/Family education, Joint manipulation, Joint mobilization, Stair  training, Orthotic/Fit training, DME instructions, Aquatic Therapy, Dry Needling, Electrical stimulation, Spinal manipulation, Spinal mobilization, Cryotherapy, Moist heat, Compression bandaging, scar mobilization, Splintting, Taping, Traction, Ultrasound, Ionotophoresis 4mg /ml Dexamethasone, and Manual therapy   PLAN FOR NEXT SESSION: Continue to progress per Dr. Serena Croissant RCR protocol.    Riki Altes, PTA  08/04/23 3:11 PM

## 2023-08-08 DIAGNOSIS — M2550 Pain in unspecified joint: Secondary | ICD-10-CM | POA: Diagnosis not present

## 2023-08-08 DIAGNOSIS — M353 Polymyalgia rheumatica: Secondary | ICD-10-CM | POA: Diagnosis not present

## 2023-08-08 DIAGNOSIS — M797 Fibromyalgia: Secondary | ICD-10-CM | POA: Diagnosis not present

## 2023-08-08 DIAGNOSIS — K219 Gastro-esophageal reflux disease without esophagitis: Secondary | ICD-10-CM | POA: Diagnosis not present

## 2023-08-08 DIAGNOSIS — M810 Age-related osteoporosis without current pathological fracture: Secondary | ICD-10-CM | POA: Diagnosis not present

## 2023-08-08 DIAGNOSIS — M1990 Unspecified osteoarthritis, unspecified site: Secondary | ICD-10-CM | POA: Diagnosis not present

## 2023-08-08 DIAGNOSIS — Z7952 Long term (current) use of systemic steroids: Secondary | ICD-10-CM | POA: Diagnosis not present

## 2023-08-09 ENCOUNTER — Ambulatory Visit (HOSPITAL_BASED_OUTPATIENT_CLINIC_OR_DEPARTMENT_OTHER): Payer: PPO

## 2023-08-09 ENCOUNTER — Encounter (HOSPITAL_BASED_OUTPATIENT_CLINIC_OR_DEPARTMENT_OTHER): Payer: Self-pay

## 2023-08-09 DIAGNOSIS — M25511 Pain in right shoulder: Secondary | ICD-10-CM | POA: Diagnosis not present

## 2023-08-09 DIAGNOSIS — S46011A Strain of muscle(s) and tendon(s) of the rotator cuff of right shoulder, initial encounter: Secondary | ICD-10-CM

## 2023-08-09 DIAGNOSIS — R29898 Other symptoms and signs involving the musculoskeletal system: Secondary | ICD-10-CM

## 2023-08-09 DIAGNOSIS — M6281 Muscle weakness (generalized): Secondary | ICD-10-CM

## 2023-08-09 NOTE — Therapy (Signed)
 OUTPATIENT PHYSICAL THERAPY SHOULDER TREATMENT        Patient Name: Misty Morris MRN: [id number] DOB:19-Dec-1950, 72 y.o., female Today's Date: 08/09/2023  END OF SESSION:  PT End of Session - 08/09/23 1349     Visit Number 23    Number of Visits 29    Date for PT Re-Evaluation 08/25/23    Authorization Type [employer]    Progress Note Due on Visit 30    PT Start Time 1348    PT Stop Time 1430    PT Time Calculation (min) 42 min    Activity Tolerance Patient tolerated treatment well    Behavior During Therapy WFL for tasks assessed/performed                     Past Medical History:  Diagnosis Date   Cancer (HCC)    hx of thyroid  cancer   Fibromyalgia    Hypercholesterolemia    Hypertension    Hypothyroidism    thyroid  removed   Polymyalgia rheumatica (HCC)    Rheumatoid arthritis (HCC)    atypical/MD 08/2017 (02/14/2018)   Shingles    Stroke (HCC) 08/2017   no residual (02/14/2018)   TIA (transient ischemic attack) 02/14/2018   Type 2 diabetes, diet controlled (HCC)    Vision impairment    right eye   Past Surgical History:  Procedure Laterality Date   CATARACT EXTRACTION W/ INTRAOCULAR LENS  IMPLANT, BILATERAL     COLONOSCOPY WITH PROPOFOL  N/A 10/12/2019   Procedure: COLONOSCOPY WITH PROPOFOL ;  Surgeon: Rollin Dover, MD;  Location: [facility];  Service: Endoscopy;  Laterality: N/A;   COLONOSCOPY WITH PROPOFOL  N/A 12/24/2022   Procedure: COLONOSCOPY WITH PROPOFOL ;  Surgeon: Rollin Dover, MD;  Location: [facility];  Service: Gastroenterology;  Laterality: N/A;   HEMOSTASIS CLIP PLACEMENT  10/12/2019   Procedure: HEMOSTASIS CLIP PLACEMENT;  Surgeon: Rollin Dover, MD;  Location: [facility];  Service: Endoscopy;;   JOINT REPLACEMENT     OPEN REDUCTION LE FORT I FRACTURE  1980s   PARTIAL KNEE ARTHROPLASTY Left 01/17/2017   Procedure: UNICOMPARTMENTAL LEFT KNEE;  Surgeon: Ernie Cough, MD;  Location: [facility];  Service: Orthopedics;   Laterality: Left;   POLYPECTOMY  10/12/2019   Procedure: POLYPECTOMY;  Surgeon: Rollin Dover, MD;  Location: [facility];  Service: Endoscopy;;   RADIOLOGY WITH ANESTHESIA N/A 08/11/2017   Procedure: MRI OF BRAIN WITH AND WITHOUT CONSTRAST, AND OF THE ORBIT WITH AND WITHOUT CONTRAST;  Surgeon: Radiologist, Medication, MD;  Location: [facility];  Service: Radiology;  Laterality: N/A;   RADIOLOGY WITH ANESTHESIA N/A 09/06/2017   Procedure: MRI OF BRAIN;  Surgeon: Radiologist, Medication, MD;  Location: [facility];  Service: Radiology;  Laterality: N/A;   RADIOLOGY WITH ANESTHESIA Right 03/03/2023   Procedure: MRI SHOULDER WITHOUT CONTRAST WITH ANESTHESIA;  Surgeon: Radiologist, Medication, MD;  Location: [facility];  Service: Radiology;  Laterality: Right;   RHINOPLASTY     SHOULDER ARTHROSCOPY WITH ROTATOR CUFF REPAIR Right 04/25/2023   Procedure: RIGHT SHOULDER ARTHROSCOPY WITH ROTATOR CUFF REPAIR, ACROMIOPLASTY;  Surgeon: Genelle Standing, MD;  Location: [facility];  Service: Orthopedics;  Laterality: Right;   TOTAL KNEE ARTHROPLASTY Right 09/30/2015   Procedure: TOTAL RIGHT KNEE ARTHROPLASTY;  Surgeon: Cough Ernie, MD;  Location: [facility];  Service: Orthopedics;  Laterality: Right;   VAGINAL HYSTERECTOMY     Patient Active Problem List   Diagnosis Date Noted   UTI (urinary tract infection) 10/07/2018   Sepsis (HCC) 10/07/2018   Hypokalemia  10/07/2018   S/P total thyroidectomy 10/07/2018   Rheumatoid arthritis (HCC) 09/18/2018   Papillary thyroid  carcinoma (HCC) 08/24/2018   TIA (transient ischemic attack) 02/14/2018   Hyperlipidemia    Ischemic optic neuropathy of right eye    Stroke (HCC) 09/04/2017   HTN (hypertension) 09/04/2017   DM (diabetes mellitus) (HCC) 09/04/2017   Polymyalgia rheumatica (HCC) 09/04/2017   Fibromyalgia 09/04/2017   S/P left UKR 01/17/2017   Overweight (BMI 25.0-29.9) 10/01/2015   S/P right TKA 09/30/2015    PCP: Lonell Collet DO  REFERRING PROVIDER: Genelle Standing,  MD   REFERRING DIAG: [phone] (ICD-10-CM) - Traumatic complete tear of right rotator cuff, initial encounter  THERAPY DIAG:  Right shoulder pain, unspecified chronicity  Muscle weakness (generalized)  Other symptoms and signs involving the musculoskeletal system  Traumatic complete tear of right rotator cuff, initial encounter  Rationale for Evaluation and Treatment: Rehabilitation  ONSET DATE: DOS 04/25/23  R RCR  SUBJECTIVE:                                                                                                                                                                                      SUBJECTIVE STATEMENT: Pt denies pain at entry. No soreness after last session    Hand dominance: Right  PERTINENT HISTORY: Hx cancer, fibromyalgia, PMR, HTN, HLD, RA, Hx CVA/TIA, DM  PAIN:  1-2/10 current R shoulder 3/10 worst pain 0/10 best   PRECAUTIONS: Shoulder R RCR  RED FLAGS: None   WEIGHT BEARING RESTRICTIONS:  NWB RUE  FALLS:  Has patient fallen in last 6 months? No   PLOF: Independent  PATIENT GOALS:get this arm working again    OBJECTIVE:    TODAY'S TREATMENT:                                                                                                                                          PROM all planes including IR behind back in sidelying  Pt performed: Supine serratus punch 2x10 with 1# Supine shoulder ABC x 1 reps with 1#  Supine flexion 2# 2x10 Sidelying abduction 2# 2x10 Sidelying ER 2# 2x10 Standing rows with retraction with GTB 2x10 Standing shoulder extension with retraction with GTB 2x10 Dumbbell reach to 2nd shelf in cabinet- 2# x10, 3# x10 Standing press out 3# 2x10 Standing active flexion 1# 2x10 Standing active abduction 1# 2x10   PATIENT EDUCATION:  Education details:  POC, exercise form, HEP, ROM findings, goal progress, and precautions/protocol.  PT answered pt's questions.  Person educated: Patient Education  method: Explanation, Demonstration, verbal and tactile cues, handout Education comprehension: verbalized understanding, returned demonstration, verbal and tactile cues required  HOME EXERCISE PROGRAM: Access Code: [email or web address] URL: [email or web address]    Updated HEP: - Standing Shoulder Flexion with Dumbbells  - 1 x daily - 3 x weekly - 2-3 sets - 10 reps - Standing Bilateral Shoulder Internal Rotation AAROM with Dowel  - 2 x daily - 7 x weekly - 2 sets - 10 reps    ASSESSMENT:  CLINICAL IMPRESSION: Pt with improved tolerance for IR behind back compared to previous sessions. Able to increase resistance with supine and sidelying AROM today with good tolerance, though fatiguing. She demonstrates good performance with overhead reaching to second shelf in cabinet with weight. Pt also able to progress to GTB with standing rows and extensions.   OBJECTIVE IMPAIRMENTS: decreased activity tolerance, decreased endurance, decreased mobility, decreased ROM, decreased strength, impaired flexibility, impaired UE functional use, improper body mechanics, postural dysfunction, and pain.   ACTIVITY LIMITATIONS: carrying, lifting, bending, bed mobility, bathing, dressing, self feeding, reach over head, hygiene/grooming, locomotion level, and caring for others  PARTICIPATION LIMITATIONS: meal prep, cleaning, laundry, driving, shopping, community activity, and yard work  PERSONAL FACTORS: 3+ comorbidities: Hx cancer, fibromyalgia, HTN, HLD, RA, Hx CVA/TIA, DM  are also affecting patient's functional outcome.   REHAB POTENTIAL: Good  CLINICAL DECISION MAKING: Stable/uncomplicated  EVALUATION COMPLEXITY: Low   GOALS: Goals reviewed with patient? Yes  SHORT TERM GOALS: Target date: 05/25/2023    Patient will be independent with HEP in order to improve functional outcomes. Baseline: Goal status: GOAL MET  10/24  2.  Patient will report at least 25% improvement in symptoms for improved  quality of life. Baseline:  Goal status: GOAL MET   10/24  3.  Patient will tolerate beginning to wean from sling with MD approval for improved functional use of RUE.  Baseline:  Goal status: goal met    LONG TERM GOALS: Target date: 08/25/2023    Patient will report at least 75% improvement in symptoms for improved quality of life. Baseline:  Goal status  ONGOING  2.  Patient will improve FOTO score by at least 32 points in order to indicate improved tolerance to activity. Baseline: 57 Goal status: progressing  12/12  3.  Patient will demonstrate at least 150 degrees in shoulder AROM in flexion for improved ability lift overhead. Baseline:  Goal status: goal met  12/12  4.  Patient will demonstrate grade of at least 4+/5 MMT grade in all tested musculature as evidence of improved strength to assist with lifting at home. Baseline:  Goal status: INITIAL  5.  Patient will be progressing with resisted strengthening in order to build strength for chores and exercise.  Baseline:  Goal status: INITIAL    PLAN:  PT FREQUENCY: 2x/wk  PT DURATION: 7 weeks  PLANNED INTERVENTIONS: Therapeutic exercises, Therapeutic activity, Neuromuscular re-education, Balance training, Gait training, Patient/Family education, Joint manipulation, Joint mobilization, Stair training, Orthotic/Fit training, DME instructions, Aquatic Therapy, Dry Needling, Electrical  stimulation, Spinal manipulation, Spinal mobilization, Cryotherapy, Moist heat, Compression bandaging, scar mobilization, Splintting, Taping, Traction, Ultrasound, Ionotophoresis 4mg /ml Dexamethasone , and Manual therapy   PLAN FOR NEXT SESSION: Continue to progress per Dr. Danetta RCR protocol.    Asberry Rodes, PTA  08/09/23 3:28 PM

## 2023-08-11 ENCOUNTER — Encounter (HOSPITAL_BASED_OUTPATIENT_CLINIC_OR_DEPARTMENT_OTHER): Payer: Self-pay

## 2023-08-11 ENCOUNTER — Ambulatory Visit (HOSPITAL_BASED_OUTPATIENT_CLINIC_OR_DEPARTMENT_OTHER): Payer: PPO | Attending: Orthopaedic Surgery

## 2023-08-11 DIAGNOSIS — M25511 Pain in right shoulder: Secondary | ICD-10-CM | POA: Diagnosis not present

## 2023-08-11 DIAGNOSIS — R29898 Other symptoms and signs involving the musculoskeletal system: Secondary | ICD-10-CM | POA: Insufficient documentation

## 2023-08-11 DIAGNOSIS — S46011A Strain of muscle(s) and tendon(s) of the rotator cuff of right shoulder, initial encounter: Secondary | ICD-10-CM | POA: Diagnosis not present

## 2023-08-11 DIAGNOSIS — M6281 Muscle weakness (generalized): Secondary | ICD-10-CM | POA: Diagnosis not present

## 2023-08-11 NOTE — Therapy (Signed)
 OUTPATIENT PHYSICAL THERAPY SHOULDER TREATMENT        Patient Name: Misty Morris MRN: [id number] DOB:1950-09-07, 73 y.o., female Today's Date: 08/11/2023  END OF SESSION:  PT End of Session - 08/11/23 1355     Visit Number 24    Number of Visits 29    Date for PT Re-Evaluation 08/25/23    Authorization Type [employer]    Progress Note Due on Visit 30    PT Start Time 1352    PT Stop Time 1430    PT Time Calculation (min) 38 min    Activity Tolerance Patient tolerated treatment well    Behavior During Therapy WFL for tasks assessed/performed                      Past Medical History:  Diagnosis Date   Cancer (HCC)    hx of thyroid  cancer   Fibromyalgia    Hypercholesterolemia    Hypertension    Hypothyroidism    thyroid  removed   Polymyalgia rheumatica (HCC)    Rheumatoid arthritis (HCC)    atypical/MD 08/2017 (02/14/2018)   Shingles    Stroke (HCC) 08/2017   no residual (02/14/2018)   TIA (transient ischemic attack) 02/14/2018   Type 2 diabetes, diet controlled (HCC)    Vision impairment    right eye   Past Surgical History:  Procedure Laterality Date   CATARACT EXTRACTION W/ INTRAOCULAR LENS  IMPLANT, BILATERAL     COLONOSCOPY WITH PROPOFOL  N/A 10/12/2019   Procedure: COLONOSCOPY WITH PROPOFOL ;  Surgeon: Rollin Dover, MD;  Location: [facility];  Service: Endoscopy;  Laterality: N/A;   COLONOSCOPY WITH PROPOFOL  N/A 12/24/2022   Procedure: COLONOSCOPY WITH PROPOFOL ;  Surgeon: Rollin Dover, MD;  Location: [facility];  Service: Gastroenterology;  Laterality: N/A;   HEMOSTASIS CLIP PLACEMENT  10/12/2019   Procedure: HEMOSTASIS CLIP PLACEMENT;  Surgeon: Rollin Dover, MD;  Location: [facility];  Service: Endoscopy;;   JOINT REPLACEMENT     OPEN REDUCTION LE FORT I FRACTURE  1980s   PARTIAL KNEE ARTHROPLASTY Left 01/17/2017   Procedure: UNICOMPARTMENTAL LEFT KNEE;  Surgeon: Ernie Cough, MD;  Location: [facility];  Service: Orthopedics;   Laterality: Left;   POLYPECTOMY  10/12/2019   Procedure: POLYPECTOMY;  Surgeon: Rollin Dover, MD;  Location: [facility];  Service: Endoscopy;;   RADIOLOGY WITH ANESTHESIA N/A 08/11/2017   Procedure: MRI OF BRAIN WITH AND WITHOUT CONSTRAST, AND OF THE ORBIT WITH AND WITHOUT CONTRAST;  Surgeon: Radiologist, Medication, MD;  Location: [facility];  Service: Radiology;  Laterality: N/A;   RADIOLOGY WITH ANESTHESIA N/A 09/06/2017   Procedure: MRI OF BRAIN;  Surgeon: Radiologist, Medication, MD;  Location: [facility];  Service: Radiology;  Laterality: N/A;   RADIOLOGY WITH ANESTHESIA Right 03/03/2023   Procedure: MRI SHOULDER WITHOUT CONTRAST WITH ANESTHESIA;  Surgeon: Radiologist, Medication, MD;  Location: [facility];  Service: Radiology;  Laterality: Right;   RHINOPLASTY     SHOULDER ARTHROSCOPY WITH ROTATOR CUFF REPAIR Right 04/25/2023   Procedure: RIGHT SHOULDER ARTHROSCOPY WITH ROTATOR CUFF REPAIR, ACROMIOPLASTY;  Surgeon: Genelle Standing, MD;  Location: [facility];  Service: Orthopedics;  Laterality: Right;   TOTAL KNEE ARTHROPLASTY Right 09/30/2015   Procedure: TOTAL RIGHT KNEE ARTHROPLASTY;  Surgeon: Cough Ernie, MD;  Location: [facility];  Service: Orthopedics;  Laterality: Right;   VAGINAL HYSTERECTOMY     Patient Active Problem List   Diagnosis Date Noted   UTI (urinary tract infection) 10/07/2018   Sepsis (HCC) 10/07/2018  Hypokalemia 10/07/2018   S/P total thyroidectomy 10/07/2018   Rheumatoid arthritis (HCC) 09/18/2018   Papillary thyroid  carcinoma (HCC) 08/24/2018   TIA (transient ischemic attack) 02/14/2018   Hyperlipidemia    Ischemic optic neuropathy of right eye    Stroke (HCC) 09/04/2017   HTN (hypertension) 09/04/2017   DM (diabetes mellitus) (HCC) 09/04/2017   Polymyalgia rheumatica (HCC) 09/04/2017   Fibromyalgia 09/04/2017   S/P left UKR 01/17/2017   Overweight (BMI 25.0-29.9) 10/01/2015   S/P right TKA 09/30/2015    PCP: Lonell Collet DO  REFERRING PROVIDER: Genelle Standing,  MD   REFERRING DIAG: [phone] (ICD-10-CM) - Traumatic complete tear of right rotator cuff, initial encounter  THERAPY DIAG:  Right shoulder pain, unspecified chronicity  Muscle weakness (generalized)  Other symptoms and signs involving the musculoskeletal system  Traumatic complete tear of right rotator cuff, initial encounter  Rationale for Evaluation and Treatment: Rehabilitation  ONSET DATE: DOS 04/25/23  R RCR  SUBJECTIVE:                                                                                                                                                                                      SUBJECTIVE STATEMENT: Pt reports increase in general pain level today. I don't think it related to the surgery.    Hand dominance: Right  PERTINENT HISTORY: Hx cancer, fibromyalgia, PMR, HTN, HLD, RA, Hx CVA/TIA, DM  PAIN:  1-2/10 current R shoulder 3/10 worst pain 0/10 best   PRECAUTIONS: Shoulder R RCR  RED FLAGS: None   WEIGHT BEARING RESTRICTIONS:  NWB RUE  FALLS:  Has patient fallen in last 6 months? No   PLOF: Independent  PATIENT GOALS:get this arm working again    OBJECTIVE:    TODAY'S TREATMENT:                                                                                                                                          PROM all planes including IR behind back in sidelying  Pt performed: Supine serratus punch 2x10 with 2# Supine  shoulder ABC x 1 reps with 2# Supine flexion 2# 2x10 Sidelying abduction 2# 2x10 Sidelying ER 2# 2x10 Standing rows with retraction with GTB 2x10 Standing shoulder extension with retraction with GTB 2x10 Dumbbell reach to 2nd shelf in cabinet- 2# x10, 3# x10 Standing press out 3# 2x10 Standing active flexion 1# 2x10 Standing active abduction 1# 2x10   PATIENT EDUCATION:  Education details:  POC, exercise form, HEP, ROM findings, goal progress, and precautions/protocol.  PT answered pt's questions.   Person educated: Patient Education method: Explanation, Demonstration, verbal and tactile cues, handout Education comprehension: verbalized understanding, returned demonstration, verbal and tactile cues required  HOME EXERCISE PROGRAM: Access Code: [email or web address] URL: [email or web address]    Updated HEP: - Standing Shoulder Flexion with Dumbbells  - 1 x daily - 3 x weekly - 2-3 sets - 10 reps - Standing Bilateral Shoulder Internal Rotation AAROM with Dowel  - 2 x daily - 7 x weekly - 2 sets - 10 reps    ASSESSMENT:  CLINICAL IMPRESSION: Mild tightness into passive flexion at end range today. She also remains tight into IR behind back. Tx time limited due to late arrival. Continues to do well with reisted strengthening. Pt did fatigue with exercises, though able to complete all. Will continue to progress as tolerated.    OBJECTIVE IMPAIRMENTS: decreased activity tolerance, decreased endurance, decreased mobility, decreased ROM, decreased strength, impaired flexibility, impaired UE functional use, improper body mechanics, postural dysfunction, and pain.   ACTIVITY LIMITATIONS: carrying, lifting, bending, bed mobility, bathing, dressing, self feeding, reach over head, hygiene/grooming, locomotion level, and caring for others  PARTICIPATION LIMITATIONS: meal prep, cleaning, laundry, driving, shopping, community activity, and yard work  PERSONAL FACTORS: 3+ comorbidities: Hx cancer, fibromyalgia, HTN, HLD, RA, Hx CVA/TIA, DM  are also affecting patient's functional outcome.   REHAB POTENTIAL: Good  CLINICAL DECISION MAKING: Stable/uncomplicated  EVALUATION COMPLEXITY: Low   GOALS: Goals reviewed with patient? Yes  SHORT TERM GOALS: Target date: 05/25/2023    Patient will be independent with HEP in order to improve functional outcomes. Baseline: Goal status: GOAL MET  10/24  2.  Patient will report at least 25% improvement in symptoms for improved quality of  life. Baseline:  Goal status: GOAL MET   10/24  3.  Patient will tolerate beginning to wean from sling with MD approval for improved functional use of RUE.  Baseline:  Goal status: goal met    LONG TERM GOALS: Target date: 08/25/2023    Patient will report at least 75% improvement in symptoms for improved quality of life. Baseline:  Goal status  ONGOING  2.  Patient will improve FOTO score by at least 32 points in order to indicate improved tolerance to activity. Baseline: 57 Goal status: progressing  12/12  3.  Patient will demonstrate at least 150 degrees in shoulder AROM in flexion for improved ability lift overhead. Baseline:  Goal status: goal met  12/12  4.  Patient will demonstrate grade of at least 4+/5 MMT grade in all tested musculature as evidence of improved strength to assist with lifting at home. Baseline:  Goal status: INITIAL  5.  Patient will be progressing with resisted strengthening in order to build strength for chores and exercise.  Baseline:  Goal status: INITIAL    PLAN:  PT FREQUENCY: 2x/wk  PT DURATION: 7 weeks  PLANNED INTERVENTIONS: Therapeutic exercises, Therapeutic activity, Neuromuscular re-education, Balance training, Gait training, Patient/Family education, Joint manipulation, Joint mobilization, Stair training, Orthotic/Fit training, DME instructions, Aquatic Therapy, Dry  Needling, Electrical stimulation, Spinal manipulation, Spinal mobilization, Cryotherapy, Moist heat, Compression bandaging, scar mobilization, Splintting, Taping, Traction, Ultrasound, Ionotophoresis 4mg /ml Dexamethasone , and Manual therapy   PLAN FOR NEXT SESSION: Continue to progress per Dr. Danetta RCR protocol.    Asberry Rodes, PTA  08/11/23 3:47 PM

## 2023-08-15 ENCOUNTER — Encounter (HOSPITAL_BASED_OUTPATIENT_CLINIC_OR_DEPARTMENT_OTHER): Payer: PPO | Admitting: Physical Therapy

## 2023-08-16 ENCOUNTER — Ambulatory Visit (HOSPITAL_BASED_OUTPATIENT_CLINIC_OR_DEPARTMENT_OTHER): Payer: PPO

## 2023-08-16 ENCOUNTER — Encounter (HOSPITAL_BASED_OUTPATIENT_CLINIC_OR_DEPARTMENT_OTHER): Payer: Self-pay

## 2023-08-16 DIAGNOSIS — S46011A Strain of muscle(s) and tendon(s) of the rotator cuff of right shoulder, initial encounter: Secondary | ICD-10-CM

## 2023-08-16 DIAGNOSIS — R29898 Other symptoms and signs involving the musculoskeletal system: Secondary | ICD-10-CM

## 2023-08-16 DIAGNOSIS — M25511 Pain in right shoulder: Secondary | ICD-10-CM

## 2023-08-16 DIAGNOSIS — M6281 Muscle weakness (generalized): Secondary | ICD-10-CM

## 2023-08-16 NOTE — Therapy (Signed)
 OUTPATIENT PHYSICAL THERAPY SHOULDER TREATMENT        Patient Name: Misty Morris MRN: [id number] DOB:[birth year], 73 y.o., female Today's Date: 08/16/2023  END OF SESSION:  PT End of Session - 08/16/23 1626     Visit Number 25    Number of Visits 29    Date for PT Re-Evaluation 08/25/23    Authorization Type [employer]    Progress Note Due on Visit 30    PT Start Time 1528    PT Stop Time 1622    PT Time Calculation (min) 54 min    Activity Tolerance Patient tolerated treatment well    Behavior During Therapy WFL for tasks assessed/performed                       Past Medical History:  Diagnosis Date   Cancer (HCC)    hx of thyroid  cancer   Fibromyalgia    Hypercholesterolemia    Hypertension    Hypothyroidism    thyroid  removed   Polymyalgia rheumatica (HCC)    Rheumatoid arthritis (HCC)    atypical/MD 08/2017 (02/14/2018)   Shingles    Stroke (HCC) 08/2017   no residual (02/14/2018)   TIA (transient ischemic attack) 02/14/2018   Type 2 diabetes, diet controlled (HCC)    Vision impairment    right eye   Past Surgical History:  Procedure Laterality Date   CATARACT EXTRACTION W/ INTRAOCULAR LENS  IMPLANT, BILATERAL     COLONOSCOPY WITH PROPOFOL  N/A 10/12/2019   Procedure: COLONOSCOPY WITH PROPOFOL ;  Surgeon: Rollin Dover, MD;  Location: [facility];  Service: Endoscopy;  Laterality: N/A;   COLONOSCOPY WITH PROPOFOL  N/A 12/24/2022   Procedure: COLONOSCOPY WITH PROPOFOL ;  Surgeon: Rollin Dover, MD;  Location: [facility];  Service: Gastroenterology;  Laterality: N/A;   HEMOSTASIS CLIP PLACEMENT  10/12/2019   Procedure: HEMOSTASIS CLIP PLACEMENT;  Surgeon: Rollin Dover, MD;  Location: [facility];  Service: Endoscopy;;   JOINT REPLACEMENT     OPEN REDUCTION LE FORT I FRACTURE  1980s   PARTIAL KNEE ARTHROPLASTY Left 01/17/2017   Procedure: UNICOMPARTMENTAL LEFT KNEE;  Surgeon: Ernie Cough, MD;  Location: [facility];  Service: Orthopedics;   Laterality: Left;   POLYPECTOMY  10/12/2019   Procedure: POLYPECTOMY;  Surgeon: Rollin Dover, MD;  Location: [facility];  Service: Endoscopy;;   RADIOLOGY WITH ANESTHESIA N/A 08/11/2017   Procedure: MRI OF BRAIN WITH AND WITHOUT CONSTRAST, AND OF THE ORBIT WITH AND WITHOUT CONTRAST;  Surgeon: Radiologist, Medication, MD;  Location: [facility];  Service: Radiology;  Laterality: N/A;   RADIOLOGY WITH ANESTHESIA N/A 09/06/2017   Procedure: MRI OF BRAIN;  Surgeon: Radiologist, Medication, MD;  Location: [facility];  Service: Radiology;  Laterality: N/A;   RADIOLOGY WITH ANESTHESIA Right 03/03/2023   Procedure: MRI SHOULDER WITHOUT CONTRAST WITH ANESTHESIA;  Surgeon: Radiologist, Medication, MD;  Location: [facility];  Service: Radiology;  Laterality: Right;   RHINOPLASTY     SHOULDER ARTHROSCOPY WITH ROTATOR CUFF REPAIR Right 04/25/2023   Procedure: RIGHT SHOULDER ARTHROSCOPY WITH ROTATOR CUFF REPAIR, ACROMIOPLASTY;  Surgeon: Genelle Standing, MD;  Location: [facility];  Service: Orthopedics;  Laterality: Right;   TOTAL KNEE ARTHROPLASTY Right 09/30/2015   Procedure: TOTAL RIGHT KNEE ARTHROPLASTY;  Surgeon: Cough Ernie, MD;  Location: [facility];  Service: Orthopedics;  Laterality: Right;   VAGINAL HYSTERECTOMY     Patient Active Problem List   Diagnosis Date Noted   UTI (urinary tract infection) 10/07/2018   Sepsis (HCC) 10/07/2018  Hypokalemia 10/07/2018   S/P total thyroidectomy 10/07/2018   Rheumatoid arthritis (HCC) 09/18/2018   Papillary thyroid  carcinoma (HCC) 08/24/2018   TIA (transient ischemic attack) 02/14/2018   Hyperlipidemia    Ischemic optic neuropathy of right eye    Stroke (HCC) 09/04/2017   HTN (hypertension) 09/04/2017   DM (diabetes mellitus) (HCC) 09/04/2017   Polymyalgia rheumatica (HCC) 09/04/2017   Fibromyalgia 09/04/2017   S/P left UKR 01/17/2017   Overweight (BMI 25.0-29.9) 10/01/2015   S/P right TKA 09/30/2015    PCP: Lonell Collet DO  REFERRING PROVIDER: Genelle Standing,  MD   REFERRING DIAG: [phone] (ICD-10-CM) - Traumatic complete tear of right rotator cuff, initial encounter  THERAPY DIAG:  Right shoulder pain, unspecified chronicity  Muscle weakness (generalized)  Other symptoms and signs involving the musculoskeletal system  Traumatic complete tear of right rotator cuff, initial encounter  Rationale for Evaluation and Treatment: Rehabilitation  ONSET DATE: DOS 04/25/23  R RCR  SUBJECTIVE:                                                                                                                                                                                      SUBJECTIVE STATEMENT: Pt reports her shoulder is doing well. Able to reach with ADLs and can brush her hair.     Hand dominance: Right  PERTINENT HISTORY: Hx cancer, fibromyalgia, PMR, HTN, HLD, RA, Hx CVA/TIA, DM  PAIN:  1-2/10 current R shoulder 3/10 worst pain 0/10 best   PRECAUTIONS: Shoulder R RCR  RED FLAGS: None   WEIGHT BEARING RESTRICTIONS:  NWB RUE  FALLS:  Has patient fallen in last 6 months? No   PLOF: Independent  PATIENT GOALS:get this arm working again    OBJECTIVE:    TODAY'S TREATMENT:                                                                                                                                          PROM all planes including IR behind back in sidelying  Pt performed: Supine shoulder ABC x 1  reps with 2# Supine flexion 2# 2x10 Sidelying abduction 2# 2x10 Sidelying ER 2# 2x10 Standing rows with retraction with GTB 2x15 Standing shoulder extension with retraction with GTB 2x15 Dumbbell reach to 2nd shelf in cabinet-, 3# 2x10 Standing press out 3# 2x10 Standing active flexion 2# 2x10 Standing active abduction 2# 2x10 Standing PNF D2 flexion 2# 2x10   PATIENT EDUCATION:  Education details:  POC, exercise form, HEP, ROM findings, goal progress, and precautions/protocol.  PT answered pt's questions.  Person  educated: Patient Education method: Explanation, Demonstration, verbal and tactile cues, handout Education comprehension: verbalized understanding, returned demonstration, verbal and tactile cues required  HOME EXERCISE PROGRAM: Access Code: [email or web address] URL: [email or web address]    Updated HEP: - Standing Shoulder Flexion with Dumbbells  - 1 x daily - 3 x weekly - 2-3 sets - 10 reps - Standing Bilateral Shoulder Internal Rotation AAROM with Dowel  - 2 x daily - 7 x weekly - 2 sets - 10 reps    ASSESSMENT:  CLINICAL IMPRESSION: Pt demonstrates overall excellent available ROM, though remains tight with IR behind back. Continued  to progress strengthening without complaint of pain, though she does experience muscular fatigue. Able to increase resistance with standing active flexion and abduction. Pt progressing well. Will monitor biceps tightness and soreness.   OBJECTIVE IMPAIRMENTS: decreased activity tolerance, decreased endurance, decreased mobility, decreased ROM, decreased strength, impaired flexibility, impaired UE functional use, improper body mechanics, postural dysfunction, and pain.   ACTIVITY LIMITATIONS: carrying, lifting, bending, bed mobility, bathing, dressing, self feeding, reach over head, hygiene/grooming, locomotion level, and caring for others  PARTICIPATION LIMITATIONS: meal prep, cleaning, laundry, driving, shopping, community activity, and yard work  PERSONAL FACTORS: 3+ comorbidities: Hx cancer, fibromyalgia, HTN, HLD, RA, Hx CVA/TIA, DM  are also affecting patient's functional outcome.   REHAB POTENTIAL: Good  CLINICAL DECISION MAKING: Stable/uncomplicated  EVALUATION COMPLEXITY: Low   GOALS: Goals reviewed with patient? Yes  SHORT TERM GOALS: Target date: 05/25/2023    Patient will be independent with HEP in order to improve functional outcomes. Baseline: Goal status: GOAL MET  10/24  2.  Patient will report at least 25% improvement in  symptoms for improved quality of life. Baseline:  Goal status: GOAL MET   10/24  3.  Patient will tolerate beginning to wean from sling with MD approval for improved functional use of RUE.  Baseline:  Goal status: goal met    LONG TERM GOALS: Target date: 08/25/2023    Patient will report at least 75% improvement in symptoms for improved quality of life. Baseline:  Goal status  ONGOING  2.  Patient will improve FOTO score by at least 32 points in order to indicate improved tolerance to activity. Baseline: 57 Goal status: progressing  12/12  3.  Patient will demonstrate at least 150 degrees in shoulder AROM in flexion for improved ability lift overhead. Baseline:  Goal status: goal met  12/12  4.  Patient will demonstrate grade of at least 4+/5 MMT grade in all tested musculature as evidence of improved strength to assist with lifting at home. Baseline:  Goal status: INITIAL  5.  Patient will be progressing with resisted strengthening in order to build strength for chores and exercise.  Baseline:  Goal status: INITIAL    PLAN:  PT FREQUENCY: 2x/wk  PT DURATION: 7 weeks  PLANNED INTERVENTIONS: Therapeutic exercises, Therapeutic activity, Neuromuscular re-education, Balance training, Gait training, Patient/Family education, Joint manipulation, Joint mobilization, Stair training, Orthotic/Fit training, DME instructions, Aquatic Therapy, Dry Needling,  Electrical stimulation, Spinal manipulation, Spinal mobilization, Cryotherapy, Moist heat, Compression bandaging, scar mobilization, Splintting, Taping, Traction, Ultrasound, Ionotophoresis 4mg /ml Dexamethasone , and Manual therapy   PLAN FOR NEXT SESSION: Continue to progress per Dr. Danetta RCR protocol.    Asberry Rodes, PTA  08/16/23 4:29 PM

## 2023-08-18 ENCOUNTER — Ambulatory Visit (HOSPITAL_BASED_OUTPATIENT_CLINIC_OR_DEPARTMENT_OTHER): Payer: PPO | Admitting: Physical Therapy

## 2023-08-18 DIAGNOSIS — M25511 Pain in right shoulder: Secondary | ICD-10-CM

## 2023-08-18 DIAGNOSIS — M6281 Muscle weakness (generalized): Secondary | ICD-10-CM

## 2023-08-18 NOTE — Therapy (Signed)
 OUTPATIENT PHYSICAL THERAPY SHOULDER TREATMENT        Patient Name: Misty Morris MRN: [id number] DOB:09/20/1950, 73 y.o., female Today's Date: 08/19/2023  END OF SESSION:  PT End of Session - 08/18/23 1115     Visit Number 26    Number of Visits 29    Date for PT Re-Evaluation 08/25/23    Authorization Type [employer]    PT Start Time 1112    PT Stop Time 1155    PT Time Calculation (min) 43 min    Activity Tolerance Patient tolerated treatment well    Behavior During Therapy WFL for tasks assessed/performed                       Past Medical History:  Diagnosis Date   Cancer (HCC)    hx of thyroid  cancer   Fibromyalgia    Hypercholesterolemia    Hypertension    Hypothyroidism    thyroid  removed   Polymyalgia rheumatica (HCC)    Rheumatoid arthritis (HCC)    atypical/MD 08/2017 (02/14/2018)   Shingles    Stroke (HCC) 08/2017   no residual (02/14/2018)   TIA (transient ischemic attack) 02/14/2018   Type 2 diabetes, diet controlled (HCC)    Vision impairment    right eye   Past Surgical History:  Procedure Laterality Date   CATARACT EXTRACTION W/ INTRAOCULAR LENS  IMPLANT, BILATERAL     COLONOSCOPY WITH PROPOFOL  N/A 10/12/2019   Procedure: COLONOSCOPY WITH PROPOFOL ;  Surgeon: Rollin Dover, MD;  Location: [facility];  Service: Endoscopy;  Laterality: N/A;   COLONOSCOPY WITH PROPOFOL  N/A 12/24/2022   Procedure: COLONOSCOPY WITH PROPOFOL ;  Surgeon: Rollin Dover, MD;  Location: [facility];  Service: Gastroenterology;  Laterality: N/A;   HEMOSTASIS CLIP PLACEMENT  10/12/2019   Procedure: HEMOSTASIS CLIP PLACEMENT;  Surgeon: Rollin Dover, MD;  Location: [facility];  Service: Endoscopy;;   JOINT REPLACEMENT     OPEN REDUCTION LE FORT I FRACTURE  1980s   PARTIAL KNEE ARTHROPLASTY Left 01/17/2017   Procedure: UNICOMPARTMENTAL LEFT KNEE;  Surgeon: Ernie Cough, MD;  Location: [facility];  Service: Orthopedics;  Laterality: Left;   POLYPECTOMY   10/12/2019   Procedure: POLYPECTOMY;  Surgeon: Rollin Dover, MD;  Location: [facility];  Service: Endoscopy;;   RADIOLOGY WITH ANESTHESIA N/A 08/11/2017   Procedure: MRI OF BRAIN WITH AND WITHOUT CONSTRAST, AND OF THE ORBIT WITH AND WITHOUT CONTRAST;  Surgeon: Radiologist, Medication, MD;  Location: [facility];  Service: Radiology;  Laterality: N/A;   RADIOLOGY WITH ANESTHESIA N/A 09/06/2017   Procedure: MRI OF BRAIN;  Surgeon: Radiologist, Medication, MD;  Location: [facility];  Service: Radiology;  Laterality: N/A;   RADIOLOGY WITH ANESTHESIA Right 03/03/2023   Procedure: MRI SHOULDER WITHOUT CONTRAST WITH ANESTHESIA;  Surgeon: Radiologist, Medication, MD;  Location: [facility];  Service: Radiology;  Laterality: Right;   RHINOPLASTY     SHOULDER ARTHROSCOPY WITH ROTATOR CUFF REPAIR Right 04/25/2023   Procedure: RIGHT SHOULDER ARTHROSCOPY WITH ROTATOR CUFF REPAIR, ACROMIOPLASTY;  Surgeon: Genelle Standing, MD;  Location: [facility];  Service: Orthopedics;  Laterality: Right;   TOTAL KNEE ARTHROPLASTY Right 09/30/2015   Procedure: TOTAL RIGHT KNEE ARTHROPLASTY;  Surgeon: Cough Ernie, MD;  Location: [facility];  Service: Orthopedics;  Laterality: Right;   VAGINAL HYSTERECTOMY     Patient Active Problem List   Diagnosis Date Noted   UTI (urinary tract infection) 10/07/2018   Sepsis (HCC) 10/07/2018   Hypokalemia 10/07/2018   S/P total thyroidectomy 10/07/2018  Rheumatoid arthritis (HCC) 09/18/2018   Papillary thyroid  carcinoma (HCC) 08/24/2018   TIA (transient ischemic attack) 02/14/2018   Hyperlipidemia    Ischemic optic neuropathy of right eye    Stroke (HCC) 09/04/2017   HTN (hypertension) 09/04/2017   DM (diabetes mellitus) (HCC) 09/04/2017   Polymyalgia rheumatica (HCC) 09/04/2017   Fibromyalgia 09/04/2017   S/P left UKR 01/17/2017   Overweight (BMI 25.0-29.9) 10/01/2015   S/P right TKA 09/30/2015    PCP: Lonell Collet DO  REFERRING PROVIDER: Genelle Standing, MD   REFERRING DIAG: [phone] (ICD-10-CM)  - Traumatic complete tear of right rotator cuff, initial encounter  THERAPY DIAG:  Right shoulder pain, unspecified chronicity  Muscle weakness (generalized)  Rationale for Evaluation and Treatment: Rehabilitation  ONSET DATE: DOS 04/25/23  R RCR  SUBJECTIVE:                                                                                                                                                                                      SUBJECTIVE STATEMENT: Pt is 16 weeks and 3 days post op.  Pt denies any adverse effects after prior Rx; she had soreness and fatigue after Rx.  Pt is limited with reaching behind back.  Pt states she is able to do her daily activities well.  Pt is able to reach into an overhead cabinet and put her dishes in the cabinet.  Pt able to carry groceries.  Pt has pain with reaching across body and behind back.      Hand dominance: Right  PERTINENT HISTORY: Hx cancer, fibromyalgia, PMR, HTN, HLD, RA, Hx CVA/TIA, DM  PAIN:  1/10 current R shoulder 3/10 worst pain 0/10 best   PRECAUTIONS: Shoulder R RCR  RED FLAGS: None   WEIGHT BEARING RESTRICTIONS:  NWB RUE  FALLS:  Has patient fallen in last 6 months? No   PLOF: Independent  PATIENT GOALS:get this arm working again    OBJECTIVE:    TODAY'S TREATMENT:  Pt received R shoulder PROM in flexion, abd, ER, and IR per pt and tissue tolerance in supine  Pt performed: Supine serratus punches 2# 2x10 Supine shoulder ABC x 1 rep with 2# Sidelying ER 2# 2x10 Standing jobe's flexion 1# 2x10 Standing scaption x 10, 1# x10 Standing rows with retraction with GTB 2x15 Standing shoulder extension with retraction with GTB 2x15 ER/IR with arm at side with RTB 2x10 each 4D ball rolls on wall x 10 each    PATIENT EDUCATION:  Education details:  POC, exercise form,  HEP, ROM findings, goal progress, and precautions/protocol.  PT answered pt's questions.  Person educated: Patient Education method: Explanation, Demonstration, verbal and tactile cues, handout Education comprehension: verbalized understanding, returned demonstration, verbal and tactile cues required  HOME EXERCISE PROGRAM: Access Code: [email or web address] URL: [email or web address]    Updated HEP: - Standing Shoulder Flexion with Dumbbells  - 1 x daily - 3 x weekly - 2-3 sets - 10 reps - Standing Bilateral Shoulder Internal Rotation AAROM with Dowel  - 2 x daily - 7 x weekly - 2 sets - 10 reps    ASSESSMENT:  CLINICAL IMPRESSION: Pt is improving with function as evidenced by subjective reports including reaching into overhead cabinet, putting dishes away, and carrying groceries.  Pt is improving with R shoulder strength as evidenced by performance of resistance exercises.  Pt performed exercises per protocol well,  She responded well to Rx reporting no increased pain after Rx.  She should benefit from cont skilled PT services to address ongoing goals, improve strength, and to restore desired level of function.   OBJECTIVE IMPAIRMENTS: decreased activity tolerance, decreased endurance, decreased mobility, decreased ROM, decreased strength, impaired flexibility, impaired UE functional use, improper body mechanics, postural dysfunction, and pain.   ACTIVITY LIMITATIONS: carrying, lifting, bending, bed mobility, bathing, dressing, self feeding, reach over head, hygiene/grooming, locomotion level, and caring for others  PARTICIPATION LIMITATIONS: meal prep, cleaning, laundry, driving, shopping, community activity, and yard work  PERSONAL FACTORS: 3+ comorbidities: Hx cancer, fibromyalgia, HTN, HLD, RA, Hx CVA/TIA, DM  are also affecting patient's functional outcome.   REHAB POTENTIAL: Good  CLINICAL DECISION MAKING: Stable/uncomplicated  EVALUATION COMPLEXITY: Low   GOALS: Goals  reviewed with patient? Yes  SHORT TERM GOALS: Target date: 05/25/2023    Patient will be independent with HEP in order to improve functional outcomes. Baseline: Goal status: GOAL MET  10/24  2.  Patient will report at least 25% improvement in symptoms for improved quality of life. Baseline:  Goal status: GOAL MET   10/24  3.  Patient will tolerate beginning to wean from sling with MD approval for improved functional use of RUE.  Baseline:  Goal status: goal met    LONG TERM GOALS: Target date: 08/25/2023    Patient will report at least 75% improvement in symptoms for improved quality of life. Baseline:  Goal status  ONGOING  2.  Patient will improve FOTO score by at least 32 points in order to indicate improved tolerance to activity. Baseline: 57 Goal status: progressing  12/12  3.  Patient will demonstrate at least 150 degrees in shoulder AROM in flexion for improved ability lift overhead. Baseline:  Goal status: goal met  12/12  4.  Patient will demonstrate grade of at least 4+/5 MMT grade in all tested musculature as evidence of improved strength to assist with lifting at home. Baseline:  Goal status: INITIAL  5.  Patient will be progressing with resisted strengthening in order to build strength  for chores and exercise.  Baseline:  Goal status: INITIAL    PLAN:  PT FREQUENCY: 2x/wk  PT DURATION: 7 weeks  PLANNED INTERVENTIONS: Therapeutic exercises, Therapeutic activity, Neuromuscular re-education, Balance training, Gait training, Patient/Family education, Joint manipulation, Joint mobilization, Stair training, Orthotic/Fit training, DME instructions, Aquatic Therapy, Dry Needling, Electrical stimulation, Spinal manipulation, Spinal mobilization, Cryotherapy, Moist heat, Compression bandaging, scar mobilization, Splintting, Taping, Traction, Ultrasound, Ionotophoresis 4mg /ml Dexamethasone , and Manual therapy   PLAN FOR NEXT SESSION: Continue to progress per Dr.  Danetta RCR protocol.    Leigh Minerva III PT, DPT 08/19/23 1:36 PM

## 2023-08-19 ENCOUNTER — Encounter (HOSPITAL_BASED_OUTPATIENT_CLINIC_OR_DEPARTMENT_OTHER): Payer: Self-pay | Admitting: Physical Therapy

## 2023-08-21 NOTE — Therapy (Signed)
 OUTPATIENT PHYSICAL THERAPY SHOULDER TREATMENT        Patient Name: Misty Morris MRN: [id number] DOB:1950-09-22, 73 y.o., female Today's Date: 08/23/2023  END OF SESSION:  PT End of Session - 08/22/23 1323     Visit Number 27    Number of Visits 29    Date for PT Re-Evaluation 08/25/23    Authorization Type [employer]    PT Start Time 1321    PT Stop Time 1405    PT Time Calculation (min) 44 min    Activity Tolerance Patient tolerated treatment well    Behavior During Therapy WFL for tasks assessed/performed                        Past Medical History:  Diagnosis Date   Cancer (HCC)    hx of thyroid  cancer   Fibromyalgia    Hypercholesterolemia    Hypertension    Hypothyroidism    thyroid  removed   Polymyalgia rheumatica (HCC)    Rheumatoid arthritis (HCC)    atypical/MD 08/2017 (02/14/2018)   Shingles    Stroke (HCC) 08/2017   no residual (02/14/2018)   TIA (transient ischemic attack) 02/14/2018   Type 2 diabetes, diet controlled (HCC)    Vision impairment    right eye   Past Surgical History:  Procedure Laterality Date   CATARACT EXTRACTION W/ INTRAOCULAR LENS  IMPLANT, BILATERAL     COLONOSCOPY WITH PROPOFOL  N/A 10/12/2019   Procedure: COLONOSCOPY WITH PROPOFOL ;  Surgeon: Rollin Dover, MD;  Location: [facility];  Service: Endoscopy;  Laterality: N/A;   COLONOSCOPY WITH PROPOFOL  N/A 12/24/2022   Procedure: COLONOSCOPY WITH PROPOFOL ;  Surgeon: Rollin Dover, MD;  Location: [facility];  Service: Gastroenterology;  Laterality: N/A;   HEMOSTASIS CLIP PLACEMENT  10/12/2019   Procedure: HEMOSTASIS CLIP PLACEMENT;  Surgeon: Rollin Dover, MD;  Location: [facility];  Service: Endoscopy;;   JOINT REPLACEMENT     OPEN REDUCTION LE FORT I FRACTURE  1980s   PARTIAL KNEE ARTHROPLASTY Left 01/17/2017   Procedure: UNICOMPARTMENTAL LEFT KNEE;  Surgeon: Ernie Cough, MD;  Location: [facility];  Service: Orthopedics;  Laterality: Left;   POLYPECTOMY   10/12/2019   Procedure: POLYPECTOMY;  Surgeon: Rollin Dover, MD;  Location: [facility];  Service: Endoscopy;;   RADIOLOGY WITH ANESTHESIA N/A 08/11/2017   Procedure: MRI OF BRAIN WITH AND WITHOUT CONSTRAST, AND OF THE ORBIT WITH AND WITHOUT CONTRAST;  Surgeon: Radiologist, Medication, MD;  Location: [facility];  Service: Radiology;  Laterality: N/A;   RADIOLOGY WITH ANESTHESIA N/A 09/06/2017   Procedure: MRI OF BRAIN;  Surgeon: Radiologist, Medication, MD;  Location: [facility];  Service: Radiology;  Laterality: N/A;   RADIOLOGY WITH ANESTHESIA Right 03/03/2023   Procedure: MRI SHOULDER WITHOUT CONTRAST WITH ANESTHESIA;  Surgeon: Radiologist, Medication, MD;  Location: [facility];  Service: Radiology;  Laterality: Right;   RHINOPLASTY     SHOULDER ARTHROSCOPY WITH ROTATOR CUFF REPAIR Right 04/25/2023   Procedure: RIGHT SHOULDER ARTHROSCOPY WITH ROTATOR CUFF REPAIR, ACROMIOPLASTY;  Surgeon: Genelle Standing, MD;  Location: [facility];  Service: Orthopedics;  Laterality: Right;   TOTAL KNEE ARTHROPLASTY Right 09/30/2015   Procedure: TOTAL RIGHT KNEE ARTHROPLASTY;  Surgeon: Cough Ernie, MD;  Location: [facility];  Service: Orthopedics;  Laterality: Right;   VAGINAL HYSTERECTOMY     Patient Active Problem List   Diagnosis Date Noted   UTI (urinary tract infection) 10/07/2018   Sepsis (HCC) 10/07/2018   Hypokalemia 10/07/2018   S/P total thyroidectomy  10/07/2018   Rheumatoid arthritis (HCC) 09/18/2018   Papillary thyroid  carcinoma (HCC) 08/24/2018   TIA (transient ischemic attack) 02/14/2018   Hyperlipidemia    Ischemic optic neuropathy of right eye    Stroke (HCC) 09/04/2017   HTN (hypertension) 09/04/2017   DM (diabetes mellitus) (HCC) 09/04/2017   Polymyalgia rheumatica (HCC) 09/04/2017   Fibromyalgia 09/04/2017   S/P left UKR 01/17/2017   Overweight (BMI 25.0-29.9) 10/01/2015   S/P right TKA 09/30/2015    PCP: Lonell Collet DO  REFERRING PROVIDER: Genelle Standing, MD   REFERRING DIAG: [phone] (ICD-10-CM)  - Traumatic complete tear of right rotator cuff, initial encounter  THERAPY DIAG:  Right shoulder pain, unspecified chronicity  Muscle weakness (generalized)  Other symptoms and signs involving the musculoskeletal system  Rationale for Evaluation and Treatment: Rehabilitation  ONSET DATE: DOS 04/25/23  R RCR  SUBJECTIVE:                                                                                                                                                                                      SUBJECTIVE STATEMENT: Pt is 17 weeks s/p R shoulder arthroscopic RCR,extensive debridement, and subacromial decompression.  Pt denies any adverse effects after prior Rx.  Pt is limited with reaching behind back.  Pt states she is able to do her daily activities well.  Pt is able to reach into an overhead cabinet and put her dishes in the cabinet.  Pt able to carry groceries.  Pt has pain with reaching across body and behind back.     Hand dominance: Right  PERTINENT HISTORY: Hx cancer, fibromyalgia, PMR, HTN, HLD, RA, Hx CVA/TIA, DM  PAIN:  1-2/10 current R shoulder   PRECAUTIONS: Shoulder R RCR  RED FLAGS: None   WEIGHT BEARING RESTRICTIONS:  NWB RUE  FALLS:  Has patient fallen in last 6 months? No   PLOF: Independent  PATIENT GOALS:get this arm working again    OBJECTIVE:    TODAY'S TREATMENT:  Pt received R shoulder PROM in flexion, abd, ER, and IR per pt and tissue tolerance in supine  Pt performed: Supine serratus punches 2# 2x10 Supine shoulder ABC x 1 rep with 2# Sidelying ER 2# 2x10 Standing jobe's flexion 1# x10, 2# 2x10 Standing scaption 1# 3x10 Standing rows with retraction with GTB 2x15 Standing shoulder extension with retraction with GTB 2x15 ER/IR with arm at side with RTB 2x10 each 4D ball rolls on wall x 10  each    PATIENT EDUCATION:  Education details:  POC, exercise form, HEP, and precautions/protocol.  PT answered pt's questions.  Person educated: Patient Education method: Explanation, Demonstration, verbal and tactile cues, handout Education comprehension: verbalized understanding, returned demonstration, verbal and tactile cues required  HOME EXERCISE PROGRAM: Access Code: [email or web address] URL: [email or web address]      ASSESSMENT:  CLINICAL IMPRESSION: Pt is improving with function as evidenced by subjective reports.  Pt is improving with R shoulder strength as evidenced by performance of resistance exercises per protocol.  Pt performed exercises well.  Pt is compliant with HEP.  Pt tolerated treatment well and reports no increased pain after Rx.  PT will assess ROM, strength, and goals next visit.    OBJECTIVE IMPAIRMENTS: decreased activity tolerance, decreased endurance, decreased mobility, decreased ROM, decreased strength, impaired flexibility, impaired UE functional use, improper body mechanics, postural dysfunction, and pain.   ACTIVITY LIMITATIONS: carrying, lifting, bending, bed mobility, bathing, dressing, self feeding, reach over head, hygiene/grooming, locomotion level, and caring for others  PARTICIPATION LIMITATIONS: meal prep, cleaning, laundry, driving, shopping, community activity, and yard work  PERSONAL FACTORS: 3+ comorbidities: Hx cancer, fibromyalgia, HTN, HLD, RA, Hx CVA/TIA, DM  are also affecting patient's functional outcome.   REHAB POTENTIAL: Good  CLINICAL DECISION MAKING: Stable/uncomplicated  EVALUATION COMPLEXITY: Low   GOALS: Goals reviewed with patient? Yes  SHORT TERM GOALS: Target date: 05/25/2023    Patient will be independent with HEP in order to improve functional outcomes. Baseline: Goal status: GOAL MET  10/24  2.  Patient will report at least 25% improvement in symptoms for improved quality of life. Baseline:  Goal  status: GOAL MET   10/24  3.  Patient will tolerate beginning to wean from sling with MD approval for improved functional use of RUE.  Baseline:  Goal status: goal met    LONG TERM GOALS: Target date: 08/25/2023    Patient will report at least 75% improvement in symptoms for improved quality of life. Baseline:  Goal status  ONGOING  2.  Patient will improve FOTO score by at least 32 points in order to indicate improved tolerance to activity. Baseline: 57 Goal status: progressing  12/12  3.  Patient will demonstrate at least 150 degrees in shoulder AROM in flexion for improved ability lift overhead. Baseline:  Goal status: goal met  12/12  4.  Patient will demonstrate grade of at least 4+/5 MMT grade in all tested musculature as evidence of improved strength to assist with lifting at home. Baseline:  Goal status: INITIAL  5.  Patient will be progressing with resisted strengthening in order to build strength for chores and exercise.  Baseline:  Goal status: INITIAL    PLAN:  PT FREQUENCY: 2x/wk  PT DURATION: 7 weeks  PLANNED INTERVENTIONS: Therapeutic exercises, Therapeutic activity, Neuromuscular re-education, Balance training, Gait training, Patient/Family education, Joint manipulation, Joint mobilization, Stair training, Orthotic/Fit training, DME instructions, Aquatic Therapy, Dry Needling, Electrical stimulation, Spinal manipulation, Spinal mobilization, Cryotherapy, Moist heat, Compression bandaging, scar mobilization, Splintting, Taping, Traction,  Ultrasound, Ionotophoresis 4mg /ml Dexamethasone , and Manual therapy   PLAN FOR NEXT SESSION: Continue to progress per Dr. Danetta RCR protocol.  PN next visit.    Leigh Minerva III PT, DPT 08/23/23 8:37 AM

## 2023-08-22 ENCOUNTER — Ambulatory Visit (HOSPITAL_BASED_OUTPATIENT_CLINIC_OR_DEPARTMENT_OTHER): Payer: PPO | Admitting: Physical Therapy

## 2023-08-22 ENCOUNTER — Encounter (HOSPITAL_BASED_OUTPATIENT_CLINIC_OR_DEPARTMENT_OTHER): Payer: Self-pay | Admitting: Physical Therapy

## 2023-08-22 DIAGNOSIS — M25511 Pain in right shoulder: Secondary | ICD-10-CM | POA: Diagnosis not present

## 2023-08-22 DIAGNOSIS — R29898 Other symptoms and signs involving the musculoskeletal system: Secondary | ICD-10-CM

## 2023-08-22 DIAGNOSIS — M6281 Muscle weakness (generalized): Secondary | ICD-10-CM

## 2023-08-24 ENCOUNTER — Ambulatory Visit (HOSPITAL_BASED_OUTPATIENT_CLINIC_OR_DEPARTMENT_OTHER): Payer: PPO | Admitting: Physical Therapy

## 2023-08-24 ENCOUNTER — Encounter (HOSPITAL_BASED_OUTPATIENT_CLINIC_OR_DEPARTMENT_OTHER): Payer: Self-pay | Admitting: Physical Therapy

## 2023-08-24 DIAGNOSIS — M25511 Pain in right shoulder: Secondary | ICD-10-CM | POA: Diagnosis not present

## 2023-08-24 DIAGNOSIS — M6281 Muscle weakness (generalized): Secondary | ICD-10-CM

## 2023-08-24 DIAGNOSIS — R29898 Other symptoms and signs involving the musculoskeletal system: Secondary | ICD-10-CM

## 2023-08-24 NOTE — Therapy (Signed)
OUTPATIENT PHYSICAL THERAPY SHOULDER TREATMENT        Patient Name: Misty Morris MRN: [id number] DOB:1951-03-11, 73 y.o., female Today's Date: 08/25/2023  END OF SESSION:  PT End of Session - 08/24/23 1404     Visit Number 28    Number of Visits 31    Date for PT Re-Evaluation 09/07/23    Authorization Type [employer]    PT Start Time 1320    PT Stop Time 1406    PT Time Calculation (min) 46 min    Activity Tolerance Patient tolerated treatment well    Behavior During Therapy WFL for tasks assessed/performed                         Past Medical History:  Diagnosis Date   Cancer (HCC)    hx of thyroid cancer   Fibromyalgia    Hypercholesterolemia    Hypertension    Hypothyroidism    thyroid removed   Polymyalgia rheumatica (HCC)    Rheumatoid arthritis (HCC)    "atypical/MD 08/2017" (02/14/2018)   Shingles    Stroke (HCC) 08/2017   "no residual" (02/14/2018)   TIA (transient ischemic attack) 02/14/2018   Type 2 diabetes, diet controlled (HCC)    Vision impairment    right eye   Past Surgical History:  Procedure Laterality Date   CATARACT EXTRACTION W/ INTRAOCULAR LENS  IMPLANT, BILATERAL     COLONOSCOPY WITH PROPOFOL N/A 10/12/2019   Procedure: COLONOSCOPY WITH PROPOFOL;  Surgeon: Jeani Hawking, MD;  Location: [facility];  Service: Endoscopy;  Laterality: N/A;   COLONOSCOPY WITH PROPOFOL N/A 12/24/2022   Procedure: COLONOSCOPY WITH PROPOFOL;  Surgeon: Jeani Hawking, MD;  Location: [facility];  Service: Gastroenterology;  Laterality: N/A;   HEMOSTASIS CLIP PLACEMENT  10/12/2019   Procedure: HEMOSTASIS CLIP PLACEMENT;  Surgeon: Jeani Hawking, MD;  Location: [facility];  Service: Endoscopy;;   JOINT REPLACEMENT     OPEN REDUCTION LE FORT I FRACTURE  1980s   PARTIAL KNEE ARTHROPLASTY Left 01/17/2017   Procedure: UNICOMPARTMENTAL LEFT KNEE;  Surgeon: Durene Romans, MD;  Location: [facility];  Service: Orthopedics;  Laterality: Left;    POLYPECTOMY  10/12/2019   Procedure: POLYPECTOMY;  Surgeon: Jeani Hawking, MD;  Location: [facility];  Service: Endoscopy;;   RADIOLOGY WITH ANESTHESIA N/A 08/11/2017   Procedure: MRI OF BRAIN WITH AND WITHOUT CONSTRAST, AND OF THE ORBIT WITH AND WITHOUT CONTRAST;  Surgeon: Radiologist, Medication, MD;  Location: [facility];  Service: Radiology;  Laterality: N/A;   RADIOLOGY WITH ANESTHESIA N/A 09/06/2017   Procedure: MRI OF BRAIN;  Surgeon: Radiologist, Medication, MD;  Location: [facility];  Service: Radiology;  Laterality: N/A;   RADIOLOGY WITH ANESTHESIA Right 03/03/2023   Procedure: MRI SHOULDER WITHOUT CONTRAST WITH ANESTHESIA;  Surgeon: Radiologist, Medication, MD;  Location: [facility];  Service: Radiology;  Laterality: Right;   RHINOPLASTY     SHOULDER ARTHROSCOPY WITH ROTATOR CUFF REPAIR Right 04/25/2023   Procedure: RIGHT SHOULDER ARTHROSCOPY WITH ROTATOR CUFF REPAIR, ACROMIOPLASTY;  Surgeon: Huel Cote, MD;  Location: [facility];  Service: Orthopedics;  Laterality: Right;   TOTAL KNEE ARTHROPLASTY Right 09/30/2015   Procedure: TOTAL RIGHT KNEE ARTHROPLASTY;  Surgeon: Durene Romans, MD;  Location: [facility];  Service: Orthopedics;  Laterality: Right;   VAGINAL HYSTERECTOMY     Patient Active Problem List   Diagnosis Date Noted   UTI (urinary tract infection) 10/07/2018   Sepsis (HCC) 10/07/2018   Hypokalemia 10/07/2018   S/P total  thyroidectomy 10/07/2018   Rheumatoid arthritis (HCC) 09/18/2018   Papillary thyroid carcinoma (HCC) 08/24/2018   TIA (transient ischemic attack) 02/14/2018   Hyperlipidemia    Ischemic optic neuropathy of right eye    Stroke (HCC) 09/04/2017   HTN (hypertension) 09/04/2017   DM (diabetes mellitus) (HCC) 09/04/2017   Polymyalgia rheumatica (HCC) 09/04/2017   Fibromyalgia 09/04/2017   S/P left UKR 01/17/2017   Overweight (BMI 25.0-29.9) 10/01/2015   S/P right TKA 09/30/2015    PCP: Irena Reichmann DO  REFERRING PROVIDER: Huel Cote, MD   REFERRING DIAG: [phone]  (ICD-10-CM) - Traumatic complete tear of right rotator cuff, initial encounter  THERAPY DIAG:  Right shoulder pain, unspecified chronicity  Muscle weakness (generalized)  Other symptoms and signs involving the musculoskeletal system  Rationale for Evaluation and Treatment: Rehabilitation  ONSET DATE: DOS 04/25/23  R RCR  SUBJECTIVE:                                                                                                                                                                                      SUBJECTIVE STATEMENT: Pt is 17 weeks and 2 days s/p R shoulder arthroscopic RCR,extensive debridement, and subacromial decompression. Pt denies any adverse effects after prior Rx.  Pt states she is hurting all over today which is probably due to the cold weather.   Pt reports slight difficulty with self care activities/ADLs.  Pt states she is able to drive well though has a twinge with certain movements including if her hand is on the top of the wheel.  Pt states she is able to perform her daily activities and household chores.  Pt is able to vacuum and uses a lightweight cordless vacuum.  Pt is able to reach into an overhead cabinet and put her dishes in the cabinet.  Pt has difficulty with scrubbing with cleaning.  Pt is limited with reaching behind back.  Pt able to carry groceries.  Pt has pain with reaching across body and behind back.  Pt is limited with repetitive activities.  Pt reports 90% improvement in sx's for quality of life.    Hand dominance: Right  PERTINENT HISTORY: Hx cancer, fibromyalgia, PMR, HTN, HLD, RA, Hx CVA/TIA, DM  PAIN:  1-2/10 current, 0/10 best, 3-4/10 worst R shoulder   PRECAUTIONS: Shoulder R RCR  RED FLAGS: None   WEIGHT BEARING RESTRICTIONS:  NWB RUE  FALLS:  Has patient fallen in last 6 months? No   PLOF: Independent  PATIENT GOALS:get this arm working again    OBJECTIVE:    TODAY'S TREATMENT:  Pt received R shoulder PROM in flexion, abd, ER, and IR per pt and tissue tolerance in supine.  R shoulder AROM/PROM: Flexion:  161 / 168 Sc[place]ion:  151 Abd:  150 /156 ER:  73 IR:  70  Functional IR AROM:   R:  L2 L:  T7  R shoulder strength: Flex:  4/5 Scaption:  4-/5 ER:  4-/5 IR:  4/5   Pt performed: Supine serratus punches 2# 3x10 Sidelying ER 2# 2x10 Standing jobe's flexion 2# 3x10 Standing scaption 1# 3x10 Standing rows with retraction with GTB 2x15 Standing shoulder extension with retraction with GTB 2x15 ER/IR with arm at side with RTB 2x10 each 4D ball rolls on wall x 10 each Standing functional AROM 2x10  FOTO:  Prior/Current: 57 / 62.  Goal of 61.    PATIENT EDUCATION:  Education details:  POC, exercise form, HEP, and precautions/protocol.  PT answered pt's questions.  Person educated: Patient Education method: Explanation, Demonstration, verbal and tactile cues, handout Education comprehension: verbalized understanding, returned demonstration, verbal and tactile cues required  HOME EXERCISE PROGRAM: Access Code: [email or web address] URL: [email or web address]      ASSESSMENT:  CLINICAL IMPRESSION: Pt is making great progress in PT in all areas.  Pt reports 90% improvement in sx's for quality of life.  She reports slight difficulty with self care activities/ADLs.  Pt states she is able to perform her daily activities and household chores though has difficulty with scrubbing with cleaning.  Pt is able to reach into an overhead cabinet and put her dishes in the cabinet.   Pt is limited with reaching behind back and has pain with reaching across body and behind back.  Pt has made excellent progress with shoulder AROM.  She is improving with strength t/o R shoulder and has expected weakness in R shoulder.  Pt is progressing with resisted exercises per  protocol and tolerating progression well. Pt demonstrates improved self perceived disability with FOTO score improving from 57 to 62.  Pt met her FOTO goal.  Pt has met all STG's and LTG's except LTG #4.  Pt may benefit from 1-2 more weeks of skilled PT to improve strength and establish independence with final HEP.    OBJECTIVE IMPAIRMENTS: decreased activity tolerance, decreased endurance, decreased mobility, decreased ROM, decreased strength, impaired flexibility, impaired UE functional use, improper body mechanics, postural dysfunction, and pain.   ACTIVITY LIMITATIONS: carrying, lifting, bending, bed mobility, bathing, dressing, self feeding, reach over head, hygiene/grooming, locomotion level, and caring for others  PARTICIPATION LIMITATIONS: meal prep, cleaning, laundry, driving, shopping, community activity, and yard work  PERSONAL FACTORS: 3+ comorbidities: Hx cancer, fibromyalgia, HTN, HLD, RA, Hx CVA/TIA, DM  are also affecting patient's functional outcome.   REHAB POTENTIAL: Good  CLINICAL DECISION MAKING: Stable/uncomplicated  EVALUATION COMPLEXITY: Low   GOALS: Goals reviewed with patient? Yes  SHORT TERM GOALS: Target date: 05/25/2023    Patient will be independent with HEP in order to improve functional outcomes. Baseline: Goal status: GOAL MET  10/24  2.  Patient will report at least 25% improvement in symptoms for improved quality of life. Baseline:  Goal status: GOAL MET   10/24  3.  Patient will tolerate beginning to wean from sling with MD approval for improved functional use of RUE.  Baseline:  Goal status: goal met    LONG TERM GOALS: Target date: 08/25/2023    Patient will report at least 75% improvement in symptoms for improved quality of life. Baseline:  Goal status  GOAL MET  1/15  2.  Patient will improve FOTO score by at least 32 points in order to indicate improved tolerance to activity. Baseline: 57 Goal status:  GOAL MET  08/24/23  3.   Patient will demonstrate at least 150 degrees in shoulder AROM in flexion for improved ability lift overhead. Baseline:  Goal status: goal met  12/12  4.  Patient will demonstrate grade of at least 4+/5 MMT grade in all tested musculature as evidence of improved strength to assist with lifting at home. Baseline:  Goal status: PROGRESSING   08/24/23 Target gate:  09/07/2023  5.  Patient will be progressing with resisted strengthening in order to build strength for chores and exercise.  Baseline:  Goal status: GOAL MET  08/24/23    PLAN:  PT FREQUENCY: 2x/wk  PT DURATION: 1-2 weeks  PLANNED INTERVENTIONS: Therapeutic exercises, Therapeutic activity, Neuromuscular re-education, Balance training, Gait training, Patient/Family education, Joint manipulation, Joint mobilization, Stair training, Orthotic/Fit training, DME instructions, Aquatic Therapy, Dry Needling, Electrical stimulation, Spinal manipulation, Spinal mobilization, Cryotherapy, Moist heat, Compression bandaging, scar mobilization, Splintting, Taping, Traction, Ultrasound, Ionotophoresis 4mg /ml Dexamethasone, and Manual therapy   PLAN FOR NEXT SESSION: Continue to progress per Dr. Serena Croissant RCR protocol.  Pt to be seen 1-2 more weeks to improve strength and establish independence with final HEP  Audie Clear III PT, DPT 08/25/23 9:26 AM

## 2023-08-29 DIAGNOSIS — E781 Pure hyperglyceridemia: Secondary | ICD-10-CM | POA: Diagnosis not present

## 2023-08-29 DIAGNOSIS — M810 Age-related osteoporosis without current pathological fracture: Secondary | ICD-10-CM | POA: Diagnosis not present

## 2023-08-29 DIAGNOSIS — Z79899 Other long term (current) drug therapy: Secondary | ICD-10-CM | POA: Diagnosis not present

## 2023-08-29 DIAGNOSIS — E1159 Type 2 diabetes mellitus with other circulatory complications: Secondary | ICD-10-CM | POA: Diagnosis not present

## 2023-08-30 ENCOUNTER — Ambulatory Visit (HOSPITAL_BASED_OUTPATIENT_CLINIC_OR_DEPARTMENT_OTHER): Payer: PPO

## 2023-08-30 ENCOUNTER — Ambulatory Visit (HOSPITAL_BASED_OUTPATIENT_CLINIC_OR_DEPARTMENT_OTHER): Payer: PPO | Admitting: Physical Therapy

## 2023-08-30 ENCOUNTER — Encounter (HOSPITAL_BASED_OUTPATIENT_CLINIC_OR_DEPARTMENT_OTHER): Payer: Self-pay

## 2023-08-30 DIAGNOSIS — M25511 Pain in right shoulder: Secondary | ICD-10-CM | POA: Diagnosis not present

## 2023-08-30 DIAGNOSIS — S46011A Strain of muscle(s) and tendon(s) of the rotator cuff of right shoulder, initial encounter: Secondary | ICD-10-CM

## 2023-08-30 DIAGNOSIS — M6281 Muscle weakness (generalized): Secondary | ICD-10-CM

## 2023-08-30 DIAGNOSIS — R29898 Other symptoms and signs involving the musculoskeletal system: Secondary | ICD-10-CM

## 2023-08-30 NOTE — Therapy (Signed)
OUTPATIENT PHYSICAL THERAPY SHOULDER TREATMENT        Patient Name: Misty Morris MRN: [id number] DOB:05-05-1951, 73 y.o., female Today's Date: 08/30/2023  END OF SESSION:  PT End of Session - 08/30/23 1318     Visit Number 29    Number of Visits 31    Date for PT Re-Evaluation 09/07/23    Authorization Type [employer]    Progress Note Due on Visit 30    PT Start Time 1310    PT Stop Time 1345    PT Time Calculation (min) 35 min    Activity Tolerance Patient tolerated treatment well    Behavior During Therapy WFL for tasks assessed/performed                          Past Medical History:  Diagnosis Date   Cancer (HCC)    hx of thyroid cancer   Fibromyalgia    Hypercholesterolemia    Hypertension    Hypothyroidism    thyroid removed   Polymyalgia rheumatica (HCC)    Rheumatoid arthritis (HCC)    "atypical/MD 08/2017" (02/14/2018)   Shingles    Stroke (HCC) 08/2017   "no residual" (02/14/2018)   TIA (transient ischemic attack) 02/14/2018   Type 2 diabetes, diet controlled (HCC)    Vision impairment    right eye   Past Surgical History:  Procedure Laterality Date   CATARACT EXTRACTION W/ INTRAOCULAR LENS  IMPLANT, BILATERAL     COLONOSCOPY WITH PROPOFOL N/A 10/12/2019   Procedure: COLONOSCOPY WITH PROPOFOL;  Surgeon: Jeani Hawking, MD;  Location: [facility];  Service: Endoscopy;  Laterality: N/A;   COLONOSCOPY WITH PROPOFOL N/A 12/24/2022   Procedure: COLONOSCOPY WITH PROPOFOL;  Surgeon: Jeani Hawking, MD;  Location: [facility];  Service: Gastroenterology;  Laterality: N/A;   HEMOSTASIS CLIP PLACEMENT  10/12/2019   Procedure: HEMOSTASIS CLIP PLACEMENT;  Surgeon: Jeani Hawking, MD;  Location: [facility];  Service: Endoscopy;;   JOINT REPLACEMENT     OPEN REDUCTION LE FORT I FRACTURE  1980s   PARTIAL KNEE ARTHROPLASTY Left 01/17/2017   Procedure: UNICOMPARTMENTAL LEFT KNEE;  Surgeon: Durene Romans, MD;  Location: [facility];  Service:  Orthopedics;  Laterality: Left;   POLYPECTOMY  10/12/2019   Procedure: POLYPECTOMY;  Surgeon: Jeani Hawking, MD;  Location: [facility];  Service: Endoscopy;;   RADIOLOGY WITH ANESTHESIA N/A 08/11/2017   Procedure: MRI OF BRAIN WITH AND WITHOUT CONSTRAST, AND OF THE ORBIT WITH AND WITHOUT CONTRAST;  Surgeon: Radiologist, Medication, MD;  Location: [facility];  Service: Radiology;  Laterality: N/A;   RADIOLOGY WITH ANESTHESIA N/A 09/06/2017   Procedure: MRI OF BRAIN;  Surgeon: Radiologist, Medication, MD;  Location: [facility];  Service: Radiology;  Laterality: N/A;   RADIOLOGY WITH ANESTHESIA Right 03/03/2023   Procedure: MRI SHOULDER WITHOUT CONTRAST WITH ANESTHESIA;  Surgeon: Radiologist, Medication, MD;  Location: [facility];  Service: Radiology;  Laterality: Right;   RHINOPLASTY     SHOULDER ARTHROSCOPY WITH ROTATOR CUFF REPAIR Right 04/25/2023   Procedure: RIGHT SHOULDER ARTHROSCOPY WITH ROTATOR CUFF REPAIR, ACROMIOPLASTY;  Surgeon: Huel Cote, MD;  Location: [facility];  Service: Orthopedics;  Laterality: Right;   TOTAL KNEE ARTHROPLASTY Right 09/30/2015   Procedure: TOTAL RIGHT KNEE ARTHROPLASTY;  Surgeon: Durene Romans, MD;  Location: [facility];  Service: Orthopedics;  Laterality: Right;   VAGINAL HYSTERECTOMY     Patient Active Problem List   Diagnosis Date Noted   UTI (urinary tract infection) 10/07/2018   Sepsis (  HCC) 10/07/2018   Hypokalemia 10/07/2018   S/P total thyroidectomy 10/07/2018   Rheumatoid arthritis (HCC) 09/18/2018   Papillary thyroid carcinoma (HCC) 08/24/2018   TIA (transient ischemic attack) 02/14/2018   Hyperlipidemia    Ischemic optic neuropathy of right eye    Stroke (HCC) 09/04/2017   HTN (hypertension) 09/04/2017   DM (diabetes mellitus) (HCC) 09/04/2017   Polymyalgia rheumatica (HCC) 09/04/2017   Fibromyalgia 09/04/2017   S/P left UKR 01/17/2017   Overweight (BMI 25.0-29.9) 10/01/2015   S/P right TKA 09/30/2015    PCP: Irena Reichmann DO  REFERRING PROVIDER: Huel Cote, MD   REFERRING DIAG: [phone] (ICD-10-CM) - Traumatic complete tear of right rotator cuff, initial encounter  THERAPY DIAG:  Right shoulder pain, unspecified chronicity  Muscle weakness (generalized)  Other symptoms and signs involving the musculoskeletal system  Traumatic complete tear of right rotator cuff, initial encounter  Rationale for Evaluation and Treatment: Rehabilitation  ONSET DATE: DOS 04/25/23  R RCR  SUBJECTIVE:                                                                                                                                                                                      SUBJECTIVE STATEMENT: No pain at entry. Arrives thinking her appt was at 1pm but was earlier today. Able to work her in.   Hand dominance: Right  PERTINENT HISTORY: Hx cancer, fibromyalgia, PMR, HTN, HLD, RA, Hx CVA/TIA, DM  PAIN:  1-2/10 current, 0/10 best, 3-4/10 worst R shoulder   PRECAUTIONS: Shoulder R RCR  RED FLAGS: None   WEIGHT BEARING RESTRICTIONS:  NWB RUE  FALLS:  Has patient fallen in last 6 months? No   PLOF: Independent  PATIENT GOALS:get this arm working again    OBJECTIVE:    TODAY'S TREATMENT:                                                                                                                                          PROM all planes  Pt performed:  Supine flexion 2# 3x10 Standing rows  with retraction with GTB 2x15 Standing shoulder extension with retraction with GTB 2x15 Dumbbell reach to 2nd shelf in cabinet-, 3# 2x10 Standing press out 3# 3x10 Standing active flexion 2# 2x10 Standing active abduction 2# 2x10 4D ball rolls x20ea OH dumbbell reach to 2nd cabinet shelf 4# 2x10     PATIENT EDUCATION:  Education details:  POC, exercise form, HEP, and precautions/protocol.  PT answered pt's questions.  Person educated: Patient Education method: Explanation, Demonstration, verbal and tactile cues,  handout Education comprehension: verbalized understanding, returned demonstration, verbal and tactile cues required  HOME EXERCISE PROGRAM: Access Code: [email or web address] URL: [email or web address]      ASSESSMENT:  CLINICAL IMPRESSION: Excellent performance with strengthening tasks. She reported her shoulder felt "great" during all exercises. Did fatigue with 4D ball rolls. Able to use 4# weight with shelf reaches. Tx time limited today due to arrival time.   Pt may benefit from 1-2 more weeks of skilled PT to improve strength and establish independence with final HEP.    OBJECTIVE IMPAIRMENTS: decreased activity tolerance, decreased endurance, decreased mobility, decreased ROM, decreased strength, impaired flexibility, impaired UE functional use, improper body mechanics, postural dysfunction, and pain.   ACTIVITY LIMITATIONS: carrying, lifting, bending, bed mobility, bathing, dressing, self feeding, reach over head, hygiene/grooming, locomotion level, and caring for others  PARTICIPATION LIMITATIONS: meal prep, cleaning, laundry, driving, shopping, community activity, and yard work  PERSONAL FACTORS: 3+ comorbidities: Hx cancer, fibromyalgia, HTN, HLD, RA, Hx CVA/TIA, DM  are also affecting patient's functional outcome.   REHAB POTENTIAL: Good  CLINICAL DECISION MAKING: Stable/uncomplicated  EVALUATION COMPLEXITY: Low   GOALS: Goals reviewed with patient? Yes  SHORT TERM GOALS: Target date: 05/25/2023    Patient will be independent with HEP in order to improve functional outcomes. Baseline: Goal status: GOAL MET  10/24  2.  Patient will report at least 25% improvement in symptoms for improved quality of life. Baseline:  Goal status: GOAL MET   10/24  3.  Patient will tolerate beginning to wean from sling with MD approval for improved functional use of RUE.  Baseline:  Goal status: goal met    LONG TERM GOALS: Target date: 08/25/2023    Patient will report  at least 75% improvement in symptoms for improved quality of life. Baseline:  Goal status  GOAL MET 1/15  2.  Patient will improve FOTO score by at least 32 points in order to indicate improved tolerance to activity. Baseline: 57 Goal status:  GOAL MET  08/24/23  3.  Patient will demonstrate at least 150 degrees in shoulder AROM in flexion for improved ability lift overhead. Baseline:  Goal status: goal met  12/12  4.  Patient will demonstrate grade of at least 4+/5 MMT grade in all tested musculature as evidence of improved strength to assist with lifting at home. Baseline:  Goal status: PROGRESSING   08/24/23 Target gate:  09/07/2023  5.  Patient will be progressing with resisted strengthening in order to build strength for chores and exercise.  Baseline:  Goal status: GOAL MET  08/24/23    PLAN:  PT FREQUENCY: 2x/wk  PT DURATION: 1-2 weeks  PLANNED INTERVENTIONS: Therapeutic exercises, Therapeutic activity, Neuromuscular re-education, Balance training, Gait training, Patient/Family education, Joint manipulation, Joint mobilization, Stair training, Orthotic/Fit training, DME instructions, Aquatic Therapy, Dry Needling, Electrical stimulation, Spinal manipulation, Spinal mobilization, Cryotherapy, Moist heat, Compression bandaging, scar mobilization, Splintting, Taping, Traction, Ultrasound, Ionotophoresis 4mg /ml Dexamethasone, and Manual therapy   PLAN FOR NEXT SESSION: Continue to progress per Dr. Serena Croissant  RCR protocol.  Pt to be seen 1-2 more weeks to improve strength and establish independence with final HEP  Riki Altes, PTA  08/30/23 2:23 PM

## 2023-09-01 ENCOUNTER — Ambulatory Visit (HOSPITAL_BASED_OUTPATIENT_CLINIC_OR_DEPARTMENT_OTHER): Payer: PPO | Admitting: Physical Therapy

## 2023-09-05 DIAGNOSIS — Z85850 Personal history of malignant neoplasm of thyroid: Secondary | ICD-10-CM | POA: Diagnosis not present

## 2023-09-05 DIAGNOSIS — E781 Pure hyperglyceridemia: Secondary | ICD-10-CM | POA: Diagnosis not present

## 2023-09-05 DIAGNOSIS — H3090 Unspecified chorioretinal inflammation, unspecified eye: Secondary | ICD-10-CM | POA: Diagnosis not present

## 2023-09-05 DIAGNOSIS — M0579 Rheumatoid arthritis with rheumatoid factor of multiple sites without organ or systems involvement: Secondary | ICD-10-CM | POA: Diagnosis not present

## 2023-09-05 DIAGNOSIS — Z0000 Encounter for general adult medical examination without abnormal findings: Secondary | ICD-10-CM | POA: Diagnosis not present

## 2023-09-05 DIAGNOSIS — M353 Polymyalgia rheumatica: Secondary | ICD-10-CM | POA: Diagnosis not present

## 2023-09-05 DIAGNOSIS — L409 Psoriasis, unspecified: Secondary | ICD-10-CM | POA: Diagnosis not present

## 2023-09-05 DIAGNOSIS — E559 Vitamin D deficiency, unspecified: Secondary | ICD-10-CM | POA: Diagnosis not present

## 2023-09-05 DIAGNOSIS — E1159 Type 2 diabetes mellitus with other circulatory complications: Secondary | ICD-10-CM | POA: Diagnosis not present

## 2023-09-05 DIAGNOSIS — N39498 Other specified urinary incontinence: Secondary | ICD-10-CM | POA: Diagnosis not present

## 2023-09-06 ENCOUNTER — Ambulatory Visit (HOSPITAL_BASED_OUTPATIENT_CLINIC_OR_DEPARTMENT_OTHER): Payer: PPO | Admitting: Physical Therapy

## 2023-09-06 DIAGNOSIS — R29898 Other symptoms and signs involving the musculoskeletal system: Secondary | ICD-10-CM

## 2023-09-06 DIAGNOSIS — M25511 Pain in right shoulder: Secondary | ICD-10-CM

## 2023-09-06 DIAGNOSIS — M6281 Muscle weakness (generalized): Secondary | ICD-10-CM

## 2023-09-06 NOTE — Therapy (Signed)
OUTPATIENT PHYSICAL THERAPY SHOULDER TREATMENT        Patient Name: Misty Morris MRN: [id number] DOB:[birth year], 73 y.o., female Today's Date: 09/07/2023  END OF SESSION:  PT End of Session - 09/06/23 1240     Visit Number 30    Number of Visits 30    Date for PT Re-Evaluation 09/07/23    Authorization Type [employer]    PT Start Time 1150    PT Stop Time 1239    PT Time Calculation (min) 49 min    Activity Tolerance Patient tolerated treatment well    Behavior During Therapy WFL for tasks assessed/performed                           Past Medical History:  Diagnosis Date   Cancer (HCC)    hx of thyroid cancer   Fibromyalgia    Hypercholesterolemia    Hypertension    Hypothyroidism    thyroid removed   Polymyalgia rheumatica (HCC)    Rheumatoid arthritis (HCC)    "atypical/MD 08/2017" (02/14/2018)   Shingles    Stroke (HCC) 08/2017   "no residual" (02/14/2018)   TIA (transient ischemic attack) 02/14/2018   Type 2 diabetes, diet controlled (HCC)    Vision impairment    right eye   Past Surgical History:  Procedure Laterality Date   CATARACT EXTRACTION W/ INTRAOCULAR LENS  IMPLANT, BILATERAL     COLONOSCOPY WITH PROPOFOL N/A 10/12/2019   Procedure: COLONOSCOPY WITH PROPOFOL;  Surgeon: Jeani Hawking, MD;  Location: [facility];  Service: Endoscopy;  Laterality: N/A;   COLONOSCOPY WITH PROPOFOL N/A 12/24/2022   Procedure: COLONOSCOPY WITH PROPOFOL;  Surgeon: Jeani Hawking, MD;  Location: [facility];  Service: Gastroenterology;  Laterality: N/A;   HEMOSTASIS CLIP PLACEMENT  10/12/2019   Procedure: HEMOSTASIS CLIP PLACEMENT;  Surgeon: Jeani Hawking, MD;  Location: [facility];  Service: Endoscopy;;   JOINT REPLACEMENT     OPEN REDUCTION LE FORT I FRACTURE  1980s   PARTIAL KNEE ARTHROPLASTY Left 01/17/2017   Procedure: UNICOMPARTMENTAL LEFT KNEE;  Surgeon: Durene Romans, MD;  Location: [facility];  Service: Orthopedics;  Laterality: Left;    POLYPECTOMY  10/12/2019   Procedure: POLYPECTOMY;  Surgeon: Jeani Hawking, MD;  Location: [facility];  Service: Endoscopy;;   RADIOLOGY WITH ANESTHESIA N/A 08/11/2017   Procedure: MRI OF BRAIN WITH AND WITHOUT CONSTRAST, AND OF THE ORBIT WITH AND WITHOUT CONTRAST;  Surgeon: Radiologist, Medication, MD;  Location: [facility];  Service: Radiology;  Laterality: N/A;   RADIOLOGY WITH ANESTHESIA N/A 09/06/2017   Procedure: MRI OF BRAIN;  Surgeon: Radiologist, Medication, MD;  Location: [facility];  Service: Radiology;  Laterality: N/A;   RADIOLOGY WITH ANESTHESIA Right 03/03/2023   Procedure: MRI SHOULDER WITHOUT CONTRAST WITH ANESTHESIA;  Surgeon: Radiologist, Medication, MD;  Location: [facility];  Service: Radiology;  Laterality: Right;   RHINOPLASTY     SHOULDER ARTHROSCOPY WITH ROTATOR CUFF REPAIR Right 04/25/2023   Procedure: RIGHT SHOULDER ARTHROSCOPY WITH ROTATOR CUFF REPAIR, ACROMIOPLASTY;  Surgeon: Huel Cote, MD;  Location: [facility];  Service: Orthopedics;  Laterality: Right;   TOTAL KNEE ARTHROPLASTY Right 09/30/2015   Procedure: TOTAL RIGHT KNEE ARTHROPLASTY;  Surgeon: Durene Romans, MD;  Location: [facility];  Service: Orthopedics;  Laterality: Right;   VAGINAL HYSTERECTOMY     Patient Active Problem List   Diagnosis Date Noted   UTI (urinary tract infection) 10/07/2018   Sepsis (HCC) 10/07/2018   Hypokalemia 10/07/2018  S/P total thyroidectomy 10/07/2018   Rheumatoid arthritis (HCC) 09/18/2018   Papillary thyroid carcinoma (HCC) 08/24/2018   TIA (transient ischemic attack) 02/14/2018   Hyperlipidemia    Ischemic optic neuropathy of right eye    Stroke (HCC) 09/04/2017   HTN (hypertension) 09/04/2017   DM (diabetes mellitus) (HCC) 09/04/2017   Polymyalgia rheumatica (HCC) 09/04/2017   Fibromyalgia 09/04/2017   S/P left UKR 01/17/2017   Overweight (BMI 25.0-29.9) 10/01/2015   S/P right TKA 09/30/2015    PCP: Irena Reichmann DO  REFERRING PROVIDER: Huel Cote, MD   REFERRING DIAG: [phone]  (ICD-10-CM) - Traumatic complete tear of right rotator cuff, initial encounter  THERAPY DIAG:  Right shoulder pain, unspecified chronicity  Muscle weakness (generalized)  Other symptoms and signs involving the musculoskeletal system  Rationale for Evaluation and Treatment: Rehabilitation  ONSET DATE: DOS 04/25/23  R RCR  SUBJECTIVE:                                                                                                                                                                                      SUBJECTIVE STATEMENT: Pt report just having minimal difficulty with ADLs/IADLs.  "I'm pretty close to normal."  Pt states she feels like she is doing pretty much everything normal.  Pt states she does limit herself with lifting.  Pt has much improved pain and only has certain movements that can cause any pain.    Hand dominance: Right  PERTINENT HISTORY: Hx cancer, fibromyalgia, PMR, HTN, HLD, RA, Hx CVA/TIA, DM  PAIN:  "Not feeling it right now.  Almost nothing."   PRECAUTIONS: Shoulder R RCR  RED FLAGS: None   WEIGHT BEARING RESTRICTIONS:  NWB RUE  FALLS:  Has patient fallen in last 6 months? No   PLOF: Independent  PATIENT GOALS:get this arm working again    OBJECTIVE:    TODAY'S TREATMENT:  R shoulder strength: Flexion, scaption, and ER: 4/5 MMT  PT thoroughly reviewed HEP and educated pt concerning correct form, appropriate resistance, and appropriate frequency.  PT updated HEP and gave pt a HEP handout.  PT educated pt in correct form and appropriate frequency.    Standing jobe's flexion 2# 3x10 Standing scaption 2# 3x10 Standing rows with retraction with GTB 2x15 Standing shoulder extension with retraction with GTB 2x15 ER with arm at side 2x15 with RTB IR with arm at side x15 with RTB and x15 with GTB 4D ball  rolls 2x10 each  PT didn't perform FOTO due to pt completing it on 1/15 and reaching goal.      PATIENT EDUCATION:  Education details:  POC, exercise form, HEP, and precautions/protocol.  PT answered pt's questions.  Person educated: Patient Education method: Explanation, Demonstration, verbal and tactile cues, handout Education comprehension: verbalized understanding, returned demonstration, verbal and tactile cues required  HOME EXERCISE PROGRAM: Access Code: [email or web address] URL: [email or web address]  Updated HEP: - Shoulder extension with resistance - Neutral  - 1 x daily - 3 x weekly - 2-3 sets - 10 reps - Standing Wall [employer] with Mini Swiss Ball  - 1 x daily - 3 x weekly - 2 sets - 10 reps      ASSESSMENT:  CLINICAL IMPRESSION: Pt has made excellent progress in PT.  She has good AROM t/o R shoulder.  Pt has progressed well with strength t/o R shoulder and has 4/5 MMT t/o shoulder.  Pt reports having minimal difficulty with ADLs/IADLs and states she feels like she is doing pretty much everything normal.  PT thoroughly reviewed HEP and educated pt concerning correct form, appropriate resistance, and appropriate frequency.  PT thoroughly reviewed HEP and updated HEP.  Pt demonstrates good understanding of HEP and is independent with HEP.  Pt has met all goals except LTG #4.  She made progress with strength though did not meet LTG #4.  Pt is ready for discharge.     OBJECTIVE IMPAIRMENTS: decreased activity tolerance, decreased endurance, decreased mobility, decreased ROM, decreased strength, impaired flexibility, impaired UE functional use, improper body mechanics, postural dysfunction, and pain.   ACTIVITY LIMITATIONS: carrying, lifting, bending, bed mobility, bathing, dressing, self feeding, reach over head, hygiene/grooming, locomotion level, and caring for others  PARTICIPATION LIMITATIONS: meal prep, cleaning, laundry, driving, shopping, community activity, and  yard work  PERSONAL FACTORS: 3+ comorbidities: Hx cancer, fibromyalgia, HTN, HLD, RA, Hx CVA/TIA, DM  are also affecting patient's functional outcome.   REHAB POTENTIAL: Good  CLINICAL DECISION MAKING: Stable/uncomplicated  EVALUATION COMPLEXITY: Low   GOALS: Goals reviewed with patient? Yes  SHORT TERM GOALS: Target date: 05/25/2023    Patient will be independent with HEP in order to improve functional outcomes. Baseline: Goal status: GOAL MET  10/24  2.  Patient will report at least 25% improvement in symptoms for improved quality of life. Baseline:  Goal status: GOAL MET   10/24  3.  Patient will tolerate beginning to wean from sling with MD approval for improved functional use of RUE.  Baseline:  Goal status: goal met    LONG TERM GOALS: Target date: 08/25/2023    Patient will report at least 75% improvement in symptoms for improved quality of life. Baseline:  Goal status  GOAL MET 1/15  2.  Patient will improve FOTO score by at least 32 points in order to indicate improved tolerance to activity. Baseline: 57 Goal status:  GOAL MET  08/24/23  3.  Patient will demonstrate at least 150 degrees in shoulder AROM in flexion for improved ability lift overhead. Baseline:  Goal status: goal met  12/12  4.  Patient will demonstrate grade of at least 4+/5 MMT grade in all tested musculature as evidence of improved strength to assist with lifting at home. Baseline:  Goal status: IMPROVED BUT NOT MET  09/06/23 Target gate:  09/07/2023  5.  Patient will be progressing with resisted strengthening in order to build strength for chores and exercise.  Baseline:  Goal status: GOAL MET  08/24/23    PLAN:   PLANNED INTERVENTIONS: Therapeutic exercises, Therapeutic activity, Neuromuscular re-education, Balance training, Gait training, Patient/Family education, Joint manipulation, Joint mobilization, Stair training, Orthotic/Fit training, DME instructions, Aquatic Therapy, Dry  Needling, Electrical stimulation, Spinal manipulation, Spinal mobilization, Cryotherapy, Moist heat, Compression bandaging, scar mobilization, Splintting, Taping, Traction, Ultrasound, Ionotophoresis 4mg /ml Dexamethasone, and Manual therapy   PLAN FOR NEXT SESSION:  Pt to be discharged from skilled PT due to meeting the majority of her goals and being pleased with her functional level.  She is agreeable with discharge and will cont with HEP.   PHYSICAL THERAPY DISCHARGE SUMMARY  Visits from Start of Care: 30  Current functional level related to goals / functional outcomes: See above   Remaining deficits: See above   Education / Equipment: HEP    Audie Clear III PT, DPT 09/07/23 9:53 AM

## 2023-09-07 ENCOUNTER — Encounter (HOSPITAL_BASED_OUTPATIENT_CLINIC_OR_DEPARTMENT_OTHER): Payer: Self-pay | Admitting: Physical Therapy

## 2023-09-08 ENCOUNTER — Encounter (HOSPITAL_BASED_OUTPATIENT_CLINIC_OR_DEPARTMENT_OTHER): Payer: PPO | Admitting: Physical Therapy

## 2023-09-12 DIAGNOSIS — H401131 Primary open-angle glaucoma, bilateral, mild stage: Secondary | ICD-10-CM | POA: Diagnosis not present

## 2023-09-12 DIAGNOSIS — H3552 Pigmentary retinal dystrophy: Secondary | ICD-10-CM | POA: Diagnosis not present

## 2023-12-19 ENCOUNTER — Ambulatory Visit (HOSPITAL_COMMUNITY)
Admission: EM | Admit: 2023-12-19 | Discharge: 2023-12-19 | Disposition: A | Discharge status: Home / Self Care | Attending: Family Medicine | Admitting: Family Medicine

## 2023-12-19 ENCOUNTER — Encounter (HOSPITAL_COMMUNITY): Payer: Self-pay | Admitting: Emergency Medicine

## 2023-12-19 DIAGNOSIS — R509 Fever, unspecified: Secondary | ICD-10-CM

## 2023-12-19 DIAGNOSIS — R3915 Urgency of urination: Secondary | ICD-10-CM | POA: Diagnosis not present

## 2023-12-19 LAB — POCT URINALYSIS DIP (MANUAL ENTRY)
Bilirubin, UA: NEGATIVE
Blood, UA: NEGATIVE
Glucose, UA: NEGATIVE mg/dL
Nitrite, UA: NEGATIVE
Protein Ur, POC: 30 mg/dL — AB
Spec Grav, UA: 1.015 (ref 1.010–1.025)
Urobilinogen, UA: 0.2 U/dL
pH, UA: 6.5 (ref 5.0–8.0)

## 2023-12-19 LAB — POC COVID19/FLU A&B COMBO
Covid Antigen, POC: NEGATIVE
Influenza A Antigen, POC: NEGATIVE
Influenza B Antigen, POC: NEGATIVE

## 2023-12-19 MED ORDER — CEPHALEXIN 500 MG PO CAPS: 500.0000 mg | ORAL_CAPSULE | Freq: Three times a day (TID) | ORAL | 0 refills | Status: AC

## 2023-12-19 NOTE — ED Triage Notes (Signed)
 Pt woke up at 4am with fever 100.7, fatigue, and body aches. Thinks might have UTI, denies urinary symptoms at this time. Didn't take anything for her symptoms.

## 2023-12-19 NOTE — Discharge Instructions (Signed)
 You were seen today for fever and body aches since this morning.  Your flu/covid swab was negative.  Your urine does not show obvious infection.  However, given your history I have sent out an antibiotic for possible UTI, and will culture the urine.  If the culture  is normal, you may be called to stop the antibiotic.  Please get rest and fluids.  Return if not improving.

## 2023-12-19 NOTE — ED Provider Notes (Addendum)
 [facility]    CSN: [id number] Arrival date & time: 12/19/23  4098      History   Chief Complaint Chief Complaint  Patient presents with   Fever   Generalized Body Aches    HPI Misty Morris is a 73 y.o. female.    Fever Associated symptoms: myalgias    Patient is here today as she woke up with fever and body aches. 100.7 this morning.  No runny nose, drainage, congestion.  No cough.  She thinks it may be a UTI. She has a h/o UTI's.  She states she does not usually have urinary symptoms, but she usually just gets a fever with them.  She is having some slight urgency No n/v.         Past Medical History:  Diagnosis Date   Cancer (HCC)    hx of thyroid  cancer   Fibromyalgia    Hypercholesterolemia    Hypertension    Hypothyroidism    thyroid  removed   Polymyalgia rheumatica (HCC)    Rheumatoid arthritis (HCC)    "atypical/MD 08/2017" (02/14/2018)   Shingles    Stroke (HCC) 08/2017   "no residual" (02/14/2018)   TIA (transient ischemic attack) 02/14/2018   Type 2 diabetes, diet controlled (HCC)    Vision impairment    right eye    Patient Active Problem List   Diagnosis Date Noted   UTI (urinary tract infection) 10/07/2018   Sepsis (HCC) 10/07/2018   Hypokalemia 10/07/2018   S/P total thyroidectomy 10/07/2018   Rheumatoid arthritis (HCC) 09/18/2018   Papillary thyroid  carcinoma (HCC) 08/24/2018   TIA (transient ischemic attack) 02/14/2018   Hyperlipidemia    Ischemic optic neuropathy of right eye    Stroke (HCC) 09/04/2017   HTN (hypertension) 09/04/2017   DM (diabetes mellitus) (HCC) 09/04/2017   Polymyalgia rheumatica (HCC) 09/04/2017   Fibromyalgia 09/04/2017   S/P left UKR 01/17/2017   Overweight (BMI 25.0-29.9) 10/01/2015   S/P right TKA 09/30/2015    Past Surgical History:  Procedure Laterality Date   CATARACT EXTRACTION W/ INTRAOCULAR LENS  IMPLANT, BILATERAL     COLONOSCOPY WITH PROPOFOL  N/A 10/12/2019   Procedure:  COLONOSCOPY WITH PROPOFOL ;  Surgeon: Alvis Jourdain, MD;  Location: [facility];  Service: Endoscopy;  Laterality: N/A;   COLONOSCOPY WITH PROPOFOL  N/A 12/24/2022   Procedure: COLONOSCOPY WITH PROPOFOL ;  Surgeon: Alvis Jourdain, MD;  Location: [facility];  Service: Gastroenterology;  Laterality: N/A;   HEMOSTASIS CLIP PLACEMENT  10/12/2019   Procedure: HEMOSTASIS CLIP PLACEMENT;  Surgeon: Alvis Jourdain, MD;  Location: [facility];  Service: Endoscopy;;   JOINT REPLACEMENT     OPEN REDUCTION LE FORT I FRACTURE  1980s   PARTIAL KNEE ARTHROPLASTY Left 01/17/2017   Procedure: UNICOMPARTMENTAL LEFT KNEE;  Surgeon: Claiborne Crew, MD;  Location: [facility];  Service: Orthopedics;  Laterality: Left;   POLYPECTOMY  10/12/2019   Procedure: POLYPECTOMY;  Surgeon: Alvis Jourdain, MD;  Location: [facility];  Service: Endoscopy;;   RADIOLOGY WITH ANESTHESIA N/A 08/11/2017   Procedure: MRI OF BRAIN WITH AND WITHOUT CONSTRAST, AND OF THE ORBIT WITH AND WITHOUT CONTRAST;  Surgeon: Radiologist, Medication, MD;  Location: [facility];  Service: Radiology;  Laterality: N/A;   RADIOLOGY WITH ANESTHESIA N/A 09/06/2017   Procedure: MRI OF BRAIN;  Surgeon: Radiologist, Medication, MD;  Location: [facility];  Service: Radiology;  Laterality: N/A;   RADIOLOGY WITH ANESTHESIA Right 03/03/2023   Procedure: MRI SHOULDER WITHOUT CONTRAST WITH ANESTHESIA;  Surgeon: Radiologist, Medication, MD;  Location:  [facility];  Service: Radiology;  Laterality: Right;   RHINOPLASTY     SHOULDER ARTHROSCOPY WITH ROTATOR CUFF REPAIR Right 04/25/2023   Procedure: RIGHT SHOULDER ARTHROSCOPY WITH ROTATOR CUFF REPAIR, ACROMIOPLASTY;  Surgeon: Wilhelmenia Harada, MD;  Location: [facility];  Service: Orthopedics;  Laterality: Right;   TOTAL KNEE ARTHROPLASTY Right 09/30/2015   Procedure: TOTAL RIGHT KNEE ARTHROPLASTY;  Surgeon: Claiborne Crew, MD;  Location: [facility];  Service: Orthopedics;  Laterality: Right;   VAGINAL HYSTERECTOMY      OB History   No obstetric history on  file.      Home Medications    Prior to Admission medications   Medication Sig Start Date End Date Taking? Authorizing Provider  ACETYLCARN-ALPHA LIPOIC ACID  PO Take 1 capsule by mouth 2 (two) times daily.    [provider]  amLODipine  (NORVASC ) 5 MG tablet Take 5 mg by mouth every evening.  08/20/15   [provider]  Calcium -Magnesium -Vitamin D (CALCIUM  1200+D3 PO) Take 1 tablet by mouth every evening.    [provider]  Cholecalciferol (VITAMIN D) 50 MCG (2000 UT) tablet Take 2,000 Units by mouth daily.    [provider]  Chromium  Picolinate 200 MCG CAPS Take 200 mcg by mouth daily.    [provider]  clopidogrel  (PLAVIX ) 75 MG tablet Take 1 tablet (75 mg total) by mouth daily. 02/16/18   Krishnan, Gokul, MD  Coenzyme Q10 (CO Q-10) 300 MG CAPS Take 300 mg by mouth every evening.    [provider]  diphenhydrAMINE  (BENADRYL ) 25 MG tablet Take 25 mg by mouth at bedtime.    [provider]  estradiol (ESTRACE) 0.1 MG/GM vaginal cream Place 1 Applicatorful vaginally 2 (two) times a week.    [provider]  HYDROmorphone  (DILAUDID ) 2 MG tablet Take 0.5 tablets (1 mg total) by mouth every 4 (four) hours as needed for severe pain. 03/09/23   Wilhelmenia Harada, MD  ibuprofen  (ADVIL ) 200 MG tablet Take 400 mg by mouth every 6 (six) hours as needed for moderate pain or headache (about 4 or 5 time a week).    [provider]  levothyroxine  (SYNTHROID ) 88 MCG tablet Take 88 mcg by mouth daily before breakfast.    [provider]  Lysine  600 MG TABS Take 600 mg by mouth every evening.    [provider]  moxifloxacin (VIGAMOX) 0.5 % ophthalmic solution Place 1 drop into the left eye daily.    [provider]  Omega-3 Fatty Acids (FISH OIL PO) Take 1,400 mg by mouth daily.    [provider]  oxyCODONE  (ROXICODONE ) 5 MG immediate release tablet Take 1 tablet (5 mg total) by mouth every 4  (four) hours as needed for severe pain or breakthrough pain. 04/25/23   Wilhelmenia Harada, MD  Polyvinyl Alcohol-Povidone (REFRESH OP) Place 1 drop into the left eye See admin instructions. Middle of the night 0300 as needed    [provider]  predniSONE  (DELTASONE ) 1 MG tablet Take 4 mg by mouth every evening.    [provider]  rosuvastatin  (CRESTOR ) 20 MG tablet Take 1 tablet (20 mg total) by mouth daily at 6 PM. 02/15/18   Maylene Spear, MD  timolol  (TIMOPTIC ) 0.25 % ophthalmic solution Place 1 drop into both eyes daily.    [provider]  ZIOPTAN 0.0015 % SOLN Place 1 drop into both eyes at bedtime. 07/23/15   [provider]  zoledronic  acid (RECLAST ) 5 MG/100ML SOLN injection Inject 5  mg into the vein once. 02/22/23 Yearly    [provider]    Family History Family History  Problem Relation Age of Onset   Hypertension Mother    Seizures Mother    Hypertension Father    Hypertension Sister    Pancreatic cancer Other     Social History Social History   Tobacco Use   Smoking status: Former    Current packs/day: 0.00    Average packs/day: 1 pack/day for 25.0 years (25.0 ttl pk-yrs)    Types: Cigarettes    Start date: 12/27/1986    Quit date: 12/27/2011    Years since quitting: 11.9   Smokeless tobacco: Never  Vaping Use   Vaping status: Never Used  Substance Use Topics   Alcohol use: Never   Drug use: Yes    Types: Marijuana    Comment: last smoked 03/28/23     Allergies   Codeine, Demerol  [meperidine ], Latex, Morphine and codeine, Sulfa antibiotics, Tramadol, Vicodin [hydrocodone -acetaminophen ], and Adalimumab   Review of Systems Review of Systems  Constitutional:  Positive for fever.  HENT: Negative.    Respiratory: Negative.    Cardiovascular: Negative.   Gastrointestinal: Negative.   Musculoskeletal:  Positive for arthralgias and myalgias.  Psychiatric/Behavioral: Negative.       Physical Exam Triage Vital  Signs ED Triage Vitals  Encounter Vitals Group     BP 12/19/23 0834 (!) 179/82     Systolic BP Percentile --      Diastolic BP Percentile --      Pulse Rate 12/19/23 0834 90     Resp 12/19/23 0834 20     Temp 12/19/23 0834 99.1 F (37.3 C)     Temp Source 12/19/23 0834 Oral     SpO2 12/19/23 0834 94 %     Weight --      Height --      Head Circumference --      Peak Flow --      Pain Score 12/19/23 0832 9     Pain Loc --      Pain Education --      Exclude from Growth Chart --    No data found.  Updated Vital Signs BP (!) 179/82 (BP Location: Right Arm)   Pulse 90   Temp 99.1 F (37.3 C) (Oral)   Resp 20   SpO2 94%   Visual Acuity Right Eye Distance:   Left Eye Distance:   Bilateral Distance:    Right Eye Near:   Left Eye Near:    Bilateral Near:     Physical Exam Constitutional:      Appearance: Normal appearance. She is normal weight.  HENT:     Nose: Nose normal.     Mouth/Throat:     Mouth: Mucous membranes are moist.  Cardiovascular:     Rate and Rhythm: Normal rate and regular rhythm.  Pulmonary:     Effort: Pulmonary effort is normal.     Breath sounds: Normal breath sounds.  Abdominal:     Palpations: Abdomen is soft.     Tenderness: There is no abdominal tenderness. There is no guarding or rebound.  Musculoskeletal:     Cervical back: Normal range of motion and neck supple.  Skin:    General: Skin is warm.  Neurological:     General: No focal deficit present.     Mental Status: She is alert.  Psychiatric:        Mood and Affect: Mood normal.  UC Treatments / Results  Labs (all labs ordered are listed, but only abnormal results are displayed) Labs Reviewed  POCT URINALYSIS DIP (MANUAL ENTRY) - Abnormal; Notable for the following components:      Result Value   Ketones, POC UA trace (5) (*)    Protein Ur, POC =30 (*)    Leukocytes, UA Trace (*)    All other components within normal limits  URINE CULTURE  POC COVID19/FLU A&B  COMBO    EKG   Radiology No results found.  Procedures Procedures (including critical care time)  Medications Ordered in UC Medications - No data to display  Initial Impression / Assessment and Plan / UC Course  I have reviewed the triage vital signs and the nursing notes.  Pertinent labs & imaging results that were available during my care of the patient were reviewed by me and considered in my medical decision making (see chart for details).  Final Clinical Impressions(s) / UC Diagnoses   Final diagnoses:  Fever, unspecified fever cause  Urinary urgency     Discharge Instructions      You were seen today for fever and body aches since this morning.  Your flu/covid swab was negative.  Your urine does not show obvious infection.  However, given your history I have sent out an antibiotic for possible UTI, and will culture the urine.  If the culture  is normal, you may be called to stop the antibiotic.  Please get rest and fluids.  Return if not improving.   ED Prescriptions     Medication Sig Dispense Auth. Provider   cephALEXin  (KEFLEX ) 500 MG capsule Take 1 capsule (500 mg total) by mouth 3 (three) times daily for 7 days. 21 capsule Lesle Ras, MD      PDMP not reviewed this encounter.   Lesle Ras, MD 12/19/23 [id number]    Lesle Ras, MD 12/19/23 [phone]

## 2023-12-20 LAB — URINE CULTURE

## 2023-12-22 ENCOUNTER — Ambulatory Visit (HOSPITAL_COMMUNITY): Payer: Self-pay

## 2024-01-07 ENCOUNTER — Encounter (HOSPITAL_COMMUNITY): Payer: Self-pay | Admitting: Emergency Medicine

## 2024-01-07 ENCOUNTER — Other Ambulatory Visit: Payer: Self-pay

## 2024-01-07 ENCOUNTER — Ambulatory Visit (HOSPITAL_COMMUNITY)
Admission: EM | Admit: 2024-01-07 | Discharge: 2024-01-07 | Disposition: A | Discharge status: Home / Self Care | Attending: Physician Assistant | Admitting: Physician Assistant

## 2024-01-07 DIAGNOSIS — B370 Candidal stomatitis: Secondary | ICD-10-CM

## 2024-01-07 MED ORDER — FLUCONAZOLE 150 MG PO TABS: 150.0000 mg | ORAL_TABLET | Freq: Once | ORAL | 0 refills | Status: AC

## 2024-01-07 MED ORDER — CLOTRIMAZOLE 10 MG MT TROC: 10.0000 mg | Freq: Every day | OROMUCOSAL | 0 refills | Status: DC

## 2024-01-07 NOTE — Discharge Instructions (Signed)
 Use the clotrimazole 5 times daily.  I have also called in a dose of Diflucan to help with your symptoms.  I recommend you gargle with warm salt water  and use over-the-counter medications for symptom management.  If you get your point that you cannot swallow, have severe sore throat, shortness of breath, decreased oral intake you need to be seen immediately.

## 2024-01-07 NOTE — ED Provider Notes (Signed)
 [facility]    CSN: [id number] Arrival date & time: 01/07/24  1258      History   Chief Complaint No chief complaint on file.   HPI Misty Morris is a 73 y.o. female.   Patient presents today with a several week history of white coating in her mouth that has led to xerostomia and mouth irritation.  She reports that symptoms began after she was seen and treated for a viral infection as well as taking antibiotic for potential UTI.  She is on chronic low-dose prednisone  due to history of rheumatoid and polymyalgia rheumatica and states that as a result of this she often gets thrush anytime that she is sick or has a new medication.  She denies any significant sore throat, dysphagia, fever, nausea, vomiting.  She had a leftover prescription for diphenhydramine , lidocaine , nystatin which she has been using 4 times a day for the past 10 days without improvement of symptoms.  She is eating and drinking normally despite symptoms.    Past Medical History:  Diagnosis Date   Cancer (HCC)    hx of thyroid  cancer   Fibromyalgia    Hypercholesterolemia    Hypertension    Hypothyroidism    thyroid  removed   Polymyalgia rheumatica (HCC)    Rheumatoid arthritis (HCC)    "atypical/MD 08/2017" (02/14/2018)   Shingles    Stroke (HCC) 08/2017   "no residual" (02/14/2018)   TIA (transient ischemic attack) 02/14/2018   Type 2 diabetes, diet controlled (HCC)    Vision impairment    right eye    Patient Active Problem List   Diagnosis Date Noted   UTI (urinary tract infection) 10/07/2018   Sepsis (HCC) 10/07/2018   Hypokalemia 10/07/2018   S/P total thyroidectomy 10/07/2018   Rheumatoid arthritis (HCC) 09/18/2018   Papillary thyroid  carcinoma (HCC) 08/24/2018   TIA (transient ischemic attack) 02/14/2018   Hyperlipidemia    Ischemic optic neuropathy of right eye    Stroke (HCC) 09/04/2017   HTN (hypertension) 09/04/2017   DM (diabetes mellitus) (HCC) 09/04/2017   Polymyalgia  rheumatica (HCC) 09/04/2017   Fibromyalgia 09/04/2017   S/P left UKR 01/17/2017   Overweight (BMI 25.0-29.9) 10/01/2015   S/P right TKA 09/30/2015    Past Surgical History:  Procedure Laterality Date   CATARACT EXTRACTION W/ INTRAOCULAR LENS  IMPLANT, BILATERAL     COLONOSCOPY WITH PROPOFOL  N/A 10/12/2019   Procedure: COLONOSCOPY WITH PROPOFOL ;  Surgeon: Alvis Jourdain, MD;  Location: [facility];  Service: Endoscopy;  Laterality: N/A;   COLONOSCOPY WITH PROPOFOL  N/A 12/24/2022   Procedure: COLONOSCOPY WITH PROPOFOL ;  Surgeon: Alvis Jourdain, MD;  Location: [facility];  Service: Gastroenterology;  Laterality: N/A;   HEMOSTASIS CLIP PLACEMENT  10/12/2019   Procedure: HEMOSTASIS CLIP PLACEMENT;  Surgeon: Alvis Jourdain, MD;  Location: [facility];  Service: Endoscopy;;   JOINT REPLACEMENT     OPEN REDUCTION LE FORT I FRACTURE  1980s   PARTIAL KNEE ARTHROPLASTY Left 01/17/2017   Procedure: UNICOMPARTMENTAL LEFT KNEE;  Surgeon: Claiborne Crew, MD;  Location: [facility];  Service: Orthopedics;  Laterality: Left;   POLYPECTOMY  10/12/2019   Procedure: POLYPECTOMY;  Surgeon: Alvis Jourdain, MD;  Location: [facility];  Service: Endoscopy;;   RADIOLOGY WITH ANESTHESIA N/A 08/11/2017   Procedure: MRI OF BRAIN WITH AND WITHOUT CONSTRAST, AND OF THE ORBIT WITH AND WITHOUT CONTRAST;  Surgeon: Radiologist, Medication, MD;  [facility];  Service: Radiology;  Laterality: N/A;   RADIOLOGY WITH ANESTHESIA N/A 09/06/2017  Procedure: MRI OF BRAIN;  Surgeon: Radiologist, Medication, MD;  [facility];  Service: Radiology;  Laterality: N/A;   RADIOLOGY WITH ANESTHESIA Right 03/03/2023   Procedure: MRI SHOULDER WITHOUT CONTRAST WITH ANESTHESIA;  Surgeon: Radiologist, Medication, MD;  [facility];  Service: Radiology;  Laterality: Right;   RHINOPLASTY     SHOULDER ARTHROSCOPY WITH ROTATOR CUFF REPAIR Right 04/25/2023   Procedure: RIGHT SHOULDER ARTHROSCOPY WITH ROTATOR CUFF REPAIR, ACROMIOPLASTY;  Surgeon:  Wilhelmenia Harada, MD;  [facility];  Service: Orthopedics;  Laterality: Right;   TOTAL KNEE ARTHROPLASTY Right 09/30/2015   Procedure: TOTAL RIGHT KNEE ARTHROPLASTY;  Surgeon: Claiborne Crew, MD;  Location: [facility];  Service: Orthopedics;  Laterality: Right;   VAGINAL HYSTERECTOMY      OB History   No obstetric history on file.      Home Medications    Prior to Admission medications   Medication Sig Start Date End Date Taking? Authorizing Provider  clotrimazole (MYCELEX) 10 MG troche Take 1 tablet (10 mg total) by mouth 5 (five) times daily. 01/07/24  Yes Daejon Lich K, PA-C  fluconazole (DIFLUCAN) 150 MG tablet Take 1 tablet (150 mg total) by mouth once for 1 dose. 01/07/24 01/07/24 Yes Keyarah Mcroy K, PA-C  ACETYLCARN-ALPHA LIPOIC ACID  PO Take 1 capsule by mouth 2 (two) times daily.    [provider]  amLODipine  (NORVASC ) 5 MG tablet Take 5 mg by mouth every evening.  08/20/15   [provider]  Calcium -Magnesium -Vitamin D (CALCIUM  1200+D3 PO) Take 1 tablet by mouth every evening.    [provider]  Cholecalciferol (VITAMIN D) 50 MCG (2000 UT) tablet Take 2,000 Units by mouth daily.    [provider]  Chromium  Picolinate 200 MCG CAPS Take 200 mcg by mouth daily.    [provider]  clopidogrel  (PLAVIX ) 75 MG tablet Take 1 tablet (75 mg total) by mouth daily. 02/16/18   Krishnan, Gokul, MD  Coenzyme Q10 (CO Q-10) 300 MG CAPS Take 300 mg by mouth every evening.    [provider]  diphenhydrAMINE  (BENADRYL ) 25 MG tablet Take 25 mg by mouth at bedtime.    [provider]  estradiol (ESTRACE) 0.1 MG/GM vaginal cream Place 1 Applicatorful vaginally 2 (two) times a week.    [provider]  ibuprofen  (ADVIL ) 200 MG tablet Take 400 mg by mouth every 6 (six) hours as needed for moderate pain or headache (about 4 or 5 time a week).    [provider]  levothyroxine  (SYNTHROID ) 88 MCG tablet Take 88 mcg by mouth daily  before breakfast.    [provider]  Lysine  600 MG TABS Take 600 mg by mouth every evening.    [provider]  moxifloxacin (VIGAMOX) 0.5 % ophthalmic solution Place 1 drop into the left eye daily.    [provider]  Omega-3 Fatty Acids (FISH OIL PO) Take 1,400 mg by mouth daily.    [provider]  Polyvinyl Alcohol-Povidone (REFRESH OP) Place 1 drop into the left eye See admin instructions. Middle of the night 0300 as needed    [provider]  predniSONE  (DELTASONE ) 1 MG tablet Take 4 mg by mouth every evening.    [provider]  rosuvastatin  (CRESTOR ) 20 MG tablet Take 1 tablet (20 mg total) by mouth daily at 6 PM. 02/15/18   Maylene Spear, MD  timolol  (TIMOPTIC ) 0.25 % ophthalmic solution Place 1 drop into both eyes daily.    [provider]  ZIOPTAN 0.0015 % SOLN Place 1 drop into both eyes at bedtime. 07/23/15   [provider]  zoledronic  acid (RECLAST ) 5 MG/100ML SOLN injection Inject 5 mg into the vein once. 02/22/23 Yearly    [provider]    Family History Family History  Problem Relation Age of Onset   Hypertension Mother    Seizures Mother    Hypertension Father    Hypertension Sister    Pancreatic cancer Other     Social History Social History   Tobacco Use   Smoking status: Former    Current packs/day: 0.00    Average packs/day: 1 pack/day for 25.0 years (25.0 ttl pk-yrs)    Types: Cigarettes    Start date: 12/27/1986    Quit date: 12/27/2011    Years since quitting: 12.0   Smokeless tobacco: Never  Vaping Use   Vaping status: Never Used  Substance Use Topics   Alcohol use: Never   Drug use: Not Currently    Types: Marijuana    Comment: last smoked 03/28/23     Allergies   Codeine, Demerol  [meperidine ], Latex, Morphine and codeine, Sulfa antibiotics, Tramadol, Vicodin [hydrocodone -acetaminophen ], and Adalimumab   Review of Systems Review of Systems  Constitutional:   Positive for activity change. Negative for appetite change, fatigue and fever.  HENT:  Positive for sore throat and trouble swallowing. Negative for congestion and voice change.   Respiratory:  Negative for shortness of breath.   Cardiovascular:  Negative for chest pain.  Gastrointestinal:  Negative for abdominal pain, diarrhea, nausea and vomiting.     Physical Exam Triage Vital Signs ED Triage Vitals  Encounter Vitals Group     BP 01/07/24 1325 (!) 152/78     Systolic BP Percentile --      Diastolic BP Percentile --      Pulse Rate 01/07/24 1325 77     Resp --      Temp 01/07/24 1325 98.2 F (36.8 C)     Temp Source 01/07/24 1325 Oral     SpO2 01/07/24 1325 95 %     Weight --      Height --      Head Circumference --      Peak Flow --      Pain Score 01/07/24 1322 0     Pain Loc --      Pain Education --      Exclude from Growth Chart --    No data found.  Updated Vital Signs BP (!) 152/78 (BP Location: Left Arm)   Pulse 77   Temp 98.2 F (36.8 C) (Oral)   SpO2 95%   Visual Acuity Right Eye Distance:   Left Eye Distance:   Bilateral Distance:    Right Eye Near:   Left Eye Near:    Bilateral Near:     Physical Exam Vitals reviewed.  Constitutional:      General: She is awake. She is not in acute distress.    Appearance: Normal appearance. She is well-developed. She is not ill-appearing.     Comments: Very pleasant female appears stated age in no acute distress sitting comfortably in exam room  HENT:     Head: Normocephalic and atraumatic.     Mouth/Throat:     Mouth: Mucous membranes are moist.     Pharynx: Uvula midline. Posterior oropharyngeal erythema present. No oropharyngeal exudate.     Comments: Mild erythema in posterior oropharynx with thick white plaque over posterior tongue. Cardiovascular:  Rate and Rhythm: Normal rate and regular rhythm.     Heart sounds: Normal heart sounds, S1 normal and S2 normal. No murmur heard. Pulmonary:      Effort: Pulmonary effort is normal.     Breath sounds: Normal breath sounds. No wheezing, rhonchi or rales.     Comments: Clear auscultation bilaterally Psychiatric:        Behavior: Behavior is cooperative.      UC Treatments / Results  Labs (all labs ordered are listed, but only abnormal results are displayed) Labs Reviewed - No data to display  EKG   Radiology No results found.  Procedures Procedures (including critical care time)  Medications Ordered in UC Medications - No data to display  Initial Impression / Assessment and Plan / UC Course  I have reviewed the triage vital signs and the nursing notes.  Pertinent labs & imaging results that were available during my care of the patient were reviewed by me and considered in my medical decision making (see chart for details).     Patient is well-appearing, afebrile, nontoxic, nontachycardic.  Concern for thrush given clinical presentation.  Will start clotrimazole torches as well as a dose of Diflucan given she is already used Magic mouthwash without improvement.  She was encouraged to gargle with warm salt water  for additional symptom relief.  If she is not feeling better within a few days or if anything worsens and she is swelling of her throat, shortness of breath, decreased oral intake she needs to be seen immediately.  Recommend close follow-up with her primary care.  Strict return precautions given.  All questions were answered to patient satisfaction.  Final Clinical Impressions(s) / UC Diagnoses   Final diagnoses:  Oral thrush     Discharge Instructions      Use the clotrimazole 5 times daily.  I have also called in a dose of Diflucan to help with your symptoms.  I recommend you gargle with warm salt water  and use over-the-counter medications for symptom management.  If you get your point that you cannot swallow, have severe sore throat, shortness of breath, decreased oral intake you need to be seen  immediately.   ED Prescriptions     Medication Sig Dispense Auth. Provider   clotrimazole (MYCELEX) 10 MG troche Take 1 tablet (10 mg total) by mouth 5 (five) times daily. 35 Troche Delroy Ordway K, PA-C   fluconazole (DIFLUCAN) 150 MG tablet Take 1 tablet (150 mg total) by mouth once for 1 dose. 1 tablet Onis Markoff K, PA-C      PDMP not reviewed this encounter.   Budd Cargo, PA-C 01/07/24 1349

## 2024-01-07 NOTE — ED Triage Notes (Signed)
 Was seen a few weeks ago 5/12.  Since then developed thrush and has taken diphen :lido:nyst:antacid from a prior episode.  Patient says symptoms have improved, but not completely.

## 2024-01-20 DIAGNOSIS — H10412 Chronic giant papillary conjunctivitis, left eye: Secondary | ICD-10-CM | POA: Diagnosis not present

## 2024-01-23 DIAGNOSIS — E890 Postprocedural hypothyroidism: Secondary | ICD-10-CM | POA: Diagnosis not present

## 2024-01-23 DIAGNOSIS — H18833 Recurrent erosion of cornea, bilateral: Secondary | ICD-10-CM | POA: Diagnosis not present

## 2024-01-23 DIAGNOSIS — C73 Malignant neoplasm of thyroid gland: Secondary | ICD-10-CM | POA: Diagnosis not present

## 2024-02-06 DIAGNOSIS — Z7952 Long term (current) use of systemic steroids: Secondary | ICD-10-CM | POA: Diagnosis not present

## 2024-02-06 DIAGNOSIS — K219 Gastro-esophageal reflux disease without esophagitis: Secondary | ICD-10-CM | POA: Diagnosis not present

## 2024-02-06 DIAGNOSIS — M797 Fibromyalgia: Secondary | ICD-10-CM | POA: Diagnosis not present

## 2024-02-06 DIAGNOSIS — M810 Age-related osteoporosis without current pathological fracture: Secondary | ICD-10-CM | POA: Diagnosis not present

## 2024-02-06 DIAGNOSIS — M1990 Unspecified osteoarthritis, unspecified site: Secondary | ICD-10-CM | POA: Diagnosis not present

## 2024-02-06 DIAGNOSIS — M2550 Pain in unspecified joint: Secondary | ICD-10-CM | POA: Diagnosis not present

## 2024-02-06 DIAGNOSIS — M353 Polymyalgia rheumatica: Secondary | ICD-10-CM | POA: Diagnosis not present

## 2024-02-13 DIAGNOSIS — H16102 Unspecified superficial keratitis, left eye: Secondary | ICD-10-CM | POA: Diagnosis not present

## 2024-02-27 DIAGNOSIS — M0579 Rheumatoid arthritis with rheumatoid factor of multiple sites without organ or systems involvement: Secondary | ICD-10-CM | POA: Diagnosis not present

## 2024-02-27 DIAGNOSIS — E781 Pure hyperglyceridemia: Secondary | ICD-10-CM | POA: Diagnosis not present

## 2024-02-27 DIAGNOSIS — E1159 Type 2 diabetes mellitus with other circulatory complications: Secondary | ICD-10-CM | POA: Diagnosis not present

## 2024-03-05 DIAGNOSIS — E890 Postprocedural hypothyroidism: Secondary | ICD-10-CM | POA: Diagnosis not present

## 2024-03-05 DIAGNOSIS — Z85850 Personal history of malignant neoplasm of thyroid: Secondary | ICD-10-CM | POA: Diagnosis not present

## 2024-03-05 DIAGNOSIS — M0579 Rheumatoid arthritis with rheumatoid factor of multiple sites without organ or systems involvement: Secondary | ICD-10-CM | POA: Diagnosis not present

## 2024-03-05 DIAGNOSIS — E1159 Type 2 diabetes mellitus with other circulatory complications: Secondary | ICD-10-CM | POA: Diagnosis not present

## 2024-03-05 DIAGNOSIS — M810 Age-related osteoporosis without current pathological fracture: Secondary | ICD-10-CM | POA: Diagnosis not present

## 2024-03-05 DIAGNOSIS — I10 Essential (primary) hypertension: Secondary | ICD-10-CM | POA: Diagnosis not present

## 2024-03-05 DIAGNOSIS — T1511XA Foreign body in conjunctival sac, right eye, initial encounter: Secondary | ICD-10-CM | POA: Diagnosis not present

## 2024-03-05 DIAGNOSIS — H16201 Unspecified keratoconjunctivitis, right eye: Secondary | ICD-10-CM | POA: Diagnosis not present

## 2024-03-05 DIAGNOSIS — E781 Pure hyperglyceridemia: Secondary | ICD-10-CM | POA: Diagnosis not present

## 2024-03-05 DIAGNOSIS — I6930 Unspecified sequelae of cerebral infarction: Secondary | ICD-10-CM | POA: Diagnosis not present

## 2024-03-05 DIAGNOSIS — Z7952 Long term (current) use of systemic steroids: Secondary | ICD-10-CM | POA: Diagnosis not present

## 2024-03-23 DIAGNOSIS — H401131 Primary open-angle glaucoma, bilateral, mild stage: Secondary | ICD-10-CM | POA: Diagnosis not present

## 2024-03-23 DIAGNOSIS — H04121 Dry eye syndrome of right lacrimal gland: Secondary | ICD-10-CM | POA: Diagnosis not present

## 2024-05-11 ENCOUNTER — Other Ambulatory Visit (HOSPITAL_COMMUNITY): Payer: Self-pay

## 2024-05-11 MED ORDER — FLUZONE HIGH-DOSE 0.5 ML IM SUSY
0.5000 mL | PREFILLED_SYRINGE | Freq: Once | INTRAMUSCULAR | 0 refills | Status: AC
Start: 2024-05-11 — End: 2024-05-12
  Filled 2024-05-11: qty 0.5, 1d supply, fill #0

## 2024-05-16 DIAGNOSIS — M797 Fibromyalgia: Secondary | ICD-10-CM | POA: Diagnosis not present

## 2024-05-16 DIAGNOSIS — Z7952 Long term (current) use of systemic steroids: Secondary | ICD-10-CM | POA: Diagnosis not present

## 2024-05-16 DIAGNOSIS — M1990 Unspecified osteoarthritis, unspecified site: Secondary | ICD-10-CM | POA: Diagnosis not present

## 2024-05-16 DIAGNOSIS — M353 Polymyalgia rheumatica: Secondary | ICD-10-CM | POA: Diagnosis not present

## 2024-05-16 DIAGNOSIS — M2550 Pain in unspecified joint: Secondary | ICD-10-CM | POA: Diagnosis not present

## 2024-05-16 DIAGNOSIS — K219 Gastro-esophageal reflux disease without esophagitis: Secondary | ICD-10-CM | POA: Diagnosis not present

## 2024-05-16 DIAGNOSIS — M810 Age-related osteoporosis without current pathological fracture: Secondary | ICD-10-CM | POA: Diagnosis not present

## 2024-06-01 ENCOUNTER — Other Ambulatory Visit: Payer: Self-pay

## 2024-06-01 ENCOUNTER — Other Ambulatory Visit (HOSPITAL_COMMUNITY): Payer: Self-pay

## 2024-06-01 ENCOUNTER — Ambulatory Visit (HOSPITAL_COMMUNITY): Admission: EM | Admit: 2024-06-01 | Discharge: 2024-06-01 | Disposition: A | Discharge status: Home / Self Care

## 2024-06-01 ENCOUNTER — Encounter (HOSPITAL_COMMUNITY): Payer: Self-pay | Admitting: *Deleted

## 2024-06-01 DIAGNOSIS — B370 Candidal stomatitis: Secondary | ICD-10-CM

## 2024-06-01 MED ORDER — CLOTRIMAZOLE 10 MG MT TROC
10.0000 mg | Freq: Every day | OROMUCOSAL | 0 refills | Status: AC
Start: 2024-06-01 — End: 2024-06-11
  Filled 2024-06-01: qty 50, 10d supply, fill #0

## 2024-06-01 NOTE — Discharge Instructions (Addendum)
  1. Oral thrush (Primary) - clotrimazole  (MYCELEX ) 10 MG troche; Take 1 tablet (10 mg total) by mouth 5 (five) times daily for 10 days.  Dispense: 50 tablet; Refill: 0 -Continue to monitor symptoms for any change in severity if there is any escalation of current symptoms or development of new symptoms follow-up in ER for further evaluation and management.

## 2024-06-01 NOTE — ED Triage Notes (Addendum)
 PT reports she has thrush and was treated by PCP with out relief. Pt here to day for same treatment lozenes she received on the last visit . Pt took Mycelex  that she took 12/2023 visit.

## 2024-06-01 NOTE — ED Provider Notes (Signed)
 [facility]  Note:  This document was prepared using Conservation officer, historic buildings and may include unintentional dictation errors.  MRN: [id number] DOB: 1951-01-19  Subjective:   KAYTLYN DIN is a 73 y.o. female presenting for concern for possible oral thrush.  Patient reports that she takes prednisone  daily for polymyalgia rheumatica and states that it causes her to have a suppressed immune system.  Patient reports last case of oral thrush was approximately a year ago where patient was treated with clotrimazole  lozenges which she states helped relieve symptoms almost immediately.  Patient reports that she called her primary care provider for prescription for clotrimazole  lozenges however they prescribed Diflucan  150 mg tablet instead of oral lozenges.  Patient is here requesting prescription for clotrimazole  lozenges to treat her suspected yeast infection.  No current facility-administered medications for this encounter.  Current Outpatient Medications:    amLODipine  (NORVASC ) 5 MG tablet, Take 5 mg by mouth every evening. , Disp: , Rfl: 2   Calcium -Magnesium -Vitamin D (CALCIUM  1200+D3 PO), Take 1 tablet by mouth every evening., Disp: , Rfl:    Cholecalciferol (VITAMIN D) 50 MCG (2000 UT) tablet, Take 2,000 Units by mouth daily., Disp: , Rfl:    Chromium  Picolinate 200 MCG CAPS, Take 200 mcg by mouth daily., Disp: , Rfl:    clopidogrel  (PLAVIX ) 75 MG tablet, Take 1 tablet (75 mg total) by mouth daily., Disp: 30 tablet, Rfl: 3   diphenhydrAMINE  (BENADRYL ) 25 MG tablet, Take 25 mg by mouth at bedtime., Disp: , Rfl:    estradiol (ESTRACE) 0.1 MG/GM vaginal cream, Place 1 Applicatorful vaginally 2 (two) times a week., Disp: , Rfl:    ibuprofen  (ADVIL ) 200 MG tablet, Take 400 mg by mouth every 6 (six) hours as needed for moderate pain or headache (about 4 or 5 time a week)., Disp: , Rfl:    levothyroxine  (SYNTHROID ) 88 MCG tablet, Take 88 mcg by mouth daily before breakfast.,  Disp: , Rfl:    Lysine  600 MG TABS, Take 600 mg by mouth every evening., Disp: , Rfl:    moxifloxacin (VIGAMOX) 0.5 % ophthalmic solution, Place 1 drop into the left eye daily., Disp: , Rfl:    Omega-3 Fatty Acids (FISH OIL PO), Take 1,400 mg by mouth daily., Disp: , Rfl:    Polyvinyl Alcohol-Povidone (REFRESH OP), Place 1 drop into the left eye See admin instructions. Middle of the night 0300 as needed, Disp: , Rfl:    predniSONE  (DELTASONE ) 1 MG tablet, Take 4 mg by mouth every evening., Disp: , Rfl:    rosuvastatin  (CRESTOR ) 20 MG tablet, Take 1 tablet (20 mg total) by mouth daily at 6 PM., Disp: 30 tablet, Rfl: 3   ZIOPTAN 0.0015 % SOLN, Place 1 drop into both eyes at bedtime., Disp: , Rfl: 3   ACETYLCARN-ALPHA LIPOIC ACID  PO, Take 1 capsule by mouth 2 (two) times daily., Disp: , Rfl:    clotrimazole  (MYCELEX ) 10 MG troche, Take 1 tablet (10 mg total) by mouth 5 (five) times daily for 10 days., Disp: 50 tablet, Rfl: 0   Coenzyme Q10 (CO Q-10) 300 MG CAPS, Take 300 mg by mouth every evening., Disp: , Rfl:    timolol  (TIMOPTIC ) 0.25 % ophthalmic solution, Place 1 drop into both eyes daily., Disp: , Rfl:    zoledronic  acid (RECLAST ) 5 MG/100ML SOLN injection, Inject 5 mg into the vein once. 02/22/23 Yearly, Disp: , Rfl:    Allergies  Allergen Reactions   Codeine Hives   Demerol  [  Meperidine ] Hives   Latex Dermatitis and Other (See Comments)    Blistering  Other Reaction(s): Unknown   Morphine And Codeine Hives   Sulfa Antibiotics Hives   Tramadol Hives   Vicodin [Hydrocodone -Acetaminophen ] Itching    Elevated blood pressure. Tolerates low doses.   Adalimumab     Other Reaction(s): hair loss    Past Medical History:  Diagnosis Date   Cancer (HCC)    hx of thyroid  cancer   Fibromyalgia    Hypercholesterolemia    Hypertension    Hypothyroidism    thyroid  removed   Polymyalgia rheumatica    Rheumatoid arthritis (HCC)    atypical/MD 08/2017 (02/14/2018)   Shingles    Stroke (HCC)  08/2017   no residual (02/14/2018)   TIA (transient ischemic attack) 02/14/2018   Type 2 diabetes, diet controlled (HCC)    Vision impairment    right eye     Past Surgical History:  Procedure Laterality Date   CATARACT EXTRACTION W/ INTRAOCULAR LENS  IMPLANT, BILATERAL     COLONOSCOPY WITH PROPOFOL  N/A 10/12/2019   Procedure: COLONOSCOPY WITH PROPOFOL ;  Surgeon: Rollin Dover, MD;  [facility];  Service: Endoscopy;  Laterality: N/A;   COLONOSCOPY WITH PROPOFOL  N/A 12/24/2022   Procedure: COLONOSCOPY WITH PROPOFOL ;  Surgeon: Rollin Dover, MD;  Location[facility];  Service: Gastroenterology;  Laterality: N/A;   HEMOSTASIS CLIP PLACEMENT  10/12/2019   Procedure: HEMOSTASIS CLIP PLACEMENT;  Surgeon: Rollin Dover, MD;  [facility];  Service: Endoscopy;;   JOINT REPLACEMENT     OPEN REDUCTION LE FORT I FRACTURE  1980s   PARTIAL KNEE ARTHROPLASTY Left 01/17/2017   Procedure: UNICOMPARTMENTAL LEFT KNEE;  Surgeon: Ernie Cough, MD;  Location: [facility];  Service: Orthopedics;  Laterality: Left;   POLYPECTOMY  10/12/2019   Procedure: POLYPECTOMY;  Surgeon: Rollin Dover, MD;  [facility];  Service: Endoscopy;;   RADIOLOGY WITH ANESTHESIA N/A 08/11/2017   Procedure: MRI OF BRAIN WITH AND WITHOUT CONSTRAST, AND OF THE ORBIT WITH AND WITHOUT CONTRAST;  Surgeon: Radiologist, Medication, MD;  [facility];  Service: Radiology;  Laterality: N/A;   RADIOLOGY WITH ANESTHESIA N/A 09/06/2017   Procedure: MRI OF BRAIN;  Surgeon: Radiologist, Medication, MD;  [facility];  Service: Radiology;  Laterality: N/A;   RADIOLOGY WITH ANESTHESIA Right 03/03/2023   Procedure: MRI SHOULDER WITHOUT CONTRAST WITH ANESTHESIA;  Surgeon: Radiologist, Medication, MD;  [facility];  Service: Radiology;  Laterality: Right;   RHINOPLASTY     SHOULDER ARTHROSCOPY WITH ROTATOR CUFF REPAIR Right 04/25/2023   Procedure: RIGHT SHOULDER ARTHROSCOPY WITH ROTATOR CUFF REPAIR, ACROMIOPLASTY;   Surgeon: Genelle Standing, MD;  [facility];  Service: Orthopedics;  Laterality: Right;   TOTAL KNEE ARTHROPLASTY Right 09/30/2015   Procedure: TOTAL RIGHT KNEE ARTHROPLASTY;  Surgeon: Cough Ernie, MD;  Location: [facility];  Service: Orthopedics;  Laterality: Right;   VAGINAL HYSTERECTOMY      Family History  Problem Relation Age of Onset   Hypertension Mother    Seizures Mother    Hypertension Father    Hypertension Sister    Pancreatic cancer Other     Social History   Tobacco Use   Smoking status: Former    Current packs/day: 0.00    Average packs/day: 1 pack/day for 25.0 years (25.0 ttl pk-yrs)    Types: Cigarettes    Start date: 12/27/1986    Quit date: 12/27/2011    Years since quitting: 12.4   Smokeless tobacco: Never  Vaping  Use   Vaping status: Never Used  Substance Use Topics   Alcohol use: Never   Drug use: Not Currently    Types: Marijuana    Comment: last smoked 03/28/23    ROS Refer to HPI for ROS details.  Objective:    Vitals: BP (!) 165/75   Pulse 68   Temp 97.9 F (36.6 C)   Resp 18   SpO2 94%   Physical Exam Vitals and nursing note reviewed.  Constitutional:      General: She is not in acute distress.    Appearance: Normal appearance. She is well-developed. She is not ill-appearing or toxic-appearing.  HENT:     Head: Normocephalic and atraumatic.     Mouth/Throat:     Lips: Pink. No lesions.     Mouth: Mucous membranes are moist.     Tongue: Lesions (Moderate leukoplakia consistent with oral thrush) present.   Cardiovascular:     Rate and Rhythm: Normal rate.  Pulmonary:     Effort: Pulmonary effort is normal. No respiratory distress.  Musculoskeletal:        General: Normal range of motion.  Skin:    General: Skin is warm and dry.  Neurological:     General: No focal deficit present.     Mental Status: She is alert and oriented to person, place, and time.  Psychiatric:        Mood and Affect: Mood normal.        Behavior:  Behavior normal.     Procedures  No results found for this or any previous visit (from the past 24 hours).  Assessment and Plan :     Discharge Instructions       1. Oral thrush (Primary) - clotrimazole  (MYCELEX ) 10 MG troche; Take 1 tablet (10 mg total) by mouth 5 (five) times daily for 10 days.  Dispense: 50 tablet; Refill: 0 -Continue to monitor symptoms for any change in severity if there is any escalation of current symptoms or development of new symptoms follow-up in ER for further evaluation and management.      Mariateresa Batra B Michayla Mcneil   [place]on [place], [place], [place] 06/01/24 [phone]

## 2024-06-11 ENCOUNTER — Encounter: Payer: Self-pay | Admitting: Radiology

## 2024-06-19 DIAGNOSIS — Z7952 Long term (current) use of systemic steroids: Secondary | ICD-10-CM | POA: Diagnosis not present

## 2024-06-19 DIAGNOSIS — B370 Candidal stomatitis: Secondary | ICD-10-CM | POA: Diagnosis not present

## 2024-06-27 DIAGNOSIS — Z7952 Long term (current) use of systemic steroids: Secondary | ICD-10-CM | POA: Diagnosis not present

## 2024-06-27 DIAGNOSIS — M2550 Pain in unspecified joint: Secondary | ICD-10-CM | POA: Diagnosis not present

## 2024-06-27 DIAGNOSIS — M810 Age-related osteoporosis without current pathological fracture: Secondary | ICD-10-CM | POA: Diagnosis not present

## 2024-06-27 DIAGNOSIS — M353 Polymyalgia rheumatica: Secondary | ICD-10-CM | POA: Diagnosis not present

## 2024-06-27 DIAGNOSIS — M1990 Unspecified osteoarthritis, unspecified site: Secondary | ICD-10-CM | POA: Diagnosis not present

## 2024-06-27 DIAGNOSIS — K219 Gastro-esophageal reflux disease without esophagitis: Secondary | ICD-10-CM | POA: Diagnosis not present

## 2024-06-27 DIAGNOSIS — M797 Fibromyalgia: Secondary | ICD-10-CM | POA: Diagnosis not present

## 2024-06-29 ENCOUNTER — Encounter: Payer: Self-pay | Admitting: Podiatry

## 2024-06-29 ENCOUNTER — Ambulatory Visit: Admitting: Podiatry

## 2024-06-29 DIAGNOSIS — B351 Tinea unguium: Secondary | ICD-10-CM

## 2024-06-29 DIAGNOSIS — L600 Ingrowing nail: Secondary | ICD-10-CM | POA: Diagnosis not present

## 2024-06-29 MED ORDER — TAVABOROLE 5 % EX SOLN: 1.0000 | Freq: Every day | CUTANEOUS | 2 refills | Status: AC

## 2024-06-29 NOTE — Patient Instructions (Addendum)
 Tavaborole  Topical solution What is this medication? TAVABOROLE  (ta va BO role) treats fungal infections of the nails. It belongs to a group of medications called antifungals. It will not treat infections caused by bacteria or viruses. This medicine may be used for other purposes; ask your health care provider or pharmacist if you have questions. COMMON BRAND NAME(S): KERYDIN  What should I tell my care team before I take this medication? They need to know if you have any of these conditions: An unusual or allergic reaction to tavaborole , other medications, foods, dyes, or preservatives Pregnant or trying to get pregnant Breast-feeding How should I use this medication? This medication is for external use only. Do not take by mouth. Wash your hands before and after use. If you are treating your hands, only wash your hands before use. Do not get it in your eyes. If you do, rinse your eyes with plenty of cool tap water. Use it as directed on the prescription label. Do not use it more often than directed. Use the medication for the full course as directed by your care team, even if you think you are better. Do not stop using it unless your care team tells you to stop it early. Apply a thin film of the medication to the affected area. Talk to your care team about the use of this medication in children. While it may be prescribed for children as young as 6 years for selected conditions, precautions do apply. Overdosage: If you think you have taken too much of this medicine contact a poison control center or emergency room at once. NOTE: This medicine is only for you. Do not share this medicine with others. What if I miss a dose? If you miss a dose, use it as soon as you can. If it is almost time for your next dose, use only that dose. Do not use double or extra doses. What may interact with this medication? Interactions have not been studied. Do not use any other nail products (i.e., nail polish,  pedicures) during treatment with this medication. This list may not describe all possible interactions. Give your health care provider a list of all the medicines, herbs, non-prescription drugs, or dietary supplements you use. Also tell them if you smoke, drink alcohol, or use illegal drugs. Some items may interact with your medicine. What should I watch for while using this medication? Visit your care team for regular checks on your progress. It may be some time before you see the benefit from this medication. After bathing, make sure your skin is very dry. Fungal infections like moist conditions. Do not walk around barefoot. To help prevent reinfection, wear freshly washed cotton, not synthetic, clothing. Tell your care team if you develop sores or blisters that do not heal properly. If your skin infection returns after you stop using this medication, contact your care team. What side effects may I notice from receiving this medication? Side effects that you should report to your care team as soon as possible: Allergic reactions--skin rash, itching, hives, swelling of the face, lips, tongue, or throat Burning, itching, crusting, or peeling of treated skin Side effects that usually do not require medical attention (report to your care team if they continue or are bothersome): Ingrown nails Mild skin irritation, redness, or dryness This list may not describe all possible side effects. Call your doctor for medical advice about side effects. You may report side effects to FDA at 1-800-FDA-1088. Where should I keep my medication? Keep out  of the reach of children and pets. Store at room temperature between 20 and 25 degrees C (68 and 77 degrees F). Keep this medication in the original container. Protect from moisture. Keep the container tightly closed. Avoid exposure to extreme heat. Get rid of any unused medication 3 months after opening. This medication is flammable. Avoid exposure to heat, fire,  flame, and smoking. To get rid of medications that are no longer needed or have expired: Take the medications to a medication take-back program. Check with your pharmacy or law enforcement to find a location. If you cannot return the medication, check the label or package insert to see if the medication should be thrown out in the garbage or flushed down the toilet. If you are not sure, ask your care team. If it is safe to put it in the trash, empty the medication out of the container. Mix the medication with cat litter, dirt, coffee grounds, or other unwanted substance. Seal the mixture in a bag or container. Put it in the trash. NOTE: This sheet is a summary. It may not cover all possible information. If you have questions about this medicine, talk to your doctor, pharmacist, or health care provider.  2024 Elsevier/Gold Standard (2021-11-12 00:00:00)

## 2024-07-02 NOTE — Progress Notes (Signed)
  Subjective:  Patient ID: Misty Morris, female    DOB: November 14, 1950,  MRN: [id number]  Chief Complaint  Patient presents with   Ingrown Toenail    possible ingrown and toenail fungus. Right foot medial border. 2 pain with pressure. Diet control diabetes. A1C 6.3.    Discussed the use of AI scribe software for clinical note transcription with the patient, who gave verbal consent to proceed.  History of Present Illness Misty Morris is a 73 year old female with diabetes who presents with toenail problems, including fungus and possible ingrown toenails.  She experiences toenail fungus and possible ingrown toenails. Castor oil has nearly cleared the fungus on one toe and is improving on two smaller toes. She is concerned about a sore area on one toe, suspecting an ingrown toenail, with no drainage or signs of infection. Discomfort occurs when the toe touches something, leading her to wear sneakers with a wide toe bed.  She takes Plavix  and is diabetic, with blood sugar generally stable on 4 mg prednisone  daily, though currently on a higher dose. Her mother had severe ingrown toenails, influencing her concern. She has stopped pedicures due to the fungus and trims her nails herself, using an emery board to manage thickness.  She chooses shoes with a wide toe bed to prevent irritation and avoid falls post-knee surgery. She regularly massages her feet and uses a wooden roller to promote circulation, emphasizing foot care due to her diabetic status.      Objective:  There were no vitals filed for this visit.  Physical Exam General: AAO x3, NAD  Dermatological: There is appear to be hypertrophic, dystrophic with yellow, brown discoloration.  Mild incurvation present to the hallux toenail without any signs of infection.  There is no hyperpigmentation.  There is no edema, erythema or signs of infection.  No open lesions are identified at this time.  Vascular: Dorsalis Pedis artery and Posterior  Tibial artery pedal pulses are 2/4 bilateral with immedate capillary fill time. There is no pain with calf compression, swelling, warmth, erythema.   Neruologic: Grossly intact via light touch bilateral.   Musculoskeletal: No significant pain on exam.     No images are attached to the encounter.    Results Procedure: Nail filing   Description: Nails were debrided using a grinder to enhance the penetration of topical medication.   Assessment:   1. Ingrown toenail   2. Dermatophytosis of nail      Plan:  Patient was evaluated and treated and all questions answered.  Assessment and Plan Assessment & Plan Onychomycosis (nail fungus) Chronic onychomycosis with residual infection. Prefers topical treatment due to medication burden and potential liver side effects of oral terbinafine. - Ordered topical keratin from specialty pharmacy ([facility]) - Instructed to apply keratin daily, ensuring absorption. - Advised to file down nails to enhance absorption. - Discussed oral terbinafine and laser therapy, but she prefers topical treatment.  Ingrown toenail, right great toe Mild ingrown toenail with soreness and inflammation. No infection present. - Trimmed corner of ingrown toenail to alleviate discomfort. - Advised to continue wearing wide toe bed shoes. - Instructed to monitor for infection signs such as swelling, redness, or drainage. - Discussed potential procedure if condition worsens, including numbing, cleaning, and phenol application.    Return in about 6 months (around 12/27/2024) for ingrown toenail, nail fungus.   Donnice JONELLE Fees DPM

## 2024-07-19 DIAGNOSIS — H16102 Unspecified superficial keratitis, left eye: Secondary | ICD-10-CM | POA: Diagnosis not present

## 2024-12-27 ENCOUNTER — Encounter: Admitting: Podiatry
